# Patient Record
Sex: Male | Born: 1960 | Race: White | Hispanic: No | Marital: Single
Health system: Southern US, Community
[De-identification: ages and names within clinical notes are randomized; demographics above are authoritative.]

## PROBLEM LIST (undated history)

## (undated) DIAGNOSIS — Z89512 Acquired absence of left leg below knee: Principal | ICD-10-CM

## (undated) DIAGNOSIS — Z86711 Personal history of pulmonary embolism: Secondary | ICD-10-CM

## (undated) DIAGNOSIS — E785 Hyperlipidemia, unspecified: Secondary | ICD-10-CM

## (undated) DIAGNOSIS — F419 Anxiety disorder, unspecified: Secondary | ICD-10-CM

## (undated) DIAGNOSIS — F5101 Primary insomnia: Secondary | ICD-10-CM

## (undated) DIAGNOSIS — M21962 Unspecified acquired deformity of left lower leg: Secondary | ICD-10-CM

## (undated) DIAGNOSIS — D6851 Activated protein C resistance: Secondary | ICD-10-CM

## (undated) DIAGNOSIS — Z1211 Encounter for screening for malignant neoplasm of colon: Secondary | ICD-10-CM

## (undated) DIAGNOSIS — I10 Essential (primary) hypertension: Secondary | ICD-10-CM

## (undated) DIAGNOSIS — F32A Depression, unspecified: Secondary | ICD-10-CM

## (undated) DIAGNOSIS — G4730 Sleep apnea, unspecified: Secondary | ICD-10-CM

## (undated) DIAGNOSIS — Q431 Hirschsprung's disease: Secondary | ICD-10-CM

## (undated) DIAGNOSIS — I82409 Acute embolism and thrombosis of unspecified deep veins of unspecified lower extremity: Secondary | ICD-10-CM

## (undated) DIAGNOSIS — R41844 Frontal lobe and executive function deficit: Secondary | ICD-10-CM

## (undated) DIAGNOSIS — M1990 Unspecified osteoarthritis, unspecified site: Secondary | ICD-10-CM

## (undated) DIAGNOSIS — F329 Major depressive disorder, single episode, unspecified: Secondary | ICD-10-CM

## (undated) DIAGNOSIS — T07XXXA Unspecified multiple injuries, initial encounter: Secondary | ICD-10-CM

## (undated) DIAGNOSIS — A4902 Methicillin resistant Staphylococcus aureus infection, unspecified site: Secondary | ICD-10-CM

## (undated) DIAGNOSIS — M25872 Other specified joint disorders, left ankle and foot: Secondary | ICD-10-CM

## (undated) HISTORY — DX: Sleep apnea, unspecified: G47.30

## (undated) HISTORY — DX: Essential (primary) hypertension: I10

## (undated) HISTORY — DX: Hirschsprung's disease: Q43.1

## (undated) HISTORY — PX: KNEE ARTHROPLASTY: SHX992

## (undated) HISTORY — DX: Major depressive disorder, single episode, unspecified: F32.9

## (undated) HISTORY — PX: HIP SURGERY: SHX245

## (undated) HISTORY — DX: Depression, unspecified: F32.A

## (undated) HISTORY — PX: COLONOSCOPY: SHX174

## (undated) HISTORY — DX: Unspecified multiple injuries, initial encounter: T07.XXXA

## (undated) HISTORY — PX: KNEE ARTHROSCOPY: SUR90

## (undated) HISTORY — PX: COLON SURGERY: SHX602

---

## 2007-03-07 ENCOUNTER — Ambulatory Visit (HOSPITAL_BASED_OUTPATIENT_CLINIC_OR_DEPARTMENT_OTHER): Admission: RE | Admit: 2007-03-07 | Discharge: 2007-03-07 | Payer: Self-pay | Admitting: Orthopaedic Surgery

## 2007-04-18 ENCOUNTER — Ambulatory Visit (HOSPITAL_BASED_OUTPATIENT_CLINIC_OR_DEPARTMENT_OTHER): Admission: RE | Admit: 2007-04-18 | Discharge: 2007-04-18 | Payer: Self-pay | Admitting: Orthopaedic Surgery

## 2007-07-31 ENCOUNTER — Inpatient Hospital Stay (HOSPITAL_COMMUNITY): Admission: RE | Admit: 2007-07-31 | Discharge: 2007-08-03 | Payer: Self-pay | Admitting: Orthopedic Surgery

## 2010-06-09 NOTE — Discharge Summary (Signed)
NAMEIMRAN, NUON                ACCOUNT NO.:  [id number]   MEDICAL RECORD NO.:  [id number]          PATIENT TYPE:  INP   LOCATION:  1617                         FACILITY:  [facility]   PHYSICIAN:  Madlyn Frankel. Charlann Boxer, M.D.  DATE OF BIRTH:  09/10/1960   DATE OF ADMISSION:  07/31/2007  DATE OF DISCHARGE:                               DISCHARGE SUMMARY   ADMISSION DIAGNOSES:  1. Osteoarthritis.  2. Factor V deficiency three.  3. Hypertension.  4. Hypercholesteremia.   DISCHARGE DIAGNOSIS:  1. Osteoarthritis.  2. Factor V deficiency.  3. Hypertension.  4. Hypercholesteremia.   HISTORY OF PRESENT ILLNESS:  [name] is a 50 year old male with a  history of bilateral knee pain secondary to osteoarthritis.  It is  refractory to all conservative treatments including oral anti-  inflammatories, cortisone injection and knee arthroscopic surgery.  He  does have a significant history of factor V deficiency and he has been  on chronic Coumadin.   CONSULTATIONS:  None.   PROCEDURE:  Bilateral total knee arthroplasty by surgeon Dr. Durene Romans.  Assistant [employer] PA-C.   LABORATORY DATA:  CBC upon discharge:  His hemoglobin was 11.2,  hematocrit 33, platelets 186.  Chemistries:  Sodium 136, potassium 4.1,  creatinine 0.76, glucose 120.  INR 1.8.  All these lab results from the  8th for CBC and CMET and on the 9th for his INR.  Calcium was 8.5 prior  to discharge.   RADIOLOGY:  Chest two-view showed mild chronic bronchitic changes.  No  active chest disease.   CARDIOLOGY:  No EKG found on chart.   HOSPITAL COURSE:  The patient admitted to the hospital, underwent  bilateral total knee replacement, admitted to orthopedic floor.  He had  increasing quad pain on day 1.  We switched his pain meds over from a  PCA which he had been on the night before to OxyContin as well as  oxycodone and gave him some Valium to help with muscle spasm as well as  anxiety.  This seemed to help tremendously.   He remained afebrile  throughout his course of stay.  Hemodynamically he did have a drop in  his hemoglobin but was stable and did not have any hypotensive  complications during his physical therapy.  He made a little bit of  progress with physical therapy and is very motivated to do well.  Seen  on day 3, his dressing were changed.  He had some swelling appropriate  at this time but no sign of infection with increased quad pain.  Otherwise was doing well with his physical therapy, motivated to get  better, with no sign of infection, afebrile, hemodynamically stable and  neurovascularly intact of his bilateral lower extremities.   DISCHARGE DISPOSITION:  Discharged to skilled nursing facility rehab in  stable and improved condition.   Discharge physical therapy:  Goals of physical therapy are range of  motion 0-100 at 2 weeks and 0-120 at 6 weeks.  Want to minimize pain,  maximize strength, encourage independence in activities of daily living.  He is on the use  a rolling walker for 2-3 weeks until he is able to  utilize a cane for ambulation.   DISCHARGE DIET:  Regular as tolerated by the patient.   DISCHARGE WOUND CARE:  Keep wounds dry.  If he would like to shower,  wrap with plastic, tape edges.   DISCHARGE MEDICATIONS:  1. Coumadin, maintain INR between 2 to 3.  2. Robaxin 500 mg one p.o. q.6 h. muscle spasm pain p.r.n.  3. Valium 5 mg one p.o. q.8 h. p.r.n. muscle spasm pain in      substitution for Robaxin, can give at night to help with sleep.  4. Iron 325 mg one p.o. t.i.d. x2 weeks as tolerated by the patient.      He does have a history of resection, may not be able to tolerate.  5. OxyContin IR 10 mg one p.o. b.i.d.  6. Oxycodone 5 mg one to three p.o. q.3-4 h. p.r.n. pain.  7. Amlodipine 5 mg p.o. daily.  8. Abilify 5 mg p.o. daily.  9. Benicar 20 mg p.o. daily.  10.Seroquel 150 mg p.o. nightly.  11.Zocor 40 mg p.o. daily.  12.Effexor 75 mg p.o. daily.   DISCHARGE  FOLLOWUP:  Follow up with Dr. Charlann Boxer at phone number [phone] in  2 weeks for wound check.     ______________________________  Yetta Glassman. [address], [place]      Madlyn Frankel. Charlann Boxer, M.D.  Electronically Signed    BLM/MEDQ  D:  08/03/2007  T:  08/03/2007  Job:  [id number]

## 2010-06-09 NOTE — Op Note (Signed)
[name], [name]                ACCOUNT NO.:  [id number]   MEDICAL RECORD NO.:  [id number]          PATIENT TYPE:  AMB   LOCATION:  DSC                          FACILITY:  [facility]   PHYSICIAN:  Vanita Panda. Magnus Ivan, M.D.DATE OF BIRTH:  Jan 04, 1961   DATE OF PROCEDURE:  04/18/2007  DATE OF DISCHARGE:                               OPERATIVE REPORT   PREOPERATIVE DIAGNOSIS:  Internal derangement, left knee.   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   PROCEDURE:  Left knee arthroscopy with debridement and chondroplasty of  medial femoral condyle, trochlea and patella.   SURGEON:  Vanita Panda. Magnus Ivan, MD   ANESTHESIA:  General.   BLOOD LOSS:  Minimal.   COMPLICATIONS:  None.   INDICATIONS:  Briefly, Mr. Espin is a 50 year old gentleman who is  morbidly obese.  He has had previous arthroscopic surgery several years  ago on his left knee.  He continued to have pain in this knee with  documented severe arthritis of his right knee.  We are trying to  temporize him for the need for any kind of total knee surgery.  His knee  pain has been worse on the left side.  We have tried injections in this  to help things calm down, and he is at the point where he is requesting  of further surgery.  The risks and benefits were explained to him at  length and he was having mechanical symptoms, and it was felt that we  could proceed along with a knee arthroscopy.Marland Kitchen   PROCEDURE DESCRIPTION:  After informed consent was obtained, the  appropriate left leg was marked.  He was brought to the operating room,  placed supine on the operating table.  General anesthesia was then  obtained.  A nonsterile tourniquet was placed around his upper left  thigh but was never utilized during the case.  His left leg was prepped  and draped with DuraPrep and sterile drapes including a sterile  stockinette.  With the bed raised, a lateral leg post  was utilized and  the leg was flexed off the side of the table.  A time-out was called and  used to identify the correct patient and correct extremity.  I then made  anterior lateral portal just lateral to the patellar tendon at the level  of the inferior pole of the patella and the arthroscope was inserted.  A  large effusion was encountered.  I went directly to the medial side and  made an anterior medial arthroscopy portal.  Through this portal I was  able to probe the medial compartment and under direct visualization we  could see that he had previous surgery on this knee and there was a  large cartilage deficit on the medial femoral condyle.  There appeared  to be fibrinous tissue from previous surgery.  I was able to place an  arthroscopic shaver and debride this to free margins.  The meniscus was  probed itself and it did have its posterior horn attached and the  meniscus itself was intact, so I left  this alone.  The ACL was intact as  well as the lateral compartment of his knee.  When I did go to the  trochlear groove, there were free areas of cartilage that were friable  on the trochlear groove and patella, and I used an arthroscopic shaver  to debride these back to stable margins as well.  I then allowed fluid  to lavage the knee and I closed the portal sites with interrupted 4-0  nylon suture after removing the effusion from the knee.  I then inserted  a mixture of Marcaine and morphine in the knee, which he tolerated well.  Each portal site was closed again with a 4-0 nylon suture,  Xeroform followed by a well-padded sterile dressing and ice pack was  placed on the knee.  The patient was awakened, extubated, and taken to  the recovery room in stable condition.  All final counts were correct  and there were no complications noted.      Vanita Panda. Magnus Ivan, M.D.  Electronically Signed     CYB/MEDQ  D:  04/18/2007  T:  04/18/2007  Job:  [id number]

## 2010-06-09 NOTE — Op Note (Signed)
NAMEPERRI, LAMAGNA                ACCOUNT NO.:  [id number]   MEDICAL RECORD NO.:  [id number]          PATIENT TYPE:  AMB   LOCATION:  DSC                          FACILITY:  [facility]   PHYSICIAN:  Vanita Panda. Magnus Ivan, M.D.DATE OF BIRTH:  27-Sep-1960   DATE OF PROCEDURE:  03/07/2007  DATE OF DISCHARGE:                               OPERATIVE REPORT   PREOPERATIVE DIAGNOSIS:  Severe right knee pain and known  chondromalacia.   POSTOPERATIVE DIAGNOSIS:  Grade 4 chondromalacia of patella and grade 4  chondromalacia medial compartment, large effusion, right knee.   PROCEDURE:  Right knee arthroscopy with extensive debridement and  chondroplasty patella and medial femoral condyle.   SURGEON:  Vanita Panda. Magnus Ivan, M.D.   ANESTHESIA:  General.   ESTIMATED BLOOD LOSS:  Minimal.   COMPLICATIONS:  None.   INDICATIONS:  Briefly, Mr. Norman is a 50 year old obese gentleman who  has had previous knee arthroscopies involving his right knee.  It has  been over a year since his last arthroscopy.  I have tried steroid  injections in the knee as well as supplemental injections of high uronic  acid.  He has lost weight, as well, but he is still considerably obese.  He still has significant pain in his knee and developed an effusion over  the last several months. At age 50 and with his weight, I am still  concerned about total knee arthroplasty and we felt if there is a way we  could temporize this with arthroscopic surgery, we would try one  additional arthroscopy. The risks and benefits of this were explained to  him in length and he agreed to proceed with surgery.   DESCRIPTION OF PROCEDURE:  After informed consent was obtained, the  appropriate right leg was marked.  He was brought to the operating room  and placed supine on the operating table.  General anesthesia was then  obtained. Of note, prior to starting, he did ask for a steroid injection  in his left knee and I did get him to  sign an additional consent for a  steroid injection in his left knee.  Prior to prepping his right knee  out, I did clean the anterior lateral aspect of his left knee and  provide an injection 1 mL of Depo-Medrol and 4 mL of Sensorcaine and  placed a Band-Aid over this wound.  We then prepped and draped his right  operative leg with DuraPrep and sterile drapes including a sterile  stockinette and a lateral leg post. With the bed raised, his leg was  flexed off the side table.  A time-out was called and he was identified  as the correct patient and correct extremity.  I then made an  anterolateral portal just lateral to the patellar tendon at the level of  the inferior pole patella.  The arthroscopic cannula was inserted and a  large effusion was drained from the knee.  I then put the arthroscope  into the knee and was able to take a tour of the knee.  Right away with  the knee extended under the patella,  you could see there was exposed  cartilage.  There was inflamed synovial tissue all up in the superior  patellar space as well as on the medial aspect of his knee.  There was  grade 3 to grade 4 chondromalacia of the medial femoral condyle.  The  medial meniscus was actually intact with just some frayed edging.  The  lateral compartment was pristine and intact.  The ACL was intact, as  well.  I then made an anteromedial portal and an arthroscopic shaver was  inserted and a thorough debridement was carried out with the under  surface of the patella as well as the medial compartment and the medial  femoral condyle and the frayed edges of the medial meniscus.  These were  all debrided extensively. All instrumentation was removed and I allowed  fluid to lavage through the knee.  I then drained the knee of effusion  and inserted a mixture of Marcaine and morphine into the knee in the  portal sites.  Each portal site was closed with interrupted 4-0 nylon  sutures.  Xeroform followed by a well  padded sterile dressing was  applied.  The patient was awakened, extubated, and taken to the recovery  room in stable condition.      Vanita Panda. Magnus Ivan, M.D.  Electronically Signed     CYB/MEDQ  D:  03/07/2007  T:  03/09/2007  Job:  [id number]

## 2010-06-09 NOTE — H&P (Signed)
[name], [name]                ACCOUNT NO.:  [id number]   MEDICAL RECORD NO.:  [id number]          PATIENT TYPE:  INP   LOCATION:  NA                           FACILITY:  [facility]   PHYSICIAN:  [name], M.D.  DATE OF BIRTH:  10-Nov-1960   DATE OF ADMISSION:  07/31/2007  DATE OF DISCHARGE:                              HISTORY & PHYSICAL   PROCEDURES:  Bilateral total knee arthroplasty.   CHIEF COMPLAINT:  Bilateral knee pain.   HISTORY OF PRESENT ILLNESS:  A 50 year old male with a history of  bilateral knee pain secondary to osteoarthritis.  It has been refractory  to all conservative treatments, including oral anti-inflammatories,  cortisone injection and knee arthroscopic surgery.  He does have a  significant history of Factor V deficiency and he is on Coumadin.  He  will plan on stopping this 5 days prior to surgery.   PRIMARY CARE PHYSICIAN:  [name], M.D.   PAST MEDICAL HISTORY:  Significant for:  1. Osteoarthritis.  2. Factor V deficiency.  3. Hypertension.  4. Hypercholesteremia.   PAST SURGICAL HISTORY:  Includes 5 knee scopes, right ankle cyst removed  in 1998, Hirschsprung's colon surgery in 1963.   FAMILY MEDICAL HISTORY:  Coronary artery disease.   SOCIAL HISTORY:  He is single, disabled and is planning on going to  [facility] in [place].  See plan for contact  person and number.   DRUG ALLERGIES:  NO KNOWN DRUG ALLERGIES.   CURRENT MEDICATIONS:  Seroquel, Coumadin, Lipitor, Lovaza, Exforge, and  __________.  Please verify dosage and frequency with patient at time of  admission.   REVIEW OF SYSTEMS:  None other than HPI.   PHYSICAL EXAMINATION:  Pulse 72, respirations 18, blood pressure 110/74.  GENERAL:  Awake, alert and oriented, well-developed, well-nourished.  NECK:  Supple.  No carotid bruits.  CHEST:  Lungs are clear to auscultation bilaterally.  BREASTS:  Deferred.  HEART:  Regular rate and rhythm.  S1 and S2  distinct.  ABDOMEN:  Soft and nontender, nondistended.  Bowel sounds present.  GENITOURINARY:  Deferred.  EXTREMITIES:  Bilateral knee with anterior tenderness.  Ligaments  stable.  SKIN:  No cellulitis.  Dorsalis pedis pulse positive, bilateral lower  extremities.  NEUROLOGIC:  Intact distal sensibilities.   Labs, EKG, chest x-ray all pending presurgical testing.   IMPRESSION:  Bilateral knee osteoarthritis.   PLAN OF ACTION:  Bilateral total knee arthroplasty July 31, 2007 at  [facility] by surgeon, Dr. Durene Romans.  Risks and  complications were discussed.   The patient is planning to go to rehab at [facility] in  [place].  Contact person is [employer]  Telephone number is [phone],  FAX number [phone].     ______________________________  [name], [place]      [name], M.D.  Electronically Signed    BLM/MEDQ  D:  07/21/2007  T:  07/21/2007  Job:  [id number]   cc:   [name], M.D.  Fax: [phone]

## 2010-06-09 NOTE — Op Note (Signed)
[name], [name]                ACCOUNT NO.:  [id number]   MEDICAL RECORD NO.:  [id number]          PATIENT TYPE:  INP   LOCATION:  1617                         FACILITY:  [facility]   PHYSICIAN:  Madlyn Frankel. Charlann Boxer, M.D.  DATE OF BIRTH:  1960/10/04   DATE OF PROCEDURE:  07/31/2007  DATE OF DISCHARGE:                               OPERATIVE REPORT   PREOPERATIVE DIAGNOSIS:  Bilateral knee osteoarthritis.   POSTOPERATIVE DIAGNOSIS:  Bilateral knee osteoarthritis.   PROCEDURE:  Bilateral total knee replacement.   COMPONENTS USED:  DePuy rotating platform, posterior stabilized knee  system, both knees.  The right and the left knee were both a size 5  femur, 5 tibia, 10 mm posterior stabilized insert and a 41 patellar  button.   SURGEON:  Madlyn Frankel. Charlann Boxer, M.D.   ASSISTANT:  Yetta Glassman. Mann, PA.   ANESTHESIA:  Duramorph spinal.   DRAINS:  One drain per knee for a total of two drains.   TOURNIQUET TIME:  48 minutes at 300 mmHg for the left knee and 51  minutes at 300 mmHg for the right knee.   COMPLICATIONS:  None.   INDICATIONS FOR PROCEDURE:  [name] is a 50 year old gentleman who was  referred for evaluation of bilateral knee osteoarthritis, failing  conservative measures, including injections and surgical procedures in  the past.  Based on his current social situation and the appropriate  timing for surgery, it was chosen to be bilateral total knee  replacements.  We reviewed the risks and benefits, increased risks and  medical complications.  Given his age and relative health, he was a good  candidate.  We reviewed the risks and benefits of a total knee  replacement versus other options, including partial knee replacement.  He wished to proceed with a total knee replacement.  Consent was  obtained.   PROCEDURE IN DETAIL:  The patient was brought to the operative theater.  Once adequate anesthesia and preoperative antibiotics, which included 2  gm of Ancef were  administered.  The patient was positioned supine.  Both  legs were prepped and draped over nonsterile thigh tourniquet.   As predetermined, the left lower extremity was attended to first.  The  left lower extremity was exsanguinated, tourniquet elevated to 300 mmHg.  A midline incision was made followed by a median arthrotomy.  Following  initial debridement, attention was directed to the patella.  Precut  measurement was 26 mm.  I resected down to 14-15 mm and used a 41  patellar button.   We placed the button in the drilled peg hole placed to prevent damage to  the patella during preparation of the remaining cuts.  Following further  debridement and exposure, I opened up the femoral canal and irrigated  fat emboli.  Intramedullary rod was then passed at 5 degrees valgus.  I  resected 10 mm of bone off the distal femur.   At this point using the extramedullary device, I resected 10 mm of bone  off the proximal lateral tibia.  I then checked with my extension block  and was happy that  the knee came out to full extension and with the  ligaments appearing to be balanced.   At this point, we sized the femur to be a size 5.  I then used a  baseline rotation of the femoral cutting block based on the cut surface  of the proximal tibia with the intramedullary rod in place and this  anterior stylus to determine its anterior location to prevent notching.  I then placed the appropriate shim to set my rotation for the femoral  component.  Once it was set with immediate 90 degrees of flexion, I  pinned it in placed.  I then used the final headed pins and drilled  these into place.  Anterior and posterior chamfer cuts were all then  made without difficulty, no notching.  I went ahead and made the final  box cut off the lateral aspect of distal femur.  Attention was  redirected back to the tibia now with the tibia subluxated anteriorly.  I chose a size 5 tibial component which fit best.  I did  check the  alignment and was happy that the cut was perpendicular in both planes.  I drilled and keel punched the size 5 tibial tray.  Trial reduction was  now carried out with 5 femur, 5 tibia and 10 mm insert.  The patella  tracked well and reapplication of pressure, the ligaments appeared to be  balanced from extension to flexion.  At this point, all trial components  were removed.  Final debridement was carried out as necessary.  I  injected the synovial layer with 30 mL of is of Marcaine with  epinephrine.  I did perform a synovectomy on the anterior aspect of the  knee.  The knee was then irrigated with normal saline solution and pulse  lavage.  Final components were opened and cement mixed.  Components were  cemented into position.  The knee was brought out to extension with a 10  mm insert and patella clamp in place.  Extruded cement was removed.  Once the cement had cured, excess cement was debrided throughout the  knee.  Once I was satisfied I was unable to visualize any free cement,  the final 10 mm insert was placed.  The knee was reirrigated at this  point.  A medium Hemovac drain was placed in the suprapatellar pouch.  I  then reapproximated the extensor mechanism with the knee in flexion with  #1 Vicryl.  The remaining wound was closed with 2-0 Vicryl and running 4-  0 Monocryl.   This knee was then temporarily dressed with a couple dressing sponges  that were accounted for at the end of the case and an Ace wrap.  While  the wound was closed, I attempted the right knee.   The right total knee replacement was carried out in the same fashion.  The leg was exsanguinated, tourniquet elevated.  A midline incision was  made followed by a median arthrotomy.  Following initial debridement,  attention was directed to patella.  On this side, precut measure was 23  mm.  I resected down to 14-15 mm and again used a 41 patellar button.  Now, this got my height back to 26 mm.  I then  attempted the femur.  The  femoral canal was opened with a drill and irrigated to prevent fat  emboli.  Intramedullary rod was passed and at 5 degrees of valgus, I  resected 10 mm of bone.   The proximal tibia was  then cut next.  Again subluxating anteriorly, I  resected 10 mm of bone off the proximal lateral tibia.  I checked with  an extension block and was happy with the extension and the ligaments  were balanced.   At this point, the femoral rotation again was set based off the proximal  tibia.  Once I had placed the intramedullary rod and determined based on  the anterior stylus the position of the anterior and posterior  direction, then it was locked down and the rotation was set based off  the perpendicular to the proximal tibia.  I then used headed pins to get  this into its correct position.  The anterior and posterior chamfer cuts  were then made without difficulty or complication.   The final box cut was made based off the lateral aspect of the distal  femur.  Attention was now redirected to the tibia.  Again, the cut  surface was best fit with a size 5 tibial component.  It was pinned into  position and alignment was checked in positioned.  I then drilled and  keel punched the tibia.  At this point, trial reduction was carried out  with a 10 mm insert and the knee came out to full extension with stable  ligaments from extension to flexion, the patella tracking well without  application of thumb pressure.   All trial components were removed and we again did final debridements,  irrigated the knee with normal saline solution.  I injected the synovial  capsule junction with 30 mL of quarter-percent Marcaine with  epinephrine.  Synovectomy carried out in the anterior pouch.  The final  components were opened, cement mixed.  The final components were  cemented into position and the knee was balanced in extension with a 10  mm insert.  Once the cement cured, we checked the  remainder part of the  knee.  I was unable to find any loose fragments of cement.  Once this  was done, the final 10 mm insert was place.  The knee was irrigated  throughout the case and again at this point with pulse lavage.  A medium  Hemovac drain was placed.  We reapproximated the extensor mechanism with  #1 Vicryl, with the knee in flexion.  The remaining wound was closed  with 2-0 Vicryl and running 4-0 Monocryl.  At this point, both knees  were dressed sterilely with Steri-Strips and sterile bulky Jones  dressing.  He was brought to the recovery room in stable condition  having tolerated the procedure well without difficulty.      Madlyn Frankel Charlann Boxer, M.D.  Electronically Signed     MDO/MEDQ  D:  07/31/2007  T:  07/31/2007  Job:  [id number]

## 2010-10-16 LAB — BASIC METABOLIC PANEL
BUN: 12
CO2: 27
Glucose, Bld: 110 — ABNORMAL HIGH
Potassium: 4
Sodium: 140

## 2010-10-16 LAB — APTT: aPTT: 33

## 2010-10-16 LAB — POCT HEMOGLOBIN-HEMACUE: Hemoglobin: 15.2

## 2010-10-19 LAB — POCT I-STAT, CHEM 8
Chloride: 104
HCT: 45
Potassium: 4.2
Sodium: 141

## 2010-10-22 LAB — PROTIME-INR
INR: 0.9
INR: 1
INR: 1.8 — ABNORMAL HIGH
Prothrombin Time: 12.6
Prothrombin Time: 13.5
Prothrombin Time: 17 — ABNORMAL HIGH
Prothrombin Time: 21.7 — ABNORMAL HIGH

## 2010-10-22 LAB — BASIC METABOLIC PANEL
BUN: 19
BUN: 11
BUN: 18
CO2: 28
Calcium: 8.4
Creatinine, Ser: 0.65
Creatinine, Ser: 0.76
GFR calc Af Amer: >60
GFR calc non Af Amer: >60
GFR calc non Af Amer: >60
Glucose, Bld: 144 — ABNORMAL HIGH
Potassium: 4.5
Sodium: 140

## 2010-10-22 LAB — TYPE AND SCREEN
ABO/RH(D): A POS
Antibody Screen: NEGATIVE

## 2010-10-22 LAB — URINALYSIS, ROUTINE W REFLEX MICROSCOPIC
Ketones, ur: NEGATIVE
Nitrite: NEGATIVE
Specific Gravity, Urine: 1.014
pH: 5.5

## 2010-10-22 LAB — CBC
HCT: 46.1
MCHC: 33.6
MCV: 91.8
Platelets: 238
Platelets: 186
RDW: 13.4
RDW: 13.5
WBC: 8.7
WBC: 10.8 — ABNORMAL HIGH

## 2010-10-22 LAB — DIFFERENTIAL
Eosinophils Relative: 1
Lymphocytes Relative: 21
Lymphs Abs: 1.8

## 2013-11-02 ENCOUNTER — Other Ambulatory Visit: Payer: Self-pay | Admitting: Orthopedic Surgery

## 2013-11-02 DIAGNOSIS — M25852 Other specified joint disorders, left hip: Secondary | ICD-10-CM

## 2013-11-14 ENCOUNTER — Other Ambulatory Visit: Payer: Self-pay | Admitting: Orthopedic Surgery

## 2013-11-14 DIAGNOSIS — Z139 Encounter for screening, unspecified: Secondary | ICD-10-CM

## 2013-11-16 ENCOUNTER — Ambulatory Visit
Admission: RE | Admit: 2013-11-16 | Discharge: 2013-11-16 | Disposition: A | Discharge status: Home / Self Care | Payer: Self-pay | Source: Ambulatory Visit | Attending: Orthopedic Surgery | Admitting: Orthopedic Surgery

## 2013-11-16 DIAGNOSIS — M25852 Other specified joint disorders, left hip: Secondary | ICD-10-CM

## 2013-11-16 DIAGNOSIS — Z139 Encounter for screening, unspecified: Secondary | ICD-10-CM

## 2013-11-16 MED ORDER — IOHEXOL 180 MG/ML  SOLN
15.0000 mL | Freq: Once | INTRAMUSCULAR | Status: AC | PRN
  Administered 2013-11-16: 15 mL via INTRA_ARTICULAR

## 2016-01-13 ENCOUNTER — Ambulatory Visit (INDEPENDENT_AMBULATORY_CARE_PROVIDER_SITE_OTHER): Payer: Self-pay | Admitting: Orthopaedic Surgery

## 2016-02-10 ENCOUNTER — Ambulatory Visit (INDEPENDENT_AMBULATORY_CARE_PROVIDER_SITE_OTHER): Payer: Self-pay | Admitting: Orthopaedic Surgery

## 2016-02-26 DIAGNOSIS — D6851 Activated protein C resistance: Secondary | ICD-10-CM | POA: Diagnosis not present

## 2016-02-26 DIAGNOSIS — I10 Essential (primary) hypertension: Secondary | ICD-10-CM | POA: Diagnosis not present

## 2016-02-26 DIAGNOSIS — I2699 Other pulmonary embolism without acute cor pulmonale: Secondary | ICD-10-CM | POA: Diagnosis not present

## 2016-02-26 DIAGNOSIS — E7800 Pure hypercholesterolemia, unspecified: Secondary | ICD-10-CM | POA: Diagnosis not present

## 2016-02-26 DIAGNOSIS — I82409 Acute embolism and thrombosis of unspecified deep veins of unspecified lower extremity: Secondary | ICD-10-CM | POA: Diagnosis not present

## 2016-02-26 DIAGNOSIS — Z7901 Long term (current) use of anticoagulants: Secondary | ICD-10-CM | POA: Diagnosis not present

## 2016-02-26 DIAGNOSIS — K589 Irritable bowel syndrome without diarrhea: Secondary | ICD-10-CM | POA: Diagnosis not present

## 2016-03-09 ENCOUNTER — Ambulatory Visit (INDEPENDENT_AMBULATORY_CARE_PROVIDER_SITE_OTHER): Payer: PPO

## 2016-03-09 ENCOUNTER — Ambulatory Visit (INDEPENDENT_AMBULATORY_CARE_PROVIDER_SITE_OTHER): Payer: PPO | Admitting: Orthopaedic Surgery

## 2016-03-09 ENCOUNTER — Encounter (INDEPENDENT_AMBULATORY_CARE_PROVIDER_SITE_OTHER): Payer: Self-pay | Admitting: Orthopaedic Surgery

## 2016-03-09 VITALS — Ht 71.0 in | Wt 320.0 lb

## 2016-03-09 DIAGNOSIS — Z96651 Presence of right artificial knee joint: Secondary | ICD-10-CM | POA: Diagnosis not present

## 2016-03-09 DIAGNOSIS — M25561 Pain in right knee: Secondary | ICD-10-CM | POA: Diagnosis not present

## 2016-03-09 DIAGNOSIS — M25572 Pain in left ankle and joints of left foot: Secondary | ICD-10-CM

## 2016-03-09 DIAGNOSIS — G8929 Other chronic pain: Secondary | ICD-10-CM

## 2016-03-09 MED ORDER — LIDOCAINE HCL 1 % IJ SOLN
2.0000 mL | INTRAMUSCULAR | Status: AC | PRN
  Administered 2016-03-09: 2 mL

## 2016-03-09 MED ORDER — METHYLPREDNISOLONE ACETATE 40 MG/ML IJ SUSP
40.0000 mg | INTRAMUSCULAR | Status: AC | PRN
  Administered 2016-03-09: 40 mg via INTRA_ARTICULAR

## 2016-03-09 NOTE — Progress Notes (Signed)
Office Visit Note   Patient: [name]           Date of Birth: 03-Jul-1960           MRN: [id number] Visit Date: 03/09/2016              Requested by: Simone Curia, MD [address] [place], [place] [address] PCP: Simone Curia, MD   Assessment & Plan: Visit Diagnoses:  1. Pain in left ankle and joints of left foot   2. Chronic pain of right knee   3. History of total right knee replacement     Plan: He tolerated the steroid injection well on his left ankle. I will try an ASO from the ankles well. He still needs work on weight loss. We will have him try quad strengthening exercises on his own twice daily significant his quads stronger. I'll repeat of his evaluation in 4 weeks from now. I did show him a knee replacement model and his x-rays in detail. I think worst-case noted via polyliner exchange if he really needs it the knee does not feel unstable to me on exam.  Follow-Up Instructions: Return in about 4 weeks (around 04/06/2016).   Orders:  Orders Placed This Encounter  Procedures  . Medium Joint Injection/Arthrocentesis  . XR Knee 1-2 Views Right  . XR Ankle Complete Left   No orders of the defined types were placed in this encounter.     Procedures: Medium Joint Inj Date/Time: 03/09/2016 10:25 AM Performed by: Kathryne Hitch Authorized by: Kathryne Hitch   Location:  Ankle Approach:  Anterolateral Ultrasound Guided: No   Fluoroscopic Guidance: No   Medications:  2 mL lidocaine 1 %; 40 mg methylPREDNISolone acetate 40 MG/ML     Clinical Data: No additional findings.   Subjective: Chief Complaint  Patient presents with  . Right Knee - Pain    Chronic right knee pain. Hx LTHA at [facility] 2015. Patient had infection post op and ambulated with assistive device x 2 yrs. Knee giving way, hx LTKA 09' w/[facility].   . Left Ankle - Pain    Left ankle fx July 2017. Saw local orthopedic in [place]. Patient was placed in fx boot, he feels his ankle  has healed incorrectly. Pain with wtb, decreased ROM.    Both his knees replaced by Dr. Charlann Boxer in 2009. He said recently his right knee is giving way on him a couple times and will this checked out. He's also had previous ankle problems. 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  Objective: Vital Signs: Ht 5\' 11"  (1.803 m)   Wt (!) 320 lb (145.2 kg)   BMI 44.63 kg/m   Physical Exam He is alert and oriented 3 in no acute distress. He walks with assistive device. He is overweight. Ortho Exam Maurine Minister his right knee shows full range of motion actively and passively. This knee is not red nor is it warm. This is any effusion. His knee feels ligaments stable. Examination of his ankle shows anterolateral swelling but good range of motion overall. The ankle is ligamentously stable. He is neurovascularly intact. Specialty Comments:  No specialty comments available.  Imaging: Xr Ankle Complete Left  Result Date: 03/09/2016 Reviews his left ankle show well located ankle. There is a calcification  off the anterior lateral aspect of the ankle suggesting an old injury. There is otherwise an intact ankle mortise in no acute fracture or signs of injury  Xr Knee 1-2 Views Right  Result Date: 03/09/2016 An AP and lateral of the right knee shows a well-seated total knee replacement. There is no evidence of loosening or significant wear. The alignment is well maintained.    PMFS History: Patient Active Problem List   Diagnosis Date Noted  . History of total right knee replacement 03/09/2016  . Pain in left ankle and joints of left foot 03/09/2016   Past Medical History:  Diagnosis Date  . Depression   . Fractures   . Hirschsprung's disease   . Hypertension   . Sleep apnea     History reviewed. No  pertinent family history.  Past Surgical History:  Procedure Laterality Date  . HIP SURGERY Left    [facility]  . KNEE ARTHROSCOPY     Social History   Occupational History  . Not on file.   Social History Main Topics  . Smoking status: Current Every Day Smoker    Types: Cigarettes  . Smokeless tobacco: Never Used  . Alcohol use Not on file  . Drug use: Unknown  . Sexual activity: Not on file

## 2016-03-29 DIAGNOSIS — N509 Disorder of male genital organs, unspecified: Secondary | ICD-10-CM | POA: Diagnosis not present

## 2016-03-29 DIAGNOSIS — K589 Irritable bowel syndrome without diarrhea: Secondary | ICD-10-CM | POA: Diagnosis not present

## 2016-03-29 DIAGNOSIS — I10 Essential (primary) hypertension: Secondary | ICD-10-CM | POA: Diagnosis not present

## 2016-03-29 DIAGNOSIS — F419 Anxiety disorder, unspecified: Secondary | ICD-10-CM | POA: Diagnosis not present

## 2016-03-29 DIAGNOSIS — D6851 Activated protein C resistance: Secondary | ICD-10-CM | POA: Diagnosis not present

## 2016-03-29 DIAGNOSIS — F17201 Nicotine dependence, unspecified, in remission: Secondary | ICD-10-CM | POA: Diagnosis not present

## 2016-03-29 DIAGNOSIS — E291 Testicular hypofunction: Secondary | ICD-10-CM | POA: Diagnosis not present

## 2016-03-29 DIAGNOSIS — E7800 Pure hypercholesterolemia, unspecified: Secondary | ICD-10-CM | POA: Diagnosis not present

## 2016-03-29 DIAGNOSIS — I82409 Acute embolism and thrombosis of unspecified deep veins of unspecified lower extremity: Secondary | ICD-10-CM | POA: Diagnosis not present

## 2016-03-29 DIAGNOSIS — I2699 Other pulmonary embolism without acute cor pulmonale: Secondary | ICD-10-CM | POA: Diagnosis not present

## 2016-03-29 DIAGNOSIS — R609 Edema, unspecified: Secondary | ICD-10-CM | POA: Diagnosis not present

## 2016-03-29 DIAGNOSIS — Z7901 Long term (current) use of anticoagulants: Secondary | ICD-10-CM | POA: Diagnosis not present

## 2016-04-06 ENCOUNTER — Ambulatory Visit (INDEPENDENT_AMBULATORY_CARE_PROVIDER_SITE_OTHER): Payer: PPO | Admitting: Orthopaedic Surgery

## 2016-04-12 DIAGNOSIS — E291 Testicular hypofunction: Secondary | ICD-10-CM | POA: Diagnosis not present

## 2016-04-12 DIAGNOSIS — I2699 Other pulmonary embolism without acute cor pulmonale: Secondary | ICD-10-CM | POA: Diagnosis not present

## 2016-04-12 DIAGNOSIS — K589 Irritable bowel syndrome without diarrhea: Secondary | ICD-10-CM | POA: Diagnosis not present

## 2016-04-12 DIAGNOSIS — D6851 Activated protein C resistance: Secondary | ICD-10-CM | POA: Diagnosis not present

## 2016-04-12 DIAGNOSIS — E8881 Metabolic syndrome: Secondary | ICD-10-CM | POA: Diagnosis not present

## 2016-04-12 DIAGNOSIS — N509 Disorder of male genital organs, unspecified: Secondary | ICD-10-CM | POA: Diagnosis not present

## 2016-04-12 DIAGNOSIS — R609 Edema, unspecified: Secondary | ICD-10-CM | POA: Diagnosis not present

## 2016-04-12 DIAGNOSIS — M159 Polyosteoarthritis, unspecified: Secondary | ICD-10-CM | POA: Diagnosis not present

## 2016-04-12 DIAGNOSIS — Z7901 Long term (current) use of anticoagulants: Secondary | ICD-10-CM | POA: Diagnosis not present

## 2016-04-12 DIAGNOSIS — E7800 Pure hypercholesterolemia, unspecified: Secondary | ICD-10-CM | POA: Diagnosis not present

## 2016-04-12 DIAGNOSIS — I10 Essential (primary) hypertension: Secondary | ICD-10-CM | POA: Diagnosis not present

## 2016-04-12 DIAGNOSIS — I82409 Acute embolism and thrombosis of unspecified deep veins of unspecified lower extremity: Secondary | ICD-10-CM | POA: Diagnosis not present

## 2016-04-26 DIAGNOSIS — K589 Irritable bowel syndrome without diarrhea: Secondary | ICD-10-CM | POA: Diagnosis not present

## 2016-04-26 DIAGNOSIS — I2699 Other pulmonary embolism without acute cor pulmonale: Secondary | ICD-10-CM | POA: Diagnosis not present

## 2016-04-26 DIAGNOSIS — E291 Testicular hypofunction: Secondary | ICD-10-CM | POA: Diagnosis not present

## 2016-04-26 DIAGNOSIS — R609 Edema, unspecified: Secondary | ICD-10-CM | POA: Diagnosis not present

## 2016-04-26 DIAGNOSIS — N509 Disorder of male genital organs, unspecified: Secondary | ICD-10-CM | POA: Diagnosis not present

## 2016-04-26 DIAGNOSIS — E8881 Metabolic syndrome: Secondary | ICD-10-CM | POA: Diagnosis not present

## 2016-04-26 DIAGNOSIS — I82409 Acute embolism and thrombosis of unspecified deep veins of unspecified lower extremity: Secondary | ICD-10-CM | POA: Diagnosis not present

## 2016-04-26 DIAGNOSIS — E7800 Pure hypercholesterolemia, unspecified: Secondary | ICD-10-CM | POA: Diagnosis not present

## 2016-04-26 DIAGNOSIS — D6851 Activated protein C resistance: Secondary | ICD-10-CM | POA: Diagnosis not present

## 2016-04-26 DIAGNOSIS — Z7901 Long term (current) use of anticoagulants: Secondary | ICD-10-CM | POA: Diagnosis not present

## 2016-04-26 DIAGNOSIS — M159 Polyosteoarthritis, unspecified: Secondary | ICD-10-CM | POA: Diagnosis not present

## 2016-04-26 DIAGNOSIS — I10 Essential (primary) hypertension: Secondary | ICD-10-CM | POA: Diagnosis not present

## 2016-05-03 DIAGNOSIS — I82409 Acute embolism and thrombosis of unspecified deep veins of unspecified lower extremity: Secondary | ICD-10-CM | POA: Diagnosis not present

## 2016-05-03 DIAGNOSIS — Z1389 Encounter for screening for other disorder: Secondary | ICD-10-CM | POA: Diagnosis not present

## 2016-05-03 DIAGNOSIS — R609 Edema, unspecified: Secondary | ICD-10-CM | POA: Diagnosis not present

## 2016-05-03 DIAGNOSIS — I10 Essential (primary) hypertension: Secondary | ICD-10-CM | POA: Diagnosis not present

## 2016-05-03 DIAGNOSIS — N509 Disorder of male genital organs, unspecified: Secondary | ICD-10-CM | POA: Diagnosis not present

## 2016-05-03 DIAGNOSIS — I2699 Other pulmonary embolism without acute cor pulmonale: Secondary | ICD-10-CM | POA: Diagnosis not present

## 2016-05-03 DIAGNOSIS — D6851 Activated protein C resistance: Secondary | ICD-10-CM | POA: Diagnosis not present

## 2016-05-03 DIAGNOSIS — E291 Testicular hypofunction: Secondary | ICD-10-CM | POA: Diagnosis not present

## 2016-05-03 DIAGNOSIS — E7800 Pure hypercholesterolemia, unspecified: Secondary | ICD-10-CM | POA: Diagnosis not present

## 2016-05-03 DIAGNOSIS — Z7901 Long term (current) use of anticoagulants: Secondary | ICD-10-CM | POA: Diagnosis not present

## 2016-05-03 DIAGNOSIS — K589 Irritable bowel syndrome without diarrhea: Secondary | ICD-10-CM | POA: Diagnosis not present

## 2016-05-03 DIAGNOSIS — E8881 Metabolic syndrome: Secondary | ICD-10-CM | POA: Diagnosis not present

## 2016-05-04 ENCOUNTER — Ambulatory Visit (INDEPENDENT_AMBULATORY_CARE_PROVIDER_SITE_OTHER): Payer: PPO | Admitting: Orthopaedic Surgery

## 2016-05-04 ENCOUNTER — Other Ambulatory Visit (INDEPENDENT_AMBULATORY_CARE_PROVIDER_SITE_OTHER): Payer: Self-pay

## 2016-05-04 DIAGNOSIS — Z96651 Presence of right artificial knee joint: Secondary | ICD-10-CM | POA: Diagnosis not present

## 2016-05-04 DIAGNOSIS — M25572 Pain in left ankle and joints of left foot: Secondary | ICD-10-CM | POA: Diagnosis not present

## 2016-05-04 NOTE — Progress Notes (Signed)
The patient is continue to follow-up with a painful right total knee replacement was done by another surgeon in town several years ago. The knee feels like it wants to give out on him. We also are seeing him for his left ankle. I provided an intra-articular injection of the left ankle and said that didn't really help a lot. He has a history of remote fracture of ankle and he feels like the ligaments are unstable and it.  As for his left ankle goes he has full range motion ankle but is very painful all in the ankle joint itself. On the x-rays before there is cortical irregularity of the anterior tibia suggesting impingement at the tibiotalar joint. As far as his right knee goes there is no evidence infection and previous x-rays show well-seated implant. The knee feels slightly ligamentously loose.  At this point is far as his right knee goes a feel that he benefit from a polyliner exchange. However we would like to get an MRI of his left ankle first assist cartilage of his ankle and the ligaments given the locking catching of that ankle instability symptoms as well as the failure conservative treatment including rest, ice, heat, time, anti-inflammatories, stretching and strengthening exercises, ankle brace, and a steroid injection. We'll see him back after the MRI of his left ankle we can discuss surgery at that point.

## 2016-05-25 ENCOUNTER — Encounter (INDEPENDENT_AMBULATORY_CARE_PROVIDER_SITE_OTHER): Payer: Self-pay

## 2016-05-25 ENCOUNTER — Ambulatory Visit (INDEPENDENT_AMBULATORY_CARE_PROVIDER_SITE_OTHER): Payer: PPO | Admitting: Orthopaedic Surgery

## 2016-05-26 DIAGNOSIS — E291 Testicular hypofunction: Secondary | ICD-10-CM | POA: Diagnosis not present

## 2016-05-26 DIAGNOSIS — Z7901 Long term (current) use of anticoagulants: Secondary | ICD-10-CM | POA: Diagnosis not present

## 2016-05-26 DIAGNOSIS — I2699 Other pulmonary embolism without acute cor pulmonale: Secondary | ICD-10-CM | POA: Diagnosis not present

## 2016-05-26 DIAGNOSIS — N509 Disorder of male genital organs, unspecified: Secondary | ICD-10-CM | POA: Diagnosis not present

## 2016-05-26 DIAGNOSIS — M65872 Other synovitis and tenosynovitis, left ankle and foot: Secondary | ICD-10-CM | POA: Diagnosis not present

## 2016-05-26 DIAGNOSIS — D6851 Activated protein C resistance: Secondary | ICD-10-CM | POA: Diagnosis not present

## 2016-05-26 DIAGNOSIS — I10 Essential (primary) hypertension: Secondary | ICD-10-CM | POA: Diagnosis not present

## 2016-05-26 DIAGNOSIS — R609 Edema, unspecified: Secondary | ICD-10-CM | POA: Diagnosis not present

## 2016-05-26 DIAGNOSIS — K589 Irritable bowel syndrome without diarrhea: Secondary | ICD-10-CM | POA: Diagnosis not present

## 2016-05-26 DIAGNOSIS — M25572 Pain in left ankle and joints of left foot: Secondary | ICD-10-CM | POA: Diagnosis not present

## 2016-05-26 DIAGNOSIS — F419 Anxiety disorder, unspecified: Secondary | ICD-10-CM | POA: Diagnosis not present

## 2016-05-26 DIAGNOSIS — I82409 Acute embolism and thrombosis of unspecified deep veins of unspecified lower extremity: Secondary | ICD-10-CM | POA: Diagnosis not present

## 2016-05-26 DIAGNOSIS — E8881 Metabolic syndrome: Secondary | ICD-10-CM | POA: Diagnosis not present

## 2016-05-26 DIAGNOSIS — E7800 Pure hypercholesterolemia, unspecified: Secondary | ICD-10-CM | POA: Diagnosis not present

## 2016-05-28 ENCOUNTER — Telehealth (INDEPENDENT_AMBULATORY_CARE_PROVIDER_SITE_OTHER): Payer: Self-pay

## 2016-05-28 NOTE — Telephone Encounter (Signed)
Patient wanted to know if appointment was necessary for him to keep to go over MRI results.  Advised patient that he should keep the appointment to discuss MRI in full detail and to schedule surgery if needed.

## 2016-06-08 ENCOUNTER — Ambulatory Visit (INDEPENDENT_AMBULATORY_CARE_PROVIDER_SITE_OTHER): Payer: PPO | Admitting: Orthopaedic Surgery

## 2016-06-08 DIAGNOSIS — M25572 Pain in left ankle and joints of left foot: Secondary | ICD-10-CM | POA: Diagnosis not present

## 2016-06-08 NOTE — Progress Notes (Signed)
The patient is following up after an MRI of his left ankle due to pain and instability symptoms after a mechanical fall and fracture about a year ago. He was seen elsewhere. He then followed up with me. He's been complaining of ankle foot swelling and giving way. I sent him for an MRI due to cartilage irregularities around the ankle joint itself and do the symptoms he was having. He is someone who is morbidly obese. His ankle is then no.  On exam his ankle he hurts a little anterior lateral aspect of the ankle more so at the joint. There is pain along the peroneal tendons in the posterior tibial tendon but no deficiency of these.  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 There is likely insufficiency of the anterior talofibular ligament. There is tendinitis albeit mild of the peroneal tendons as well as the posterior tibial tendon it.  At this point I would like to send him to my partner Dr. Lajoyce Cornersuda to evaluate him to determine whether or not he needs just an ankle arthroscopy or when he benefit from any surgery to tighten the ligaments around his ankle as well. Certainly I'll leave that up to Dr. Lajoyce Cornersuda. All questions were encouraged and answered.

## 2016-07-01 DIAGNOSIS — D6851 Activated protein C resistance: Secondary | ICD-10-CM | POA: Diagnosis not present

## 2016-07-01 DIAGNOSIS — N509 Disorder of male genital organs, unspecified: Secondary | ICD-10-CM | POA: Diagnosis not present

## 2016-07-01 DIAGNOSIS — I10 Essential (primary) hypertension: Secondary | ICD-10-CM | POA: Diagnosis not present

## 2016-07-01 DIAGNOSIS — E8881 Metabolic syndrome: Secondary | ICD-10-CM | POA: Diagnosis not present

## 2016-07-01 DIAGNOSIS — I2699 Other pulmonary embolism without acute cor pulmonale: Secondary | ICD-10-CM | POA: Diagnosis not present

## 2016-07-01 DIAGNOSIS — F17201 Nicotine dependence, unspecified, in remission: Secondary | ICD-10-CM | POA: Diagnosis not present

## 2016-07-01 DIAGNOSIS — E7800 Pure hypercholesterolemia, unspecified: Secondary | ICD-10-CM | POA: Diagnosis not present

## 2016-07-01 DIAGNOSIS — K589 Irritable bowel syndrome without diarrhea: Secondary | ICD-10-CM | POA: Diagnosis not present

## 2016-07-01 DIAGNOSIS — I82409 Acute embolism and thrombosis of unspecified deep veins of unspecified lower extremity: Secondary | ICD-10-CM | POA: Diagnosis not present

## 2016-07-01 DIAGNOSIS — R609 Edema, unspecified: Secondary | ICD-10-CM | POA: Diagnosis not present

## 2016-07-01 DIAGNOSIS — F419 Anxiety disorder, unspecified: Secondary | ICD-10-CM | POA: Diagnosis not present

## 2016-07-01 DIAGNOSIS — E291 Testicular hypofunction: Secondary | ICD-10-CM | POA: Diagnosis not present

## 2016-07-06 ENCOUNTER — Encounter (INDEPENDENT_AMBULATORY_CARE_PROVIDER_SITE_OTHER): Payer: Self-pay | Admitting: Orthopedic Surgery

## 2016-07-06 ENCOUNTER — Ambulatory Visit (INDEPENDENT_AMBULATORY_CARE_PROVIDER_SITE_OTHER): Payer: PPO | Admitting: Orthopedic Surgery

## 2016-07-06 VITALS — Ht 71.0 in | Wt 320.0 lb

## 2016-07-06 DIAGNOSIS — M25872 Other specified joint disorders, left ankle and foot: Secondary | ICD-10-CM

## 2016-07-06 DIAGNOSIS — M25572 Pain in left ankle and joints of left foot: Secondary | ICD-10-CM | POA: Diagnosis not present

## 2016-07-06 NOTE — Progress Notes (Signed)
Office Visit Note   Patient: [name]           Date of Birth: 09/17/1960           MRN: [id number] Visit Date: 07/06/2016              Requested by: Simone Curia, MD [address] [place], [place] [address] PCP: Simone Curia, MD  Chief Complaint  Patient presents with  . Left Ankle - Pain      HPI: Patient is a 56 year old gentleman with multiple medical problems he denies diabetes he denies smoking he states he does have sleep apnea and does use a CPAP machine. Patient is status post 6 surgeries on his left hip for a MRSA infected total hip arthroplasty status post bilateral total knee arthroplasties. Patient is been having increasing pain in his left ankle. MRI scan was obtained and patient is seen in referral from Dr. Magnus Ivan. Patient complains of pain with weightbearing pain with activity stay living. He has tried an injection without relief.  Assessment & Plan: Visit Diagnoses:  1. Pain in left ankle and joints of left foot   2. Impingement syndrome of left ankle     Plan: Recommended proceeding with left ankle arthroscopy for the impingement syndrome. He does have a very small osteochondral defect of the lateral talar dome but has significant effusion of the ankle with what appears to be impingement. There is fluid around all the tendons of his ankle including the peroneal tendons and the posterior tibial tendon but there is no sign of any tendon disruption. Risks and benefits were discussed including potential for infection and persistent pain. Patient states he understands wishes to proceed at this time.  Follow-Up Instructions: Return in about 2 weeks (around 07/20/2016).   Ortho Exam  Patient is alert, oriented, no adenopathy, well-dressed, normal affect, normal respiratory effort. Examination patient does have an antalgic gait. He is a good dorsalis pedis and posterior tibial pulse the posterior tibial tendon and the peroneal tendons are nontender to  palpation. He is mostly tender to palpation anteriorly and posteriorly over the ankle joint. 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 His ankle is stable.  Imaging: No results found.  Labs: No results found for: HGBA1C, ESRSEDRATE, CRP, LABURIC, REPTSTATUS, GRAMSTAIN, CULT, LABORGA  Orders:  No orders of the defined types were placed in this encounter.  No orders of the defined types were placed in this encounter.    Procedures: No procedures performed  Clinical Data: No additional findings.  ROS:  All other systems negative, except as noted in the HPI. Review of Systems  Objective: Vital Signs: Ht 5\' 11"  (1.803 m)   Wt (!) 320 lb (145.2 kg)   BMI 44.63 kg/m   Specialty Comments:  No specialty comments available.  PMFS History: Patient Active Problem List   Diagnosis Date Noted  . History of total right knee replacement 03/09/2016  . Pain in left ankle and joints of left foot 03/09/2016   Past Medical History:  Diagnosis Date  . Depression   . Fractures   . Hirschsprung's disease   . Hypertension   . Sleep apnea     No family history on file.  Past Surgical History:  Procedure Laterality Date  . HIP SURGERY Left    At [facility]  . KNEE ARTHROSCOPY     Social History  Occupational History  . Not on file.   Social History Main Topics  . Smoking status: Current Every Day Smoker    Types: Cigarettes  . Smokeless tobacco: Never Used  . Alcohol use Not on file  . Drug use: Unknown  . Sexual activity: Not on file

## 2016-07-07 ENCOUNTER — Other Ambulatory Visit (INDEPENDENT_AMBULATORY_CARE_PROVIDER_SITE_OTHER): Payer: Self-pay | Admitting: Family

## 2016-07-12 NOTE — Progress Notes (Signed)
Pt. Called to say that he probably won't arrive dos until 0745- due to bus transportation & their schedule.

## 2016-07-13 ENCOUNTER — Encounter (HOSPITAL_COMMUNITY): Payer: Self-pay | Admitting: *Deleted

## 2016-07-13 MED ORDER — DEXTROSE 5 % IV SOLN
3.0000 g | INTRAVENOUS | Status: AC
  Administered 2016-07-14: 3 g via INTRAVENOUS
  Filled 2016-07-13: qty 3000

## 2016-07-13 NOTE — Progress Notes (Signed)
Pt denies SOB, chest pain, and being under the care of a cardiologist. Pt stated that a stress test was performed > 10 years ago but denies having an echo and cardiac cath. Pt denies having a chest x ray and EKG within the last year. Pt denies having recent labs. Pt stated that he was not instructed to stop taking Coumadin. Pt made aware to stop taking vitamins, fish oil and herbal medications. Do not take any NSAIDs ie: Ibuprofen, Advil, Naproxen, BC and Goody Powder. Spoke with Elnita Maxwellheryl, Surgical Coordinator, to make MD aware that pt does not have a ride home ( he plans to take public transportation despite being informed that it is against hospital policy). Elnita MaxwellCheryl stated that she will speak with MD and f/u with pt. Pt verbalized understanding of all pre-op instructions.

## 2016-07-14 ENCOUNTER — Ambulatory Visit (HOSPITAL_COMMUNITY): Payer: PPO | Admitting: Anesthesiology

## 2016-07-14 ENCOUNTER — Encounter (HOSPITAL_COMMUNITY)
Admission: RE | Disposition: A | Discharge status: Home / Self Care | Payer: Self-pay | Source: Ambulatory Visit | Attending: Orthopedic Surgery

## 2016-07-14 ENCOUNTER — Encounter (HOSPITAL_COMMUNITY): Payer: Self-pay | Admitting: Urology

## 2016-07-14 ENCOUNTER — Observation Stay (HOSPITAL_COMMUNITY)
Admission: RE | Admit: 2016-07-14 | Discharge: 2016-07-15 | Disposition: A | Discharge status: Home / Self Care | Payer: PPO | Source: Ambulatory Visit | Attending: Orthopedic Surgery | Admitting: Orthopedic Surgery

## 2016-07-14 DIAGNOSIS — G4730 Sleep apnea, unspecified: Secondary | ICD-10-CM | POA: Diagnosis not present

## 2016-07-14 DIAGNOSIS — Z79899 Other long term (current) drug therapy: Secondary | ICD-10-CM | POA: Insufficient documentation

## 2016-07-14 DIAGNOSIS — M25872 Other specified joint disorders, left ankle and foot: Secondary | ICD-10-CM | POA: Diagnosis not present

## 2016-07-14 DIAGNOSIS — Z7901 Long term (current) use of anticoagulants: Secondary | ICD-10-CM | POA: Diagnosis not present

## 2016-07-14 DIAGNOSIS — Z6841 Body Mass Index (BMI) 40.0 and over, adult: Secondary | ICD-10-CM | POA: Insufficient documentation

## 2016-07-14 DIAGNOSIS — D6851 Activated protein C resistance: Secondary | ICD-10-CM | POA: Insufficient documentation

## 2016-07-14 DIAGNOSIS — F17210 Nicotine dependence, cigarettes, uncomplicated: Secondary | ICD-10-CM | POA: Insufficient documentation

## 2016-07-14 DIAGNOSIS — I10 Essential (primary) hypertension: Secondary | ICD-10-CM | POA: Diagnosis not present

## 2016-07-14 DIAGNOSIS — F329 Major depressive disorder, single episode, unspecified: Secondary | ICD-10-CM | POA: Diagnosis not present

## 2016-07-14 DIAGNOSIS — G8918 Other acute postprocedural pain: Secondary | ICD-10-CM | POA: Diagnosis not present

## 2016-07-14 HISTORY — DX: Other specified joint disorders, left ankle and foot: M25.872

## 2016-07-14 HISTORY — DX: Methicillin resistant Staphylococcus aureus infection, unspecified site: A49.02

## 2016-07-14 HISTORY — DX: Frontal lobe and executive function deficit: R41.844

## 2016-07-14 HISTORY — DX: Activated protein C resistance: D68.51

## 2016-07-14 HISTORY — PX: OTHER SURGICAL HISTORY: SHX169

## 2016-07-14 HISTORY — DX: Acute embolism and thrombosis of unspecified deep veins of unspecified lower extremity: I82.409

## 2016-07-14 HISTORY — PX: ANKLE ARTHROSCOPY: SHX545

## 2016-07-14 LAB — BASIC METABOLIC PANEL
Anion gap: 8 (ref 5–15)
BUN: 17 mg/dL (ref 6–20)
CHLORIDE: 107 mmol/L (ref 101–111)
CO2: 23 mmol/L (ref 22–32)
CREATININE: 0.87 mg/dL (ref 0.61–1.24)
Calcium: 9.2 mg/dL (ref 8.9–10.3)
GFR calc Af Amer: >60 mL/min (ref >60)
GFR calc non Af Amer: >60 mL/min (ref >60)
Glucose, Bld: 91 mg/dL (ref 65–99)
Potassium: 4 mmol/L (ref 3.5–5.1)
Sodium: 138 mmol/L (ref 135–145)

## 2016-07-14 LAB — CBC
HCT: 46.2 % (ref 39.0–52.0)
Hemoglobin: 15.2 g/dL (ref 13.0–17.0)
MCH: 30.7 pg (ref 26.0–34.0)
MCHC: 32.9 g/dL (ref 30.0–36.0)
MCV: 93.3 fL (ref 78.0–100.0)
PLATELETS: 202 10*3/uL (ref 150–400)
RBC: 4.95 MIL/uL (ref 4.22–5.81)
RDW: 13.6 % (ref 11.5–15.5)
WBC: 7.2 10*3/uL (ref 4.0–10.5)

## 2016-07-14 LAB — PROTIME-INR
INR: 1.56
Prothrombin Time: 18.8 seconds — ABNORMAL HIGH (ref 11.4–15.2)

## 2016-07-14 LAB — SURGICAL PCR SCREEN
MRSA, PCR: NEGATIVE
Staphylococcus aureus: NEGATIVE

## 2016-07-14 SURGERY — ARTHROSCOPY, ANKLE
Anesthesia: General | Site: Ankle | Laterality: Left

## 2016-07-14 MED ORDER — FENTANYL CITRATE (PF) 250 MCG/5ML IJ SOLN
INTRAMUSCULAR | Status: AC
  Filled 2016-07-14: qty 5

## 2016-07-14 MED ORDER — GABAPENTIN 400 MG PO CAPS
1200.0000 mg | ORAL_CAPSULE | Freq: Every evening | ORAL | Status: DC
  Administered 2016-07-14: 1200 mg via ORAL
  Filled 2016-07-14: qty 3

## 2016-07-14 MED ORDER — OXYCODONE HCL 5 MG/5ML PO SOLN: 5.0000 mg | Freq: Once | ORAL | Status: DC | PRN

## 2016-07-14 MED ORDER — HYDROMORPHONE HCL 1 MG/ML IJ SOLN
1.0000 mg | INTRAMUSCULAR | Status: DC | PRN
  Administered 2016-07-14: 1 mg via INTRAVENOUS
  Filled 2016-07-14: qty 1

## 2016-07-14 MED ORDER — SODIUM CHLORIDE 0.9 % IR SOLN
Status: DC | PRN
  Administered 2016-07-14: 6000 mL

## 2016-07-14 MED ORDER — BACLOFEN 10 MG PO TABS: 10.0000 mg | ORAL_TABLET | Freq: Every day | ORAL | Status: DC | PRN

## 2016-07-14 MED ORDER — LIDOCAINE HCL (CARDIAC) 20 MG/ML IV SOLN
INTRAVENOUS | Status: DC | PRN
  Administered 2016-07-14: 100 mg via INTRAVENOUS

## 2016-07-14 MED ORDER — VENLAFAXINE HCL ER 150 MG PO CP24
150.0000 mg | ORAL_CAPSULE | Freq: Every day | ORAL | Status: DC
  Administered 2016-07-15: 150 mg via ORAL
  Filled 2016-07-14: qty 1

## 2016-07-14 MED ORDER — MIDAZOLAM HCL 2 MG/2ML IJ SOLN
INTRAMUSCULAR | Status: AC
  Filled 2016-07-14: qty 2

## 2016-07-14 MED ORDER — CHLORHEXIDINE GLUCONATE 4 % EX LIQD: 60.0000 mL | Freq: Once | CUTANEOUS | Status: DC

## 2016-07-14 MED ORDER — METOCLOPRAMIDE HCL 5 MG PO TABS: 5.0000 mg | ORAL_TABLET | Freq: Three times a day (TID) | ORAL | Status: DC | PRN

## 2016-07-14 MED ORDER — GABAPENTIN 400 MG PO CAPS
800.0000 mg | ORAL_CAPSULE | Freq: Every morning | ORAL | Status: DC
  Administered 2016-07-15: 800 mg via ORAL
  Filled 2016-07-14: qty 2

## 2016-07-14 MED ORDER — FENTANYL CITRATE (PF) 100 MCG/2ML IJ SOLN
50.0000 ug | Freq: Once | INTRAMUSCULAR | Status: AC
  Administered 2016-07-14: 50 ug via INTRAVENOUS

## 2016-07-14 MED ORDER — SODIUM CHLORIDE 0.9 % IV SOLN
INTRAVENOUS | Status: DC
  Administered 2016-07-14: 12:00:00 via INTRAVENOUS

## 2016-07-14 MED ORDER — PROPOFOL 10 MG/ML IV BOLUS
INTRAVENOUS | Status: AC
  Filled 2016-07-14: qty 20

## 2016-07-14 MED ORDER — ONDANSETRON HCL 4 MG/2ML IJ SOLN: 4.0000 mg | Freq: Four times a day (QID) | INTRAMUSCULAR | Status: DC | PRN

## 2016-07-14 MED ORDER — TRAZODONE HCL 100 MG PO TABS
200.0000 mg | ORAL_TABLET | Freq: Every day | ORAL | Status: DC
  Administered 2016-07-14: 200 mg via ORAL
  Filled 2016-07-14: qty 2

## 2016-07-14 MED ORDER — OXYCODONE HCL 5 MG PO TABS: 5.0000 mg | ORAL_TABLET | Freq: Once | ORAL | Status: DC | PRN

## 2016-07-14 MED ORDER — DOCUSATE SODIUM 100 MG PO CAPS
100.0000 mg | ORAL_CAPSULE | Freq: Two times a day (BID) | ORAL | Status: DC
  Administered 2016-07-14: 100 mg via ORAL
  Filled 2016-07-14 (×2): qty 1

## 2016-07-14 MED ORDER — METHOCARBAMOL 500 MG PO TABS
ORAL_TABLET | ORAL | Status: AC
  Administered 2016-07-14: 500 mg via ORAL
  Filled 2016-07-14: qty 1

## 2016-07-14 MED ORDER — ACETAMINOPHEN 650 MG RE SUPP: 650.0000 mg | Freq: Four times a day (QID) | RECTAL | Status: DC | PRN

## 2016-07-14 MED ORDER — METOCLOPRAMIDE HCL 5 MG/ML IJ SOLN: 5.0000 mg | Freq: Three times a day (TID) | INTRAMUSCULAR | Status: DC | PRN

## 2016-07-14 MED ORDER — ATORVASTATIN CALCIUM 20 MG PO TABS
20.0000 mg | ORAL_TABLET | Freq: Every day | ORAL | Status: DC
Start: 2016-07-14 — End: 2016-07-15
  Filled 2016-07-14: qty 1

## 2016-07-14 MED ORDER — ONDANSETRON HCL 4 MG PO TABS: 4.0000 mg | ORAL_TABLET | Freq: Four times a day (QID) | ORAL | Status: DC | PRN

## 2016-07-14 MED ORDER — POLYETHYLENE GLYCOL 3350 17 G PO PACK: 17.0000 g | PACK | Freq: Every day | ORAL | Status: DC | PRN

## 2016-07-14 MED ORDER — ONDANSETRON HCL 4 MG/2ML IJ SOLN
INTRAMUSCULAR | Status: DC | PRN
  Administered 2016-07-14: 4 mg via INTRAVENOUS

## 2016-07-14 MED ORDER — CEFAZOLIN SODIUM-DEXTROSE 2-4 GM/100ML-% IV SOLN
2.0000 g | Freq: Four times a day (QID) | INTRAVENOUS | Status: AC
Start: 2016-07-14 — End: 2016-07-15
  Administered 2016-07-14 – 2016-07-15 (×3): 2 g via INTRAVENOUS
  Filled 2016-07-14 (×3): qty 100

## 2016-07-14 MED ORDER — METHOCARBAMOL 500 MG PO TABS
500.0000 mg | ORAL_TABLET | Freq: Four times a day (QID) | ORAL | Status: DC | PRN
  Administered 2016-07-14: 500 mg via ORAL

## 2016-07-14 MED ORDER — FENTANYL CITRATE (PF) 100 MCG/2ML IJ SOLN
INTRAMUSCULAR | Status: AC
  Administered 2016-07-14: 50 ug via INTRAVENOUS
  Filled 2016-07-14: qty 2

## 2016-07-14 MED ORDER — OXYCODONE HCL 5 MG PO TABS
5.0000 mg | ORAL_TABLET | ORAL | Status: DC | PRN
  Administered 2016-07-14 – 2016-07-15 (×3): 10 mg via ORAL
  Filled 2016-07-14 (×2): qty 2

## 2016-07-14 MED ORDER — OXYCODONE HCL 5 MG PO TABS
ORAL_TABLET | ORAL | Status: AC
  Administered 2016-07-14: 10 mg via ORAL
  Filled 2016-07-14: qty 2

## 2016-07-14 MED ORDER — LACTATED RINGERS IV SOLN
INTRAVENOUS | Status: DC
  Administered 2016-07-14 (×2): via INTRAVENOUS

## 2016-07-14 MED ORDER — METHOCARBAMOL 1000 MG/10ML IJ SOLN: 500.0000 mg | Freq: Four times a day (QID) | INTRAMUSCULAR | Status: DC | PRN

## 2016-07-14 MED ORDER — FLUOXETINE HCL 20 MG PO CAPS
80.0000 mg | ORAL_CAPSULE | Freq: Every day | ORAL | Status: DC
  Administered 2016-07-15: 80 mg via ORAL
  Filled 2016-07-14: qty 4

## 2016-07-14 MED ORDER — DEXAMETHASONE SODIUM PHOSPHATE 4 MG/ML IJ SOLN: INTRAMUSCULAR | Status: DC | PRN

## 2016-07-14 MED ORDER — ACETAMINOPHEN 325 MG PO TABS
650.0000 mg | ORAL_TABLET | Freq: Four times a day (QID) | ORAL | Status: DC | PRN
  Administered 2016-07-14: 650 mg via ORAL

## 2016-07-14 MED ORDER — FENTANYL CITRATE (PF) 100 MCG/2ML IJ SOLN
25.0000 ug | INTRAMUSCULAR | Status: DC | PRN
  Administered 2016-07-14 (×2): 50 ug via INTRAVENOUS

## 2016-07-14 MED ORDER — BISACODYL 10 MG RE SUPP
10.0000 mg | Freq: Every day | RECTAL | Status: DC | PRN
Start: 2016-07-14 — End: 2016-07-15

## 2016-07-14 MED ORDER — PROPOFOL 10 MG/ML IV BOLUS
INTRAVENOUS | Status: DC | PRN
  Administered 2016-07-14: 200 mg via INTRAVENOUS

## 2016-07-14 MED ORDER — MIDAZOLAM HCL 2 MG/2ML IJ SOLN
2.0000 mg | Freq: Once | INTRAMUSCULAR | Status: AC
  Administered 2016-07-14: 2 mg via INTRAVENOUS

## 2016-07-14 MED ORDER — KETOROLAC TROMETHAMINE 15 MG/ML IJ SOLN: 15.0000 mg | Freq: Four times a day (QID) | INTRAMUSCULAR | Status: DC

## 2016-07-14 MED ORDER — ACETAMINOPHEN 325 MG PO TABS
ORAL_TABLET | ORAL | Status: AC
  Administered 2016-07-14: 650 mg via ORAL
  Filled 2016-07-14: qty 2

## 2016-07-14 MED ORDER — MAGNESIUM CITRATE PO SOLN: 1.0000 | Freq: Once | ORAL | Status: DC | PRN

## 2016-07-14 MED ORDER — WARFARIN SODIUM 5 MG PO TABS
10.0000 mg | ORAL_TABLET | Freq: Once | ORAL | Status: AC
  Administered 2016-07-14: 10 mg via ORAL
  Filled 2016-07-14: qty 2

## 2016-07-14 MED ORDER — WARFARIN - PHARMACIST DOSING INPATIENT: Freq: Every day | Status: DC

## 2016-07-14 MED ORDER — BUPIVACAINE-EPINEPHRINE (PF) 0.5% -1:200000 IJ SOLN
INTRAMUSCULAR | Status: DC | PRN
  Administered 2016-07-14: 30 mL via PERINEURAL

## 2016-07-14 SURGICAL SUPPLY — 47 items
BANDAGE ESMARK 6X9 LF (GAUZE/BANDAGES/DRESSINGS) ×1 IMPLANT
BLADE CUDA 5.5 (BLADE) ×3 IMPLANT
BLADE GREAT WHITE 4.2 (BLADE) IMPLANT
BLADE GREAT WHITE 4.2MM (BLADE)
BLADE INCISOR PLUS 5.5 (BLADE) IMPLANT
BNDG COHESIVE 6X5 TAN STRL LF (GAUZE/BANDAGES/DRESSINGS) ×3 IMPLANT
BNDG ESMARK 6X9 LF (GAUZE/BANDAGES/DRESSINGS) ×3
BNDG GAUZE ELAST 4 BULKY (GAUZE/BANDAGES/DRESSINGS) ×3 IMPLANT
BUR OVAL 4.0 (BURR) IMPLANT
COVER SURGICAL LIGHT HANDLE (MISCELLANEOUS) ×3 IMPLANT
CUFF TOURNIQUET SINGLE 34IN LL (TOURNIQUET CUFF) IMPLANT
CUFF TOURNIQUET SINGLE 44IN (TOURNIQUET CUFF) ×3 IMPLANT
DRAPE ARTHROSCOPY W/POUCH 114 (DRAPES) ×3 IMPLANT
DRAPE C-ARM MINI 42X72 WSTRAPS (DRAPES) IMPLANT
DRAPE U-SHAPE 47X51 STRL (DRAPES) ×3 IMPLANT
DRSG EMULSION OIL 3X3 NADH (GAUZE/BANDAGES/DRESSINGS) ×3 IMPLANT
DRSG PAD ABDOMINAL 8X10 ST (GAUZE/BANDAGES/DRESSINGS) IMPLANT
DURAPREP 26ML APPLICATOR (WOUND CARE) ×3 IMPLANT
GAUZE SPONGE 4X4 12PLY STRL (GAUZE/BANDAGES/DRESSINGS) ×3 IMPLANT
GLOVE BIOGEL PI IND STRL 9 (GLOVE) ×1 IMPLANT
GLOVE BIOGEL PI INDICATOR 9 (GLOVE) ×2
GLOVE SURG ORTHO 9.0 STRL STRW (GLOVE) ×3 IMPLANT
GOWN STRL REUS W/ TWL XL LVL3 (GOWN DISPOSABLE) ×3 IMPLANT
GOWN STRL REUS W/TWL XL LVL3 (GOWN DISPOSABLE) ×6
KIT BASIN OR (CUSTOM PROCEDURE TRAY) ×3 IMPLANT
KIT ROOM TURNOVER OR (KITS) ×3 IMPLANT
MANIFOLD NEPTUNE II (INSTRUMENTS) ×3 IMPLANT
NEEDLE 18GX1X1/2 (RX/OR ONLY) (NEEDLE) IMPLANT
PACK ARTHROSCOPY DSU (CUSTOM PROCEDURE TRAY) ×3 IMPLANT
PAD ARMBOARD 7.5X6 YLW CONV (MISCELLANEOUS) ×6 IMPLANT
PADDING CAST COTTON 6X4 STRL (CAST SUPPLIES) IMPLANT
PROBE BIPOLAR ATHRO 135MM 90D (MISCELLANEOUS) IMPLANT
SET ARTHROSCOPY TUBING (MISCELLANEOUS) ×2
SET ARTHROSCOPY TUBING LN (MISCELLANEOUS) ×1 IMPLANT
SPONGE LAP 4X18 X RAY DECT (DISPOSABLE) ×3 IMPLANT
SUT ETHILON 3 0 FSL (SUTURE) ×3 IMPLANT
SUT ETHILON 4 0 PS 2 18 (SUTURE) ×3 IMPLANT
SUT MNCRL AB 3-0 PS2 18 (SUTURE) IMPLANT
SUT VIC AB 2-0 CT1 27 (SUTURE)
SUT VIC AB 2-0 CT1 TAPERPNT 27 (SUTURE) IMPLANT
SYR 20CC LL (SYRINGE) ×3 IMPLANT
TAPE STRIPS DRAPE STRL (GAUZE/BANDAGES/DRESSINGS) IMPLANT
TOWEL OR 17X24 6PK STRL BLUE (TOWEL DISPOSABLE) ×3 IMPLANT
TOWEL OR 17X26 10 PK STRL BLUE (TOWEL DISPOSABLE) ×3 IMPLANT
WAND HAND CNTRL MULTIVAC 90 (MISCELLANEOUS) IMPLANT
WAND STAR VAC 90 (SURGICAL WAND) ×3 IMPLANT
WATER STERILE IRR 1000ML POUR (IV SOLUTION) ×3 IMPLANT

## 2016-07-14 NOTE — Anesthesia Procedure Notes (Signed)
Anesthesia Regional Block: Popliteal block   Pre-Anesthetic Checklist: ,, timeout performed, Correct Patient, Correct Site, Correct Laterality, Correct Procedure, Correct Position, site marked, Risks and benefits discussed,  Surgical consent,  Pre-op evaluation,  At surgeon's request and post-op pain management  Laterality: Left  Prep: chloraprep       Needles:  Injection technique: Single-shot  Needle Type: Echogenic Stimulator Needle          Additional Needles:   Procedures: ultrasound guided, nerve stimulator,,,,,,   Nerve Stimulator or Paresthesia:  Response: plantar flexion of foot, 0.45 mA,   Additional Responses:   Narrative:  Start time: 07/14/2016 9:20 AM End time: 07/14/2016 9:25 AM Injection made incrementally with aspirations every 5 mL.  Performed by: Personally  Anesthesiologist: Amilliana Hayworth  Additional Notes: Functioning IV was confirmed and monitors were applied.  A 90mm 21ga Arrow echogenic stimulator needle was used. Sterile prep and drape,hand hygiene and sterile gloves were used.  Negative aspiration and negative test dose prior to incremental administration of local anesthetic. The patient tolerated the procedure well.  Ultrasound guidance: relevent anatomy identified, needle position confirmed, local anesthetic spread visualized around nerve(s), vascular puncture avoided.  Image printed for medical record.

## 2016-07-14 NOTE — Anesthesia Procedure Notes (Signed)
Procedure Name: LMA Insertion Date/Time: 07/14/2016 9:45 AM Performed by: Izola PriceOCKFIELD JR, Lacrystal Barbe WALTON Pre-anesthesia Checklist: Patient identified, Emergency Drugs available, Suction available, Patient being monitored and Timeout performed Patient Re-evaluated:Patient Re-evaluated prior to inductionOxygen Delivery Method: Circle system utilized Preoxygenation: Pre-oxygenation with 100% oxygen Intubation Type: IV induction Ventilation: Mask ventilation without difficulty LMA: LMA inserted LMA Size: 5.0 Number of attempts: 1 Dental Injury: Teeth and Oropharynx as per pre-operative assessment

## 2016-07-14 NOTE — Anesthesia Postprocedure Evaluation (Signed)
Anesthesia Post Note  Patient: [name]  Procedure(s) Performed: Procedure(s) (LRB): LEFT ANKLE ARTHROSCOPY AND DEBRIDEMENT (Left)     Patient location during evaluation: PACU Anesthesia Type: General Level of consciousness: awake and alert and patient cooperative Pain management: pain level controlled Vital Signs Assessment: post-procedure vital signs reviewed and stable Respiratory status: spontaneous breathing and respiratory function stable Cardiovascular status: stable Anesthetic complications: no    Last Vitals:  Vitals:   07/14/16 1100 07/14/16 1115  BP: 93/60   Pulse: 65 63  Resp: 19 16  Temp:  36.7 C    Last Pain:  Vitals:   07/14/16 1013  TempSrc:   PainSc: 7                  Niema Carrara S

## 2016-07-14 NOTE — Progress Notes (Signed)
ANTICOAGULATION CONSULT NOTE - Initial Consult  Pharmacy Consult for Warfarin Indication: Hx. DVT/Factor V Leiden deficiency  Allergies  Allergen Reactions  . No Known Allergies    Patient Measurements: Weight: (!) 320 lb (145.2 kg)  Vital Signs: Temp: 98 F (36.7 C) (06/20 1115) Temp Source: Oral (06/20 0823) BP: 93/60 (06/20 1100) Pulse Rate: 63 (06/20 1115)  Labs:  Recent Labs  07/14/16 0813  HGB 15.2  HCT 46.2  PLT 202  LABPROT 18.8*  INR 1.56  CREATININE 0.87   Estimated Creatinine Clearance: 140.2 mL/min (by C-G formula based on SCr of 0.87 mg/dL).  Medical History: Past Medical History:  Diagnosis Date  . Depression   . DVT (deep venous thrombosis) (HCC)    right clavicle and LLE  . Factor V Leiden (HCC)   . Fractures   . Frontal lobe deficit   . Hirschsprung's disease   . Hypertension   . Impingement syndrome of left ankle   . MRSA infection    left hip   . Sleep apnea    wears CPAP   Medications:  Anticoagulation Prior to Admission  Medication Sig Dispense Refill Last Dose  . warfarin (COUMADIN) 5 MG tablet Take 5 mg by mouth every other day. Alternating with the 7.5 mg   07/12/2016  . warfarin (COUMADIN) 7.5 MG tablet Take 7.5 mg by mouth every other day. Alternating with the 5 mg   07/11/2016    Assessment: 56yo male admitted with impingement of left ankle for intervention.  He is on Warfarin therapy prior to admit for history of DVT and Factor V Leiden deficiency.  We have been asked to continue his Warfarin therapy. HOME Dose:  Warfarin 7.5mg  alternating with 5mg  with last dose on 6/18 and admitting INR of 1.8.   HEME:  H/H and platelets are stable and no noted bleeding.  Goal of Therapy:  INR 2-3 Monitor platelets by anticoagulation protocol: Yes   Plan:  Warfarin 10mg  PO x 1 tonight - will resume home regimen tomorrow. Daily PT/INR for now.  Nadara MustardNita Bralynn Velador, PharmD., MS Clinical Pharmacist Pager:  [phone] Thank you for allowing  pharmacy to be part of this patients care team. 07/14/2016,12:04 PM

## 2016-07-14 NOTE — Progress Notes (Signed)
Orthopedic Tech Progress Note Patient Details:  [name] 09/24/1960 [id number]  Ortho Devices Type of Ortho Device: Postop shoe/boot Ortho Device/Splint Location: lle Ortho Device/Splint Interventions: Application   Marius Betts 07/14/2016, 2:19 PM

## 2016-07-14 NOTE — Anesthesia Preprocedure Evaluation (Signed)
Anesthesia Evaluation  Patient identified by MRN, date of birth, ID band Patient awake    Reviewed: Allergy & Precautions, NPO status   Airway Mallampati: II   Neck ROM: full    Dental   Pulmonary sleep apnea , Current Smoker,    breath sounds clear to auscultation       Cardiovascular hypertension, + DVT   Rhythm:regular Rate:Normal     Neuro/Psych PSYCHIATRIC DISORDERS Depression    GI/Hepatic   Endo/Other  Morbid obesity  Renal/GU      Musculoskeletal   Abdominal   Peds  Hematology   Anesthesia Other Findings   Reproductive/Obstetrics                             Anesthesia Physical Anesthesia Plan  ASA: II  Anesthesia Plan: General   Post-op Pain Management:  Regional for Post-op pain   Induction: Intravenous  PONV Risk Score and Plan: 1 and Ondansetron, Dexamethasone, Treatment may vary due to age or medical condition and Midazolam  Airway Management Planned: LMA  Additional Equipment:   Intra-op Plan:   Post-operative Plan:   Informed Consent: I have reviewed the patients History and Physical, chart, labs and discussed the procedure including the risks, benefits and alternatives for the proposed anesthesia with the patient or authorized representative who has indicated his/her understanding and acceptance.     Plan Discussed with: CRNA, Anesthesiologist and Surgeon  Anesthesia Plan Comments:         Anesthesia Quick Evaluation

## 2016-07-14 NOTE — Evaluation (Signed)
Physical Therapy Evaluation Patient Details Name: [name] MRN: [id number] DOB: 02/18/1960 Today's Date: 07/14/2016   History of Present Illness  Pt is a 56 yo male with L ankle pain, s/p 07/14/16 ankle impingement surgery. PHH significant for ankle fx, HTN and L ankle impingement syndrome   Clinical Impression  Pt admitted with above diagnosis. Pt currently with functional limitations due to the deficits listed below (see PT Problem List). Pt is supervision with bed mobility, transfers and min guard for ambulation of 25 feet. Pt limited by  decreased sensation.  Pt will benefit from skilled PT to increase their independence and safety with mobility to allow discharge to the venue listed below.       Follow Up Recommendations Home health PT;Supervision - Intermittent    Equipment Recommendations  Rolling walker with 5" wheels    Recommendations for Other Services       Precautions / Restrictions Precautions Precautions: Fall Required Braces or Orthoses: Other Brace/Splint (Darco shoe) Restrictions Weight Bearing Restrictions: Yes LLE Weight Bearing: Weight bearing as tolerated      Mobility  Bed Mobility Overal bed mobility: Modified Independent             General bed mobility comments: uses bed rail to pull up  Transfers Overall transfer level: Needs assistance Equipment used: Rolling walker (2 wheeled) Transfers: Sit to/from Stand Sit to Stand: Supervision         General transfer comment: supervision for safety  Ambulation/Gait Ambulation/Gait assistance: Min guard Ambulation Distance (Feet): 25 Feet Assistive device: Rolling walker (2 wheeled) Gait Pattern/deviations: Step-through pattern;Trunk flexed;Decreased stance time - left;Decreased weight shift to left Gait velocity: slowed Gait velocity interpretation: Below normal speed for age/gender General Gait Details: min guard for safety difficulty keeping Darco boot on foot         Balance  Overall balance assessment: No apparent balance deficits (not formally assessed)                                           Pertinent Vitals/Pain Pain Assessment: 0-10 Pain Score: 0-No pain Pain Location: L ankle Pain Descriptors / Indicators: Numbness Pain Intervention(s): Monitored during session;Limited activity within patient's tolerance  VSS    Home Living Family/patient expects to be discharged to:: Private residence Living Arrangements: Alone   Type of Home: Apartment Home Access: Level entry     Home Layout: One level Home Equipment: Cane - single point      Prior Function Level of Independence: Independent         Comments: community ambulator     Higher education careers adviserHand Dominance        Extremity/Trunk Assessment   Upper Extremity Assessment Upper Extremity Assessment: Defer to OT evaluation    Lower Extremity Assessment Lower Extremity Assessment: LLE deficits/detail LLE Deficits / Details: L LE ankle ROM limited by pain and immobilization LLE: Unable to fully assess due to pain;Unable to fully assess due to immobilization LLE Sensation: decreased light touch (secondary to anesthetic)    Cervical / Trunk Assessment Cervical / Trunk Assessment: Normal  Communication   Communication: No difficulties  Cognition Arousal/Alertness: Awake/alert Behavior During Therapy: WFL for tasks assessed/performed Overall Cognitive Status: Within Functional Limits for tasks assessed  General Comments General comments (skin integrity, edema, etc.): at time of eval pt with post operative numbness in  L foot and ankle        Assessment/Plan    PT Assessment Patient needs continued PT services  PT Problem List Decreased activity tolerance;Impaired sensation;Pain       PT Treatment Interventions Gait training;DME instruction;Functional mobility training;Therapeutic activities;Therapeutic exercise;Balance  training;Patient/family education    PT Goals (Current goals can be found in the Care Plan section)  Acute Rehab PT Goals Patient Stated Goal: go home PT Goal Formulation: With patient Time For Goal Achievement: 07/21/16 Potential to Achieve Goals: Good    Frequency Min 3X/week   Barriers to discharge Decreased caregiver support      M-PAC PT "6 Clicks" Daily Activity  Outcome Measure Difficulty turning over in bed (including adjusting bedclothes, sheets and blankets)?: None Difficulty moving from lying on back to sitting on the side of the bed? : None Difficulty sitting down on and standing up from a chair with arms (e.g., wheelchair, bedside commode, etc,.)?: None Help needed moving to and from a bed to chair (including a wheelchair)?: None Help needed walking in hospital room?: None Help needed climbing 3-5 steps with a railing? : A Little 6 Click Score: 23    End of Session Equipment Utilized During Treatment: Gait belt Activity Tolerance: Patient tolerated treatment well Patient left: in chair;with call bell/phone within reach Nurse Communication: Mobility status PT Visit Diagnosis: Difficulty in walking, not elsewhere classified (R26.2);Other abnormalities of gait and mobility (R26.89);Other symptoms and signs involving the nervous system (R29.898);Pain Pain - Right/Left: Left Pain - part of body: Ankle and joints of foot    Time: 4540-9811 PT Time Calculation (min) (ACUTE ONLY): 32 min   Charges:   PT Evaluation $PT Eval Low Complexity: 1 Procedure PT Treatments $Gait Training: 8-22 mins   PT G Codes:        [name] B. Beverely Risen PT, DPT Acute Rehabilitation  [phone] Pager [phone]     [name] 07/14/2016, 5:28 PM

## 2016-07-14 NOTE — H&P (Signed)
[name] is an 56 y.o. male.   ChiefJohnette Woods Complaint: Impingement left ankle HPI: Patient is a 56 year old gentleman who is status post ankle fracture. Patient has developed traumatic arthritis with impingement which has had temporary relief with a steroid injection  Past Medical History:  Diagnosis Date  . Depression   . DVT (deep venous thrombosis) (HCC)    right clavicle and LLE  . Factor V Leiden (HCC)   . Fractures   . Frontal lobe deficit   . Hirschsprung's disease   . Hypertension   . Impingement syndrome of left ankle   . MRSA infection    left hip   . Sleep apnea    wears CPAP    Past Surgical History:  Procedure Laterality Date  . COLON SURGERY     Mega colon  . COLONOSCOPY    . [facility]  . KNEE ARTHROSCOPY      Family History  Problem Relation Age of Onset  . Heart attack Father    Social History:  reports that he has been smoking Cigarettes.  He has been smoking about 0.25 packs per day. He has never used smokeless tobacco. He reports that he uses drugs, including Marijuana. He reports that he does not drink alcohol.  Allergies:  Allergies  Allergen Reactions  . No Known Allergies     No prescriptions prior to admission.    No results found for this or any previous visit (from the past 48 hour(s)). No results found.  Review of Systems  All other systems reviewed and are negative.   There were no vitals taken for this visit. Physical Exam  Examination patient has a palpable dorsalis pedis pulse he is alert oriented no adenopathy well-dressed normal affect normal S1 effort he has no antalgic gait. He is tender to palpation anteriorly over the ankle joint line. There is no redness no cellulitis no signs of gout or infection Assessment/Plan Assessment: Traumatic arthritis left ankle with impingement.  Plan: We'll plan for left ankle arthroscopy with debridement. Risk and benefits were discussed including persistent pain. Patient  states he understands wishes to proceed at this time.  Nadara MustardMarcus V Rondal Vandevelde, MD 07/14/2016, 6:43 AM

## 2016-07-14 NOTE — Transfer of Care (Signed)
Immediate Anesthesia Transfer of Care Note  Patient: [name]  Procedure(s) Performed: Procedure(s): LEFT ANKLE ARTHROSCOPY AND DEBRIDEMENT (Left)  Patient Location: PACU  Anesthesia Type:GA combined with regional for post-op pain  Level of Consciousness: awake, alert  and oriented  Airway & Oxygen Therapy: Patient Spontanous Breathing and Patient connected to nasal cannula oxygen  Post-op Assessment: Report given to RN and Post -op Vital signs reviewed and stable  Post vital signs: Reviewed and stable  Last Vitals:  Vitals:   07/14/16 0925 07/14/16 0930  BP:  (!) 93/48  Pulse: 65 66  Resp: (!) 21 (!) 22  Temp:      Last Pain:  Vitals:   07/14/16 0930  TempSrc:   PainSc: 7       Patients Stated Pain Goal: 7 (07/14/16 0816)  Complications: No apparent anesthesia complications

## 2016-07-14 NOTE — Op Note (Signed)
07/14/2016  10:13 AM  PATIENT:  [name]    PRE-OPERATIVE DIAGNOSIS:  Impingement Left Ankle  POST-OPERATIVE DIAGNOSIS:  Same  PROCEDURE:  LEFT ANKLE ARTHROSCOPY AND DEBRIDEMENT  SURGEON:  Nadara MustardMarcus V Mariah Harn, MD  PHYSICIAN ASSISTANT:None ANESTHESIA:   General  PREOPERATIVE INDICATIONS:  Erik Abrahamhomas O Calleros is a  56 y.o. male with a diagnosis of Impingement Left Ankle who failed conservative measures and elected for surgical management.    The risks benefits and alternatives were discussed with the patient preoperatively including but not limited to the risks of infection, bleeding, nerve injury, cardiopulmonary complications, the need for revision surgery, among others, and the patient was willing to proceed.  OPERATIVE IMPLANTS: none  OPERATIVE FINDINGS: Extensive synovitis with partial-thickness delamination of the cartilage of the talar dome  OPERATIVE PROCEDURE: Patient was brought to the operating room and underwent a general anesthetic after a popliteal block. After adequate levels anesthesia obtained patient's left lower extremity was prepped using DuraPrep draped into a sterile field a timeout was called. The scope was inserted through the anterior medial portal and anterior lateral working portal was established. The skin was incised blunt dissection was carried down to the capsule and a blunt trocar was used to insert into the capsule. Visualization showed extensive synovitis of the medial and lateral gutters as well as anteriorly with partial-thickness tearing of the cartilage. With a electrical cautery and the shaver the synovectomy was performed debridement of the partial tearing of the articular cartilage was debrided. There is no osteochondral defects of the talar dome. Electrocautery was used for hemostasis. The instruments removed the portals were closed using 4-0 nylon. A sterile compressive dressing was applied patient was extubated taken the PACU in stable condition.

## 2016-07-15 ENCOUNTER — Encounter (HOSPITAL_COMMUNITY): Payer: Self-pay | Admitting: Orthopedic Surgery

## 2016-07-15 DIAGNOSIS — M25872 Other specified joint disorders, left ankle and foot: Secondary | ICD-10-CM | POA: Diagnosis not present

## 2016-07-15 DIAGNOSIS — R269 Unspecified abnormalities of gait and mobility: Secondary | ICD-10-CM | POA: Diagnosis not present

## 2016-07-15 DIAGNOSIS — M25879 Other specified joint disorders, unspecified ankle and foot: Secondary | ICD-10-CM | POA: Diagnosis not present

## 2016-07-15 LAB — PROTIME-INR
INR: 1.58
Prothrombin Time: 19 seconds — ABNORMAL HIGH (ref 11.4–15.2)

## 2016-07-15 MED ORDER — OXYCODONE-ACETAMINOPHEN 5-325 MG PO TABS: 1.0000 | ORAL_TABLET | ORAL | 0 refills | Status: DC | PRN

## 2016-07-15 MED ORDER — WARFARIN SODIUM 5 MG PO TABS: 10.0000 mg | ORAL_TABLET | Freq: Once | ORAL | Status: DC

## 2016-07-15 NOTE — Progress Notes (Signed)
ANTICOAGULATION CONSULT NOTE - Consult  Pharmacy Consult for Warfarin Indication: Hx. DVT/Factor V Leiden deficiency  Allergies  Allergen Reactions  . No Known Allergies    Patient Measurements: Weight: (!) 320 lb (145.2 kg)  Vital Signs: Temp: 98.4 F (36.9 C) (06/21 0500) Temp Source: Oral (06/21 0500) BP: 122/51 (06/21 0500) Pulse Rate: 65 (06/21 0500)  Labs:  Recent Labs  07/14/16 0813 07/15/16 0641  HGB 15.2  --   HCT 46.2  --   PLT 202  --   LABPROT 18.8* 19.0*  INR 1.56 1.58  CREATININE 0.87  --    Estimated Creatinine Clearance: 140.2 mL/min (by C-G formula based on SCr of 0.87 mg/dL).  Medical History: Past Medical History:  Diagnosis Date  . Depression   . DVT (deep venous thrombosis) (HCC)    right clavicle and LLE  . Factor V Leiden (HCC)   . Fractures   . Frontal lobe deficit   . Hirschsprung's disease   . Hypertension   . Impingement syndrome of left ankle   . MRSA infection    left hip   . Sleep apnea    wears CPAP   Medications:  Anticoagulation Prior to Admission  Medication Sig Dispense Refill Last Dose  . warfarin (COUMADIN) 5 MG tablet Take 5 mg by mouth every other day. Alternating with the 7.5 mg   07/12/2016  . warfarin (COUMADIN) 7.5 MG tablet Take 7.5 mg by mouth every other day. Alternating with the 5 mg   07/11/2016    Assessment: 56yo male admitted with impingement of left ankle for intervention.  He is on Warfarin therapy prior to admit for history of DVT and Factor V Leiden deficiency.  We have been asked to continue his Warfarin therapy. HOME Dose:  Warfarin 7.5mg  alternating with 5mg  with last dose on 6/18 and admitting INR of 1.8.   HEME:  H/H and platelets are stable and no noted bleeding.  Goal of Therapy:  INR 2-3 Monitor platelets by anticoagulation protocol: Yes   Plan:  Warfarin 10mg  PO x 1 tonight - will resume home regimen when appropriate Daily PT/INR for now.  Nadara MustardNita Oliviah Agostini, PharmD., MS Clinical  Pharmacist Pager:  [phone] Thank you for allowing pharmacy to be part of this patients care team. 07/15/2016,11:30 AM

## 2016-07-15 NOTE — Care Management Note (Signed)
Case Management Note  Patient Details  Name: Johnette Abrahamhomas O Golaszewski MRN: [id number] Date of Birth: 03/08/1960  Subjective/Objective:  From home alone, s/p left ankle arthroscopy and debridement, for dc today, will need HHPT and rolling walker, patient chose [facility] for HHPT, and [facility] for rolling walker.  Dan HumphreysWalker [facility] to patient's [facility] Referral made to Tiffany with [facility] for HHPT.  Soc will begin 24-48 hrs post discharge    PCP  Simone CuriaKeung Lee                  Action/Plan: NCM will follow for dc needs.   Expected Discharge Date:  07/15/16               Expected Discharge Plan:  Home w Home Health Services  In-House Referral:     Discharge planning Services  CM Consult  Post Acute Care Choice:  Durable Medical Equipment, Home Health Choice offered to:  Patient  DME Arranged:  Walker rolling DME Agency:  [facility]  HH [facility]  [facility]  Other - See comment  Status of Service:  Completed, signed off  If discussed at Long Length of Stay Meetings, dates discussed:    Additional Comments:  Leone Havenaylor, Hartlee Amedee Clinton, RN 07/15/2016, 10:21 AM

## 2016-07-15 NOTE — Progress Notes (Signed)
Addition of omitted G-codes    07/14/16 1605  PT G-Codes **NOT FOR INPATIENT CLASS**  Functional Assessment Tool Used AM-PAC 6 Clicks Basic Mobility  Functional Limitation Mobility: Walking and moving around  Mobility: Walking and Moving Around Current Status [phone](G8978) CI  Mobility: Walking and Moving Around Goal Status [phone](G8979) CH  Kenisha Lynds B. Beverely RisenVan Fleet PT, DPT Acute Rehabilitation  [phone] Pager [phone]

## 2016-07-15 NOTE — Care Management Obs Status (Signed)
MEDICARE OBSERVATION STATUS NOTIFICATION   Patient Details  Name: [name] MRN: [id number] Date of Birth: 10/06/1960   Medicare Observation Status Notification Given:  Yes    Leone Havenaylor, Wendolyn Raso Clinton, RN 07/15/2016, 10:19 AM

## 2016-07-15 NOTE — Discharge Summary (Signed)
Discharge Diagnoses:  Active Problems:   Ankle impingement syndrome, left   Impingement of left ankle joint   Surgeries: Procedure(s): LEFT ANKLE ARTHROSCOPY AND DEBRIDEMENT on 07/14/2016    Consultants:   Discharged Condition: Improved  Hospital Course: [name] is an 56 y.o. male who was admitted 07/14/2016 with a chief complaint of impingement pain left ankle, with a final diagnosis of Impingement Left Ankle.  Patient was brought to the operating room on 07/14/2016 and underwent Procedure(s): LEFT ANKLE ARTHROSCOPY AND DEBRIDEMENT.    Patient was given perioperative antibiotics: Anti-infectives    Start     Dose/Rate Route Frequency Ordered Stop   07/14/16 1600  ceFAZolin (ANCEF) IVPB 2g/100 mL premix     2 g 200 mL/hr over 30 Minutes Intravenous Every 6 hours 07/14/16 1019 07/15/16 0421   07/14/16 0930  ceFAZolin (ANCEF) 3 g in dextrose 5 % 50 mL IVPB     3 g 130 mL/hr over 30 Minutes Intravenous On call to O.R. 07/13/16 1055 07/14/16 0947    .  Patient was given sequential compression devices, early ambulation, and aspirin for DVT prophylaxis.  Recent vital signs: Patient Vitals for the past 24 hrs:  BP Temp Temp src Pulse Resp SpO2 Weight  07/15/16 0115 (!) 107/54 97.5 F (36.4 C) Oral 66 20 91 % -  07/14/16 2211 107/67 98.2 F (36.8 C) Oral 71 18 94 % -  07/14/16 1115 - 98 F (36.7 C) - 63 16 93 % -  07/14/16 1100 93/60 - - 65 19 93 % -  07/14/16 1045 102/62 - - (!) 51 11 93 % -  07/14/16 1030 (!) 96/59 - - 69 (!) 25 92 % -  07/14/16 1013 101/66 98 F (36.7 C) - 66 (!) 22 94 % -  07/14/16 0930 (!) 93/48 - - 66 (!) 22 94 % -  07/14/16 0925 - - - 65 (!) 21 96 % -  07/14/16 0920 122/76 - - 65 16 93 % -  07/14/16 0915 117/77 - - 66 14 95 % -  07/14/16 0910 (!) 104/51 - - 65 (!) 23 98 % -  07/14/16 0823 119/65 99 F (37.2 C) Oral 64 18 95 % (!) 320 lb (145.2 kg)  .  Recent laboratory studies: No results found.  Discharge Medications:   Allergies as of  07/15/2016      Reactions   No Known Allergies       Medication List    TAKE these medications   atorvastatin 20 MG tablet Commonly known as:  LIPITOR Take 20 mg by mouth daily.   baclofen 10 MG tablet Commonly known as:  LIORESAL Take 10 mg by mouth daily as needed for muscle spasms.   FLUoxetine 40 MG capsule Commonly known as:  PROZAC Take 80 mg by mouth daily.   gabapentin 400 MG capsule Commonly known as:  NEURONTIN Take 800-1,200 mg by mouth 2 (two) times daily. 800 mg in the am and 1200 mg In the evening   oxyCODONE-acetaminophen 5-325 MG tablet Commonly known as:  ROXICET Take 1 tablet by mouth every 4 (four) hours as needed for severe pain.   traZODone 100 MG tablet Commonly known as:  DESYREL Take 200 mg by mouth at bedtime.   venlafaxine XR 150 MG 24 hr capsule Commonly known as:  EFFEXOR-XR Take 150 mg by mouth daily.   warfarin 7.5 MG tablet Commonly known as:  COUMADIN Take 7.5 mg by mouth every other day. Alternating  with the 5 mg   warfarin 5 MG tablet Commonly known as:  COUMADIN Take 5 mg by mouth every other day. Alternating with the 7.5 mg            Durable Medical Equipment        Start     Ordered   07/15/16 0000  For home use only DME Crutches     07/15/16 0613      Diagnostic Studies: No results found.  Patient benefited maximally from their hospital stay and there were no complications.     Disposition:  Discharge Instructions    Call MD / Call 911    Complete by:  As directed    If you experience chest pain or shortness of breath, CALL 911 and be transported to the hospital emergency room.  If you develope a fever above 101 F, pus (white drainage) or increased drainage or redness at the wound, or calf pain, call your surgeon's office.   Constipation Prevention    Complete by:  As directed    Drink plenty of fluids.  Prune juice may be helpful.  You may use a stool softener, such as Colace (over the counter) 100 mg twice  a day.  Use MiraLax (over the counter) for constipation as needed.   Diet - low sodium heart healthy    Complete by:  As directed    For home use only DME Crutches    Complete by:  As directed    Increase activity slowly as tolerated    Complete by:  As directed    Post-op shoe    Complete by:  As directed    Weight bearing as tolerated    Complete by:  As directed    Laterality:  left   Extremity:  Lower     Follow-up Information    Nadara Mustard, MD In 1 week.   Specialty:  Orthopedic Surgery Contact information: [address] [place] [address] [phone]            Signed: Nadara Mustard 07/15/2016, 6:14 AM

## 2016-07-15 NOTE — Social Work (Signed)
CSW assisted with transportation home.  Patient did not have family to assist with transport nor could afford the cost.  CSW obtained approval from clinical supervisor to set up transport through [employer] to [place] for $54.00.  CSW signing off now as no other social work intervention needed.  Keene BreathPatricia Audry Pecina, LCSW Clinical Social Worker [phone]

## 2016-07-15 NOTE — Progress Notes (Signed)
Provided discharge instruction, all concerns and questions addressed, discharged to home.

## 2016-07-15 NOTE — Progress Notes (Signed)
Physical Therapy Treatment Patient Details Name: [name] MRN: [id number] DOB: [birth year] Today's Date: 07/15/2016    History of Present Illness Pt is a 56 yo male with L ankle pain, s/p 07/14/16 ankle impingement surgery. PHH significant for ankle fx, HTN and L ankle impingement syndrome     PT Comments    Pt performed increased gait and reviewed curb negotiation for safe entry into home.  Pt awaiting CAM walker for R ankle before d/c home.  Plan for no follow up remains appropriate.    Follow Up Recommendations  Home health PT;Supervision - Intermittent     Equipment Recommendations  Rolling walker with 5" wheels    Recommendations for Other Services       Precautions / Restrictions Precautions Precautions: Fall Restrictions Weight Bearing Restrictions: Yes LLE Weight Bearing: Weight bearing as tolerated    Mobility  Bed Mobility               General bed mobility comments: Pt in chair on arrival  Transfers Overall transfer level: Needs assistance Equipment used: Rolling walker (2 wheeled) Transfers: Sit to/from Stand Sit to Stand: Modified independent (Device/Increase time)         General transfer comment: Pt is impulsive, however no LOB noted.    Ambulation/Gait Ambulation/Gait assistance: Supervision   Assistive device: Hemi-walker Gait Pattern/deviations: Step-through pattern;Trunk flexed;Decreased stride length;Decreased dorsiflexion - left Gait velocity: slowed Gait velocity interpretation: Below normal speed for age/gender General Gait Details: Pt required cues for RW safety and L foot clearance.  Pt remains to present with difficulty keeping post op shoe in place.  RN informed MD and patient will d/c home with CAM walker.     Stairs Stairs: Yes   Stair Management: No rails;Forwards;Step to pattern;With walker Number of Stairs: 2 General stair comments: Curb training with cues for sequencing and RW placement.     Wheelchair Mobility    Modified Rankin (Stroke Patients Only)       Balance Overall balance assessment: No apparent balance deficits (not formally assessed)                                          Cognition Arousal/Alertness: Awake/alert Behavior During Therapy: WFL for tasks assessed/performed Overall Cognitive Status: Within Functional Limits for tasks assessed                                        Exercises      General Comments        Pertinent Vitals/Pain Pain Assessment: No/denies pain Pain Intervention(s): Monitored during session    Home Living                      Prior Function            PT Goals (current goals can now be found in the care plan section) Acute Rehab PT Goals Patient Stated Goal: go home Potential to Achieve Goals: Good Progress towards PT goals: Progressing toward goals    Frequency    Min 3X/week      PT Plan Current plan remains appropriate    Co-evaluation              AM-PAC PT "6 Clicks" Daily Activity  Outcome Measure  Difficulty turning over in  bed (including adjusting bedclothes, sheets and blankets)?: None Difficulty moving from lying on back to sitting on the side of the bed? : None Difficulty sitting down on and standing up from a chair with arms (e.g., wheelchair, bedside commode, etc,.)?: None Help needed moving to and from a bed to chair (including a wheelchair)?: None Help needed walking in hospital room?: A Little Help needed climbing 3-5 steps with a railing? : A Little 6 Click Score: 22    End of Session Equipment Utilized During Treatment: Gait belt Activity Tolerance: Patient tolerated treatment well Patient left: in chair;with call bell/phone within reach Nurse Communication: Mobility status PT Visit Diagnosis: Difficulty in walking, not elsewhere classified (R26.2);Other abnormalities of gait and mobility (R26.89);Other symptoms and signs involving the nervous system  (R29.898);Pain Pain - Right/Left: Left Pain - part of body: Ankle and joints of foot     Time: 1020-1035 PT Time Calculation (min) (ACUTE ONLY): 15 min  Charges:  $Gait Training: 8-22 mins                    G Codes:       Joycelyn RuaAimee Tanessa Tidd, PTA pager [phone]    Florestine Aversimee J Gwendoline Judy 07/15/2016, 1:50 PM

## 2016-07-15 NOTE — Progress Notes (Signed)
Orthopedic Tech Progress Note Patient Details:  [name] 11/27/1960 [id number]  Patient ID: [name], male   DOB: 02/13/1960, 56 y.o.   MRN: [id number]   Saul FordyceJennifer C Shahzain Kiester 07/15/2016, 11:56 AMPatient refused cam walker boot. Patient has two at home.

## 2016-07-16 ENCOUNTER — Telehealth (INDEPENDENT_AMBULATORY_CARE_PROVIDER_SITE_OTHER): Payer: Self-pay

## 2016-07-16 DIAGNOSIS — Z86718 Personal history of other venous thrombosis and embolism: Secondary | ICD-10-CM | POA: Diagnosis not present

## 2016-07-16 DIAGNOSIS — G4733 Obstructive sleep apnea (adult) (pediatric): Secondary | ICD-10-CM | POA: Diagnosis not present

## 2016-07-16 DIAGNOSIS — Z471 Aftercare following joint replacement surgery: Secondary | ICD-10-CM | POA: Diagnosis not present

## 2016-07-16 DIAGNOSIS — F329 Major depressive disorder, single episode, unspecified: Secondary | ICD-10-CM | POA: Diagnosis not present

## 2016-07-16 DIAGNOSIS — I10 Essential (primary) hypertension: Secondary | ICD-10-CM | POA: Diagnosis not present

## 2016-07-16 DIAGNOSIS — Z96662 Presence of left artificial ankle joint: Secondary | ICD-10-CM | POA: Diagnosis not present

## 2016-07-16 DIAGNOSIS — Z7901 Long term (current) use of anticoagulants: Secondary | ICD-10-CM | POA: Diagnosis not present

## 2016-07-16 DIAGNOSIS — F17210 Nicotine dependence, cigarettes, uncomplicated: Secondary | ICD-10-CM | POA: Diagnosis not present

## 2016-07-16 NOTE — Telephone Encounter (Signed)
Antonio with [facility] called stated that he went to see patient for initial eval today and he is doing well, walking well, transfers independently but patient is concerned about finances and the $10 copay he will have to pay for each HHPT session.  He asked if since patient was doing so well if he could just do an initial eval and follow up by phone to save patient money. In the event that patient needs him he is going to leave him listed as open. I advised Antonio ok to do this.

## 2016-07-27 ENCOUNTER — Ambulatory Visit (INDEPENDENT_AMBULATORY_CARE_PROVIDER_SITE_OTHER): Payer: PPO | Admitting: Orthopedic Surgery

## 2016-07-27 ENCOUNTER — Encounter (INDEPENDENT_AMBULATORY_CARE_PROVIDER_SITE_OTHER): Payer: Self-pay | Admitting: Orthopedic Surgery

## 2016-07-27 DIAGNOSIS — M25872 Other specified joint disorders, left ankle and foot: Secondary | ICD-10-CM

## 2016-07-27 NOTE — Progress Notes (Signed)
Office Visit Note   Patient: [name]           Date of Birth: 02-21-1960           MRN: [id number] Visit Date: 07/27/2016              Requested by: Simone Curia, MD [address] [place], [place] [address] PCP: Simone Curia, MD  No chief complaint on file.     HPI: Patient is status post left ankle arthroscopy with debridement. He is 2 weeks out. Patient states he does have swelling at the end of the day. He denies any calf pain. Patient states he is ambulating quite well.  Assessment & Plan: Visit Diagnoses:  1. Impingement syndrome of left ankle     Plan: Recommended elevation and ice to help decrease the swelling discussed that he could also use some compression stockings. Follow up if he develops any symptoms such as redness cellulitis drainage or calf tenderness.  Follow-Up Instructions: Return if symptoms worsen or fail to improve.   Ortho Exam  Patient is alert, oriented, no adenopathy, well-dressed, normal affect, normal respiratory effort. Examination patient does have some increased swelling on the left ankle there is no cellulitis no drainage no signs of infection. The sutures are harvested. The calf is soft nontender there is no pain with range of motion of the ankle.  Imaging: No results found.  Labs: No results found for: HGBA1C, ESRSEDRATE, CRP, LABURIC, REPTSTATUS, GRAMSTAIN, CULT, LABORGA  Orders:  No orders of the defined types were placed in this encounter.  No orders of the defined types were placed in this encounter.    Procedures: No procedures performed  Clinical Data: No additional findings.  ROS:  All other systems negative, except as noted in the HPI. Review of Systems  Objective: Vital Signs: There were no vitals taken for this visit.  Specialty Comments:  No specialty comments available.  PMFS History: Patient Active Problem List   Diagnosis Date Noted  . Impingement of left ankle joint 07/14/2016  . Ankle  impingement syndrome, left   . History of total right knee replacement 03/09/2016  . Pain in left ankle and joints of left foot 03/09/2016   Past Medical History:  Diagnosis Date  . Depression   . DVT (deep venous thrombosis) (HCC)    right clavicle and LLE  . Factor V Leiden (HCC)   . Fractures   . Frontal lobe deficit   . Hirschsprung's disease   . Hypertension   . Impingement syndrome of left ankle   . MRSA infection    left hip   . Sleep apnea    wears CPAP    Family History  Problem Relation Age of Onset  . Heart attack Father     Past Surgical History:  Procedure Laterality Date  . ANKLE ARTHROSCOPY Left 07/14/2016   Procedure: LEFT ANKLE ARTHROSCOPY AND DEBRIDEMENT;  Surgeon: Nadara Mustard, MD;  Location: [facility] OR;  Service: Orthopedics;  Laterality: Left;  . COLON SURGERY     Mega colon  . COLONOSCOPY    . HIP SURGERY Left    At [facility]  . KNEE ARTHROSCOPY    . LEFT ANKLE SCOPE WITH DEBRIDMENT  Left 07/14/2016   Social History   Occupational History  . Not on file.   Social History Main Topics  . Smoking status: Current Every Day Smoker    Packs/day: 0.25    Years: 20.00    Types:  Cigarettes  . Smokeless tobacco: Never Used  . Alcohol use No     Comment: social  . Drug use: Yes    Types: Marijuana     Comment: rare  . Sexual activity: Not on file

## 2016-07-30 DIAGNOSIS — I2699 Other pulmonary embolism without acute cor pulmonale: Secondary | ICD-10-CM | POA: Diagnosis not present

## 2016-07-30 DIAGNOSIS — Z7901 Long term (current) use of anticoagulants: Secondary | ICD-10-CM | POA: Diagnosis not present

## 2016-07-30 DIAGNOSIS — E7800 Pure hypercholesterolemia, unspecified: Secondary | ICD-10-CM | POA: Diagnosis not present

## 2016-07-30 DIAGNOSIS — N509 Disorder of male genital organs, unspecified: Secondary | ICD-10-CM | POA: Diagnosis not present

## 2016-07-30 DIAGNOSIS — D6851 Activated protein C resistance: Secondary | ICD-10-CM | POA: Diagnosis not present

## 2016-07-30 DIAGNOSIS — K589 Irritable bowel syndrome without diarrhea: Secondary | ICD-10-CM | POA: Diagnosis not present

## 2016-07-30 DIAGNOSIS — Z6841 Body Mass Index (BMI) 40.0 and over, adult: Secondary | ICD-10-CM | POA: Diagnosis not present

## 2016-07-30 DIAGNOSIS — I10 Essential (primary) hypertension: Secondary | ICD-10-CM | POA: Diagnosis not present

## 2016-07-30 DIAGNOSIS — E291 Testicular hypofunction: Secondary | ICD-10-CM | POA: Diagnosis not present

## 2016-07-30 DIAGNOSIS — I82409 Acute embolism and thrombosis of unspecified deep veins of unspecified lower extremity: Secondary | ICD-10-CM | POA: Diagnosis not present

## 2016-07-30 DIAGNOSIS — R609 Edema, unspecified: Secondary | ICD-10-CM | POA: Diagnosis not present

## 2016-08-31 DIAGNOSIS — D6851 Activated protein C resistance: Secondary | ICD-10-CM | POA: Diagnosis not present

## 2016-08-31 DIAGNOSIS — E291 Testicular hypofunction: Secondary | ICD-10-CM | POA: Diagnosis not present

## 2016-08-31 DIAGNOSIS — R609 Edema, unspecified: Secondary | ICD-10-CM | POA: Diagnosis not present

## 2016-08-31 DIAGNOSIS — N509 Disorder of male genital organs, unspecified: Secondary | ICD-10-CM | POA: Diagnosis not present

## 2016-08-31 DIAGNOSIS — I2699 Other pulmonary embolism without acute cor pulmonale: Secondary | ICD-10-CM | POA: Diagnosis not present

## 2016-08-31 DIAGNOSIS — E8881 Metabolic syndrome: Secondary | ICD-10-CM | POA: Diagnosis not present

## 2016-08-31 DIAGNOSIS — K219 Gastro-esophageal reflux disease without esophagitis: Secondary | ICD-10-CM | POA: Diagnosis not present

## 2016-08-31 DIAGNOSIS — Z7901 Long term (current) use of anticoagulants: Secondary | ICD-10-CM | POA: Diagnosis not present

## 2016-08-31 DIAGNOSIS — E7800 Pure hypercholesterolemia, unspecified: Secondary | ICD-10-CM | POA: Diagnosis not present

## 2016-08-31 DIAGNOSIS — I82409 Acute embolism and thrombosis of unspecified deep veins of unspecified lower extremity: Secondary | ICD-10-CM | POA: Diagnosis not present

## 2016-08-31 DIAGNOSIS — I10 Essential (primary) hypertension: Secondary | ICD-10-CM | POA: Diagnosis not present

## 2016-08-31 DIAGNOSIS — K589 Irritable bowel syndrome without diarrhea: Secondary | ICD-10-CM | POA: Diagnosis not present

## 2016-08-31 DIAGNOSIS — F419 Anxiety disorder, unspecified: Secondary | ICD-10-CM | POA: Diagnosis not present

## 2016-09-08 ENCOUNTER — Telehealth (INDEPENDENT_AMBULATORY_CARE_PROVIDER_SITE_OTHER): Payer: Self-pay | Admitting: Orthopedic Surgery

## 2016-09-08 NOTE — Telephone Encounter (Signed)
Can you call him and make him an appt for recheck pls? thanks

## 2016-09-08 NOTE — Telephone Encounter (Signed)
Patient scheduled 09/28/16 at 1:30pm with Dr Lajoyce Cornersuda for recheck

## 2016-09-08 NOTE — Telephone Encounter (Signed)
Patient called advised he is still experiencing a great deal of pain in his left ankle. Patient  Erik SpanielSaid he has been in pain since the surgery. The number to contact patient is [phone]

## 2016-09-28 ENCOUNTER — Ambulatory Visit (INDEPENDENT_AMBULATORY_CARE_PROVIDER_SITE_OTHER): Payer: PPO | Admitting: Orthopedic Surgery

## 2016-10-06 DIAGNOSIS — I82409 Acute embolism and thrombosis of unspecified deep veins of unspecified lower extremity: Secondary | ICD-10-CM | POA: Diagnosis not present

## 2016-10-06 DIAGNOSIS — G4733 Obstructive sleep apnea (adult) (pediatric): Secondary | ICD-10-CM | POA: Diagnosis not present

## 2016-10-06 DIAGNOSIS — R609 Edema, unspecified: Secondary | ICD-10-CM | POA: Diagnosis not present

## 2016-10-06 DIAGNOSIS — D6851 Activated protein C resistance: Secondary | ICD-10-CM | POA: Diagnosis not present

## 2016-10-06 DIAGNOSIS — E7800 Pure hypercholesterolemia, unspecified: Secondary | ICD-10-CM | POA: Diagnosis not present

## 2016-10-06 DIAGNOSIS — F419 Anxiety disorder, unspecified: Secondary | ICD-10-CM | POA: Diagnosis not present

## 2016-10-06 DIAGNOSIS — N509 Disorder of male genital organs, unspecified: Secondary | ICD-10-CM | POA: Diagnosis not present

## 2016-10-06 DIAGNOSIS — K219 Gastro-esophageal reflux disease without esophagitis: Secondary | ICD-10-CM | POA: Diagnosis not present

## 2016-10-06 DIAGNOSIS — E291 Testicular hypofunction: Secondary | ICD-10-CM | POA: Diagnosis not present

## 2016-10-06 DIAGNOSIS — Z7901 Long term (current) use of anticoagulants: Secondary | ICD-10-CM | POA: Diagnosis not present

## 2016-10-06 DIAGNOSIS — K589 Irritable bowel syndrome without diarrhea: Secondary | ICD-10-CM | POA: Diagnosis not present

## 2016-10-06 DIAGNOSIS — I10 Essential (primary) hypertension: Secondary | ICD-10-CM | POA: Diagnosis not present

## 2016-10-26 ENCOUNTER — Ambulatory Visit (INDEPENDENT_AMBULATORY_CARE_PROVIDER_SITE_OTHER): Payer: PPO | Admitting: Orthopedic Surgery

## 2016-10-26 ENCOUNTER — Encounter (INDEPENDENT_AMBULATORY_CARE_PROVIDER_SITE_OTHER): Payer: Self-pay | Admitting: Orthopedic Surgery

## 2016-10-26 VITALS — Ht 71.0 in | Wt 320.0 lb

## 2016-10-26 DIAGNOSIS — M19072 Primary osteoarthritis, left ankle and foot: Secondary | ICD-10-CM | POA: Diagnosis not present

## 2016-10-26 MED ORDER — METHYLPREDNISOLONE ACETATE 40 MG/ML IJ SUSP
40.0000 mg | INTRAMUSCULAR | Status: AC | PRN
  Administered 2016-10-26: 40 mg via INTRA_ARTICULAR

## 2016-10-26 MED ORDER — LIDOCAINE HCL 1 % IJ SOLN
1.0000 mL | INTRAMUSCULAR | Status: AC | PRN
  Administered 2016-10-26: 1 mL

## 2016-10-26 NOTE — Progress Notes (Signed)
Office Visit Note   Patient: [name]           Date of Birth: April 22, 1960           MRN: [id number] Visit Date: 10/26/2016              Requested by: Simone Curia, MD [address] [place], [place] [address] PCP: Simone Curia, MD  Chief Complaint  Patient presents with  . Left Ankle - Follow-up    Left Ankle Scope 07/14/16      HPI: Patient is a 56 year old gentleman who is status post left ankle arthroscopy and debridement approximately 4 months ago. Patient has been having a new pain over the sinus Tarsi region he states he has pain on uneven terrain pain walking sideways on a hill. He feels like his ankle is unstable. He feels a sharp pinching pain which radiates proximally. Pain only with weightbearing. He states he's been the pain management for back injections and has not had relief with this treatment.  Assessment & Plan: Visit Diagnoses:  1. Arthritis of left subtalar joint     Plan: subtalar joint was injected without complications patient will increase his activities as tolerated.  Follow-Up Instructions: Return if symptoms worsen or fail to improve.   Ortho Exam  Patient is alert, oriented, no adenopathy, well-dressed, normal affect, patient has shortness of breath while seated with forced expiratory breathing respiratory effort. Patient has an antalgic gait he has good pulses he has venous stasis insufficiency with brawny skin color changes but no open ulcers. Patient's ankle is nontender to palpation the posterior tibial tendon and peroneal tendons and Achilles tendon are nontender to palpation. Patient is point tender to palpation of the sinus Tarsi and with subtalar motion.  Imaging: No results found. No images are attached to the encounter.  Labs: No results found for: HGBA1C, ESRSEDRATE, CRP, LABURIC, REPTSTATUS, GRAMSTAIN, CULT, LABORGA  Orders:  Orders Placed This Encounter  Procedures  . Small Joint Injection/Arthrocentesis   No orders of  the defined types were placed in this encounter.    Procedures: Small Joint Inj Date/Time: 10/26/2016 11:43 AM Performed by: DUDA, MARCUS V Authorized by: Nadara Mustard   Consent Given by:  Patient Site marked: the procedure site was marked   Timeout: prior to procedure the correct patient, procedure, and site was verified   Indications:  Pain and diagnostic evaluation Location:  Foot Site:  L subtalar Prep: patient was prepped and draped in usual sterile fashion   Needle Size:  22 G Spinal Needle: No   Approach:  Dorsal Ultrasound Guided: No   Fluoroscopic Guidance: No   Medications:  1 mL lidocaine 1 %; 40 mg methylPREDNISolone acetate 40 MG/ML Aspiration Attempted: No   Patient tolerance:  Patient tolerated the procedure well with no immediate complications    Clinical Data: No additional findings.  ROS:  All other systems negative, except as noted in the HPI. Review of Systems  Objective: Vital Signs: Ht  (1.803 m)   Wt (!) 320 lb (145.2 kg)   BMI 44.63 kg/m   Specialty Comments:  No specialty comments available.  PMFS History: Patient Active Problem List   Diagnosis Date Noted  . 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 Impingement of left ankle joint 07/14/2016  . Ankle impingement syndrome, left   . History of total right knee replacement 03/09/2016  . Pain in left ankle and joints of left foot 03/09/2016  Past Medical History:  Diagnosis Date  . Depression   . DVT (deep venous thrombosis) (HCC)    right clavicle and LLE  . Factor V Leiden (HCC)   . Fractures   . Frontal lobe deficit   . Hirschsprung's disease   . Hypertension   . Impingement syndrome of left ankle   . MRSA infection    left hip   . Sleep apnea    wears CPAP    Family History  Problem Relation Age of Onset  . Heart attack Father     Past Surgical History:  Procedure Laterality Date  . ANKLE ARTHROSCOPY Left 07/14/2016   Procedure: LEFT ANKLE ARTHROSCOPY  AND DEBRIDEMENT;  Surgeon: Nadara Mustard, MD;  Location: [facility] OR;  Service: Orthopedics;  Laterality: Left;  . COLON SURGERY     Mega colon  . COLONOSCOPY    . HIP SURGERY Left    At [facility]  . KNEE ARTHROSCOPY    . LEFT ANKLE SCOPE WITH DEBRIDMENT  Left 07/14/2016   Social History   Occupational History  . Not on file.   Social History Main Topics  . Smoking status: Current Every Day Smoker    Packs/day: 0.25    Years: 20.00    Types: Cigarettes  . Smokeless tobacco: Never Used  . Alcohol use No     Comment: social  . Drug use: Yes    Types: Marijuana     Comment: rare  . Sexual activity: Not on file

## 2016-11-09 DIAGNOSIS — K589 Irritable bowel syndrome without diarrhea: Secondary | ICD-10-CM | POA: Diagnosis not present

## 2016-11-09 DIAGNOSIS — I82409 Acute embolism and thrombosis of unspecified deep veins of unspecified lower extremity: Secondary | ICD-10-CM | POA: Diagnosis not present

## 2016-11-09 DIAGNOSIS — E7800 Pure hypercholesterolemia, unspecified: Secondary | ICD-10-CM | POA: Diagnosis not present

## 2016-11-09 DIAGNOSIS — R609 Edema, unspecified: Secondary | ICD-10-CM | POA: Diagnosis not present

## 2016-11-09 DIAGNOSIS — Z7901 Long term (current) use of anticoagulants: Secondary | ICD-10-CM | POA: Diagnosis not present

## 2016-11-09 DIAGNOSIS — Z791 Long term (current) use of non-steroidal anti-inflammatories (NSAID): Secondary | ICD-10-CM | POA: Diagnosis not present

## 2016-11-09 DIAGNOSIS — J209 Acute bronchitis, unspecified: Secondary | ICD-10-CM | POA: Diagnosis not present

## 2016-11-09 DIAGNOSIS — K219 Gastro-esophageal reflux disease without esophagitis: Secondary | ICD-10-CM | POA: Diagnosis not present

## 2016-11-09 DIAGNOSIS — I10 Essential (primary) hypertension: Secondary | ICD-10-CM | POA: Diagnosis not present

## 2016-11-09 DIAGNOSIS — D6851 Activated protein C resistance: Secondary | ICD-10-CM | POA: Diagnosis not present

## 2016-12-09 DIAGNOSIS — K219 Gastro-esophageal reflux disease without esophagitis: Secondary | ICD-10-CM | POA: Diagnosis not present

## 2016-12-09 DIAGNOSIS — R609 Edema, unspecified: Secondary | ICD-10-CM | POA: Diagnosis not present

## 2016-12-09 DIAGNOSIS — R791 Abnormal coagulation profile: Secondary | ICD-10-CM | POA: Diagnosis not present

## 2016-12-09 DIAGNOSIS — I82409 Acute embolism and thrombosis of unspecified deep veins of unspecified lower extremity: Secondary | ICD-10-CM | POA: Diagnosis not present

## 2016-12-09 DIAGNOSIS — M159 Polyosteoarthritis, unspecified: Secondary | ICD-10-CM | POA: Diagnosis not present

## 2016-12-09 DIAGNOSIS — Z23 Encounter for immunization: Secondary | ICD-10-CM | POA: Diagnosis not present

## 2016-12-09 DIAGNOSIS — D6851 Activated protein C resistance: Secondary | ICD-10-CM | POA: Diagnosis not present

## 2016-12-09 DIAGNOSIS — I10 Essential (primary) hypertension: Secondary | ICD-10-CM | POA: Diagnosis not present

## 2016-12-09 DIAGNOSIS — E7800 Pure hypercholesterolemia, unspecified: Secondary | ICD-10-CM | POA: Diagnosis not present

## 2016-12-09 DIAGNOSIS — E291 Testicular hypofunction: Secondary | ICD-10-CM | POA: Diagnosis not present

## 2016-12-09 DIAGNOSIS — F17201 Nicotine dependence, unspecified, in remission: Secondary | ICD-10-CM | POA: Diagnosis not present

## 2016-12-09 DIAGNOSIS — K589 Irritable bowel syndrome without diarrhea: Secondary | ICD-10-CM | POA: Diagnosis not present

## 2017-01-31 DIAGNOSIS — E7800 Pure hypercholesterolemia, unspecified: Secondary | ICD-10-CM | POA: Diagnosis not present

## 2017-01-31 DIAGNOSIS — E291 Testicular hypofunction: Secondary | ICD-10-CM | POA: Diagnosis not present

## 2017-01-31 DIAGNOSIS — F17201 Nicotine dependence, unspecified, in remission: Secondary | ICD-10-CM | POA: Diagnosis not present

## 2017-01-31 DIAGNOSIS — M159 Polyosteoarthritis, unspecified: Secondary | ICD-10-CM | POA: Diagnosis not present

## 2017-01-31 DIAGNOSIS — I10 Essential (primary) hypertension: Secondary | ICD-10-CM | POA: Diagnosis not present

## 2017-01-31 DIAGNOSIS — K589 Irritable bowel syndrome without diarrhea: Secondary | ICD-10-CM | POA: Diagnosis not present

## 2017-01-31 DIAGNOSIS — R609 Edema, unspecified: Secondary | ICD-10-CM | POA: Diagnosis not present

## 2017-01-31 DIAGNOSIS — D6851 Activated protein C resistance: Secondary | ICD-10-CM | POA: Diagnosis not present

## 2017-01-31 DIAGNOSIS — I82409 Acute embolism and thrombosis of unspecified deep veins of unspecified lower extremity: Secondary | ICD-10-CM | POA: Diagnosis not present

## 2017-01-31 DIAGNOSIS — I2699 Other pulmonary embolism without acute cor pulmonale: Secondary | ICD-10-CM | POA: Diagnosis not present

## 2017-01-31 DIAGNOSIS — K219 Gastro-esophageal reflux disease without esophagitis: Secondary | ICD-10-CM | POA: Diagnosis not present

## 2017-01-31 DIAGNOSIS — R791 Abnormal coagulation profile: Secondary | ICD-10-CM | POA: Diagnosis not present

## 2017-02-10 ENCOUNTER — Telehealth (INDEPENDENT_AMBULATORY_CARE_PROVIDER_SITE_OTHER): Payer: Self-pay | Admitting: Orthopedic Surgery

## 2017-02-10 NOTE — Telephone Encounter (Signed)
Patient would like a call back - he is still having issues with his ankle. He has seen Dr. Magnus IvanBlackman and Dr. Lajoyce Cornersuda and had surgery last year with Dr. Lajoyce Cornersuda. He doesn't know if something was missed on the MRI or if it is a nerve issue and needing a test done. He is disabled and doesn't have much money so he just wants to know what you advise him to do. It is hard for him to walk and hard for him to get to his appointments. He would like a call back concerning the problems he is having. CB [phone]

## 2017-02-11 NOTE — Telephone Encounter (Signed)
I called patient back today. He had ankle arthroscopy, still having the same issues. In the heel, when trying to stretch toes, experience sharp pain up the leg. Most of the time when walking, zinging feeling radiating up leg. He feels like he is hobbling around. Climbing any kind of incline, he has to turn foot out to the side. He is overcompensating with right ankle. He is only getting 3-4 days of relief with injection. He states the injections make his ankle hurt worse. He questions if pain is from nerve damage. He questions if the MRI ordered of ankle, something was missed by radiologist. Advised him to come in for repeat evaluation. He is in certainly financial hardship and has difficulty with transportation. Advised that I understand, that is certainly difficult but we need to see him at least one more time to try to help him. We cannot do anything over the phone and he knows this. Appt made for 2/12 at 145pm advised we would see him sooner, his transportation normally drops him off at least 2-4 hours early. Drive [place] and normally has to take a bus home.

## 2017-02-11 NOTE — Telephone Encounter (Signed)
I called patient back today. He had ankle arthroscopy, still having the same issues. In the heel, when trying to stretch toes, experience sharp pain up the leg. Most of the time when walking, zinging feeling radiating up leg. He feels like he is hobbling around. Climbing any kind of incline, he has to turn foot out to the side. He is overcompensating with right ankle. He is only getting 3-4 days of relief with injection. He states the injections make his ankle hurt worse. He questions if pain is from nerve damage. He questions if the MRI ordered of ankle, something was missed by radiologist.

## 2017-03-03 DIAGNOSIS — K589 Irritable bowel syndrome without diarrhea: Secondary | ICD-10-CM | POA: Diagnosis not present

## 2017-03-03 DIAGNOSIS — K219 Gastro-esophageal reflux disease without esophagitis: Secondary | ICD-10-CM | POA: Diagnosis not present

## 2017-03-03 DIAGNOSIS — R609 Edema, unspecified: Secondary | ICD-10-CM | POA: Diagnosis not present

## 2017-03-03 DIAGNOSIS — D6851 Activated protein C resistance: Secondary | ICD-10-CM | POA: Diagnosis not present

## 2017-03-03 DIAGNOSIS — I82409 Acute embolism and thrombosis of unspecified deep veins of unspecified lower extremity: Secondary | ICD-10-CM | POA: Diagnosis not present

## 2017-03-03 DIAGNOSIS — M159 Polyosteoarthritis, unspecified: Secondary | ICD-10-CM | POA: Diagnosis not present

## 2017-03-03 DIAGNOSIS — Z7901 Long term (current) use of anticoagulants: Secondary | ICD-10-CM | POA: Diagnosis not present

## 2017-03-03 DIAGNOSIS — E7800 Pure hypercholesterolemia, unspecified: Secondary | ICD-10-CM | POA: Diagnosis not present

## 2017-03-03 DIAGNOSIS — I10 Essential (primary) hypertension: Secondary | ICD-10-CM | POA: Diagnosis not present

## 2017-03-03 DIAGNOSIS — I2699 Other pulmonary embolism without acute cor pulmonale: Secondary | ICD-10-CM | POA: Diagnosis not present

## 2017-03-03 DIAGNOSIS — F17201 Nicotine dependence, unspecified, in remission: Secondary | ICD-10-CM | POA: Diagnosis not present

## 2017-03-03 DIAGNOSIS — E291 Testicular hypofunction: Secondary | ICD-10-CM | POA: Diagnosis not present

## 2017-03-08 ENCOUNTER — Encounter (INDEPENDENT_AMBULATORY_CARE_PROVIDER_SITE_OTHER): Payer: Self-pay | Admitting: Orthopedic Surgery

## 2017-03-08 ENCOUNTER — Ambulatory Visit (INDEPENDENT_AMBULATORY_CARE_PROVIDER_SITE_OTHER): Payer: PPO | Admitting: Orthopedic Surgery

## 2017-03-08 VITALS — Ht 71.0 in | Wt 320.0 lb

## 2017-03-08 DIAGNOSIS — M19072 Primary osteoarthritis, left ankle and foot: Secondary | ICD-10-CM | POA: Diagnosis not present

## 2017-03-08 DIAGNOSIS — M25872 Other specified joint disorders, left ankle and foot: Secondary | ICD-10-CM

## 2017-03-08 MED ORDER — METHYLPREDNISOLONE ACETATE 80 MG/ML IJ SUSP
80.0000 mg | INTRAMUSCULAR | Status: AC | PRN
  Administered 2017-03-08: 80 mg via INTRA_ARTICULAR

## 2017-03-08 MED ORDER — LIDOCAINE HCL 1 % IJ SOLN
1.0000 mL | INTRAMUSCULAR | Status: AC | PRN
  Administered 2017-03-08: 1 mL

## 2017-03-08 NOTE — Progress Notes (Signed)
Office Visit Note   Patient: [name]           Date of Birth: 01/23/1961           MRN: [id number] Visit Date: 03/08/2017              Requested by: Erik CuriaLee, Keung, MD [address] [name], [place] [id number] PCP: Erik CuriaLee, Keung, MD  Chief Complaint  Patient presents with  . Left Ankle - Pain    07/14/16 scope and debridement      HPI: Patient is a 57 year old gentleman who is status post left ankle arthroscopy in June 2018.  Patient states he has to walk with his foot externally rotated he has pain with weightbearing in the ankle joint.  He describes as global ankle pain he states that all oral medications have not helped he states his pain is averaging 7/10 day.  Assessment & Plan: Visit Diagnoses:  1. Arthritis of left subtalar joint   2. Impingement syndrome of left ankle     Plan: Subtalar joint was injected follow-up in 3 weeks.  Discussed that if he gets some relief with his pain with a subtalar injection that he would need a tibial calcaneal fusion.  If the subtalar injection did not change his ankle symptoms then we should be able to provide relief with an ankle fusion.  Follow-Up Instructions: Return in about 3 weeks (around 03/29/2017).   Ortho Exam  Patient is alert, oriented, no adenopathy, well-dressed, normal affect, normal respiratory effort. On examination patient has venous stasis changes with brawny skin color changes and pitting edema left leg there are no venous ulcers.  Patient is globally tender to palpation anteriorly over the ankle joint as well as over the sinus Tarsi inversion and eversion is minimally tender however maximum plantarflexion dorsiflexion reproduces his symptoms.  Review of the MRI scan shows effusion within the ankle and subtalar joint.  The ankle joint shows subcondylar cystic changes.  Imaging: No results found. No images are attached to the encounter.  Labs: No results found for: HGBA1C, ESRSEDRATE, CRP, LABURIC, REPTSTATUS,  GRAMSTAIN, CULT, LABORGA  @LABSALLVALUES (HGBA1)@  Body mass index is 44.63 kg/m.  Orders:  No orders of the defined types were placed in this encounter.  No orders of the defined types were placed in this encounter.    Procedures: Small Joint Inj: L subtalar on 03/08/2017 11:19 AM Indications: pain and diagnostic evaluation Details: 22 G needle, dorsal approach  Spinal Needle: No  Medications: 1 mL lidocaine 1 %; 80 mg methylPREDNISolone acetate 80 MG/ML Outcome: tolerated well, no immediate complications Procedure, treatment alternatives, risks and benefits explained, specific risks discussed. Consent was given by the patient. Immediately prior to procedure a time out was called to verify the correct patient, procedure, equipment, support staff and site/side marked as required. Patient was prepped and draped in the usual sterile fashion.      Clinical Data: No additional findings.  ROS:  All other systems negative, except as noted in the HPI. Review of Systems  Objective: Vital Signs: Ht 5\' 11"  (1.803 m)   Wt (!) 320 lb (145.2 kg)   BMI 44.63 kg/m   Specialty Comments:  No specialty comments available.  PMFS History: Patient Active Problem List   Diagnosis Date Noted  . 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 Impingement of left ankle joint 07/14/2016  . Ankle impingement syndrome, left   . History of total right knee replacement 03/09/2016  .  Pain in left ankle and joints of left foot 03/09/2016   Past Medical History:  Diagnosis Date  . Depression   . DVT (deep venous thrombosis) (HCC)    right clavicle and LLE  . Factor V Leiden (HCC)   . Fractures   . Frontal lobe deficit   . Hirschsprung's disease   . Hypertension   . Impingement syndrome of left ankle   . MRSA infection    left hip   . Sleep apnea    wears CPAP    Family History  Problem Relation Age of Onset  . Heart attack Father     Past Surgical History:  Procedure Laterality  Date  . ANKLE ARTHROSCOPY Left 07/14/2016   Procedure: LEFT ANKLE ARTHROSCOPY AND DEBRIDEMENT;  Surgeon: Nadara Mustard, MD;  Location: [facility] OR;  Service: Orthopedics;  Laterality: Left;  . COLON SURGERY     Mega colon  . COLONOSCOPY    . HIP SURGERY Left    At [facility]  . KNEE ARTHROSCOPY    . LEFT ANKLE SCOPE WITH DEBRIDMENT  Left 07/14/2016   Social History   Occupational History  . Not on file  Tobacco Use  . Smoking status: Current Every Day Smoker    Packs/day: 0.25    Years: 20.00    Pack years: 5.00    Types: Cigarettes  . Smokeless tobacco: Never Used  Substance and Sexual Activity  . Alcohol use: No    Comment: social  . Drug use: Yes    Types: Marijuana    Comment: rare  . Sexual activity: Not on file

## 2017-03-29 ENCOUNTER — Ambulatory Visit (INDEPENDENT_AMBULATORY_CARE_PROVIDER_SITE_OTHER): Payer: PPO | Admitting: Orthopedic Surgery

## 2017-03-29 ENCOUNTER — Encounter (INDEPENDENT_AMBULATORY_CARE_PROVIDER_SITE_OTHER): Payer: Self-pay | Admitting: Orthopedic Surgery

## 2017-03-29 VITALS — Ht 71.0 in | Wt 320.0 lb

## 2017-03-29 DIAGNOSIS — M19072 Primary osteoarthritis, left ankle and foot: Secondary | ICD-10-CM | POA: Diagnosis not present

## 2017-03-29 DIAGNOSIS — M25572 Pain in left ankle and joints of left foot: Secondary | ICD-10-CM

## 2017-03-29 NOTE — Progress Notes (Addendum)
Office Visit Note   Patient: [name]           Date of Birth: 11/10/1960           MRN: [id number] Visit Date: 03/29/2017              Requested by: Simone CuriaLee, Keung, MD [address] [place], [place] [id number] PCP: Simone CuriaLee, Keung, MD  Chief Complaint  Patient presents with  . Left Ankle - Pain, Follow-up    S/p cortisone injection subtalar joint 03/08/17      HPI: Patient is a 57 year old gentleman who presents in follow-up for left ankle arthritis and subtalar arthritis.  Patient had temporary relief with left ankle arthroscopy but still has intra-articular pain he also had temporary relief with a subtalar injection which provided about 1 weeks of relief.  Of note patient states that he did underwent a osteochondral grafting for the talus of the right ankle at [facility] years ago.  Assessment & Plan: Visit Diagnoses:  1. Arthritis of left subtalar joint   2. Pain in left ankle and joints of left foot     Plan: Discussed with patient with his cystic degenerative changes of the tibiotalar joint and subtalar arthritis that his best option would be to pursue a tibial calcaneal fusion.  Discussed that he would be strict nonweightbearing for 2 months and will advance weightbearing as tolerated after that time.  Patient does live alone and would require discharge to skilled nursing to provide for care before he was safe to go home.  Risks and benefits of surgery were discussed patient states he understands discussed that he would not have ankle range of motion or side to side foot motion on uneven terrain after surgery.  Follow-Up Instructions: Return in about 2 weeks (around 04/12/2017).   Ortho Exam  Patient is alert, oriented, no adenopathy, well-dressed, normal affect, normal respiratory effort. Examination patient has a palpable dorsalis pedis pulse.  He has pain with attempted range of motion of the ankle he is tender to palpation anteriorly.  The sinus Tarsi is tender to  palpation in the is pain with attempted subtalar motion.  Patient walks with his left lower extremity externally rotated and basically ambulates with a stiff gait without flexion of the ankle.  There is no redness no cellulitis no signs of infection.  Imaging: No results found. No images are attached to the encounter.  Labs: No results found for: HGBA1C, ESRSEDRATE, CRP, LABURIC, REPTSTATUS, GRAMSTAIN, CULT, LABORGA  @LABSALLVALUES (HGBA1)@  Body mass index is 44.63 kg/m.  Orders:  No orders of the defined types were placed in this encounter.  No orders of the defined types were placed in this encounter.    Procedures: No procedures performed  Clinical Data: No additional findings.  ROS:  All other systems negative, except as noted in the HPI. Review of Systems  Objective: Vital Signs: Ht 5\' 11"  (1.803 m)   Wt (!) 320 lb (145.2 kg)   BMI 44.63 kg/m   Specialty Comments:  No specialty comments available.  PMFS History: Patient Active Problem List   Diagnosis Date Noted  . 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 Impingement of left ankle joint 07/14/2016  . Ankle impingement syndrome, left   . History of total right knee replacement 03/09/2016  . Pain in left ankle and joints of left foot 03/09/2016   Past Medical History:  Diagnosis Date  . Depression   . DVT (deep venous thrombosis) (HCC)  right clavicle and LLE  . Factor V Leiden (HCC)   . Fractures   . Frontal lobe deficit   . Hirschsprung's disease   . Hypertension   . Impingement syndrome of left ankle   . MRSA infection    left hip   . Sleep apnea    wears CPAP    Family History  Problem Relation Age of Onset  . Heart attack Father     Past Surgical History:  Procedure Laterality Date  . ANKLE ARTHROSCOPY Left 07/14/2016   Procedure: LEFT ANKLE ARTHROSCOPY AND DEBRIDEMENT;  Surgeon: Nadara Mustard, MD;  Location: [facility] OR;  Service: Orthopedics;  Laterality: Left;  . COLON SURGERY      Mega colon  . COLONOSCOPY    . HIP SURGERY Left    At [facility]  . KNEE ARTHROSCOPY    . LEFT ANKLE SCOPE WITH DEBRIDMENT  Left 07/14/2016   Social History   Occupational History  . Not on file  Tobacco Use  . Smoking status: Current Every Day Smoker    Packs/day: 0.25    Years: 20.00    Pack years: 5.00    Types: Cigarettes  . Smokeless tobacco: Never Used  Substance and Sexual Activity  . Alcohol use: No    Comment: social  . Drug use: Yes    Types: Marijuana    Comment: rare  . Sexual activity: Not on file

## 2017-03-30 ENCOUNTER — Encounter (INDEPENDENT_AMBULATORY_CARE_PROVIDER_SITE_OTHER): Payer: Self-pay | Admitting: Orthopedic Surgery

## 2017-03-31 DIAGNOSIS — M159 Polyosteoarthritis, unspecified: Secondary | ICD-10-CM | POA: Diagnosis not present

## 2017-03-31 DIAGNOSIS — I82409 Acute embolism and thrombosis of unspecified deep veins of unspecified lower extremity: Secondary | ICD-10-CM | POA: Diagnosis not present

## 2017-03-31 DIAGNOSIS — E7800 Pure hypercholesterolemia, unspecified: Secondary | ICD-10-CM | POA: Diagnosis not present

## 2017-03-31 DIAGNOSIS — K219 Gastro-esophageal reflux disease without esophagitis: Secondary | ICD-10-CM | POA: Diagnosis not present

## 2017-03-31 DIAGNOSIS — Z7901 Long term (current) use of anticoagulants: Secondary | ICD-10-CM | POA: Diagnosis not present

## 2017-03-31 DIAGNOSIS — K589 Irritable bowel syndrome without diarrhea: Secondary | ICD-10-CM | POA: Diagnosis not present

## 2017-03-31 DIAGNOSIS — Z6841 Body Mass Index (BMI) 40.0 and over, adult: Secondary | ICD-10-CM | POA: Diagnosis not present

## 2017-03-31 DIAGNOSIS — D6851 Activated protein C resistance: Secondary | ICD-10-CM | POA: Diagnosis not present

## 2017-03-31 DIAGNOSIS — R609 Edema, unspecified: Secondary | ICD-10-CM | POA: Diagnosis not present

## 2017-03-31 DIAGNOSIS — E291 Testicular hypofunction: Secondary | ICD-10-CM | POA: Diagnosis not present

## 2017-03-31 DIAGNOSIS — I2699 Other pulmonary embolism without acute cor pulmonale: Secondary | ICD-10-CM | POA: Diagnosis not present

## 2017-03-31 DIAGNOSIS — I10 Essential (primary) hypertension: Secondary | ICD-10-CM | POA: Diagnosis not present

## 2017-04-06 ENCOUNTER — Encounter (INDEPENDENT_AMBULATORY_CARE_PROVIDER_SITE_OTHER): Payer: Self-pay | Admitting: Orthopedic Surgery

## 2017-04-12 ENCOUNTER — Encounter: Payer: Self-pay | Admitting: Gastroenterology

## 2017-04-19 ENCOUNTER — Other Ambulatory Visit (INDEPENDENT_AMBULATORY_CARE_PROVIDER_SITE_OTHER): Payer: Self-pay | Admitting: Orthopedic Surgery

## 2017-04-19 ENCOUNTER — Other Ambulatory Visit: Payer: Self-pay

## 2017-04-19 ENCOUNTER — Encounter (HOSPITAL_COMMUNITY): Payer: Self-pay | Admitting: *Deleted

## 2017-04-19 DIAGNOSIS — M19172 Post-traumatic osteoarthritis, left ankle and foot: Secondary | ICD-10-CM

## 2017-04-19 MED ORDER — DEXTROSE 5 % IV SOLN
3.0000 g | INTRAVENOUS | Status: AC
  Administered 2017-04-20: 3 g via INTRAVENOUS
  Filled 2017-04-19 (×2): qty 3000

## 2017-04-19 NOTE — Progress Notes (Signed)
Pt denies SOB, chest pain, and being under the care of a cardiologist. Pt stated that a stress test was performed > 10 years ago but denies having an echo and cardiac cath. Pt denies having a chest x ray and EKG within the last year. Pt denies having recent labs. Pt stated that he was instructed to continue taking Coumadin. Pt made aware to stop taking vitamins, fish oil and herbal medications. Do not take any NSAIDs ie: Ibuprofen, Advil, Naproxen, BC and Goody Powder. Pt verbalized understanding of all pre-op instructions.

## 2017-04-20 ENCOUNTER — Other Ambulatory Visit: Payer: Self-pay

## 2017-04-20 ENCOUNTER — Inpatient Hospital Stay (HOSPITAL_COMMUNITY): Payer: PPO | Admitting: Certified Registered"

## 2017-04-20 ENCOUNTER — Encounter (HOSPITAL_COMMUNITY)
Admission: RE | Disposition: A | Discharge status: Skilled Nursing Facility | Payer: Self-pay | Source: Ambulatory Visit | Attending: Orthopedic Surgery

## 2017-04-20 ENCOUNTER — Inpatient Hospital Stay (HOSPITAL_COMMUNITY)
Admission: RE | Admit: 2017-04-20 | Discharge: 2017-04-22 | DRG: 493 | Disposition: A | Discharge status: Skilled Nursing Facility | Payer: PPO | Source: Ambulatory Visit | Attending: Orthopedic Surgery | Admitting: Orthopedic Surgery

## 2017-04-20 ENCOUNTER — Encounter (HOSPITAL_COMMUNITY): Payer: Self-pay | Admitting: *Deleted

## 2017-04-20 DIAGNOSIS — F17210 Nicotine dependence, cigarettes, uncomplicated: Secondary | ICD-10-CM | POA: Diagnosis present

## 2017-04-20 DIAGNOSIS — F329 Major depressive disorder, single episode, unspecified: Secondary | ICD-10-CM | POA: Diagnosis present

## 2017-04-20 DIAGNOSIS — M19072 Primary osteoarthritis, left ankle and foot: Secondary | ICD-10-CM | POA: Diagnosis not present

## 2017-04-20 DIAGNOSIS — D6851 Activated protein C resistance: Secondary | ICD-10-CM | POA: Diagnosis not present

## 2017-04-20 DIAGNOSIS — G8911 Acute pain due to trauma: Secondary | ICD-10-CM | POA: Diagnosis not present

## 2017-04-20 DIAGNOSIS — F419 Anxiety disorder, unspecified: Secondary | ICD-10-CM | POA: Diagnosis present

## 2017-04-20 DIAGNOSIS — I10 Essential (primary) hypertension: Secondary | ICD-10-CM | POA: Diagnosis not present

## 2017-04-20 DIAGNOSIS — Z79899 Other long term (current) drug therapy: Secondary | ICD-10-CM

## 2017-04-20 DIAGNOSIS — G4730 Sleep apnea, unspecified: Secondary | ICD-10-CM | POA: Diagnosis not present

## 2017-04-20 DIAGNOSIS — M19172 Post-traumatic osteoarthritis, left ankle and foot: Secondary | ICD-10-CM | POA: Diagnosis not present

## 2017-04-20 DIAGNOSIS — M25572 Pain in left ankle and joints of left foot: Secondary | ICD-10-CM | POA: Diagnosis present

## 2017-04-20 DIAGNOSIS — Z7901 Long term (current) use of anticoagulants: Secondary | ICD-10-CM

## 2017-04-20 DIAGNOSIS — Z981 Arthrodesis status: Secondary | ICD-10-CM

## 2017-04-20 DIAGNOSIS — Z8614 Personal history of Methicillin resistant Staphylococcus aureus infection: Secondary | ICD-10-CM | POA: Diagnosis not present

## 2017-04-20 DIAGNOSIS — Z6841 Body Mass Index (BMI) 40.0 and over, adult: Secondary | ICD-10-CM

## 2017-04-20 DIAGNOSIS — S81839A Puncture wound without foreign body, unspecified lower leg, initial encounter: Secondary | ICD-10-CM | POA: Diagnosis not present

## 2017-04-20 HISTORY — DX: Anxiety disorder, unspecified: F41.9

## 2017-04-20 HISTORY — DX: Unspecified osteoarthritis, unspecified site: M19.90

## 2017-04-20 HISTORY — PX: ANKLE FUSION: SHX5718

## 2017-04-20 LAB — PROTIME-INR
INR: 2.48
INR: 2.74
Prothrombin Time: 26.7 seconds — ABNORMAL HIGH (ref 11.4–15.2)
Prothrombin Time: 28.8 seconds — ABNORMAL HIGH (ref 11.4–15.2)

## 2017-04-20 LAB — CBC
HCT: 49.4 % (ref 39.0–52.0)
Hemoglobin: 16.5 g/dL (ref 13.0–17.0)
MCH: 31 pg (ref 26.0–34.0)
MCHC: 33.4 g/dL (ref 30.0–36.0)
MCV: 92.7 fL (ref 78.0–100.0)
Platelets: 223 10*3/uL (ref 150–400)
RBC: 5.33 MIL/uL (ref 4.22–5.81)
RDW: 14 % (ref 11.5–15.5)
WBC: 10.9 10*3/uL — AB (ref 4.0–10.5)

## 2017-04-20 LAB — COMPREHENSIVE METABOLIC PANEL
ALBUMIN: 3.9 g/dL (ref 3.5–5.0)
ALT: 28 U/L (ref 17–63)
ANION GAP: 12 (ref 5–15)
AST: 28 U/L (ref 15–41)
Alkaline Phosphatase: 73 U/L (ref 38–126)
BUN: 16 mg/dL (ref 6–20)
CO2: 24 mmol/L (ref 22–32)
Calcium: 9.3 mg/dL (ref 8.9–10.3)
Chloride: 102 mmol/L (ref 101–111)
Creatinine, Ser: 0.81 mg/dL (ref 0.61–1.24)
GFR calc Af Amer: >60 mL/min (ref >60)
GFR calc non Af Amer: >60 mL/min (ref >60)
GLUCOSE: 111 mg/dL — AB (ref 65–99)
POTASSIUM: 3.7 mmol/L (ref 3.5–5.1)
SODIUM: 138 mmol/L (ref 135–145)
Total Bilirubin: 0.5 mg/dL (ref 0.3–1.2)
Total Protein: 7.1 g/dL (ref 6.5–8.1)

## 2017-04-20 LAB — APTT: APTT: 63 s — AB (ref 24–36)

## 2017-04-20 SURGERY — ANKLE FUSION
Anesthesia: General | Laterality: Left

## 2017-04-20 MED ORDER — 0.9 % SODIUM CHLORIDE (POUR BTL) OPTIME
TOPICAL | Status: DC | PRN
  Administered 2017-04-20: 1000 mL

## 2017-04-20 MED ORDER — HYDROMORPHONE HCL 1 MG/ML IJ SOLN
INTRAMUSCULAR | Status: AC
  Filled 2017-04-20: qty 1

## 2017-04-20 MED ORDER — OXYCODONE HCL 5 MG PO TABS: 5.0000 mg | ORAL_TABLET | ORAL | Status: DC | PRN

## 2017-04-20 MED ORDER — ONDANSETRON HCL 4 MG/2ML IJ SOLN: 4.0000 mg | Freq: Four times a day (QID) | INTRAMUSCULAR | Status: DC | PRN

## 2017-04-20 MED ORDER — LACTATED RINGERS IV SOLN
INTRAVENOUS | Status: DC | PRN
  Administered 2017-04-20 (×2): via INTRAVENOUS

## 2017-04-20 MED ORDER — PHENYLEPHRINE 40 MCG/ML (10ML) SYRINGE FOR IV PUSH (FOR BLOOD PRESSURE SUPPORT)
PREFILLED_SYRINGE | INTRAVENOUS | Status: AC
  Filled 2017-04-20: qty 10

## 2017-04-20 MED ORDER — ONDANSETRON HCL 4 MG/2ML IJ SOLN
INTRAMUSCULAR | Status: DC | PRN
  Administered 2017-04-20: 4 mg via INTRAVENOUS

## 2017-04-20 MED ORDER — WARFARIN - PHYSICIAN DOSING INPATIENT: Freq: Every day | Status: DC

## 2017-04-20 MED ORDER — WARFARIN SODIUM 5 MG PO TABS: 5.0000 mg | ORAL_TABLET | ORAL | Status: DC

## 2017-04-20 MED ORDER — LIDOCAINE HCL (CARDIAC) 20 MG/ML IV SOLN
INTRAVENOUS | Status: AC
  Filled 2017-04-20: qty 5

## 2017-04-20 MED ORDER — ROCURONIUM BROMIDE 10 MG/ML (PF) SYRINGE
PREFILLED_SYRINGE | INTRAVENOUS | Status: AC
  Filled 2017-04-20: qty 5

## 2017-04-20 MED ORDER — MIDAZOLAM HCL 2 MG/2ML IJ SOLN
INTRAMUSCULAR | Status: AC
  Filled 2017-04-20: qty 2

## 2017-04-20 MED ORDER — ATORVASTATIN CALCIUM 20 MG PO TABS
20.0000 mg | ORAL_TABLET | Freq: Every day | ORAL | Status: DC
  Administered 2017-04-21 – 2017-04-22 (×2): 20 mg via ORAL
  Filled 2017-04-20 (×2): qty 1

## 2017-04-20 MED ORDER — MAGNESIUM CITRATE PO SOLN: 1.0000 | Freq: Once | ORAL | Status: DC | PRN

## 2017-04-20 MED ORDER — DEXAMETHASONE SODIUM PHOSPHATE 4 MG/ML IJ SOLN
INTRAMUSCULAR | Status: DC | PRN
  Administered 2017-04-20: 8 mg via INTRAVENOUS

## 2017-04-20 MED ORDER — HYDROMORPHONE HCL 1 MG/ML IJ SOLN
0.2500 mg | INTRAMUSCULAR | Status: DC | PRN
  Administered 2017-04-20 (×4): 0.5 mg via INTRAVENOUS

## 2017-04-20 MED ORDER — OXYCODONE HCL 5 MG PO TABS
10.0000 mg | ORAL_TABLET | ORAL | Status: DC | PRN
  Administered 2017-04-20 – 2017-04-22 (×10): 15 mg via ORAL
  Filled 2017-04-20 (×11): qty 3

## 2017-04-20 MED ORDER — ACETAMINOPHEN 10 MG/ML IV SOLN: 1000.0000 mg | Freq: Once | INTRAVENOUS | Status: DC | PRN

## 2017-04-20 MED ORDER — GABAPENTIN 400 MG PO CAPS
800.0000 mg | ORAL_CAPSULE | Freq: Every day | ORAL | Status: DC
  Administered 2017-04-21 – 2017-04-22 (×2): 800 mg via ORAL
  Filled 2017-04-20 (×2): qty 2

## 2017-04-20 MED ORDER — LACTATED RINGERS IV SOLN
INTRAVENOUS | Status: DC
  Administered 2017-04-20: 08:00:00 via INTRAVENOUS

## 2017-04-20 MED ORDER — ARTIFICIAL TEARS OPHTHALMIC OINT
TOPICAL_OINTMENT | OPHTHALMIC | Status: DC | PRN
  Administered 2017-04-20: 1 via OPHTHALMIC

## 2017-04-20 MED ORDER — HYDROMORPHONE HCL 1 MG/ML IJ SOLN
0.5000 mg | INTRAMUSCULAR | Status: DC | PRN
  Administered 2017-04-20 – 2017-04-21 (×2): 1 mg via INTRAVENOUS
  Filled 2017-04-20 (×2): qty 1

## 2017-04-20 MED ORDER — PROPOFOL 10 MG/ML IV BOLUS
INTRAVENOUS | Status: DC | PRN
  Administered 2017-04-20: 300 mg via INTRAVENOUS

## 2017-04-20 MED ORDER — MIDAZOLAM HCL 5 MG/5ML IJ SOLN
INTRAMUSCULAR | Status: DC | PRN
  Administered 2017-04-20: 2 mg via INTRAVENOUS

## 2017-04-20 MED ORDER — DEXAMETHASONE SODIUM PHOSPHATE 10 MG/ML IJ SOLN
INTRAMUSCULAR | Status: AC
  Filled 2017-04-20: qty 1

## 2017-04-20 MED ORDER — PROPOFOL 10 MG/ML IV BOLUS
INTRAVENOUS | Status: AC
  Filled 2017-04-20: qty 20

## 2017-04-20 MED ORDER — SUCCINYLCHOLINE CHLORIDE 200 MG/10ML IV SOSY
PREFILLED_SYRINGE | INTRAVENOUS | Status: AC
  Filled 2017-04-20: qty 10

## 2017-04-20 MED ORDER — METHOCARBAMOL 500 MG PO TABS
500.0000 mg | ORAL_TABLET | Freq: Four times a day (QID) | ORAL | Status: DC | PRN
  Administered 2017-04-20 – 2017-04-22 (×8): 500 mg via ORAL
  Filled 2017-04-20 (×7): qty 1

## 2017-04-20 MED ORDER — WARFARIN - PHARMACIST DOSING INPATIENT
Freq: Every day | Status: DC
  Administered 2017-04-20: 18:00:00

## 2017-04-20 MED ORDER — EPHEDRINE 5 MG/ML INJ
INTRAVENOUS | Status: AC
  Filled 2017-04-20: qty 10

## 2017-04-20 MED ORDER — SUGAMMADEX SODIUM 500 MG/5ML IV SOLN
INTRAVENOUS | Status: AC
  Filled 2017-04-20: qty 5

## 2017-04-20 MED ORDER — METHOCARBAMOL 1000 MG/10ML IJ SOLN: 500.0000 mg | Freq: Four times a day (QID) | INTRAVENOUS | Status: DC | PRN

## 2017-04-20 MED ORDER — FENTANYL CITRATE (PF) 250 MCG/5ML IJ SOLN
INTRAMUSCULAR | Status: AC
  Filled 2017-04-20: qty 5

## 2017-04-20 MED ORDER — PROPOFOL 1000 MG/100ML IV EMUL
INTRAVENOUS | Status: AC
  Filled 2017-04-20: qty 100

## 2017-04-20 MED ORDER — METOCLOPRAMIDE HCL 5 MG/ML IJ SOLN: 5.0000 mg | Freq: Three times a day (TID) | INTRAMUSCULAR | Status: DC | PRN

## 2017-04-20 MED ORDER — GABAPENTIN 400 MG PO CAPS
1200.0000 mg | ORAL_CAPSULE | Freq: Every day | ORAL | Status: DC
  Administered 2017-04-20 – 2017-04-21 (×2): 1200 mg via ORAL
  Filled 2017-04-20: qty 3
  Filled 2017-04-20: qty 12

## 2017-04-20 MED ORDER — CHLORHEXIDINE GLUCONATE 4 % EX LIQD: 60.0000 mL | Freq: Once | CUTANEOUS | Status: DC

## 2017-04-20 MED ORDER — SUCCINYLCHOLINE CHLORIDE 200 MG/10ML IV SOSY
PREFILLED_SYRINGE | INTRAVENOUS | Status: DC | PRN
  Administered 2017-04-20: 200 mg via INTRAVENOUS

## 2017-04-20 MED ORDER — FLUOXETINE HCL 20 MG PO CAPS
80.0000 mg | ORAL_CAPSULE | Freq: Every day | ORAL | Status: DC
  Administered 2017-04-21 – 2017-04-22 (×2): 80 mg via ORAL
  Filled 2017-04-20 (×2): qty 4

## 2017-04-20 MED ORDER — LIDOCAINE 2% (20 MG/ML) 5 ML SYRINGE
INTRAMUSCULAR | Status: DC | PRN
  Administered 2017-04-20: 100 mg via INTRAVENOUS

## 2017-04-20 MED ORDER — POLYETHYLENE GLYCOL 3350 17 G PO PACK: 17.0000 g | PACK | Freq: Every day | ORAL | Status: DC | PRN

## 2017-04-20 MED ORDER — SUGAMMADEX SODIUM 200 MG/2ML IV SOLN
INTRAVENOUS | Status: DC | PRN
  Administered 2017-04-20: 500 mg via INTRAVENOUS

## 2017-04-20 MED ORDER — DOCUSATE SODIUM 100 MG PO CAPS
100.0000 mg | ORAL_CAPSULE | Freq: Two times a day (BID) | ORAL | Status: DC
  Administered 2017-04-20 – 2017-04-21 (×3): 100 mg via ORAL
  Filled 2017-04-20 (×4): qty 1

## 2017-04-20 MED ORDER — WARFARIN SODIUM 7.5 MG PO TABS
7.5000 mg | ORAL_TABLET | ORAL | Status: DC
Start: 2017-04-20 — End: 2017-04-21
  Administered 2017-04-20: 7.5 mg via ORAL
  Filled 2017-04-20: qty 1

## 2017-04-20 MED ORDER — SODIUM CHLORIDE 0.9 % IV SOLN
INTRAVENOUS | Status: DC
  Administered 2017-04-20: 16:00:00 via INTRAVENOUS

## 2017-04-20 MED ORDER — TRAZODONE HCL 100 MG PO TABS
200.0000 mg | ORAL_TABLET | Freq: Every day | ORAL | Status: DC
  Administered 2017-04-20 – 2017-04-21 (×2): 200 mg via ORAL
  Filled 2017-04-20 (×2): qty 2

## 2017-04-20 MED ORDER — FENTANYL CITRATE (PF) 100 MCG/2ML IJ SOLN
INTRAMUSCULAR | Status: DC | PRN
  Administered 2017-04-20 (×2): 100 ug via INTRAVENOUS
  Administered 2017-04-20: 50 ug via INTRAVENOUS
  Administered 2017-04-20: 100 ug via INTRAVENOUS

## 2017-04-20 MED ORDER — GABAPENTIN 400 MG PO CAPS: 800.0000 mg | ORAL_CAPSULE | ORAL | Status: DC

## 2017-04-20 MED ORDER — ONDANSETRON HCL 4 MG PO TABS: 4.0000 mg | ORAL_TABLET | Freq: Four times a day (QID) | ORAL | Status: DC | PRN

## 2017-04-20 MED ORDER — ARTIFICIAL TEARS OPHTHALMIC OINT
TOPICAL_OINTMENT | OPHTHALMIC | Status: AC
  Filled 2017-04-20: qty 3.5

## 2017-04-20 MED ORDER — ACETAMINOPHEN 325 MG PO TABS
325.0000 mg | ORAL_TABLET | Freq: Four times a day (QID) | ORAL | Status: DC | PRN
  Administered 2017-04-22 (×2): 650 mg via ORAL
  Filled 2017-04-20 (×2): qty 2

## 2017-04-20 MED ORDER — PHENYLEPHRINE 40 MCG/ML (10ML) SYRINGE FOR IV PUSH (FOR BLOOD PRESSURE SUPPORT)
PREFILLED_SYRINGE | INTRAVENOUS | Status: DC | PRN
  Administered 2017-04-20 (×2): 80 ug via INTRAVENOUS
  Administered 2017-04-20: 40 ug via INTRAVENOUS
  Administered 2017-04-20 (×2): 80 ug via INTRAVENOUS
  Administered 2017-04-20: 120 ug via INTRAVENOUS

## 2017-04-20 MED ORDER — PROMETHAZINE HCL 25 MG/ML IJ SOLN: 6.2500 mg | INTRAMUSCULAR | Status: DC | PRN

## 2017-04-20 MED ORDER — DEXTROSE 5 % IV SOLN
INTRAVENOUS | Status: DC | PRN
  Administered 2017-04-20: 40 ug/min via INTRAVENOUS

## 2017-04-20 MED ORDER — GLYCOPYRROLATE 0.2 MG/ML IV SOSY
PREFILLED_SYRINGE | INTRAVENOUS | Status: DC | PRN
  Administered 2017-04-20: .2 mg via INTRAVENOUS

## 2017-04-20 MED ORDER — METOCLOPRAMIDE HCL 5 MG PO TABS: 5.0000 mg | ORAL_TABLET | Freq: Three times a day (TID) | ORAL | Status: DC | PRN

## 2017-04-20 MED ORDER — VENLAFAXINE HCL ER 150 MG PO CP24
150.0000 mg | ORAL_CAPSULE | Freq: Every day | ORAL | Status: DC
  Administered 2017-04-21 – 2017-04-22 (×2): 150 mg via ORAL
  Filled 2017-04-20 (×2): qty 1

## 2017-04-20 MED ORDER — METHOCARBAMOL 500 MG PO TABS
ORAL_TABLET | ORAL | Status: AC
  Filled 2017-04-20: qty 1

## 2017-04-20 MED ORDER — BISACODYL 10 MG RE SUPP: 10.0000 mg | Freq: Every day | RECTAL | Status: DC | PRN

## 2017-04-20 MED ORDER — ONDANSETRON HCL 4 MG/2ML IJ SOLN
INTRAMUSCULAR | Status: AC
  Filled 2017-04-20: qty 2

## 2017-04-20 MED ORDER — WARFARIN SODIUM 7.5 MG PO TABS: 7.5000 mg | ORAL_TABLET | ORAL | Status: DC

## 2017-04-20 MED ORDER — ROCURONIUM BROMIDE 100 MG/10ML IV SOLN
INTRAVENOUS | Status: DC | PRN
  Administered 2017-04-20: 10 mg via INTRAVENOUS
  Administered 2017-04-20: 30 mg via INTRAVENOUS

## 2017-04-20 MED ORDER — MEPERIDINE HCL 50 MG/ML IJ SOLN: 6.2500 mg | INTRAMUSCULAR | Status: DC | PRN

## 2017-04-20 SURGICAL SUPPLY — 55 items
BANDAGE ESMARK 6X9 LF (GAUZE/BANDAGES/DRESSINGS) ×1 IMPLANT
BENZOIN TINCTURE PRP APPL 2/3 (GAUZE/BANDAGES/DRESSINGS) ×12 IMPLANT
BIT DRILL CALIBRATED 4.2 (BIT) ×1 IMPLANT
BIT DRILL CALIBRATED 5.0 MM (BIT) ×1 IMPLANT
BLADE SAW SGTL HD 18.5X60.5X1. (BLADE) ×3 IMPLANT
BLADE SURG 10 STRL SS (BLADE) IMPLANT
BNDG COHESIVE 4X5 TAN STRL (GAUZE/BANDAGES/DRESSINGS) ×3 IMPLANT
BNDG ESMARK 6X9 LF (GAUZE/BANDAGES/DRESSINGS) ×3
BNDG GAUZE ELAST 4 BULKY (GAUZE/BANDAGES/DRESSINGS) ×6 IMPLANT
CANISTER SUCTION 1500CC (MISCELLANEOUS) ×3 IMPLANT
CAP END T25 HF ARTHRO NAIL EX (Cap) ×1 IMPLANT
COVER MAYO STAND STRL (DRAPES) IMPLANT
COVER SURGICAL LIGHT HANDLE (MISCELLANEOUS) ×6 IMPLANT
DRAPE OEC MINIVIEW 54X84 (DRAPES) IMPLANT
DRAPE U-SHAPE 47X51 STRL (DRAPES) ×3 IMPLANT
DRESSING PREVENA PLUS CUSTOM (GAUZE/BANDAGES/DRESSINGS) ×1 IMPLANT
DRILL BIT CALIBRATED 4.2 (BIT) ×3
DRILL BIT CALIBRATED 5.0 MM (BIT) ×3
DRSG ADAPTIC 3X8 NADH LF (GAUZE/BANDAGES/DRESSINGS) ×3 IMPLANT
DRSG PREVENA PLUS CUSTOM (GAUZE/BANDAGES/DRESSINGS) ×3
DRSG VAC ATS MED SENSATRAC (GAUZE/BANDAGES/DRESSINGS) ×3 IMPLANT
DURAPREP 26ML APPLICATOR (WOUND CARE) ×3 IMPLANT
ELECT REM PT RETURN 9FT ADLT (ELECTROSURGICAL) ×3
ELECTRODE REM PT RTRN 9FT ADLT (ELECTROSURGICAL) ×1 IMPLANT
ENDCAP T25 HF ARTHRO NAIL EX (Cap) ×2 IMPLANT
GAUZE SPONGE 4X4 12PLY STRL (GAUZE/BANDAGES/DRESSINGS) ×3 IMPLANT
GLOVE BIOGEL PI IND STRL 9 (GLOVE) ×1 IMPLANT
GLOVE BIOGEL PI INDICATOR 9 (GLOVE) ×2
GLOVE SURG ORTHO 9.0 STRL STRW (GLOVE) ×3 IMPLANT
GOWN STRL REUS W/ TWL LRG LVL3 (GOWN DISPOSABLE) ×1 IMPLANT
GOWN STRL REUS W/ TWL XL LVL3 (GOWN DISPOSABLE) ×1 IMPLANT
GOWN STRL REUS W/TWL LRG LVL3 (GOWN DISPOSABLE) ×2
GOWN STRL REUS W/TWL XL LVL3 (GOWN DISPOSABLE) ×2
GUIDEWIRE 3.2X400 (WIRE) ×3 IMPLANT
KIT BASIN OR (CUSTOM PROCEDURE TRAY) ×3 IMPLANT
KIT TURNOVER KIT B (KITS) ×3 IMPLANT
NAIL HINDFOOT TI 10X150MM (Nail) ×3 IMPLANT
NS IRRIG 1000ML POUR BTL (IV SOLUTION) ×3 IMPLANT
PACK ORTHO EXTREMITY (CUSTOM PROCEDURE TRAY) ×3 IMPLANT
PAD ARMBOARD 7.5X6 YLW CONV (MISCELLANEOUS) ×6 IMPLANT
REAMER ROD DEEP FLUTE 2.5X950 (INSTRUMENTS) ×3 IMPLANT
SCREW CANN LOCK TI FT 5X30 (Screw) ×3 IMPLANT
SCREW LOCK STAR 6X76 (Screw) ×3 IMPLANT
SCREW LOCK STAR 6X85 (Screw) ×3 IMPLANT
SCREW LOCK T25 FT 36X5X4.3X (Screw) ×1 IMPLANT
SCREW LOCKING 5.0X36MM (Screw) ×2 IMPLANT
SPONGE LAP 18X18 X RAY DECT (DISPOSABLE) ×3 IMPLANT
SUCTION FRAZIER HANDLE 10FR (MISCELLANEOUS) ×2
SUCTION TUBE FRAZIER 10FR DISP (MISCELLANEOUS) ×1 IMPLANT
SUT ETHILON 2 0 PSLX (SUTURE) ×9 IMPLANT
TOWEL OR 17X24 6PK STRL BLUE (TOWEL DISPOSABLE) ×3 IMPLANT
TOWEL OR 17X26 10 PK STRL BLUE (TOWEL DISPOSABLE) ×3 IMPLANT
TUBE CONNECTING 12'X1/4 (SUCTIONS) ×1
TUBE CONNECTING 12X1/4 (SUCTIONS) ×2 IMPLANT
WATER STERILE IRR 1000ML POUR (IV SOLUTION) ×3 IMPLANT

## 2017-04-20 NOTE — Progress Notes (Signed)
ANTICOAGULATION CONSULT NOTE - Initial Consult  Pharmacy Consult for Coumadin Indication: Hx DVT, Factor V Leiden  No Known Allergies  Patient Measurements: Height: 5\' 11"  (180.3 cm) Weight: (!) 337 lb (152.9 kg) IBW/kg (Calculated) : 75.3   Vital Signs: Temp: 98.2 F (36.8 C) (03/27 1556) Temp Source: Oral (03/27 1556) BP: 120/70 (03/27 1556) Pulse Rate: 81 (03/27 1556)  Labs: Recent Labs    04/20/17 0704 04/20/17 1620  HGB 16.5  --   HCT 49.4  --   PLT 223  --   APTT 63*  --   LABPROT 26.7* 28.8*  INR 2.48 2.74  CREATININE 0.81  --     Estimated Creatinine Clearance: 154.9 mL/min (by C-G formula based on SCr of 0.81 mg/dL).   Medical History: Past Medical History:  Diagnosis Date  . Anxiety   . Depression   . DVT (deep venous thrombosis) (HCC)    right clavicle and LLE  . Factor V Leiden (HCC)   . Fractures   . Frontal lobe deficit   . Hirschsprung's disease   . Hypertension   . Impingement syndrome of left ankle   . MRSA infection    left hip   . OA (osteoarthritis)    left ankle  . Sleep apnea    wears CPAP    Medications:  PTA Coumadin dose 7.5 mg, 7.5 mg, 5 mg - then repeat  Assessment: 57 yo male on chronic Coumadin for hx DVT, Factor V Leiden admitted for tibial calcaneal fusion.  Pharmacy asked to continue Coumadin while inpatient.  Today's INR 2.74.  Last PTA Coumadin dose taken last night (7.5 mg).  Goal of Therapy:  INR 2-3 Monitor platelets by anticoagulation protocol: Yes   Plan:  1. Continue Coumadin on home schedule. 2. Daily PT/INR.  Tad MooreJessica Tywan Siever, Pharm D, BCPS  Clinical Pharmacist Pager [phone]  04/20/2017 5:26 PM

## 2017-04-20 NOTE — Anesthesia Preprocedure Evaluation (Addendum)
Anesthesia Evaluation  Patient identified by MRN, date of birth, ID band Patient awake    Reviewed: Allergy & Precautions, NPO status , Patient's Chart, lab work & pertinent test results  Airway Mallampati: II       Dental no notable dental hx. (+) Teeth Intact   Pulmonary Current Smoker,    Pulmonary exam normal breath sounds clear to auscultation       Cardiovascular hypertension, Normal cardiovascular exam Rhythm:Regular Rate:Normal     Neuro/Psych negative neurological ROS     GI/Hepatic negative GI ROS, Neg liver ROS,   Endo/Other  Morbid obesity  Renal/GU negative Renal ROS     Musculoskeletal   Abdominal (+) + obese,   Peds  Hematology Factor V Leiden   Anesthesia Other Findings   Reproductive/Obstetrics                            Anesthesia Physical Anesthesia Plan  ASA: III  Anesthesia Plan: General   Post-op Pain Management:    Induction: Intravenous  PONV Risk Score and Plan: 2 and Ondansetron, Dexamethasone and Midazolam  Airway Management Planned: Oral ETT  Additional Equipment:   Intra-op Plan:   Post-operative Plan: Extubation in OR  Informed Consent: I have reviewed the patients History and Physical, chart, labs and discussed the procedure including the risks, benefits and alternatives for the proposed anesthesia with the patient or authorized representative who has indicated his/her understanding and acceptance.   Dental advisory given  Plan Discussed with: CRNA and Surgeon  Anesthesia Plan Comments:        Anesthesia Quick Evaluation

## 2017-04-20 NOTE — H&P (Signed)
[name] is an 57 y.o. male.   Chief Complaint: Left ankle pain. HPI: Patient is a 57 year old gentleman who presents in follow-up for left ankle arthritis and subtalar arthritis.  Patient had temporary relief with left ankle arthroscopy but still has intra-articular pain he also had temporary relief with a subtalar injection which provided about 1 weeks of relief.  Of note patient states that he did underwent a osteochondral grafting for the talus of the right ankle at [facility] years ago.    Past Medical History:  Diagnosis Date  . Anxiety   . Depression   . DVT (deep venous thrombosis) (HCC)    right clavicle and LLE  . Factor V Leiden (HCC)   . Fractures   . Frontal lobe deficit   . Hirschsprung's disease   . Hypertension   . Impingement syndrome of left ankle   . MRSA infection    left hip   . OA (osteoarthritis)    left ankle  . Sleep apnea    wears CPAP    Past Surgical History:  Procedure Laterality Date  . ANKLE ARTHROSCOPY Left 07/14/2016   Procedure: LEFT ANKLE ARTHROSCOPY AND DEBRIDEMENT;  Surgeon: Nadara Mustarduda, Marcus V, MD;  [facility] OR;  Service: Orthopedics;  Laterality: Left;  . COLON SURGERY     Mega colon  . COLONOSCOPY    . [facility]IP SURGERY Left    At [facility]  . KNEE ARTHROSCOPY    . LEFT ANKLE SCOPE WITH DEBRIDMENT  Left 07/14/2016    Family History  Problem Relation Age of Onset  . Heart attack Father    Social History:  reports that he has been smoking cigarettes.  He has a 5.00 pack-year smoking history. He has never used smokeless tobacco. He reports that he drank alcohol. He reports that he has current or past drug history. Drug: Marijuana.  Allergies: No Known Allergies  Medications Prior to Admission  Medication Sig Dispense Refill  . atorvastatin (LIPITOR) 20 MG tablet Take 20 mg by mouth daily.     Marland Kitchen. FLUoxetine (PROZAC) 40 MG capsule Take 80 mg by mouth daily.     Marland Kitchen. gabapentin (NEURONTIN) 400 MG capsule Take 800-1,200 mg by mouth See admin  instructions. Take 800 mg by mouth in the morning and take 1200 mg by mouth at bedtime    . traZODone (DESYREL) 100 MG tablet Take 200 mg by mouth at bedtime.     Marland Kitchen. venlafaxine XR (EFFEXOR-XR) 150 MG 24 hr capsule Take 150 mg by mouth daily.  5  . warfarin (COUMADIN) 5 MG tablet Take 5-7.5 mg by mouth See admin instructions. Take 7.5 mg by mouth daily for 2 days, then take 5 mg by mouth daily for 1 day, then repeat    . oxyCODONE-acetaminophen (ROXICET) 5-325 MG tablet Take 1 tablet by mouth every 4 (four) hours as needed for severe pain. (Patient not taking: Reported on 04/12/2017) 60 tablet 0    No results found for this or any previous visit (from the past 48 hour(s)). No results found.  Review of Systems  All other systems reviewed and are negative.   There were no vitals taken for this visit. Physical Exam  Patient is alert, oriented, no adenopathy, well-dressed, normal affect, normal respiratory effort. Examination patient has a palpable dorsalis pedis pulse.  He has pain with attempted range of motion of the ankle he is tender to palpation anteriorly.  The sinus Tarsi is tender to palpation in the is pain with  attempted subtalar motion.  Patient walks with his left lower extremity externally rotated and basically ambulates with a stiff gait without flexion of the ankle.  There is no redness no cellulitis no signs of infection.   Assessment/Plan 1. Arthritis of left subtalar joint   2. Pain in left ankle and joints of left foot     Plan: Discussed with patient with his cystic degenerative changes of the tibiotalar joint and subtalar arthritis that his best option would be to pursue a tibial calcaneal fusion.  Discussed that he would be strict nonweightbearing for 2 months and will advance weightbearing as tolerated after that time.  Patient does live alone and would require discharge to skilled nursing to provide for care before he was safe to go home.  Risks and benefits of surgery  were discussed patient states he understands discussed that he would not have ankle range of motion or side to side foot motion on uneven terrain after surgery.     Nadara Mustard, MD 04/20/2017, 7:02 AM

## 2017-04-20 NOTE — Progress Notes (Signed)
Orthopedic Tech Progress Note Patient Details:  [name] 12/20/1960 [id number]  Ortho Devices Type of Ortho Device: CAM walker Ortho Device/Splint Location: LLE Ortho Device/Splint Interventions: Casandra DoffingOrdered       Esmirna Ravan Craig 04/20/2017, 6:22 PM

## 2017-04-20 NOTE — Anesthesia Postprocedure Evaluation (Signed)
Anesthesia Post Note  Patient: [name]  Procedure(s) Performed: LEFT TIBIOCALCANEAL FUSION (Left )     Patient location during evaluation: PACU Anesthesia Type: General Level of consciousness: awake Pain management: pain level controlled Vital Signs Assessment: post-procedure vital signs reviewed and stable Respiratory status: spontaneous breathing Cardiovascular status: stable Postop Assessment: no apparent nausea or vomiting Anesthetic complications: no    Last Vitals:  Vitals:   04/20/17 1102 04/20/17 1129  BP: (!) 161/133 (!) 115/92  Pulse: 87 94  Resp: 17 16  Temp:    SpO2: 100% 96%    Last Pain:  Vitals:   04/20/17 1102  TempSrc:   PainSc: 6    Pain Goal: Patients Stated Pain Goal: 3 (04/20/17 1102)               Zetha Kuhar JR,JOHN Susann GivensFRANKLIN

## 2017-04-20 NOTE — Transfer of Care (Signed)
Immediate Anesthesia Transfer of Care Note  Patient: [name]  Procedure(s) Performed: LEFT TIBIOCALCANEAL FUSION (Left )  Patient Location: PACU  Anesthesia Type:General  Level of Consciousness: awake, oriented and patient cooperative  Airway & Oxygen Therapy: Patient Spontanous Breathing and Patient connected to face mask oxygen  Post-op Assessment: Report given to RN and Post -op Vital signs reviewed and stable  Post vital signs: Reviewed and stable  Last Vitals:  Vitals Value Taken Time  BP 161/133 04/20/2017 11:02 AM  Temp    Pulse 86 04/20/2017 11:02 AM  Resp 21 04/20/2017 11:02 AM  SpO2 97 % 04/20/2017 11:02 AM  Vitals shown include unvalidated device data.  Last Pain:  Vitals:   04/20/17 0706  TempSrc: Oral      Patients Stated Pain Goal: 5 (04/20/17 0734)  Complications: No apparent anesthesia complications

## 2017-04-20 NOTE — Anesthesia Procedure Notes (Addendum)
Procedure Name: Intubation Date/Time: 04/20/2017 9:50 AM Performed by: Orlie Dakin, CRNA Pre-anesthesia Checklist: Patient identified, Emergency Drugs available, Suction available, Patient being monitored and Timeout performed Patient Re-evaluated:Patient Re-evaluated prior to induction Oxygen Delivery Method: Circle system utilized Preoxygenation: Pre-oxygenation with 100% oxygen Induction Type: IV induction Ventilation: Oral airway inserted - appropriate to patient size and Mask ventilation without difficulty Laryngoscope Size: Mac and 4 Grade View: Grade II Tube type: Oral Tube size: 7.5 mm Number of attempts: 1 Airway Equipment and Method: Stylet Placement Confirmation: ETT inserted through vocal cords under direct vision,  positive ETCO2 and breath sounds checked- equal and bilateral Secured at: 24 cm Tube secured with: Tape Dental Injury: Teeth and Oropharynx as per pre-operative assessment  Comments: Pre-op noted upper front 2 teeth chipped medially.  4x4s bite block used.

## 2017-04-20 NOTE — Op Note (Addendum)
04/20/2017  10:52 AM  PATIENT:  [name]    PRE-OPERATIVE DIAGNOSIS:  OSTEOARTHRITIS LEFT ANKLE AND SUBTALAR JOINT  POST-OPERATIVE DIAGNOSIS:  Same  PROCEDURE:  LEFT TIBIOCALCANEAL FUSION C-arm fluoroscopy was utilized to verify the position and alignment. Praveena wound VAC customizable.  SURGEON:  Nadara MustardMarcus V Duda, MD  PHYSICIAN ASSISTANT:None ANESTHESIA:   General  PREOPERATIVE INDICATIONS:  [name] is a  57 y.o. male with a diagnosis of OSTEOARTHRITIS LEFT ANKLE AND SUBTALAR JOINT who failed conservative measures and elected for surgical management.    The risks benefits and alternatives were discussed with the patient preoperatively including but not limited to the risks of infection, bleeding, nerve injury, cardiopulmonary complications, the need for revision surgery, among others, and the patient was willing to proceed.  OPERATIVE IMPLANTS: Synthes nail 10 x 150 mm locked proximally and distally  @ENCIMAGES @  OPERATIVE FINDINGS: Bone graft added from the fibula.  OPERATIVE PROCEDURE: Patient was brought the operating room and underwent a general anesthetic.  After adequate levels of anesthesia were obtained patient was placed in the right lateral decubitus position with the left side up the left lower extremity was prepped using DuraPrep draped in the sterile field a timeout was called.  A lateral incision was made over the fibula the distal fibula was resected.  Retractors were placed to protect the vascular bundles and a oscillating saw was used to resect the distal tibia and talar dome perpendicular to the long axis of the tibia.  Bone was removed.  The ankle was then placed at 90 degrees incision was made plantarly and a guidewire was inserted from the calcaneus into the tibia C-arm fluoroscopy verified alignment.  This was overreamed distally a guidewire was placed and this was sequentially reamed to 11 mm for the 10 mm nail.  The nail was locked distally x2  proximally x1.  C-arm fluoroscopy verified alignment.  Wounds were irrigated with normal saline bone graft was placed posteriorly for the fusion site harvested from the fibula.  Incisions were closed using 2-0 nylon.  A Praveena wound VAC was applied this had a good suction fit.  This was then overwrapped with a Covan wrap due to his venous insufficiency.  Patient was extubated taken the PACU in stable condition.   DISCHARGE PLANNING:  Antibiotic duration: 24 hours postoperatively  Weightbearing: Nonweightbearing on the left  Pain medication: High dose ordered  Dressing care/ Wound VAC: Wound VAC for 1 week  Ambulatory devices: Walker or kneeling scooter  Discharge to: Skilled nursing facility.  [facility].  Follow-up: In the office 1 week post operative.

## 2017-04-21 ENCOUNTER — Encounter (HOSPITAL_COMMUNITY): Payer: Self-pay | Admitting: Orthopedic Surgery

## 2017-04-21 LAB — PROTIME-INR
INR: 3.28
Prothrombin Time: 33.1 seconds — ABNORMAL HIGH (ref 11.4–15.2)

## 2017-04-21 NOTE — NC FL2 (Signed)
[place] MEDICAID FL2 LEVEL OF CARE SCREENING TOOL     IDENTIFICATION  Patient Name: [name] O Kugel Birthdate: 12/02/1960 Sex: male Admission Date (Current Location): 04/20/2017  [facility] [place]   Facility and Address:  [facility], [address], [place], [place] [address]      Provider Number: [id number]  Attending Physician Name and Address:  Nadara Mustarduda, Marcus V, MD  Relative Name and Phone Number:  Harlin HeysVictoria Massoud, sister, [phone]    Current Level of Care: Hospital Recommended Level of Care: Skilled Nursing Facility Prior Approval Number:    Date Approved/Denied:   PASRR Number: [id number] [id number]  Discharge Plan: SNF    Current Diagnoses: Patient Active Problem List   Diagnosis Date Noted  . S/P ankle fusion 04/20/2017  . Post-traumatic osteoarthritis, left ankle and foot   . 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 Impingement of left ankle joint 07/14/2016  . Ankle impingement syndrome, left   . History of total right knee replacement 03/09/2016  . Pain in left ankle and joints of left foot 03/09/2016    Orientation RESPIRATION BLADDER Height & Weight     Self, Time, Situation, Place  Normal Continent Weight: (!) 337 lb (152.9 kg) Height:  5\' 11"  (180.3 cm)  BEHAVIORAL SYMPTOMS/MOOD NEUROLOGICAL BOWEL NUTRITION STATUS      Continent Diet(See DC Summary)  AMBULATORY STATUS COMMUNICATION OF NEEDS Skin   Limited Assist Verbally Surgical wounds, Wound Vac                       Personal Care Assistance Level of Assistance  Dressing, Feeding, Bathing Bathing Assistance: Limited assistance Feeding assistance: Limited assistance Dressing Assistance: Limited assistance     Functional Limitations Info  Sight, Hearing, Speech Sight Info: Adequate Hearing Info: Adequate Speech Info: Adequate    SPECIAL CARE FACTORS FREQUENCY  PT (By licensed PT), OT (By licensed OT)     PT Frequency: 5x week OT  Frequency: 5x week            Contractures      Additional Factors Info  Code Status, Allergies Code Status Info: Full Allergies Info: No Known Allergies           Current Medications (04/21/2017):  This is the current hospital active medication list Current Facility-Administered Medications  Medication Dose Route Frequency Provider Last Rate Last Dose  . 0.9 %  sodium chloride infusion   Intravenous Continuous Nadara Mustarduda, Marcus V, MD 10 mL/hr at 04/20/17 1623    . acetaminophen (TYLENOL) tablet 325-650 mg  325-650 mg Oral Q6H PRN Nadara Mustarduda, Marcus V, MD      . atorvastatin (LIPITOR) tablet 20 mg  20 mg Oral Daily Nadara Mustarduda, Marcus V, MD   20 mg at 04/21/17 1155  . bisacodyl (DULCOLAX) suppository 10 mg  10 mg Rectal Daily PRN Nadara Mustarduda, Marcus V, MD      . docusate sodium (COLACE) capsule 100 mg  100 mg Oral BID Nadara Mustarduda, Marcus V, MD   100 mg at 04/21/17 1154  . FLUoxetine (PROZAC) capsule 80 mg  80 mg Oral Daily Nadara Mustarduda, Marcus V, MD   80 mg at 04/21/17 1154  . gabapentin (NEURONTIN) capsule 1,200 mg  1,200 mg Oral QHS Nadara Mustarduda, Marcus V, MD   1,200 mg at 04/20/17 2129  . gabapentin (NEURONTIN) capsule 800 mg  800 mg Oral Daily Nadara Mustarduda, Marcus V, MD   800 mg at 04/21/17 1155  . HYDROmorphone (DILAUDID)  injection 0.5-1 mg  0.5-1 mg Intravenous Q4H PRN Nadara Mustard, MD   1 mg at 04/21/17 0847  . magnesium citrate solution 1 Bottle  1 Bottle Oral Once PRN Nadara Mustard, MD      . methocarbamol (ROBAXIN) tablet 500 mg  500 mg Oral Q6H PRN Nadara Mustard, MD   500 mg at 04/21/17 1153   Or  . methocarbamol (ROBAXIN) 500 mg in dextrose 5 % 50 mL IVPB  500 mg Intravenous Q6H PRN Nadara Mustard, MD      . metoCLOPramide (REGLAN) tablet 5-10 mg  5-10 mg Oral Q8H PRN Nadara Mustard, MD       Or  . metoCLOPramide (REGLAN) injection 5-10 mg  5-10 mg Intravenous Q8H PRN Nadara Mustard, MD      . ondansetron [facility]) tablet 4 mg  4 mg Oral Q6H PRN Nadara Mustard, MD       Or  . ondansetron [facility]) injection 4 mg  4 mg  Intravenous Q6H PRN Nadara Mustard, MD      . oxyCODONE (Oxy IR/ROXICODONE) immediate release tablet 10-15 mg  10-15 mg Oral Q4H PRN Nadara Mustard, MD   15 mg at 04/21/17 1153  . oxyCODONE (Oxy IR/ROXICODONE) immediate release tablet 5-10 mg  5-10 mg Oral Q4H PRN Nadara Mustard, MD      . polyethylene glycol (MIRALAX / GLYCOLAX) packet 17 g  17 g Oral Daily PRN Nadara Mustard, MD      . traZODone (DESYREL) tablet 200 mg  200 mg Oral QHS Nadara Mustard, MD   200 mg at 04/20/17 2130  . venlafaxine XR (EFFEXOR-XR) 24 hr capsule 150 mg  150 mg Oral Daily Nadara Mustard, MD   150 mg at 04/21/17 1154  . Warfarin - Pharmacist Dosing Inpatient   Does not apply q1800 Gardner Candle, [facility]   Stopped at 04/21/17 1800     Discharge Medications: Please see discharge summary for a list of discharge medications.  Relevant Imaging Results:  Relevant Lab Results:   Additional Information SS#: [id number]  Tresa Moore, LCSW

## 2017-04-21 NOTE — Social Work (Addendum)
CSW [facility].  CSW advised patient of same.  CSW contacted [employer] to initiate Insurance Authorization and left a message on the nurse's line.  CSW will continue to follow up.  Keene BreathPatricia Briseida Gittings, LCSW Clinical Social Worker [phone]

## 2017-04-21 NOTE — Clinical Social Work Note (Signed)
Clinical Social Work Assessment  Patient Details  Name: [name] MRN: [id number] Date of Birth: 07-Nov-1960  Date of referral:  04/21/17               Reason for consult:  Facility Placement                Permission sought to share information with:  Facility Art therapist granted to share information::  Yes, Verbal Permission Granted  Name::        Agency::  SNF  Relationship::     Contact Information:     Housing/Transportation Living arrangements for the past 2 months:  Apartment Source of Information:  Patient Patient Interpreter Needed:  None Criminal Activity/Legal Involvement Pertinent to Current Situation/Hospitalization:  No - Comment as needed Significant Relationships:  Other Family Members Lives with:  Roommate Do you feel safe going back to the place where you live?  No Need for family participation in patient care:  No (Coment)  Care giving concerns:  Pt with new impairment and clinical team recommending SNF at discharge.  Social Worker assessment / plan:  CSW met with patient at bedside to discuss SNF recommendation. Pt indicated that he has roommate, but the roommate cannot assist given new impairment. Pt has been to SNF in the past and desires to go to [place]. CSW explained the SNF process and placement. CSW discussed insurance process. Pt voiced understanding. CSW will send referral to SNF's in [place]. CSW will f/u for disposition.  Employment status:  Unemployed Forensic scientist:  Other (Comment Required)([employer]) PT Recommendations:  [place] / Referral to community resources:  [place]  Patient/Family's Response to care:  Pt agreeable to SNF and thanked CSW for meeting to discuss discharge plan.  Patient/Family's Understanding of and Emotional Response to Diagnosis, Current Treatment, and Prognosis:  Pt acknowledges that he will needs NF at  discharge and prefers to return [facility] for [facility]. Pt will then return home to independence once he is stable. CSW will f/u for SNF placement and insurance auth. No issues or concerns identified. CSW will continue to follow.  Emotional Assessment Appearance:  Appears stated age Attitude/Demeanor/Rapport:  (Cooperative) Affect (typically observed):  Accepting, Appropriate Orientation:  Oriented to Situation, Oriented to  Time, Oriented to Place, Oriented to Self Alcohol / Substance use:  Not Applicable Psych involvement (Current and /or in the community):  No (Comment)  Discharge Needs  Concerns to be addressed:  Discharge Planning Concerns Readmission within the last 30 days:  No Current discharge risk:  Dependent with Mobility, Physical Impairment Barriers to Discharge:  No Barriers Identified   Normajean Baxter, LCSW 04/21/2017, 3:32 PM

## 2017-04-21 NOTE — Progress Notes (Signed)
ANTICOAGULATION CONSULT NOTE - Initial Consult  Pharmacy Consult for Coumadin Indication: Hx DVT, Factor V Leiden  No Known Allergies  Patient Measurements: Height: 5\' 11"  (180.3 cm) Weight: (!) 337 lb (152.9 kg) IBW/kg (Calculated) : 75.3   Vital Signs: Temp: 98.7 F (37.1 C) (03/28 0422) Temp Source: Oral (03/28 0422) BP: 103/52 (03/28 0422) Pulse Rate: 80 (03/28 0422)  Labs: Recent Labs    04/20/17 0704 04/20/17 1620 04/21/17 0510  HGB 16.5  --   --   HCT 49.4  --   --   PLT 223  --   --   APTT 63*  --   --   LABPROT 26.7* 28.8* 33.1*  INR 2.48 2.74 3.28  CREATININE 0.81  --   --     Estimated Creatinine Clearance: 154.9 mL/min (by C-G formula based on SCr of 0.81 mg/dL).   Medical History: Past Medical History:  Diagnosis Date  . Anxiety   . Depression   . DVT (deep venous thrombosis) (HCC)    right clavicle and LLE  . Factor V Leiden (HCC)   . Fractures   . Frontal lobe deficit   . Hirschsprung's disease   . Hypertension   . Impingement syndrome of left ankle   . MRSA infection    left hip   . OA (osteoarthritis)    left ankle  . Sleep apnea    wears CPAP    Medications:  PTA Coumadin dose 7.5 mg, 7.5 mg, 5 mg - then repeat  Assessment: 57 yo male on chronic Coumadin 7.5mg  x 2 days, then 5mg  x 1 day, then repeat for Factor V Leiden and hx of DVT. INR therapeutic at 2.48 on admit. INR today is elevated at 3.28. CBC stable  Goal of Therapy:  INR 2-3 Monitor platelets by anticoagulation protocol: Yes   Plan:  Hold Coumadin tonight Monitor daily INR, CBC, s/s of bleed  Enzo BiNathan Mana Haberl, PharmD, [facility] Clinical Pharmacist Pager [phone] 04/21/2017 9:48 AM

## 2017-04-21 NOTE — Progress Notes (Signed)
Patient ID: [name], male   DOB: 01/21/1961, 57 y.o.   MRN: [id number] Postoperative day 1 tibial calcaneal fusion on the left.  Patient has 400 cc of drainage in the wound VAC canister he is on Coumadin.  Patient will need discharge to skilled nursing.  Discussed the importance of patient not getting up without his fracture boot on.  Discussed that patient is at risk of hardware failure if he puts weight on his foot prior to healing.

## 2017-04-21 NOTE — Evaluation (Signed)
Physical Therapy Evaluation Patient Details Name: [name] MRN: [id number] DOB: 01-18-1961 Today's Date: 04/21/2017   History of Present Illness  57 y.o. male s/p L tibial calcaneal fusion 3/27. PMH includes: front lobe deficit, factor V Leiden, VT, Hirschsprung's disease, HTN,      Clinical Impression  Patient is s/p above surgery resulting in functional limitations due to the deficits listed below (see PT Problem List). PTA, pt independent with mobility living at home on disability. Upon eval pt presents with post op pain and weakness and mobility limited by NWB status. Able to ambulate into hallway with RW and min guard with good adherence to weight bearing status, fatiguing after short distances. Patient will benefit from skilled PT to increase their independence and safety with mobility to allow discharge to the venue listed below.       Follow Up Recommendations Follow surgeon's recommendation for DC plan and follow-up therapies;SNF;Supervision/Assistance - 24 hour    Equipment Recommendations  Rolling walker with 5" wheels([place])    Recommendations for Other Services       Precautions / Restrictions Precautions Precautions: Fall Required Braces or Orthoses: (CAM boot ) Restrictions Weight Bearing Restrictions: Yes LLE Weight Bearing: Non weight bearing      Mobility  Bed Mobility Overal bed mobility: Needs Assistance Bed Mobility: Supine to Sit     Supine to sit: Supervision        Transfers Overall transfer level: Needs assistance Equipment used: Rolling walker (2 wheeled) Transfers: Sit to/from Stand Sit to Stand: Min guard            Ambulation/Gait Ambulation/Gait assistance: Min guard;Supervision Ambulation Distance (Feet): 50 Feet Assistive device: Rolling walker (2 wheeled) Gait Pattern/deviations: Step-to pattern Gait velocity: decreased   General Gait Details: hop to pattern with good adherence of NWB status in RW.   Stairs             Wheelchair Mobility    Modified Rankin (Stroke Patients Only)       Balance Overall balance assessment: Needs assistance   Sitting balance-Leahy Scale: Good       Standing balance-Leahy Scale: Poor Standing balance comment: Reliant on RW for standing balance due to NWB status                             Pertinent Vitals/Pain Pain Assessment: Faces Faces Pain Scale: Hurts even more Pain Location: L ankle Pain Descriptors / Indicators: Discomfort;Grimacing;Operative site guarding Pain Intervention(s): Limited activity within patient's tolerance;Monitored during session;Premedicated before session;Repositioned    Home Living Family/patient expects to be discharged to:: Skilled nursing facility Living Arrangements: Non-relatives/Friends                    Prior Function Level of Independence: Independent         Comments: community ambulator     Higher education careers adviser        Extremity/Trunk Assessment   Upper Extremity Assessment Upper Extremity Assessment: Overall WFL for tasks assessed    Lower Extremity Assessment Lower Extremity Assessment: Overall WFL for tasks assessed(ankle NT)    Cervical / Trunk Assessment Cervical / Trunk Assessment: Normal  Communication   Communication: No difficulties  Cognition Arousal/Alertness: Awake/alert Behavior During Therapy: WFL for tasks assessed/performed;Impulsive Overall Cognitive Status: Within Functional Limits for tasks assessed  General Comments      Exercises     Assessment/Plan    PT Assessment Patient needs continued PT services  PT Problem List Decreased strength;Decreased range of motion;Obesity;Pain;Decreased activity tolerance;Decreased balance;Decreased mobility       PT Treatment Interventions DME instruction;Stair training;Gait training;Functional mobility training;Therapeutic activities;Therapeutic exercise    PT  Goals (Current goals can be found in the Care Plan section)  Acute Rehab PT Goals Patient Stated Goal: go to rehab PT Goal Formulation: With patient Time For Goal Achievement: 04/28/17 Potential to Achieve Goals: Good    Frequency Min 3X/week   Barriers to discharge Decreased caregiver support      Co-evaluation               AM-PAC PT "6 Clicks" Daily Activity  Outcome Measure Difficulty turning over in bed (including adjusting bedclothes, sheets and blankets)?: A Little Difficulty moving from lying on back to sitting on the side of the bed? : A Little Difficulty sitting down on and standing up from a chair with arms (e.g., wheelchair, bedside commode, etc,.)?: A Little Help needed moving to and from a bed to chair (including a wheelchair)?: A Little Help needed walking in hospital room?: A Little Help needed climbing 3-5 steps with a railing? : A Lot 6 Click Score: 17    End of Session Equipment Utilized During Treatment: (CAM boot) Activity Tolerance: Patient tolerated treatment well Patient left: in bed;with call bell/phone within reach Nurse Communication: Mobility status PT Visit Diagnosis: Unsteadiness on feet (R26.81);Pain;Difficulty in walking, not elsewhere classified (R26.2) Pain - Right/Left: Left Pain - part of body: Ankle and joints of foot    Time: 1040-1105 PT Time Calculation (min) (ACUTE ONLY): 25 min   Charges:   PT Evaluation $PT Eval Low Complexity: 1 Low PT Treatments $Gait Training: 8-22 mins   PT G Codes:        Etta GrandchildSean Makennah Omura, PT, DPT Acute Rehab Services Pager: [phone]    Etta GrandchildSean  Rhian Asebedo 04/21/2017, 11:25 AM

## 2017-04-21 NOTE — Discharge Instructions (Signed)

## 2017-04-22 DIAGNOSIS — S81839A Puncture wound without foreign body, unspecified lower leg, initial encounter: Secondary | ICD-10-CM | POA: Diagnosis not present

## 2017-04-22 DIAGNOSIS — G4700 Insomnia, unspecified: Secondary | ICD-10-CM | POA: Diagnosis not present

## 2017-04-22 DIAGNOSIS — M25572 Pain in left ankle and joints of left foot: Secondary | ICD-10-CM | POA: Diagnosis not present

## 2017-04-22 DIAGNOSIS — G8911 Acute pain due to trauma: Secondary | ICD-10-CM | POA: Diagnosis not present

## 2017-04-22 DIAGNOSIS — G629 Polyneuropathy, unspecified: Secondary | ICD-10-CM | POA: Diagnosis not present

## 2017-04-22 DIAGNOSIS — E559 Vitamin D deficiency, unspecified: Secondary | ICD-10-CM | POA: Diagnosis not present

## 2017-04-22 DIAGNOSIS — D6851 Activated protein C resistance: Secondary | ICD-10-CM | POA: Diagnosis not present

## 2017-04-22 DIAGNOSIS — M19072 Primary osteoarthritis, left ankle and foot: Secondary | ICD-10-CM | POA: Diagnosis not present

## 2017-04-22 LAB — PROTIME-INR
INR: 2.74
Prothrombin Time: 28.8 seconds — ABNORMAL HIGH (ref 11.4–15.2)

## 2017-04-22 MED ORDER — OXYCODONE-ACETAMINOPHEN 10-325 MG PO TABS: 1.0000 | ORAL_TABLET | ORAL | 0 refills | Status: DC | PRN

## 2017-04-22 MED ORDER — WARFARIN SODIUM 7.5 MG PO TABS: 7.5000 mg | ORAL_TABLET | Freq: Once | ORAL | Status: DC

## 2017-04-22 NOTE — Progress Notes (Signed)
ANTICOAGULATION CONSULT NOTE - Initial Consult  Pharmacy Consult for Coumadin Indication: Hx DVT, Factor V Leiden  No Known Allergies  Patient Measurements: Height: 5\' 11"  (180.3 cm) Weight: (!) 337 lb (152.9 kg) IBW/kg (Calculated) : 75.3   Vital Signs: Temp: 98 F (36.7 C) (03/29 0422) Temp Source: Oral (03/29 0422) BP: 122/78 (03/29 0422) Pulse Rate: 82 (03/29 0422)  Labs: Recent Labs    04/20/17 0704 04/20/17 1620 04/21/17 0510 04/22/17 0543  HGB 16.5  --   --   --   HCT 49.4  --   --   --   PLT 223  --   --   --   APTT 63*  --   --   --   LABPROT 26.7* 28.8* 33.1* 28.8*  INR 2.48 2.74 3.28 2.74  CREATININE 0.81  --   --   --     Estimated Creatinine Clearance: 154.9 mL/min (by C-G formula based on SCr of 0.81 mg/dL).   Medical History: Past Medical History:  Diagnosis Date  . Anxiety   . Depression   . DVT (deep venous thrombosis) (HCC)    right clavicle and LLE  . Factor V Leiden (HCC)   . Fractures   . Frontal lobe deficit   . Hirschsprung's disease   . Hypertension   . Impingement syndrome of left ankle   . MRSA infection    left hip   . OA (osteoarthritis)    left ankle  . Sleep apnea    wears CPAP    Medications:  PTA Coumadin dose 7.5 mg, 7.5 mg, 5 mg - then repeat  Assessment: 57 yo male on Coumadin 7.5mg  x 2 days, then 5mg  x 1 day, then repeat. INR therapeutic at 2.48 on admit. INR today is back down to 2.74. CBC stable  Goal of Therapy:  INR 2-3 Monitor platelets by anticoagulation protocol: Yes   Plan:  Plan for discharge today  If still here tonight, give Coumadin 7.5mg  PO x 1 Monitor daily INR, CBC, s/s of bleed   Enzo BiNathan Ziaire Bieser, PharmD, [facility] Clinical Pharmacist Pager [phone] 04/22/2017 9:19 AM

## 2017-04-22 NOTE — Discharge Summary (Signed)
Discharge Diagnoses:  Active Problems:   Post-traumatic osteoarthritis, left ankle and foot   S/P ankle fusion   Surgeries: Procedure(s): LEFT TIBIOCALCANEAL FUSION on 04/20/2017    Consultants:   Discharged Condition: Improved  Hospital Course: [name] is an 57 y.o. male who was admitted 04/20/2017 with a chief complaint of traumatic arthritis left ankle and subtalar joint, with a final diagnosis of OSTEOARTHRITIS LEFT ANKLE AND SUBTALAR JOINT.  Patient was brought to the operating room on 04/20/2017 and underwent Procedure(s): LEFT TIBIOCALCANEAL FUSION.    Patient was given perioperative antibiotics:  Anti-infectives (From admission, onward)   Start     Dose/Rate Route Frequency Ordered Stop   04/20/17 0730  ceFAZolin (ANCEF) 3 g in dextrose 5 % 50 mL IVPB     3 g 130 mL/hr over 30 Minutes [facility]ous [facility] 04/19/17 2206 04/20/17 1006    .  Patient was given sequential compression devices, early ambulation, and aspirin for DVT prophylaxis.  Recent vital signs:  Patient Vitals for the past 24 hrs:  BP Temp Temp src Pulse Resp SpO2  04/22/17 0422 122/78 98 F (36.7 C) Oral 82 18 98 %  04/21/17 2100 111/75 98.7 F (37.1 C) Oral 91 18 99 %  04/21/17 1502 133/71 99.5 F (37.5 C) Oral 85 18 96 %  .  Recent laboratory studies: No results found.  Discharge Medications:   Allergies as of 04/22/2017   No Known Allergies     Medication List    STOP taking these medications   oxyCODONE-acetaminophen 5-325 MG tablet Commonly known as:  ROXICET Replaced by:  oxyCODONE-acetaminophen 10-325 MG tablet     TAKE these medications   atorvastatin 20 MG tablet Commonly known as:  LIPITOR Take 20 mg by mouth daily.   FLUoxetine 40 MG capsule Commonly known as:  PROZAC Take 80 mg by mouth daily.   gabapentin 400 MG capsule Commonly known as:  NEURONTIN Take 800-1,200 mg by mouth See admin instructions. Take 800 mg by mouth in the morning and take 1200  mg by mouth at bedtime   oxyCODONE-acetaminophen 10-325 MG tablet Commonly known as:  PERCOCET Take 1 tablet by mouth every 4 (four) hours as needed for pain. Replaces:  oxyCODONE-acetaminophen 5-325 MG tablet   traZODone 100 MG tablet Commonly known as:  DESYREL Take 200 mg by mouth at bedtime.   venlafaxine XR 150 MG 24 hr capsule Commonly known as:  EFFEXOR-XR Take 150 mg by mouth daily.   warfarin 5 MG tablet Commonly known as:  COUMADIN Take 5-7.5 mg by mouth See admin instructions. Take 7.5 mg by mouth daily for 2 days, then take 5 mg by mouth daily for 1 day, then repeat            Discharge Care Instructions  (From admission, onward)        Start     Ordered   04/22/17 0000  Non weight bearing    Question Answer Comment  Laterality left   Extremity Lower      04/22/17 0722      Diagnostic Studies: No results found.  Patient benefited maximally from their hospital stay and there were no complications.     Disposition: Discharge disposition: 03-Skilled Nursing Facility      Discharge Instructions    Apply cam walker   Complete by:  As directed    Laterality:  Left   Call MD / Call 911   Complete by:  As directed  If you experience chest pain or shortness of breath, CALL 911 and be transported to the hospital emergency room.  If you develope a fever above 101 F, pus (white drainage) or increased drainage or redness at the wound, or calf pain, call your surgeon's office.   Constipation Prevention   Complete by:  As directed    Drink plenty of fluids.  Prune juice may be helpful.  You may use a stool softener, such as Colace (over the counter) 100 mg twice a day.  Use MiraLax (over the counter) for constipation as needed.   Diet - low sodium heart healthy   Complete by:  As directed    Increase activity slowly as tolerated   Complete by:  As directed    Negative Pressure Wound Therapy - Incisional   Complete by:  As directed    Apply Prevena vac  from OR, wear for 1 week, plug in to ensure vac charged   Non weight bearing   Complete by:  As directed    Laterality:  left   Extremity:  Lower     Follow-up Information    Nadara Mustard, MD In 1 week.   Specialty:  Orthopedic Surgery Contact information: [address] [place] [address] [phone]            Signed: Nadara Mustard 04/22/2017, 7:23 AM

## 2017-04-22 NOTE — Social Work (Signed)
CSW called [facility] on Insurance Auth for SNF placement. They are reviewing clinicals and will f/u with CSW once reviewed.  CSW will continue to follow up.  Analeya Luallen PenKeene Breathcil, LCSW Clinical Social Worker [phone]

## 2017-04-22 NOTE — Progress Notes (Signed)
Called and reported to Vernona RiegerLaura, Charity fundraiserN at [facility].  Switched patient over to prevena wound vac, fully charged.

## 2017-04-22 NOTE — Progress Notes (Signed)
[name] to be D/C'd Skilled nursing facility per MD order.  Discussed prescriptions and follow up appointments with the patient. Prescriptions given to patient, medication list explained in detail. Pt verbalized understanding.  Allergies as of 04/22/2017   No Known Allergies     Medication List    STOP taking these medications   oxyCODONE-acetaminophen 5-325 MG tablet Commonly known as:  ROXICET Replaced by:  oxyCODONE-acetaminophen 10-325 MG tablet     TAKE these medications   atorvastatin 20 MG tablet Commonly known as:  LIPITOR Take 20 mg by mouth daily.   FLUoxetine 40 MG capsule Commonly known as:  PROZAC Take 80 mg by mouth daily.   gabapentin 400 MG capsule Commonly known as:  NEURONTIN Take 800-1,200 mg by mouth See admin instructions. Take 800 mg by mouth in the morning and take 1200 mg by mouth at bedtime   oxyCODONE-acetaminophen 10-325 MG tablet Commonly known as:  PERCOCET Take 1 tablet by mouth every 4 (four) hours as needed for pain. Replaces:  oxyCODONE-acetaminophen 5-325 MG tablet   traZODone 100 MG tablet Commonly known as:  DESYREL Take 200 mg by mouth at bedtime.   venlafaxine XR 150 MG 24 hr capsule Commonly known as:  EFFEXOR-XR Take 150 mg by mouth daily.   warfarin 5 MG tablet Commonly known as:  COUMADIN Take 5-7.5 mg by mouth See admin instructions. Take 7.5 mg by mouth daily for 2 days, then take 5 mg by mouth daily for 1 day, then repeat            Discharge Care Instructions  (From admission, onward)        Start     Ordered   04/22/17 0000  Non weight bearing    Question Answer Comment  Laterality left   Extremity Lower      04/22/17 0722      Vitals:   04/21/17 2100 04/22/17 0422  BP: 111/75 122/78  Pulse: 91 82  Resp: 18 18  Temp: 98.7 F (37.1 C) 98 F (36.7 C)  SpO2: 99% 98%    Skin clean, dry and intact without evidence of skin break down, no evidence of skin tears noted.  Left lower leg wrapped in  ACE wrap, with prevena wound vac attached and fully charged. Report called to Vern[facility]gerLaura, [facility]. IV catheter discontinued intact. Site without signs and symptoms of complications. Dressing and pressure applied. Pt denies pain at this time. No complaints noted.  An After Visit Summary and prescriptions were printed and given to the PTAR. Patient escorted via stretcher, and D/C to SNF via [facility].  GrenadaBrittany Mechele Kittleson RN

## 2017-04-22 NOTE — Social Work (Signed)
[facility] provided Auth#[id number] for SNF placement at [facility].  CSW will f/u for disposition.  Keene BreathPatricia Andras Grunewald, LCSW Clinical Social Worker [phone]

## 2017-04-22 NOTE — Clinical Social Work Placement (Signed)
   CLINICAL SOCIAL WORK PLACEMENT  NOTE  Date:  04/22/2017  Patient Details  Name: [name] MRN: [id number] Date of Birth: 07/11/1960  Clinical Social Work is seeking post-discharge placement for this patient at the Skilled  Nursing Facility level of care (*CSW will initial, date and re-position this form in  chart as items are completed):  Yes   Patient/family provided with [facility] Clinical Social Work Department's list of facilities offering this level of care within the geographic area requested by the patient (or if unable, by the patient's family).  Yes   Patient/family informed of their freedom to choose among providers that offer the needed level of care, that participate in Medicare, Medicaid or managed care program needed by the patient, have an available bed and are willing to accept the patient.  Yes   Patient/family informed of [facility]'s ownership interest in [facility] and [facility], as well as of the fact that they are under no obligation to receive care at these facilities.  PASRR submitted to EDS on       PASRR number received on       Existing PASRR number confirmed on 04/21/17     FL2 transmitted to all facilities in geographic area requested by pt/family on       FL2 transmitted to all facilities within larger geographic area on 04/21/17     Patient informed that his/her managed care company has contracts with or will negotiate with certain facilities, including the following:        Yes   Patient/family informed of bed offers received.  [facility]     Physician recommends and patient chooses bed at      Patient to be [facility] on 04/22/[facility].  Patient to be transferred to facility by [facility]     Patient family notified on 04/22/17 of transfer.  Name of family member notified:  pt responsible for self     PHYSICIAN       Additional Comment:     _______________________________________________ Tresa MoorePatricia V Kristian Mogg, LCSW 04/22/2017, 1:26 PM

## 2017-04-22 NOTE — Social Work (Signed)
Clinical Social Worker facilitated patient discharge including contacting patient family and facility to confirm patient discharge plans.  Clinical information faxed to facility and family agreeable with plan.    CSW arranged ambulance transport via [facility] to [facility].    RN to call [phone] to give report prior to discharge.  Clinical Social Worker will sign off for now as social work intervention is no longer needed. Please consult us again if new need arises.  Keene BreathPatricia Griselle Rufer, LCSW Clinical Social Worker [phone]

## 2017-04-23 DIAGNOSIS — M19072 Primary osteoarthritis, left ankle and foot: Secondary | ICD-10-CM | POA: Diagnosis not present

## 2017-04-23 DIAGNOSIS — G629 Polyneuropathy, unspecified: Secondary | ICD-10-CM | POA: Diagnosis not present

## 2017-04-23 DIAGNOSIS — D6851 Activated protein C resistance: Secondary | ICD-10-CM | POA: Diagnosis not present

## 2017-04-23 DIAGNOSIS — G4700 Insomnia, unspecified: Secondary | ICD-10-CM | POA: Diagnosis not present

## 2017-04-25 ENCOUNTER — Telehealth (INDEPENDENT_AMBULATORY_CARE_PROVIDER_SITE_OTHER): Payer: Self-pay

## 2017-04-25 DIAGNOSIS — D6851 Activated protein C resistance: Secondary | ICD-10-CM | POA: Diagnosis not present

## 2017-04-25 DIAGNOSIS — G4700 Insomnia, unspecified: Secondary | ICD-10-CM | POA: Diagnosis not present

## 2017-04-25 DIAGNOSIS — G629 Polyneuropathy, unspecified: Secondary | ICD-10-CM | POA: Diagnosis not present

## 2017-04-25 DIAGNOSIS — M19072 Primary osteoarthritis, left ankle and foot: Secondary | ICD-10-CM | POA: Diagnosis not present

## 2017-04-25 NOTE — Telephone Encounter (Signed)
Nurse is needing wound vac instructions for this patient. Can they take off? Is he to where until rov on 04/27/17? The one he is wearing keeps beeping and want to know if they can apply one of theirs?

## 2017-04-25 NOTE — Telephone Encounter (Signed)
I called and sw nurse Tamedra and she advised that the vac did beep a few times but it is not now. She states that the canister is only half way full and they will make sure that the pt has it charged. He has an appt to come in on Wednesday for nurse only visit and that instructions for daily wound care will be given at that time. She state she will call with any questions or concerns otherwise will see pt on Wednesday.

## 2017-04-27 ENCOUNTER — Ambulatory Visit (INDEPENDENT_AMBULATORY_CARE_PROVIDER_SITE_OTHER): Payer: PPO

## 2017-04-27 ENCOUNTER — Encounter (INDEPENDENT_AMBULATORY_CARE_PROVIDER_SITE_OTHER): Payer: Self-pay

## 2017-04-27 VITALS — Ht 71.0 in | Wt 337.0 lb

## 2017-04-27 DIAGNOSIS — M19172 Post-traumatic osteoarthritis, left ankle and foot: Secondary | ICD-10-CM

## 2017-04-27 NOTE — Progress Notes (Signed)
Patient is here today 1 week s/p a left tibial calcaneal fusion with prevena vac placement 04/20/17. He is non weight bearing in a wheelchair. The vac is removed and there is a strong odor due to the small amount of bloody drainage that is trapped under the vac. The incision is well approximated and stitches are intact. The pt states that he was given a fracture boot when he was in the hospital and he has not been able to wear this due to the swelling in his ankle.  ABD and ace bandage was applied wrapping from the toes to below the pt's knee.  We did try to apply the fracture boot and the pt states that he is not able to tolerate this because it is pushing too hard on his incision. I advised the pt to continue with elevation and non weight bearing. [facility]. He states that he has " passed all the test with flying colors" and thinks that he be discharged this week. Orders written for the pt to have the area washed with dial soap and water daily and dry dressing application daily. Continue with elevation of his foot higher than his heart. Advised to call with any changes otherwise pt will follow up with Dr. Lajoyce Cornersuda in the office next week.    Suann LarryAutumn Shaya Reddick, RMA, [employer]WA

## 2017-04-29 DIAGNOSIS — E559 Vitamin D deficiency, unspecified: Secondary | ICD-10-CM | POA: Diagnosis not present

## 2017-05-02 DIAGNOSIS — D6851 Activated protein C resistance: Secondary | ICD-10-CM | POA: Diagnosis not present

## 2017-05-03 ENCOUNTER — Ambulatory Visit (INDEPENDENT_AMBULATORY_CARE_PROVIDER_SITE_OTHER): Payer: PPO | Admitting: Orthopedic Surgery

## 2017-05-04 DIAGNOSIS — G629 Polyneuropathy, unspecified: Secondary | ICD-10-CM | POA: Diagnosis not present

## 2017-05-04 DIAGNOSIS — D6851 Activated protein C resistance: Secondary | ICD-10-CM | POA: Diagnosis not present

## 2017-05-04 DIAGNOSIS — M19072 Primary osteoarthritis, left ankle and foot: Secondary | ICD-10-CM | POA: Diagnosis not present

## 2017-05-05 ENCOUNTER — Ambulatory Visit (INDEPENDENT_AMBULATORY_CARE_PROVIDER_SITE_OTHER): Payer: PPO

## 2017-05-05 ENCOUNTER — Ambulatory Visit (INDEPENDENT_AMBULATORY_CARE_PROVIDER_SITE_OTHER): Payer: PPO | Admitting: Orthopedic Surgery

## 2017-05-05 ENCOUNTER — Encounter (INDEPENDENT_AMBULATORY_CARE_PROVIDER_SITE_OTHER): Payer: Self-pay | Admitting: Orthopedic Surgery

## 2017-05-05 VITALS — Ht 71.0 in | Wt 337.0 lb

## 2017-05-05 DIAGNOSIS — M19172 Post-traumatic osteoarthritis, left ankle and foot: Secondary | ICD-10-CM

## 2017-05-05 DIAGNOSIS — M25572 Pain in left ankle and joints of left foot: Secondary | ICD-10-CM

## 2017-05-05 MED ORDER — OXYCODONE-ACETAMINOPHEN 10-325 MG PO TABS: 1.0000 | ORAL_TABLET | ORAL | 0 refills | Status: DC | PRN

## 2017-05-05 NOTE — Progress Notes (Signed)
Office Visit Note   Patient: [name]           Date of Birth: 08/07/1960           MRN: [id number] Visit Date: 05/05/2017              Requested by: Simone CuriaLee, Keung, MD [address] [place], [place] [id number] PCP: Simone CuriaLee, Keung, MD  Chief Complaint  Patient presents with  . Left Ankle - Routine Post Op    04/20/17 left tib-cal fusion       HPI: Patient is a 57 year old gentleman status post tibial calcaneal fusion he [facility].  Patient states that he will be discharged soon.  Assessment & Plan: Visit Diagnoses:  1. Pain in left ankle and joints of left foot   2. Post-traumatic osteoarthritis, left ankle and foot     Plan: The sutures are harvested patient is given a new fracture boot he is not using 1 of the when he has is too small.  He may be touchdown weightbearing on the left.  Prescription provided for Percocet for his discharge from skilled nursing.  Follow-Up Instructions: Return in about 2 weeks (around 05/19/2017).   Ortho Exam  Patient is alert, oriented, no adenopathy, well-dressed, normal affect, normal respiratory effort. Examination patient ankle is at 90 degrees the incisions are healing nicely there is no cellulitis no odor no drainage no signs of infection.  Imaging: Xr Ankle Complete Left  Result Date: 05/05/2017 3 view radiographs of the left ankle shows valgus alignment to the hindfoot the hardware is stable without complicating features.  No images are attached to the encounter.  Labs: No results found for: HGBA1C, ESRSEDRATE, CRP, LABURIC, REPTSTATUS, GRAMSTAIN, CULT, LABORGA  @LABSALLVALUES (HGBA1)@  Body mass index is 47 kg/m.  Orders:  Orders Placed This Encounter  Procedures  . XR Ankle Complete Left   Meds ordered this encounter  Medications  . oxyCODONE-acetaminophen (PERCOCET) 10-325 MG tablet    Sig: Take 1 tablet by mouth every 4 (four) hours as needed for pain.    Dispense:  42 tablet    Refill:  0     Procedures: No procedures performed  Clinical Data: No additional findings.  ROS:  All other systems negative, except as noted in the HPI. Review of Systems  Objective: Vital Signs: Ht 5\' 11"  (1.803 m)   Wt (!) 337 lb (152.9 kg)   BMI 47.00 kg/m   Specialty Comments:  No specialty comments available.  PMFS History: Patient Active Problem List   Diagnosis Date Noted  . S/P ankle fusion 04/20/2017  . Post-traumatic osteoarthritis, left ankle and foot   . 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 Impingement of left ankle joint 07/14/2016  . Ankle impingement syndrome, left   . History of total right knee replacement 03/09/2016  . Pain in left ankle and joints of left foot 03/09/2016   Past Medical History:  Diagnosis Date  . Anxiety   . Depression   . DVT (deep venous thrombosis) (HCC)    right clavicle and LLE  . Factor V Leiden (HCC)   . Fractures   . Frontal lobe deficit   . Hirschsprung's disease   . Hypertension   . Impingement syndrome of left ankle   . MRSA infection    left hip   . OA (osteoarthritis)    left ankle  . Sleep apnea    wears CPAP    Family  History  Problem Relation Age of Onset  . Heart attack Father     Past Surgical History:  Procedure Laterality Date  . ANKLE ARTHROSCOPY Left 07/14/2016   Procedure: LEFT ANKLE ARTHROSCOPY AND DEBRIDEMENT;  Surgeon: Nadara Mustard, MD;  Location: [facility] OR;  Service: Orthopedics;  Laterality: Left;  . ANKLE FUSION Left 04/20/2017   Procedure: LEFT TIBIOCALCANEAL FUSION;  Surgeon: Nadara Mustard, MD;  Location[facility];  Service: Orthopedics;  Laterality: Left;  . COLON SURGERY     Mega colon  . COLONOSCOPY    . HIP SURGERY Left    At [facility]  . KNEE ARTHROSCOPY    . LEFT ANKLE SCOPE WITH DEBRIDMENT  Left 07/14/2016   Social History   Occupational History  . Not on file  Tobacco Use  . Smoking status: Current Every Day Smoker    Packs/day: 0.25    Years: 20.00    Pack years:  5.00    Types: Cigarettes  . Smokeless tobacco: Never Used  Substance and Sexual Activity  . Alcohol use: Not Currently  . Drug use: Yes    Types: Marijuana    Comment: hasn't used any in 1 mth  . Sexual activity: Not on file

## 2017-05-06 ENCOUNTER — Other Ambulatory Visit: Payer: Self-pay

## 2017-05-06 NOTE — Patient Outreach (Signed)
[facility] Care Management  05/06/2017  [name] 02/26/1960 [id number]  Subjective: "I am very independent. I think I will be ok. If I need help I will call".  RNCM [facility]: member discharged from rehabilitation facility 05/06/17; lives alone and refused Home health care.   Assessment: 57 year old post ankle fusion. history of post-traumatic osteoarthritis, left ankle, right total knee replacement, Left ankle impingement syndrome.  RNCM called regarding transition of care. Client reports no issues at this time. He states he has all his medications except pain medication and will get his neighbor to pick the medication up from his pharmacy. He states follow up appointment with his primary care is scheduled for next week April 18 th and follow up with orthopedic physician is 05/24/17. Last visit to orthopedic surgeon was on yesterday.   Discussed home health. Client states, "I don't need any of that". And reports he has the equipment he needs.  RNCM discussed [facility] care [facility] program. Client declines community care management; client declines telephonic case management stating, "If I need help I will call".   RNCM provided contact name and number. RNCM provided 24 hour nurse advice line.  Plan: RNCM will not open case. RNCM will [facility].  Kathyrn SheriffJuana Arleatha Philipps, RN, MSN, [facility],CCM [facility] Community Care Coordinator Cell: [phone]

## 2017-05-12 DIAGNOSIS — E7800 Pure hypercholesterolemia, unspecified: Secondary | ICD-10-CM | POA: Diagnosis not present

## 2017-05-12 DIAGNOSIS — E291 Testicular hypofunction: Secondary | ICD-10-CM | POA: Diagnosis not present

## 2017-05-12 DIAGNOSIS — F419 Anxiety disorder, unspecified: Secondary | ICD-10-CM | POA: Diagnosis not present

## 2017-05-12 DIAGNOSIS — R791 Abnormal coagulation profile: Secondary | ICD-10-CM | POA: Diagnosis not present

## 2017-05-12 DIAGNOSIS — K219 Gastro-esophageal reflux disease without esophagitis: Secondary | ICD-10-CM | POA: Diagnosis not present

## 2017-05-12 DIAGNOSIS — M159 Polyosteoarthritis, unspecified: Secondary | ICD-10-CM | POA: Diagnosis not present

## 2017-05-12 DIAGNOSIS — D6851 Activated protein C resistance: Secondary | ICD-10-CM | POA: Diagnosis not present

## 2017-05-12 DIAGNOSIS — Z1331 Encounter for screening for depression: Secondary | ICD-10-CM | POA: Diagnosis not present

## 2017-05-12 DIAGNOSIS — I2699 Other pulmonary embolism without acute cor pulmonale: Secondary | ICD-10-CM | POA: Diagnosis not present

## 2017-05-12 DIAGNOSIS — K589 Irritable bowel syndrome without diarrhea: Secondary | ICD-10-CM | POA: Diagnosis not present

## 2017-05-12 DIAGNOSIS — I10 Essential (primary) hypertension: Secondary | ICD-10-CM | POA: Diagnosis not present

## 2017-05-12 DIAGNOSIS — R609 Edema, unspecified: Secondary | ICD-10-CM | POA: Diagnosis not present

## 2017-05-18 ENCOUNTER — Encounter (INDEPENDENT_AMBULATORY_CARE_PROVIDER_SITE_OTHER): Payer: Self-pay | Admitting: Orthopedic Surgery

## 2017-05-19 DIAGNOSIS — Z7901 Long term (current) use of anticoagulants: Secondary | ICD-10-CM | POA: Diagnosis not present

## 2017-05-24 ENCOUNTER — Ambulatory Visit (INDEPENDENT_AMBULATORY_CARE_PROVIDER_SITE_OTHER): Payer: PPO | Admitting: Orthopedic Surgery

## 2017-05-31 ENCOUNTER — Ambulatory Visit (INDEPENDENT_AMBULATORY_CARE_PROVIDER_SITE_OTHER): Payer: PPO

## 2017-05-31 ENCOUNTER — Encounter (INDEPENDENT_AMBULATORY_CARE_PROVIDER_SITE_OTHER): Payer: Self-pay | Admitting: Orthopedic Surgery

## 2017-05-31 ENCOUNTER — Ambulatory Visit (INDEPENDENT_AMBULATORY_CARE_PROVIDER_SITE_OTHER): Payer: PPO | Admitting: Orthopedic Surgery

## 2017-05-31 DIAGNOSIS — M25572 Pain in left ankle and joints of left foot: Secondary | ICD-10-CM

## 2017-05-31 DIAGNOSIS — M545 Low back pain, unspecified: Secondary | ICD-10-CM
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
## 2017-06-01 ENCOUNTER — Encounter (INDEPENDENT_AMBULATORY_CARE_PROVIDER_SITE_OTHER): Payer: Self-pay | Admitting: Orthopedic Surgery

## 2017-06-17 DIAGNOSIS — Z7901 Long term (current) use of anticoagulants: Secondary | ICD-10-CM | POA: Diagnosis not present

## 2017-06-28 ENCOUNTER — Ambulatory Visit (INDEPENDENT_AMBULATORY_CARE_PROVIDER_SITE_OTHER): Payer: PPO | Admitting: Orthopedic Surgery

## 2017-06-28 ENCOUNTER — Ambulatory Visit (INDEPENDENT_AMBULATORY_CARE_PROVIDER_SITE_OTHER): Payer: PPO

## 2017-06-28 ENCOUNTER — Encounter (INDEPENDENT_AMBULATORY_CARE_PROVIDER_SITE_OTHER): Payer: Self-pay | Admitting: Orthopedic Surgery

## 2017-06-28 VITALS — Ht 71.0 in | Wt 337.0 lb

## 2017-06-28 DIAGNOSIS — M25572 Pain in left ankle and joints of left foot: Secondary | ICD-10-CM

## 2017-06-28 DIAGNOSIS — M19172 Post-traumatic osteoarthritis, left ankle and foot: Secondary | ICD-10-CM

## 2017-06-28 NOTE — Progress Notes (Signed)
Office Visit Note   Patient: [name]           Date of Birth: 03/27/1960           MRN: [id number] Visit Date: 06/28/2017              Requested by: Simone CuriaLee, Keung, MD [address] [place] [id number] PCP: Simone CuriaLee, Keung, MD  Chief Complaint  Patient presents with  . Left Ankle - Routine Post Op    04/20/17 left tibiocalcaneal fusion       HPI: Patient is a 57 year old gentleman who is 2 months status post tibial calcaneal fusion on the left.  He is currently ambulating with a walker and a fracture boot.  Patient complains of pain with weightbearing.  He uses ice for the swelling he states he has swelling despite elevation.  Assessment & Plan: Visit Diagnoses:  1. Pain in left ankle and joints of left foot   2. Post-traumatic osteoarthritis, left ankle and foot     Plan: Recommended that he back off on his weightbearing and use his kneeling scooter.  Recommended using his knee-high compression stockings around-the-clock to help decrease the swelling.  2 view radiographs of the left ankle at follow-up.  Follow-Up Instructions: Return in about 1 month (around 07/26/2017).   Ortho Exam  Patient is alert, oriented, no adenopathy, well-dressed, normal affect, normal respiratory effort. Examination patient has shortness of breath with ambulation and while at rest this is his baseline.  There is no redness no cellulitis around the ankle but patient does have venous stasis swelling.  His foot is plantigrade there are no ulcers no signs of infection.  Imaging: Xr Ankle Complete Left  Result Date: 06/28/2017 3 view radiographs of the left ankle shows stable tibial calcaneal fusion with interval healing no hardware failure.  No images are attached to the encounter.  Labs: No results found for: HGBA1C, ESRSEDRATE, CRP, LABURIC, REPTSTATUS, GRAMSTAIN, CULT, LABORGA   Lab Results  Component Value Date   ALBUMIN 3.9 04/20/2017    Body mass index is 47  kg/m.  Orders:  Orders Placed This Encounter  Procedures  . XR Ankle Complete Left   No orders of the defined types were placed in this encounter.    Procedures: No procedures performed  Clinical Data: No additional findings.  ROS:  All other systems negative, except as noted in the HPI. Review of Systems  Objective: Vital Signs: Ht 5\' 11"  (1.803 m)   Wt (!) 337 lb (152.9 kg)   BMI 47.00 kg/m   Specialty Comments:  No specialty comments available.  PMFS History: Patient Active Problem List   Diagnosis Date Noted  . S/P ankle fusion 04/20/2017  . Post-traumatic osteoarthritis, left ankle and foot   . 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 Impingement of left ankle joint 07/14/2016  . Ankle impingement syndrome, left   . History of total right knee replacement 03/09/2016  . Pain in left ankle and joints of left foot 03/09/2016   Past Medical History:  Diagnosis Date  . Anxiety   . Depression   . DVT (deep venous thrombosis) (HCC)    right clavicle and LLE  . Factor V Leiden (HCC)   . Fractures   . Frontal lobe deficit   . Hirschsprung's disease   . Hypertension   . Impingement syndrome of left ankle   . MRSA infection    left hip   . OA (osteoarthritis)  left ankle  . Sleep apnea    wears CPAP    Family History  Problem Relation Age of Onset  . Heart attack Father     Past Surgical History:  Procedure Laterality Date  . ANKLE ARTHROSCOPY Left 07/14/2016   Procedure: LEFT ANKLE ARTHROSCOPY AND DEBRIDEMENT;  Surgeon: Nadara Mustard, MD;  Location: [facility] OR;  Service: Orthopedics;  Laterality: Left;  . ANKLE FUSION Left 04/20/2017   Procedure: LEFT TIBIOCALCANEAL FUSION;  Surgeon: Nadara Mustard, MD;  Location: [facility] OR;  Service: Orthopedics;  Laterality: Left;  . COLON SURGERY     Mega colon  . COLONOSCOPY    . HIP SURGERY Left    At [facility]  . KNEE ARTHROSCOPY    . LEFT ANKLE SCOPE WITH DEBRIDMENT  Left 07/14/2016   Social History    Occupational History  . Not on file  Tobacco Use  . Smoking status: Current Every Day Smoker    Packs/day: 0.25    Years: 20.00    Pack years: 5.00    Types: Cigarettes  . Smokeless tobacco: Never Used  Substance and Sexual Activity  . Alcohol use: Not Currently  . Drug use: Yes    Types: Marijuana    Comment: hasn't used any in 1 mth  . Sexual activity: Not on file

## 2017-06-29 ENCOUNTER — Ambulatory Visit (INDEPENDENT_AMBULATORY_CARE_PROVIDER_SITE_OTHER): Payer: PPO | Admitting: Orthopedic Surgery

## 2017-07-25 ENCOUNTER — Ambulatory Visit (INDEPENDENT_AMBULATORY_CARE_PROVIDER_SITE_OTHER): Payer: Self-pay

## 2017-07-25 ENCOUNTER — Ambulatory Visit (INDEPENDENT_AMBULATORY_CARE_PROVIDER_SITE_OTHER): Payer: PPO | Admitting: Orthopedic Surgery

## 2017-07-25 ENCOUNTER — Encounter (INDEPENDENT_AMBULATORY_CARE_PROVIDER_SITE_OTHER): Payer: Self-pay | Admitting: Orthopedic Surgery

## 2017-07-25 VITALS — Ht 71.0 in | Wt 337.0 lb

## 2017-07-25 DIAGNOSIS — M25572 Pain in left ankle and joints of left foot: Secondary | ICD-10-CM | POA: Diagnosis not present

## 2017-07-25 NOTE — Progress Notes (Signed)
Office Visit Note   Patient: [name]           Date of Birth: Nov 27, 1960           MRN: [id number] Visit Date: 07/25/2017              Requested by: Simone Curia, MD [address] [place], [place] [address] PCP: Simone Curia, MD  Chief Complaint  Patient presents with  . Left Ankle - Routine Post Op    04/20/17 left tibcal fusion       HPI: Patient is a 57 year old gentleman who states that he was on his kneeling scooter and he made a turn in his home he ran over a dog biscuit fell directly on his left foot and ankle had had acute onset of pain in the left foot.  Assessment & Plan: Visit Diagnoses:  1. Pain in left ankle and joints of left foot     Plan: Continue with the fracture boot recommended he try using his custom orthotics to provide better support through the midfoot region.  At follow-up for repeat 2 view radiographs of the left ankle.  Follow-Up Instructions: Return in about 1 month (around 08/22/2017).   Ortho Exam  Patient is alert, oriented, no adenopathy, well-dressed, normal affect, normal respiratory effort. Examination patient has venous insufficiency in both lower extremities with brawny skin color changes but no open ulcers there is minimal swelling his foot is plantigrade he is tender to palpation across the midfoot region.  He has no pain with weightbearing over the heel.  Imaging: Xr Ankle 2 Views Left  Result Date: 07/25/2017 3 view radiographs of the left foot shows no evidence of fracture.  Patient does have degenerative changes across the Lisfranc complex.  There is calcification of the arteries into the foot.  Xr Foot 2 Views Left  Result Date: 07/25/2017 2 view radiographs of the left ankle shows stable tibial calcaneal fusion with no complicating features there is some mild lucency around the hardware.  No images are attached to the encounter.  Labs: No results found for: HGBA1C, ESRSEDRATE, CRP, LABURIC, REPTSTATUS, GRAMSTAIN,  CULT, LABORGA   Lab Results  Component Value Date   ALBUMIN 3.9 04/20/2017    Body mass index is 47 kg/m.  Orders:  Orders Placed This Encounter  Procedures  . XR Ankle 2 Views Left  . XR Foot 2 Views Left   No orders of the defined types were placed in this encounter.    Procedures: No procedures performed  Clinical Data: No additional findings.  ROS:  All other systems negative, except as noted in the HPI. Review of Systems  Objective: Vital Signs: Ht 5\' 11"  (1.803 m)   Wt (!) 337 lb (152.9 kg)   BMI 47.00 kg/m   Specialty Comments:  No specialty comments available.  PMFS History: Patient Active Problem List   Diagnosis Date Noted  . S/P ankle fusion 04/20/2017  . Post-traumatic osteoarthritis, left ankle and foot   . 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 Impingement of left ankle joint 07/14/2016  . Ankle impingement syndrome, left   . History of total right knee replacement 03/09/2016  . Pain in left ankle and joints of left foot 03/09/2016   Past Medical History:  Diagnosis Date  . Anxiety   . Depression   . DVT (deep venous thrombosis) (HCC)    right clavicle and LLE  . Factor V Leiden (HCC)   . Fractures   .  Frontal lobe deficit   . Hirschsprung's disease   . Hypertension   . Impingement syndrome of left ankle   . MRSA infection    left hip   . OA (osteoarthritis)    left ankle  . Sleep apnea    wears CPAP    Family History  Problem Relation Age of Onset  . Heart attack Father     Past Surgical History:  Procedure Laterality Date  . ANKLE ARTHROSCOPY Left 07/14/2016   Procedure: LEFT ANKLE ARTHROSCOPY AND DEBRIDEMENT;  Surgeon: Nadara Mustarduda, Lodema Parma V, MD;  [facility];  Service: Orthopedics;  Laterality: Left;  . ANKLE FUSION Left 04/20/2017   Procedure: LEFT TIBIOCALCANEAL FUSION;  Surgeon: Nadara Mustarduda, Kina Shiffman V, MD;  [facility];  Service: Orthopedics;  Laterality: Left;  . COLON SURGERY     Mega colon  . COLONOSCOPY    . H[facility]GERY Left    [facility]  . KNEE ARTHROSCOPY    . LEFT ANKLE SCOPE WITH DEBRIDMENT  Left 07/14/2016   Social History   Occupational History  . Not on file  Tobacco Use  . Smoking status: Current Every Day Smoker    Packs/day: 0.25    Years: 20.00    Pack years: 5.00    Types: Cigarettes  . Smokeless tobacco: Never Used  Substance and Sexual Activity  . Alcohol use: Not Currently  . Drug use: Yes    Types: Marijuana    Comment: hasn't used any in 1 mth  . Sexual activity: Not on file

## 2017-07-26 DIAGNOSIS — E291 Testicular hypofunction: Secondary | ICD-10-CM | POA: Diagnosis not present

## 2017-07-26 DIAGNOSIS — K219 Gastro-esophageal reflux disease without esophagitis: Secondary | ICD-10-CM | POA: Diagnosis not present

## 2017-07-26 DIAGNOSIS — R609 Edema, unspecified: Secondary | ICD-10-CM | POA: Diagnosis not present

## 2017-07-26 DIAGNOSIS — D6851 Activated protein C resistance: Secondary | ICD-10-CM | POA: Diagnosis not present

## 2017-07-26 DIAGNOSIS — R791 Abnormal coagulation profile: Secondary | ICD-10-CM | POA: Diagnosis not present

## 2017-07-26 DIAGNOSIS — I2699 Other pulmonary embolism without acute cor pulmonale: Secondary | ICD-10-CM | POA: Diagnosis not present

## 2017-07-26 DIAGNOSIS — N509 Disorder of male genital organs, unspecified: Secondary | ICD-10-CM | POA: Diagnosis not present

## 2017-07-26 DIAGNOSIS — M159 Polyosteoarthritis, unspecified: Secondary | ICD-10-CM | POA: Diagnosis not present

## 2017-07-26 DIAGNOSIS — E8881 Metabolic syndrome: Secondary | ICD-10-CM | POA: Diagnosis not present

## 2017-07-26 DIAGNOSIS — F419 Anxiety disorder, unspecified: Secondary | ICD-10-CM | POA: Diagnosis not present

## 2017-07-26 DIAGNOSIS — K589 Irritable bowel syndrome without diarrhea: Secondary | ICD-10-CM | POA: Diagnosis not present

## 2017-07-26 DIAGNOSIS — F17201 Nicotine dependence, unspecified, in remission: Secondary | ICD-10-CM | POA: Diagnosis not present

## 2017-08-23 ENCOUNTER — Ambulatory Visit (INDEPENDENT_AMBULATORY_CARE_PROVIDER_SITE_OTHER): Payer: PPO | Admitting: Orthopedic Surgery

## 2017-08-25 DIAGNOSIS — F17201 Nicotine dependence, unspecified, in remission: Secondary | ICD-10-CM | POA: Diagnosis not present

## 2017-08-25 DIAGNOSIS — K219 Gastro-esophageal reflux disease without esophagitis: Secondary | ICD-10-CM | POA: Diagnosis not present

## 2017-08-25 DIAGNOSIS — K589 Irritable bowel syndrome without diarrhea: Secondary | ICD-10-CM | POA: Diagnosis not present

## 2017-08-25 DIAGNOSIS — I2699 Other pulmonary embolism without acute cor pulmonale: Secondary | ICD-10-CM | POA: Diagnosis not present

## 2017-08-25 DIAGNOSIS — R609 Edema, unspecified: Secondary | ICD-10-CM | POA: Diagnosis not present

## 2017-08-25 DIAGNOSIS — E7800 Pure hypercholesterolemia, unspecified: Secondary | ICD-10-CM | POA: Diagnosis not present

## 2017-08-25 DIAGNOSIS — E8881 Metabolic syndrome: Secondary | ICD-10-CM | POA: Diagnosis not present

## 2017-08-25 DIAGNOSIS — E291 Testicular hypofunction: Secondary | ICD-10-CM | POA: Diagnosis not present

## 2017-08-25 DIAGNOSIS — Z7901 Long term (current) use of anticoagulants: Secondary | ICD-10-CM | POA: Diagnosis not present

## 2017-08-25 DIAGNOSIS — I10 Essential (primary) hypertension: Secondary | ICD-10-CM | POA: Diagnosis not present

## 2017-08-25 DIAGNOSIS — N509 Disorder of male genital organs, unspecified: Secondary | ICD-10-CM | POA: Diagnosis not present

## 2017-08-25 DIAGNOSIS — F419 Anxiety disorder, unspecified: Secondary | ICD-10-CM | POA: Diagnosis not present

## 2017-08-25 DIAGNOSIS — D6851 Activated protein C resistance: Secondary | ICD-10-CM | POA: Diagnosis not present

## 2017-08-25 DIAGNOSIS — M159 Polyosteoarthritis, unspecified: Secondary | ICD-10-CM | POA: Diagnosis not present

## 2017-08-30 ENCOUNTER — Ambulatory Visit (INDEPENDENT_AMBULATORY_CARE_PROVIDER_SITE_OTHER): Payer: PPO | Admitting: Orthopedic Surgery

## 2017-08-30 ENCOUNTER — Ambulatory Visit (INDEPENDENT_AMBULATORY_CARE_PROVIDER_SITE_OTHER): Payer: PPO

## 2017-08-30 ENCOUNTER — Encounter (INDEPENDENT_AMBULATORY_CARE_PROVIDER_SITE_OTHER): Payer: Self-pay | Admitting: Orthopedic Surgery

## 2017-08-30 VITALS — Ht 71.0 in | Wt 337.0 lb

## 2017-08-30 DIAGNOSIS — M25572 Pain in left ankle and joints of left foot: Secondary | ICD-10-CM

## 2017-09-13 ENCOUNTER — Encounter (INDEPENDENT_AMBULATORY_CARE_PROVIDER_SITE_OTHER): Payer: Self-pay | Admitting: Orthopedic Surgery

## 2017-09-13 NOTE — Progress Notes (Signed)
Office Visit Note   Patient: [name]           Date of Birth: 10/19/1960           MRN: [id number] Visit Date: 08/30/2017              Requested by: Simone CuriaLee, Keung, MD [address] [place], [place] [id number] PCP: Simone CuriaLee, Keung, MD  Chief Complaint  Patient presents with  . Left Ankle - Follow-up    04/20/17 left tibial calcaneal fusion       HPI: Patient is a 57 year old gentleman who is 4 months status post left tibial calcaneal fusion.  Patient is currently nonweightbearing with a kneeling scooter currently in a fracture boot patient states he is taken 5000 mg of Tylenol a day.  Assessment & Plan: Visit Diagnoses:  1. Pain in left ankle and joints of left foot     Plan: Recommended decreasing his dose of Tylenol.  We will place a 9/16 inch heel lift to try to unload the hindfoot to see if this will relieve his symptoms.  Follow-up in 4 weeks with repeat radiographs.  Follow-Up Instructions: Return in about 4 weeks (around 09/27/2017).   Ortho Exam  Patient is alert, oriented, no adenopathy, well-dressed, normal affect, normal respiratory effort. Examination patient's foot is plantigrade there is no redness no cellulitis no signs of infection.  No skin color or temperature changes.  Imaging: No results found. No images are attached to the encounter.  Labs: No results found for: HGBA1C, ESRSEDRATE, CRP, LABURIC, REPTSTATUS, GRAMSTAIN, CULT, LABORGA   Lab Results  Component Value Date   ALBUMIN 3.9 04/20/2017    Body mass index is 47 kg/m.  Orders:  Orders Placed This Encounter  Procedures  . XR Ankle 2 Views Left   No orders of the defined types were placed in this encounter.    Procedures: No procedures performed  Clinical Data: No additional findings.  ROS:  All other systems negative, except as noted in the HPI. Review of Systems  Objective: Vital Signs: Ht 5\' 11"  (1.803 m)   Wt (!) 337 lb (152.9 kg)   BMI 47.00 kg/m   Specialty  Comments:  No specialty comments available.  PMFS History: Patient Active Problem List   Diagnosis Date Noted  . S/P ankle fusion 04/20/2017  . Post-traumatic osteoarthritis, left ankle and foot   . 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 Impingement of left ankle joint 07/14/2016  . Ankle impingement syndrome, left   . History of total right knee replacement 03/09/2016  . Pain in left ankle and joints of left foot 03/09/2016   Past Medical History:  Diagnosis Date  . Anxiety   . Depression   . DVT (deep venous thrombosis) (HCC)    right clavicle and LLE  . Factor V Leiden (HCC)   . Fractures   . Frontal lobe deficit   . Hirschsprung's disease   . Hypertension   . Impingement syndrome of left ankle   . MRSA infection    left hip   . OA (osteoarthritis)    left ankle  . Sleep apnea    wears CPAP    Family History  Problem Relation Age of Onset  . Heart attack Father     Past Surgical History:  Procedure Laterality Date  . ANKLE ARTHROSCOPY Left 07/14/2016   Procedure: LEFT ANKLE ARTHROSCOPY AND DEBRIDEMENT;  Surgeon: Nadara Mustarduda, Karne Ozga V, MD;  [facility];  Service:  Orthopedics;  Laterality: Left;  . ANKLE FUSION Left 04/20/2017   Procedure: LEFT TIBIOCALCANEAL FUSION;  Surgeon: Nadara Mustarduda, Kenichi Cassada V, MD;  [facility];  Service: Orthopedics;  Laterality: Left;  . COLON SURGERY     Mega colon  . COLONOSCOPY    . [facility]    [facility]  . KNEE ARTHROSCOPY    . LEFT ANKLE SCOPE WITH DEBRIDMENT  Left 07/14/2016   Social History   Occupational History  . Not on file  Tobacco Use  . Smoking status: Current Every Day Smoker    Packs/day: 0.25    Years: 20.00    Pack years: 5.00    Types: Cigarettes  . Smokeless tobacco: Never Used  Substance and Sexual Activity  . Alcohol use: Not Currently  . Drug use: Yes    Types: Marijuana    Comment: hasn't used any in 1 mth  . Sexual activity: Not on file

## 2017-09-27 DIAGNOSIS — Z1339 Encounter for screening examination for other mental health and behavioral disorders: Secondary | ICD-10-CM | POA: Diagnosis not present

## 2017-09-27 DIAGNOSIS — E785 Hyperlipidemia, unspecified: Secondary | ICD-10-CM | POA: Diagnosis not present

## 2017-09-27 DIAGNOSIS — Z0000 Encounter for general adult medical examination without abnormal findings: Secondary | ICD-10-CM | POA: Diagnosis not present

## 2017-09-27 DIAGNOSIS — E669 Obesity, unspecified: Secondary | ICD-10-CM | POA: Diagnosis not present

## 2017-09-27 DIAGNOSIS — Z125 Encounter for screening for malignant neoplasm of prostate: Secondary | ICD-10-CM | POA: Diagnosis not present

## 2017-09-27 DIAGNOSIS — Z7901 Long term (current) use of anticoagulants: Secondary | ICD-10-CM | POA: Diagnosis not present

## 2017-09-27 DIAGNOSIS — D6851 Activated protein C resistance: Secondary | ICD-10-CM | POA: Diagnosis not present

## 2017-09-27 DIAGNOSIS — K589 Irritable bowel syndrome without diarrhea: Secondary | ICD-10-CM | POA: Diagnosis not present

## 2017-09-27 DIAGNOSIS — Z1331 Encounter for screening for depression: Secondary | ICD-10-CM | POA: Diagnosis not present

## 2017-09-27 DIAGNOSIS — Z6841 Body Mass Index (BMI) 40.0 and over, adult: Secondary | ICD-10-CM | POA: Diagnosis not present

## 2017-09-27 DIAGNOSIS — I2699 Other pulmonary embolism without acute cor pulmonale: Secondary | ICD-10-CM | POA: Diagnosis not present

## 2017-09-27 DIAGNOSIS — Z1211 Encounter for screening for malignant neoplasm of colon: Secondary | ICD-10-CM | POA: Diagnosis not present

## 2017-10-01 ENCOUNTER — Encounter (INDEPENDENT_AMBULATORY_CARE_PROVIDER_SITE_OTHER): Payer: Self-pay | Admitting: Orthopedic Surgery

## 2017-10-04 ENCOUNTER — Encounter (INDEPENDENT_AMBULATORY_CARE_PROVIDER_SITE_OTHER): Payer: Self-pay | Admitting: Orthopedic Surgery

## 2017-10-04 ENCOUNTER — Ambulatory Visit (INDEPENDENT_AMBULATORY_CARE_PROVIDER_SITE_OTHER): Payer: PPO | Admitting: Orthopedic Surgery

## 2017-10-06 DIAGNOSIS — I2699 Other pulmonary embolism without acute cor pulmonale: Secondary | ICD-10-CM | POA: Diagnosis not present

## 2017-10-06 DIAGNOSIS — F419 Anxiety disorder, unspecified: Secondary | ICD-10-CM | POA: Diagnosis not present

## 2017-10-06 DIAGNOSIS — K219 Gastro-esophageal reflux disease without esophagitis: Secondary | ICD-10-CM | POA: Diagnosis not present

## 2017-10-06 DIAGNOSIS — Z23 Encounter for immunization: Secondary | ICD-10-CM | POA: Diagnosis not present

## 2017-10-06 DIAGNOSIS — K589 Irritable bowel syndrome without diarrhea: Secondary | ICD-10-CM | POA: Diagnosis not present

## 2017-10-06 DIAGNOSIS — F17201 Nicotine dependence, unspecified, in remission: Secondary | ICD-10-CM | POA: Diagnosis not present

## 2017-10-06 DIAGNOSIS — E291 Testicular hypofunction: Secondary | ICD-10-CM | POA: Diagnosis not present

## 2017-10-06 DIAGNOSIS — R609 Edema, unspecified: Secondary | ICD-10-CM | POA: Diagnosis not present

## 2017-10-06 DIAGNOSIS — Z7901 Long term (current) use of anticoagulants: Secondary | ICD-10-CM | POA: Diagnosis not present

## 2017-10-06 DIAGNOSIS — N5089 Other specified disorders of the male genital organs: Secondary | ICD-10-CM | POA: Diagnosis not present

## 2017-10-06 DIAGNOSIS — D6851 Activated protein C resistance: Secondary | ICD-10-CM | POA: Diagnosis not present

## 2017-10-06 DIAGNOSIS — M159 Polyosteoarthritis, unspecified: Secondary | ICD-10-CM | POA: Diagnosis not present

## 2017-11-01 ENCOUNTER — Ambulatory Visit (INDEPENDENT_AMBULATORY_CARE_PROVIDER_SITE_OTHER): Payer: PPO

## 2017-11-01 ENCOUNTER — Encounter (INDEPENDENT_AMBULATORY_CARE_PROVIDER_SITE_OTHER): Payer: Self-pay | Admitting: Orthopedic Surgery

## 2017-11-01 ENCOUNTER — Ambulatory Visit (INDEPENDENT_AMBULATORY_CARE_PROVIDER_SITE_OTHER): Payer: PPO | Admitting: Orthopedic Surgery

## 2017-11-01 VITALS — Ht 71.0 in | Wt 337.0 lb

## 2017-11-01 DIAGNOSIS — Z981 Arthrodesis status: Secondary | ICD-10-CM

## 2017-11-01 NOTE — Progress Notes (Signed)
Office Visit Note   Patient: [name]           Date of Birth: [birth year]           MRN: [id number] Visit Date: 11/01/2017              Requested by: Simone Curia, MD [address] [place], [place] [address] PCP: Simone Curia, MD  Chief Complaint  Patient presents with  . Left Ankle - Follow-up      HPI: Patient is a 57 year old gentleman status post tibial calcaneal fusion approximately 6 months ago.  Patient states he still has pain with weightbearing.  He is in a fracture boot.  Pain primarily around the ankle.  Assessment & Plan: Visit Diagnoses:  1. S/P ankle fusion     Plan: Discussed that he may have a fibrous union we will place him in a short leg cast follow-up in 3 weeks to replace the cast and then repeat radiographs in 6 weeks.  Follow-Up Instructions: Return in about 3 weeks (around 11/22/2017).   Ortho Exam  Patient is alert, oriented, no adenopathy, well-dressed, normal affect, normal respiratory effort. Examination patient's foot is plantigrade there is no swelling no cellulitis he does have some venous stasis brawny skin color changes but there is no swelling no ulceration his foot is plantigrade there is no redness no cellulitis no signs of infection.  Imaging: Xr Ankle 2 Views Left  Result Date: 11/01/2017 3 view radiographs of the left ankle shows stable fusion there is some lucency around the hardware but no hardware failure no evidence of a nonunion radiographically  No images are attached to the encounter.  Labs: No results found for: HGBA1C, ESRSEDRATE, CRP, LABURIC, REPTSTATUS, GRAMSTAIN, CULT, LABORGA   Lab Results  Component Value Date   ALBUMIN 3.9 04/20/2017    Body mass index is 47 kg/m.  Orders:  Orders Placed This Encounter  Procedures  . XR Ankle 2 Views Left   No orders of the defined types were placed in this encounter.    Procedures: No procedures performed  Clinical Data: No additional  findings.  ROS:  All other systems negative, except as noted in the HPI. Review of Systems  Objective: Vital Signs: Ht 5\' 11"  (1.803 m)   Wt (!) 337 lb (152.9 kg)   BMI 47.00 kg/m   Specialty Comments:  No specialty comments available.  PMFS History: Patient Active Problem List   Diagnosis Date Noted  . S/P ankle fusion 04/20/2017  . Post-traumatic osteoarthritis, left ankle and foot   . 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 Impingement of left ankle joint 07/14/2016  . Ankle impingement syndrome, left   . History of total right knee replacement 03/09/2016  . Pain in left ankle and joints of left foot 03/09/2016   Past Medical History:  Diagnosis Date  . Anxiety   . Depression   . DVT (deep venous thrombosis) (HCC)    right clavicle and LLE  . Factor V Leiden (HCC)   . Fractures   . Frontal lobe deficit   . Hirschsprung's disease   . Hypertension   . Impingement syndrome of left ankle   . MRSA infection    left hip   . OA (osteoarthritis)    left ankle  . Sleep apnea    wears CPAP    Family History  Problem Relation Age of Onset  . Heart attack Father     Past Surgical History:  Procedure Laterality Date  . ANKLE ARTHROSCOPY Left 07/14/2016   Procedure: LEFT ANKLE ARTHROSCOPY AND DEBRIDEMENT;  Surgeon: Nadara Mustard, MD;  Location: [facility] OR;  Service: Orthopedics;  Laterality: Left;  . ANKLE FUSION Left 04/20/2017   Procedure: LEFT TIBIOCALCANEAL FUSION;  Surgeon: Nadara Mustard, MD;  Location: [facility] OR;  Service: Orthopedics;  Laterality: Left;  . COLON SURGERY     Mega colon  . COLONOSCOPY    . HIP SURGERY Left    [facility]  . KNEE ARTHROSCOPY    . LEFT ANKLE SCOPE WITH DEBRIDMENT  Left 07/14/2016   Social History   Occupational History  . Not on file  Tobacco Use  . Smoking status: Current Every Day Smoker    Packs/day: 0.25    Years: 20.00    Pack years: 5.00    Types: Cigarettes  . Smokeless tobacco: Never Used  Substance and Sexual  Activity  . Alcohol use: Not Currently  . Drug use: Yes    Types: Marijuana    Comment: hasn't used any in 1 mth  . Sexual activity: Not on file

## 2017-11-08 DIAGNOSIS — M159 Polyosteoarthritis, unspecified: Secondary | ICD-10-CM | POA: Diagnosis not present

## 2017-11-08 DIAGNOSIS — D6851 Activated protein C resistance: Secondary | ICD-10-CM | POA: Diagnosis not present

## 2017-11-08 DIAGNOSIS — F17201 Nicotine dependence, unspecified, in remission: Secondary | ICD-10-CM | POA: Diagnosis not present

## 2017-11-08 DIAGNOSIS — Z7901 Long term (current) use of anticoagulants: Secondary | ICD-10-CM | POA: Diagnosis not present

## 2017-11-08 DIAGNOSIS — K589 Irritable bowel syndrome without diarrhea: Secondary | ICD-10-CM | POA: Diagnosis not present

## 2017-11-08 DIAGNOSIS — K219 Gastro-esophageal reflux disease without esophagitis: Secondary | ICD-10-CM | POA: Diagnosis not present

## 2017-11-08 DIAGNOSIS — F419 Anxiety disorder, unspecified: Secondary | ICD-10-CM | POA: Diagnosis not present

## 2017-11-08 DIAGNOSIS — I2699 Other pulmonary embolism without acute cor pulmonale: Secondary | ICD-10-CM | POA: Diagnosis not present

## 2017-11-08 DIAGNOSIS — N5089 Other specified disorders of the male genital organs: Secondary | ICD-10-CM | POA: Diagnosis not present

## 2017-11-08 DIAGNOSIS — E291 Testicular hypofunction: Secondary | ICD-10-CM | POA: Diagnosis not present

## 2017-11-08 DIAGNOSIS — E8881 Metabolic syndrome: Secondary | ICD-10-CM | POA: Diagnosis not present

## 2017-11-22 ENCOUNTER — Ambulatory Visit (INDEPENDENT_AMBULATORY_CARE_PROVIDER_SITE_OTHER): Payer: PPO | Admitting: Physician Assistant

## 2017-11-22 ENCOUNTER — Encounter (INDEPENDENT_AMBULATORY_CARE_PROVIDER_SITE_OTHER): Payer: Self-pay | Admitting: Orthopedic Surgery

## 2017-11-22 VITALS — Ht 71.0 in | Wt 337.0 lb

## 2017-11-22 DIAGNOSIS — M25572 Pain in left ankle and joints of left foot: Secondary | ICD-10-CM

## 2017-11-22 DIAGNOSIS — Z981 Arthrodesis status: Secondary | ICD-10-CM | POA: Diagnosis not present

## 2017-11-22 DIAGNOSIS — M19172 Post-traumatic osteoarthritis, left ankle and foot: Secondary | ICD-10-CM

## 2017-11-22 NOTE — Progress Notes (Signed)
Office Visit Note   Patient: [name]           Date of Birth: 11-Oct-1960           MRN: [id number] Visit Date: 11/22/2017              Requested by: Simone Curia, MD [address] [place], [place] [address] PCP: Simone Curia, MD  Chief Complaint  Patient presents with  . Left Ankle - Follow-up      HPI: The patient is a 57 yo male who is seen for post operative follow up following a left ankle fusion. He has been in a short leg cast and using his knee scooter, non weight bearing for 3 weeks. He does not feel his pain was much different over the past weeks. He reports pain over the distal calf and mid foot. He reports the pain medication does not help much.   Assessment & Plan: Visit Diagnoses:  1. S/P ankle fusion   2. Pain in left ankle and joints of left foot   3. Post-traumatic osteoarthritis, left ankle and foot     Plan: Short leg cast was reapplied and he will continue to use his knee scooter and remain non weight bearing as much as possible over the next weeks. follow up in 3 weeks and will need follow up x-rays of the left ankle at this time.   Follow-Up Instructions: Return in about 3 weeks (around 12/13/2017).   Ortho Exam  Patient is alert, oriented, no adenopathy, well-dressed, normal affect, normal respiratory effort. Left ankle/foot without breakdown. Foot is plantigrade. No cellulitis.   Imaging: No results found. No images are attached to the encounter.  Labs: No results found for: HGBA1C, ESRSEDRATE, CRP, LABURIC, REPTSTATUS, GRAMSTAIN, CULT, LABORGA   Lab Results  Component Value Date   ALBUMIN 3.9 04/20/2017    Body mass index is 47 kg/m.  Orders:  No orders of the defined types were placed in this encounter.  No orders of the defined types were placed in this encounter.    Procedures: No procedures performed  Clinical Data: No additional findings.  ROS:  All other systems negative, except as noted in the HPI. Review  of Systems  Objective: Vital Signs: Ht 5\' 11"  (1.803 m)   Wt (!) 337 lb (152.9 kg)   BMI 47.00 kg/m   Specialty Comments:  No specialty comments available.  PMFS History: Patient Active Problem List   Diagnosis Date Noted  . S/P ankle fusion 04/20/2017  . Post-traumatic osteoarthritis, left ankle and foot   . 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 Impingement of left ankle joint 07/14/2016  . Ankle impingement syndrome, left   . History of total right knee replacement 03/09/2016  . Pain in left ankle and joints of left foot 03/09/2016   Past Medical History:  Diagnosis Date  . Anxiety   . Depression   . DVT (deep venous thrombosis) (HCC)    right clavicle and LLE  . Factor V Leiden (HCC)   . Fractures   . Frontal lobe deficit   . Hirschsprung's disease   . Hypertension   . Impingement syndrome of left ankle   . MRSA infection    left hip   . OA (osteoarthritis)    left ankle  . Sleep apnea    wears CPAP    Family History  Problem Relation Age of Onset  . Heart attack Father     Past  Surgical History:  Procedure Laterality Date  . ANKLE ARTHROSCOPY Left 07/14/2016   Procedure: LEFT ANKLE ARTHROSCOPY AND DEBRIDEMENT;  Surgeon: Nadara Mustard, MD;  Location[facility] OR;  Service: Orthopedics;  Laterality: Left;  . ANKLE FUSION Left 04/20/2017   Procedure: LEFT TIBIOCALCANEAL FUSION;  Surgeon: Nadara Mustard, MD;  Location: [facility] OR;  Service: Orthopedics;  Laterality: Left;  . COLON SURGERY     Mega colon  . COLONOSCOPY    . HIP SURGERY Left    At [facility]  . KNEE ARTHROSCOPY    . LEFT ANKLE SCOPE WITH DEBRIDMENT  Left 07/14/2016   Social History   Occupational History  . Not on file  Tobacco Use  . Smoking status: Current Every Day Smoker    Packs/day: 0.25    Years: 20.00    Pack years: 5.00    Types: Cigarettes  . Smokeless tobacco: Never Used  Substance and Sexual Activity  . Alcohol use: Not Currently  . Drug use: Yes    Types: Marijuana     Comment: hasn't used any in 1 mth  . Sexual activity: Not on file

## 2017-12-02 ENCOUNTER — Other Ambulatory Visit: Payer: Self-pay | Admitting: Pharmacist

## 2017-12-02 NOTE — Patient Outreach (Signed)
[facility]) Care Management  12/02/2017  THORNE WIRZ [birth year] [id number]   Incoming call from [name] in response to the [facility] Medication Adherence Campaign. Speak with patient. HIPAA identifiers verified and verbal consent received.  Medication Adherence  [name] reports that he takes his atorvastatin 20 mg daily as directed. Denies any missed doses or barriers to adherence. Counsel on the importance of medication adherence. Patient verbalizes understanding.   Coordination of Care  Patient reports frustration with his ability to access a psychiatrist in the area. Reports that he has ongoing depression, currently managed by Dr. Nedra Hai (PCP), whom he has an appointment with next week. States that he is doing Okay right now, but is concerned about establishing care in case he needs more support in the future. Reports that he previously saw a provider at [facility], but that he had a bad experience at this practice. Review in-network provider list on the [employer] website with patient and note that the next closest Psychiatrists listed are in [place] [facility] and [place]. Patient reports difficulty with obtaining transportation to either town. Reports that he regularly uses the [employer] [place] but that he would not be able to use this transit to get him to [place]  [facility] or [employer] at reasonable hours for an appointment. Reports that he is not currently able to drive his car and does not have anyone who could drive him. Patient interested in speaking with one of our Social Workers to determine if there is any further transportation/resources available to him prior to discussing getting a new psychiatrist referral from his PCP.  Patient denies any further medication questions/concerns at this time. Confirm that patient has my phone number.  PLAN   1) Will send a referral to [facility] Social work.  2) Will  close pharmacy episode.   Duanne Moron, PharmD, [facility] Clinical Pharmacist [facility] Care Management [phone]

## 2017-12-05 ENCOUNTER — Telehealth (INDEPENDENT_AMBULATORY_CARE_PROVIDER_SITE_OTHER): Payer: Self-pay | Admitting: Orthopedic Surgery

## 2017-12-05 NOTE — Telephone Encounter (Signed)
Returned call to patient left message to call back. Patient want to reschedule his appointment for Dec. 3rd  [phone]

## 2017-12-06 ENCOUNTER — Other Ambulatory Visit: Payer: Self-pay | Admitting: *Deleted

## 2017-12-06 NOTE — Patient Outreach (Signed)
[facility]N) Care Management  12/06/2017  [name] 08/27/1960 [id number]   CSW made initial contact with pt by phone today. CSW introduced self and confirmed pt's identity. CSW advised pt of reason for call; referral for depression, Psychiatry need, transportation/resources, etc. . Pt admits to having long history of depression including a suicide attempt in 2012. "[facility]". He denies any other SI/HI and is currently being treated for depression by his PCP. He feels like his current RX regimen may not be working well and plans to discuss at PCP viist.  Pt uses [facility] to get to some appointments and hopes to eventually be able to drive again. "I want to get a car and drive off and a [employer] type".  He currently has a foot cast and indicates he is limited in his ability to get to groceries, appointments, etc.   Pt with a lot of energy and interrupts often which made this telephonic screening challenging.  CSW plans to request pt receive by mail info on food pantry/support options, mental health care, and community based services.   CSW offered to call pt back in 1-2 weeks to assess further for interest and needs.   Reece LevyJanet Dela Sweeny, MSW, LCSW Clinical Social Worker  [facility] [phone]          Reece LevyJanet Jacklin Zwick, MSW, LCSW Clinical Social Worker  [facility] [phone]

## 2017-12-07 NOTE — Patient Outreach (Signed)
[facility]) Care Management  12/07/2017  Johnette Abrahamhomas O Alaniz 03/20/1960 [id number]  BSW mailed resources as requested by CSW Reece LevyJanet Caldwell.  Bevelyn NgoKendra Dahiana Kulak, BSW, CDP [facility] Care Management Social Worker [phone]

## 2017-12-09 ENCOUNTER — Encounter: Payer: Self-pay | Admitting: Internal Medicine

## 2017-12-09 DIAGNOSIS — E8881 Metabolic syndrome: Secondary | ICD-10-CM | POA: Diagnosis not present

## 2017-12-09 DIAGNOSIS — Z7901 Long term (current) use of anticoagulants: Secondary | ICD-10-CM | POA: Diagnosis not present

## 2017-12-09 DIAGNOSIS — K589 Irritable bowel syndrome without diarrhea: Secondary | ICD-10-CM | POA: Diagnosis not present

## 2017-12-09 DIAGNOSIS — I10 Essential (primary) hypertension: Secondary | ICD-10-CM | POA: Diagnosis not present

## 2017-12-09 DIAGNOSIS — I2699 Other pulmonary embolism without acute cor pulmonale: Secondary | ICD-10-CM | POA: Diagnosis not present

## 2017-12-09 DIAGNOSIS — F3341 Major depressive disorder, recurrent, in partial remission: Secondary | ICD-10-CM | POA: Diagnosis not present

## 2017-12-09 DIAGNOSIS — F419 Anxiety disorder, unspecified: Secondary | ICD-10-CM | POA: Diagnosis not present

## 2017-12-09 DIAGNOSIS — E7800 Pure hypercholesterolemia, unspecified: Secondary | ICD-10-CM | POA: Diagnosis not present

## 2017-12-09 DIAGNOSIS — M159 Polyosteoarthritis, unspecified: Secondary | ICD-10-CM | POA: Diagnosis not present

## 2017-12-09 DIAGNOSIS — N5089 Other specified disorders of the male genital organs: Secondary | ICD-10-CM | POA: Diagnosis not present

## 2017-12-09 DIAGNOSIS — D6851 Activated protein C resistance: Secondary | ICD-10-CM | POA: Diagnosis not present

## 2017-12-09 DIAGNOSIS — R609 Edema, unspecified: Secondary | ICD-10-CM | POA: Diagnosis not present

## 2017-12-09 DIAGNOSIS — E291 Testicular hypofunction: Secondary | ICD-10-CM | POA: Diagnosis not present

## 2017-12-09 DIAGNOSIS — K219 Gastro-esophageal reflux disease without esophagitis: Secondary | ICD-10-CM | POA: Diagnosis not present

## 2017-12-13 ENCOUNTER — Ambulatory Visit (INDEPENDENT_AMBULATORY_CARE_PROVIDER_SITE_OTHER): Payer: PPO | Admitting: Orthopedic Surgery

## 2017-12-13 ENCOUNTER — Other Ambulatory Visit: Payer: Self-pay | Admitting: *Deleted

## 2017-12-13 NOTE — Patient Outreach (Signed)
[facility](THN) Care Management  12/13/2017  [name] 09/14/1960 [id number]  CSW spoke to pt by phone today. Pt reports his foot is "not doing well". CSW inquired about his depression to which he states; "It's situational" and related to his foot, pain and overall health decline. CSW discussed the resources that may be of assistance to him and will have these mailed to him and plan a follow up call thereafter to discuss and assist further. Pt declines the need for an assessment or treatment plan for his depression/mental health needs and states, "I will get back with you".  He does agree to CSW contacting him again in 1-2 weeks for follow up.      Erik LevyJanet Emit Woods, MSW, LCSW Clinical Social Worker  [facility] [phone]

## 2017-12-15 NOTE — Patient Outreach (Signed)
[facility]) Care Management  12/15/2017  [name] 08/25/1960 [id number]  BSW mailed resources to the patient as requested by CSW Reece LevyJanet Caldwell.  Bevelyn NgoKendra Cevin Rubinstein, BSW, CDP [facility] Care [facility] Social Worker [phone]

## 2017-12-16 ENCOUNTER — Other Ambulatory Visit: Payer: Self-pay | Admitting: Pharmacist

## 2017-12-16 DIAGNOSIS — Z7901 Long term (current) use of anticoagulants: Secondary | ICD-10-CM | POA: Diagnosis not present

## 2017-12-16 NOTE — Patient Outreach (Signed)
[facility]) Care Management  12/16/2017  [name] 11-Apr-1960 [id number]   Call patient's PCP office to request patient's latest eGFR result and request that provider consider changing patient's venlafaxine prescription from venlafaxine ER 225 mg - 1 tablet once daily to venlafaxine ER 75 mg - 3 capsules once daily, for cost savings.  Speak with Joellen Jersey in Dr. Marguerita Beards office who reports that patient's most recent eGFR, on 12/09/17, was 117 mL/min. States that she will let Dr. Truman Hayward know that patient's venlafaxine ER 225 mg - 1 tablet once daily can be changed to venlafaxine ER 75 mg - 3 capsules once daily, for cost savings.    Harlow Asa, PharmD, [place] Management [phone]

## 2017-12-16 NOTE — Patient Outreach (Signed)
[facility]) Care Management  12/16/2017  [name] 06/12/1960 [id number]   Call back to Mr. Conteh to let him know that I called his PCP's office regarding the change to patient's venlafaxine prescription for cost savings. Patient verbalizes understanding.  Denies any further medication questions/concerns at this time. Confirm that patient has my phone number.  Will close pharmacy episode at this time.  Duanne MoronElisabeth Gordan Grell, PharmD, [facility] Clinical Pharmacist [facility] Care Management [phone]

## 2017-12-16 NOTE — Patient Outreach (Signed)
[facility]) Care Management  12/16/2017  JAXYN ROUT Nov 25, 1960 [id number]   Receive a voicemail from Mr. Weyrauch requesting a call back. Call and speak with patient. HIPAA identifiers verified.  Mr. Dobrowski reports that he called my number by mistake yesterday. However, reports that his PCP recently increased his dose of Effexor XR from 150 mg daily to 225 mg daily. Reports that he picked up the new prescription last week, but was told at the pharmacy that it would only be covered for this refill.   Review patient's Medicare Part D formulary from the [employer] website and note that the venlafaxine ER 225 mg tablet is a tier 4 option through the patient's plan. However, note that venlafaxine ER 150 and ER 75 mg capsules are both tier 1 options through his plan.  PLAN  Will call patient's PCP office to request patient's latest eGFR result and request that provider consider changing patient's venlafaxine prescription from venlafaxine ER 225 mg - 1 tablet once daily to venlafaxine ER 75 mg - 3 capsules once daily, for cost savings.  Harlow Asa, PharmD, [place] Management [phone]

## 2017-12-20 ENCOUNTER — Ambulatory Visit (INDEPENDENT_AMBULATORY_CARE_PROVIDER_SITE_OTHER): Payer: PPO | Admitting: Orthopedic Surgery

## 2017-12-23 ENCOUNTER — Other Ambulatory Visit: Payer: Self-pay | Admitting: *Deleted

## 2017-12-23 NOTE — Patient Outreach (Signed)
[facility]N) Care Management  12/23/2017  [name] 06/07/1960 [id number]   CSW spoke with pt by phone today.  Pt reports "I still have it" when asked how his foot was doing. Pt reports he will see his MD on Tuesday of next week. He is still uncertain if he wants to be linked with any mental health support/services. He also reports he has not received the mailed info.  CSW will plan a f/u call next week after his MD appointment for updates and to see if he has received the info mailed.   Reece LevyJanet Kelin Borum, MSW, LCSW Clinical Social Worker  [facility] [phone]

## 2017-12-26 ENCOUNTER — Telehealth (INDEPENDENT_AMBULATORY_CARE_PROVIDER_SITE_OTHER): Payer: Self-pay | Admitting: Orthopedic Surgery

## 2017-12-26 NOTE — Telephone Encounter (Signed)
I called and sw pt and he advised that he is not able to come in for his appt and has had his cast on since 11/23/17 and that he was supposed to come in and have it changed and xrays and he is just not financially able to come in and has unreliable transportation. I advised the pt that we are here Monday through Friday 8-5 and any day at any time that he is ale to come in to call ahead and let us know  And we can work him in to be seen. He needs to have the cast removed and xrays to eval healing. Pt states that he will work on it  this week.

## 2017-12-26 NOTE — Telephone Encounter (Signed)
Patient called expecting call nack

## 2017-12-27 ENCOUNTER — Ambulatory Visit (INDEPENDENT_AMBULATORY_CARE_PROVIDER_SITE_OTHER): Payer: PPO | Admitting: Orthopedic Surgery

## 2017-12-29 ENCOUNTER — Ambulatory Visit: Payer: Self-pay | Admitting: *Deleted

## 2018-01-03 ENCOUNTER — Other Ambulatory Visit: Payer: Self-pay | Admitting: *Deleted

## 2018-01-03 NOTE — Patient Outreach (Signed)
[facility]) Care Management  01/03/2018  [name] O Callan 05/20/1960 [id number]   CSW made contact with pt who reports he had to cancel a few appointments because he did not have money for a ride. "[facility] charges me $10".  Pt reminded to outreach [facility] in the future if there is any barrier including transportation that we can assist with .  Pt has rescheduled his PCP visit for next week and CSW will ask Bevelyn NgoKendra Humble, BSW, [facility], to contact pt to assess and assist.   Pt otherwise states no concerns or needs- he dismisses any depression and wants to "get back with ya" if needs arise.   CSW will plan a f/u call in 7-10 days for follow up.    Reece LevyJanet Derek Huneycutt, MSW, LCSW Clinical Social Worker  [facility] [phone]

## 2018-01-06 DIAGNOSIS — N5089 Other specified disorders of the male genital organs: Secondary | ICD-10-CM | POA: Diagnosis not present

## 2018-01-06 DIAGNOSIS — E291 Testicular hypofunction: Secondary | ICD-10-CM | POA: Diagnosis not present

## 2018-01-06 DIAGNOSIS — F17201 Nicotine dependence, unspecified, in remission: Secondary | ICD-10-CM | POA: Diagnosis not present

## 2018-01-06 DIAGNOSIS — F419 Anxiety disorder, unspecified: Secondary | ICD-10-CM | POA: Diagnosis not present

## 2018-01-06 DIAGNOSIS — E8881 Metabolic syndrome: Secondary | ICD-10-CM | POA: Diagnosis not present

## 2018-01-06 DIAGNOSIS — R609 Edema, unspecified: Secondary | ICD-10-CM | POA: Diagnosis not present

## 2018-01-06 DIAGNOSIS — K219 Gastro-esophageal reflux disease without esophagitis: Secondary | ICD-10-CM | POA: Diagnosis not present

## 2018-01-06 DIAGNOSIS — M159 Polyosteoarthritis, unspecified: Secondary | ICD-10-CM | POA: Diagnosis not present

## 2018-01-06 DIAGNOSIS — I2699 Other pulmonary embolism without acute cor pulmonale: Secondary | ICD-10-CM | POA: Diagnosis not present

## 2018-01-06 DIAGNOSIS — K589 Irritable bowel syndrome without diarrhea: Secondary | ICD-10-CM | POA: Diagnosis not present

## 2018-01-06 DIAGNOSIS — D6851 Activated protein C resistance: Secondary | ICD-10-CM | POA: Diagnosis not present

## 2018-01-06 DIAGNOSIS — Z7901 Long term (current) use of anticoagulants: Secondary | ICD-10-CM | POA: Diagnosis not present

## 2018-01-09 ENCOUNTER — Ambulatory Visit (INDEPENDENT_AMBULATORY_CARE_PROVIDER_SITE_OTHER): Payer: PPO | Admitting: Orthopedic Surgery

## 2018-01-09 ENCOUNTER — Ambulatory Visit (INDEPENDENT_AMBULATORY_CARE_PROVIDER_SITE_OTHER): Payer: PPO

## 2018-01-09 ENCOUNTER — Encounter (INDEPENDENT_AMBULATORY_CARE_PROVIDER_SITE_OTHER): Payer: Self-pay | Admitting: Orthopedic Surgery

## 2018-01-09 VITALS — Ht 71.0 in | Wt 337.0 lb

## 2018-01-09 DIAGNOSIS — Z981 Arthrodesis status: Secondary | ICD-10-CM

## 2018-01-09 NOTE — Progress Notes (Signed)
Office Visit Note   Patient: [name]           Date of Birth: 1960-02-26           MRN: [id number] Visit Date: 01/09/2018              Requested by: Simone Curia, MD [address] [place], [place] [address] PCP: Simone Curia, MD  Chief Complaint  Patient presents with  . Left Ankle - Follow-up      HPI: Patient presents follow-up status post tibial calcaneal fusion.  Patient has complaints of pain with ambulation with the fracture boot.  Assessment & Plan: Visit Diagnoses:  1. S/P ankle fusion     Plan: We will place him in a short leg cast to see if this will help decrease the micromotion at the fusion site who will use the fracture boot on top of the cast.  Repeat three-view radiographs of the left ankle at follow-up.  Follow-Up Instructions: Return in about 4 weeks (around 02/06/2018).   Ortho Exam  Patient is alert, oriented, no adenopathy, well-dressed, normal affect, normal respiratory effort. Examination his foot is plantigrade there is no redness no cellulitis no drainage no signs of infection.  There is no swelling.  Imaging: Xr Ankle Complete Left  Result Date: 01/09/2018 3 view radiographs of the left ankle shows stable healing of the ankle fusion.  The screw tracks from the retained hardware have filled in.  There is no periosteal reaction no signs of osteomyelitis.  No images are attached to the encounter.  Labs: No results found for: HGBA1C, ESRSEDRATE, CRP, LABURIC, REPTSTATUS, GRAMSTAIN, CULT, LABORGA   Lab Results  Component Value Date   ALBUMIN 3.9 04/20/2017    Body mass index is 47 kg/m.  Orders:  Orders Placed This Encounter  Procedures  . XR Ankle Complete Left   No orders of the defined types were placed in this encounter.    Procedures: No procedures performed  Clinical Data: No additional findings.  ROS:  All other systems negative, except as noted in the HPI. Review of Systems  Objective: Vital Signs: Ht  5\' 11"  (1.803 m)   Wt (!) 337 lb (152.9 kg)   BMI 47.00 kg/m   Specialty Comments:  No specialty comments available.  PMFS History: Patient Active Problem List   Diagnosis Date Noted  . S/P ankle fusion 04/20/2017  . Post-traumatic osteoarthritis, left ankle and foot   . 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 Impingement of left ankle joint 07/14/2016  . Ankle impingement syndrome, left   . History of total right knee replacement 03/09/2016  . Pain in left ankle and joints of left foot 03/09/2016   Past Medical History:  Diagnosis Date  . Anxiety   . Depression   . DVT (deep venous thrombosis) (HCC)    right clavicle and LLE  . Factor V Leiden (HCC)   . Fractures   . Frontal lobe deficit   . Hirschsprung's disease   . Hypertension   . Impingement syndrome of left ankle   . MRSA infection    left hip   . OA (osteoarthritis)    left ankle  . Sleep apnea    wears CPAP    Family History  Problem Relation Age of Onset  . Heart attack Father     Past Surgical History:  Procedure Laterality Date  . ANKLE ARTHROSCOPY Left 07/14/2016   Procedure: LEFT ANKLE ARTHROSCOPY AND DEBRIDEMENT;  Surgeon: Nadara Mustarduda, Hatsuko Bizzarro V, MD;  [facility];  Service: Orthopedics;  Laterality: Left;  . ANKLE FUSION Left 04/20/2017   Procedure: LEFT TIBIOCALCANEAL FUSION;  Surgeon: Nadara Mustarduda, Shalynn Jorstad V, MD;  [facility];  Service: Orthopedics;  Laterality: Left;  . COLON SURGERY     Mega colon  . COLONOSCOPY    . [facility]    [facility]  . KNEE ARTHROSCOPY    . LEFT ANKLE SCOPE WITH DEBRIDMENT  Left 07/14/2016   Social History   Occupational History  . Not on file  Tobacco Use  . Smoking status: Current Every Day Smoker    Packs/day: 0.25    Years: 20.00    Pack years: 5.00    Types: Cigarettes  . Smokeless tobacco: Never Used  Substance and Sexual Activity  . Alcohol use: Not Currently  . Drug use: Yes    Types: Marijuana    Comment: hasn't used any in 1 mth  . Sexual  activity: Not on file

## 2018-01-13 DIAGNOSIS — Z7901 Long term (current) use of anticoagulants: Secondary | ICD-10-CM | POA: Diagnosis not present

## 2018-01-13 DIAGNOSIS — E8881 Metabolic syndrome: Secondary | ICD-10-CM | POA: Diagnosis not present

## 2018-01-13 DIAGNOSIS — E291 Testicular hypofunction: Secondary | ICD-10-CM | POA: Diagnosis not present

## 2018-01-13 DIAGNOSIS — N5089 Other specified disorders of the male genital organs: Secondary | ICD-10-CM | POA: Diagnosis not present

## 2018-01-13 DIAGNOSIS — I2699 Other pulmonary embolism without acute cor pulmonale: Secondary | ICD-10-CM | POA: Diagnosis not present

## 2018-01-13 DIAGNOSIS — F17201 Nicotine dependence, unspecified, in remission: Secondary | ICD-10-CM | POA: Diagnosis not present

## 2018-01-13 DIAGNOSIS — M159 Polyosteoarthritis, unspecified: Secondary | ICD-10-CM | POA: Diagnosis not present

## 2018-01-13 DIAGNOSIS — D6851 Activated protein C resistance: Secondary | ICD-10-CM | POA: Diagnosis not present

## 2018-01-13 DIAGNOSIS — R609 Edema, unspecified: Secondary | ICD-10-CM | POA: Diagnosis not present

## 2018-01-13 DIAGNOSIS — K219 Gastro-esophageal reflux disease without esophagitis: Secondary | ICD-10-CM | POA: Diagnosis not present

## 2018-01-13 DIAGNOSIS — F419 Anxiety disorder, unspecified: Secondary | ICD-10-CM | POA: Diagnosis not present

## 2018-01-13 DIAGNOSIS — K589 Irritable bowel syndrome without diarrhea: Secondary | ICD-10-CM | POA: Diagnosis not present

## 2018-02-01 ENCOUNTER — Encounter: Payer: Self-pay | Admitting: *Deleted

## 2018-02-01 ENCOUNTER — Other Ambulatory Visit: Payer: Self-pay | Admitting: *Deleted
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
## 2018-02-09 ENCOUNTER — Ambulatory Visit (INDEPENDENT_AMBULATORY_CARE_PROVIDER_SITE_OTHER): Payer: PPO

## 2018-02-09 ENCOUNTER — Encounter (INDEPENDENT_AMBULATORY_CARE_PROVIDER_SITE_OTHER): Payer: Self-pay | Admitting: Orthopedic Surgery

## 2018-02-09 ENCOUNTER — Ambulatory Visit (INDEPENDENT_AMBULATORY_CARE_PROVIDER_SITE_OTHER): Payer: PPO | Admitting: Orthopedic Surgery

## 2018-02-09 VITALS — Ht 71.0 in | Wt 337.0 lb

## 2018-02-09 DIAGNOSIS — Z981 Arthrodesis status: Secondary | ICD-10-CM

## 2018-02-10 ENCOUNTER — Encounter (INDEPENDENT_AMBULATORY_CARE_PROVIDER_SITE_OTHER): Payer: Self-pay | Admitting: Orthopedic Surgery

## 2018-02-10 NOTE — Progress Notes (Signed)
Office Visit Note   Patient: [name]           Date of Birth: 10/10/1960           MRN: [id number] Visit Date: 02/09/2018              Requested by: Simone CuriaLee, Keung, MD [address] [place], [place] [id number] PCP: Simone CuriaLee, Keung, MD  Chief Complaint  Patient presents with  . Left Ankle - Follow-up    S/p ankle fusion       HPI: Patient is status post tibial calcaneal fusion he has been in a cast he states he is asymptomatic with weightbearing.  Assessment & Plan: Visit Diagnoses:  1. S/P ankle fusion     Plan: Patient will advance to his fracture boot and then to regular shoewear as he feels comfortable.  Repeat radiographs at follow-up.  Follow-Up Instructions: Return in about 4 weeks (around 03/09/2018).   Ortho Exam  Patient is alert, oriented, no adenopathy, well-dressed, normal affect, normal respiratory effort. Examination patient's foot is plantigrade there is no redness no cellulitis no swelling there is good wrinkling of the skin no signs of infection.  Recommend patient use a 916 inch heel lift when he advances to his fracture boot and shoe.  Imaging: No results found. No images are attached to the encounter.  Labs: No results found for: HGBA1C, ESRSEDRATE, CRP, LABURIC, REPTSTATUS, GRAMSTAIN, CULT, LABORGA   Lab Results  Component Value Date   ALBUMIN 3.9 04/20/2017    Body mass index is 47 kg/m.  Orders:  Orders Placed This Encounter  Procedures  . XR Ankle Complete Left   No orders of the defined types were placed in this encounter.    Procedures: No procedures performed  Clinical Data: No additional findings.  ROS:  All other systems negative, except as noted in the HPI. Review of Systems  Objective: Vital Signs: Ht 5\' 11"  (1.803 m)   Wt (!) 337 lb (152.9 kg)   BMI 47.00 kg/m   Specialty Comments:  No specialty comments available.  PMFS History: Patient Active Problem List   Diagnosis Date Noted  . S/P ankle  fusion 04/20/2017  . Post-traumatic osteoarthritis, left ankle and foot   . 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 Impingement of left ankle joint 07/14/2016  . Ankle impingement syndrome, left   . History of total right knee replacement 03/09/2016  . Pain in left ankle and joints of left foot 03/09/2016   Past Medical History:  Diagnosis Date  . Anxiety   . Depression   . DVT (deep venous thrombosis) (HCC)    right clavicle and LLE  . Factor V Leiden (HCC)   . Fractures   . Frontal lobe deficit   . Hirschsprung's disease   . Hypertension   . Impingement syndrome of left ankle   . MRSA infection    left hip   . OA (osteoarthritis)    left ankle  . Sleep apnea    wears CPAP    Family History  Problem Relation Age of Onset  . Heart attack Father     Past Surgical History:  Procedure Laterality Date  . ANKLE ARTHROSCOPY Left 07/14/2016   Procedure: LEFT ANKLE ARTHROSCOPY AND DEBRIDEMENT;  Surgeon: Nadara Mustarduda, Naithan Delage V, MD;  [facility];  Service: Orthopedics;  Laterality: Left;  . ANKLE FUSION Left 04/20/2017   Procedure: LEFT TIBIOCALCANEAL FUSION;  Surgeon: Nadara Mustarduda, Shan Padgett V, MD;  [facility];  Service: Orthopedics;  Laterality: Left;  . COLON SURGERY     Mega colon  . COLONOSCOPY    . HIP SURGERY Left    At [facility]  . KNEE ARTHROSCOPY    . LEFT ANKLE SCOPE WITH DEBRIDMENT  Left 07/14/2016   Social History   Occupational History  . Not on file  Tobacco Use  . Smoking status: Current Every Day Smoker    Packs/day: 0.25    Years: 20.00    Pack years: 5.00    Types: Cigarettes  . Smokeless tobacco: Never Used  Substance and Sexual Activity  . Alcohol use: Not Currently  . Drug use: Yes    Types: Marijuana    Comment: hasn't used any in 1 mth  . Sexual activity: Not on file

## 2018-02-27 DIAGNOSIS — I10 Essential (primary) hypertension: Secondary | ICD-10-CM | POA: Diagnosis not present

## 2018-02-27 DIAGNOSIS — H409 Unspecified glaucoma: Secondary | ICD-10-CM | POA: Diagnosis not present

## 2018-02-27 DIAGNOSIS — H548 Legal blindness, as defined in USA: Secondary | ICD-10-CM | POA: Diagnosis not present

## 2018-02-27 DIAGNOSIS — K6389 Other specified diseases of intestine: Secondary | ICD-10-CM | POA: Diagnosis not present

## 2018-03-07 ENCOUNTER — Telehealth (INDEPENDENT_AMBULATORY_CARE_PROVIDER_SITE_OTHER): Payer: Self-pay | Admitting: Orthopedic Surgery

## 2018-03-07 NOTE — Telephone Encounter (Signed)
Patient called he is still experiencing swelling and a lot of pain. He is currently unable to make it to his appointment due to a possibility of   being evicted. Patient phone is currently on at the moment he is not able  to make outgoing calls. Please call patient to advise

## 2018-03-08 DIAGNOSIS — Z86711 Personal history of pulmonary embolism: Secondary | ICD-10-CM | POA: Diagnosis not present

## 2018-03-08 DIAGNOSIS — K219 Gastro-esophageal reflux disease without esophagitis: Secondary | ICD-10-CM | POA: Diagnosis not present

## 2018-03-08 DIAGNOSIS — F17210 Nicotine dependence, cigarettes, uncomplicated: Secondary | ICD-10-CM | POA: Diagnosis not present

## 2018-03-08 DIAGNOSIS — Z716 Tobacco abuse counseling: Secondary | ICD-10-CM | POA: Diagnosis not present

## 2018-03-08 DIAGNOSIS — R791 Abnormal coagulation profile: Secondary | ICD-10-CM | POA: Diagnosis not present

## 2018-03-08 DIAGNOSIS — Z720 Tobacco use: Secondary | ICD-10-CM | POA: Diagnosis not present

## 2018-03-08 DIAGNOSIS — W19XXXA Unspecified fall, initial encounter: Secondary | ICD-10-CM | POA: Diagnosis not present

## 2018-03-08 DIAGNOSIS — R9431 Abnormal electrocardiogram [ECG] [EKG]: Secondary | ICD-10-CM | POA: Diagnosis not present

## 2018-03-08 DIAGNOSIS — A419 Sepsis, unspecified organism: Secondary | ICD-10-CM | POA: Diagnosis not present

## 2018-03-08 DIAGNOSIS — R58 Hemorrhage, not elsewhere classified: Secondary | ICD-10-CM | POA: Diagnosis not present

## 2018-03-08 DIAGNOSIS — Z9989 Dependence on other enabling machines and devices: Secondary | ICD-10-CM | POA: Diagnosis not present

## 2018-03-08 DIAGNOSIS — M25461 Effusion, right knee: Secondary | ICD-10-CM | POA: Diagnosis not present

## 2018-03-08 DIAGNOSIS — S90415A Abrasion, left lesser toe(s), initial encounter: Secondary | ICD-10-CM | POA: Diagnosis not present

## 2018-03-08 DIAGNOSIS — L03115 Cellulitis of right lower limb: Secondary | ICD-10-CM | POA: Diagnosis not present

## 2018-03-08 DIAGNOSIS — G4733 Obstructive sleep apnea (adult) (pediatric): Secondary | ICD-10-CM | POA: Diagnosis not present

## 2018-03-08 DIAGNOSIS — D72829 Elevated white blood cell count, unspecified: Secondary | ICD-10-CM | POA: Diagnosis not present

## 2018-03-08 DIAGNOSIS — S81012A Laceration without foreign body, left knee, initial encounter: Secondary | ICD-10-CM | POA: Diagnosis not present

## 2018-03-08 DIAGNOSIS — M1990 Unspecified osteoarthritis, unspecified site: Secondary | ICD-10-CM | POA: Diagnosis not present

## 2018-03-08 DIAGNOSIS — D6851 Activated protein C resistance: Secondary | ICD-10-CM | POA: Diagnosis not present

## 2018-03-08 DIAGNOSIS — Z7901 Long term (current) use of anticoagulants: Secondary | ICD-10-CM | POA: Diagnosis not present

## 2018-03-08 DIAGNOSIS — I160 Hypertensive urgency: Secondary | ICD-10-CM | POA: Diagnosis not present

## 2018-03-08 DIAGNOSIS — E785 Hyperlipidemia, unspecified: Secondary | ICD-10-CM | POA: Diagnosis not present

## 2018-03-08 DIAGNOSIS — I10 Essential (primary) hypertension: Secondary | ICD-10-CM | POA: Diagnosis not present

## 2018-03-08 DIAGNOSIS — R739 Hyperglycemia, unspecified: Secondary | ICD-10-CM | POA: Diagnosis not present

## 2018-03-08 NOTE — Telephone Encounter (Signed)
I called pt and offered an appt for him to come in today and he declined. He declined appts this week as he said that he is dealing with personal issues and is not able to come in. Appt was made for 03/14/2018. Pt is s/p an ankle fusion and advised that he should remain non wtb and elevate higher than his heart. Trying all conservative measures until he is able to come in for his appt.

## 2018-03-09 ENCOUNTER — Ambulatory Visit (INDEPENDENT_AMBULATORY_CARE_PROVIDER_SITE_OTHER): Payer: PPO | Admitting: Orthopedic Surgery

## 2018-03-09 DIAGNOSIS — R9431 Abnormal electrocardiogram [ECG] [EKG]: Secondary | ICD-10-CM | POA: Diagnosis not present

## 2018-03-10 ENCOUNTER — Other Ambulatory Visit: Payer: Self-pay

## 2018-03-10 NOTE — Patient Outreach (Addendum)
[facility]) Care Management  03/10/2018  [name] October 26, 1960 [id number]  Transition of care Referral date: 03/10/18 Referral source: discharge from [facility] 03/09/18 Insurance: health team advantage  Telephone call to patient regarding transition of care referral. HIPAA verified with patient. Explained reason for call.  Patient states he has a follow up appointment scheduled with his orthopedic doctor  on 03/14/18.  Patient states he does not have transportation to his appointment.  Patient states, " I don't have any money." Patient states he has [facility] but he has to give a 3 day notice for transport. He states his appointment is on Tuesday 2/ 18/20.   Patient states he was in the hospital due to an infection is his right leg. Patient states he fell prior to being admitted to the hospital. He reports he has fallen approximately 2 times within the past 1 1/2 week with a total of 3 falls within the past 3 months. Patient states he uses a knee scooter, walker and/ or cane for ambulation. Patient states he sometimes just hobbles around.  Patient states he has difficulty with walking.  Patient reports having had a left ankle fusion in the past.   Patient denies being discharge with home health. Patient states he does not feel he got the best care during this hospitalization. Patient voiced his frustration regarding his care.  RNCM discussed and offered transition of care follow up with RN community case manager and social worker follow up for transportation assistance. Patient verbally agreed   ASSESSMENT: Patient would benefit from community case management follow up for transition of care and home safety evaluation due to falls.   PLAN: RNCm will refer patient to community case manager and social worker  George Ina RN,BSN,CCM [facility] Telephonic  [phone]

## 2018-03-13 ENCOUNTER — Other Ambulatory Visit: Payer: Self-pay | Admitting: *Deleted

## 2018-03-13 NOTE — Patient Outreach (Signed)
[facility]) Care Management  03/13/2018  [name] 03-19-1960 [id number]   CSW received an urgent consult to assist pt with post hospital transportation needs. CSW spoke with pt by phone and confirmed his identity. Pt remembers CSW from previous assistance provided. Pt reports he has been able to get his local friend to transport him tomorrow to the Ortho MD appointment. Pt denies any other needs that CSW can assist with at this time. Pt is linked with [facility] for transportation services as well.  CSW will close referral and advise PCP and [facility] team.  CSW encouraged pt to reach out if further needs arise.   [name], MSW, LCSW Clinical Social Worker  [facility] [phone]

## 2018-03-14 ENCOUNTER — Emergency Department (HOSPITAL_COMMUNITY): Payer: PPO

## 2018-03-14 ENCOUNTER — Encounter (HOSPITAL_COMMUNITY): Payer: Self-pay | Admitting: Emergency Medicine

## 2018-03-14 ENCOUNTER — Inpatient Hospital Stay (HOSPITAL_COMMUNITY)
Admission: EM | Admit: 2018-03-14 | Discharge: 2018-03-21 | DRG: 580 | Disposition: A | Discharge status: Home Health | Payer: PPO | Attending: Internal Medicine | Admitting: Internal Medicine

## 2018-03-14 ENCOUNTER — Ambulatory Visit (INDEPENDENT_AMBULATORY_CARE_PROVIDER_SITE_OTHER): Payer: PPO

## 2018-03-14 ENCOUNTER — Ambulatory Visit (INDEPENDENT_AMBULATORY_CARE_PROVIDER_SITE_OTHER): Payer: PPO | Admitting: Orthopedic Surgery

## 2018-03-14 ENCOUNTER — Encounter (INDEPENDENT_AMBULATORY_CARE_PROVIDER_SITE_OTHER): Payer: Self-pay | Admitting: Orthopedic Surgery

## 2018-03-14 VITALS — Ht 71.0 in | Wt 337.0 lb

## 2018-03-14 DIAGNOSIS — Z79899 Other long term (current) drug therapy: Secondary | ICD-10-CM

## 2018-03-14 DIAGNOSIS — L02415 Cutaneous abscess of right lower limb: Secondary | ICD-10-CM | POA: Diagnosis not present

## 2018-03-14 DIAGNOSIS — S81011A Laceration without foreign body, right knee, initial encounter: Secondary | ICD-10-CM | POA: Diagnosis present

## 2018-03-14 DIAGNOSIS — F329 Major depressive disorder, single episode, unspecified: Secondary | ICD-10-CM | POA: Diagnosis not present

## 2018-03-14 DIAGNOSIS — D6851 Activated protein C resistance: Secondary | ICD-10-CM | POA: Diagnosis present

## 2018-03-14 DIAGNOSIS — L0390 Cellulitis, unspecified: Secondary | ICD-10-CM | POA: Diagnosis present

## 2018-03-14 DIAGNOSIS — S81001S Unspecified open wound, right knee, sequela: Secondary | ICD-10-CM | POA: Diagnosis not present

## 2018-03-14 DIAGNOSIS — Q431 Hirschsprung's disease: Secondary | ICD-10-CM

## 2018-03-14 DIAGNOSIS — F17210 Nicotine dependence, cigarettes, uncomplicated: Secondary | ICD-10-CM | POA: Diagnosis not present

## 2018-03-14 DIAGNOSIS — S91001S Unspecified open wound, right ankle, sequela: Secondary | ICD-10-CM | POA: Diagnosis not present

## 2018-03-14 DIAGNOSIS — Z981 Arthrodesis status: Secondary | ICD-10-CM

## 2018-03-14 DIAGNOSIS — Z8614 Personal history of Methicillin resistant Staphylococcus aureus infection: Secondary | ICD-10-CM | POA: Diagnosis not present

## 2018-03-14 DIAGNOSIS — L03115 Cellulitis of right lower limb: Secondary | ICD-10-CM | POA: Diagnosis not present

## 2018-03-14 DIAGNOSIS — Z86718 Personal history of other venous thrombosis and embolism: Secondary | ICD-10-CM

## 2018-03-14 DIAGNOSIS — Z9119 Patient's noncompliance with other medical treatment and regimen: Secondary | ICD-10-CM

## 2018-03-14 DIAGNOSIS — M19072 Primary osteoarthritis, left ankle and foot: Secondary | ICD-10-CM | POA: Diagnosis present

## 2018-03-14 DIAGNOSIS — W1789XA Other fall from one level to another, initial encounter: Secondary | ICD-10-CM | POA: Diagnosis present

## 2018-03-14 DIAGNOSIS — L089 Local infection of the skin and subcutaneous tissue, unspecified: Secondary | ICD-10-CM | POA: Diagnosis not present

## 2018-03-14 DIAGNOSIS — Z7901 Long term (current) use of anticoagulants: Secondary | ICD-10-CM

## 2018-03-14 DIAGNOSIS — I10 Essential (primary) hypertension: Secondary | ICD-10-CM | POA: Diagnosis present

## 2018-03-14 DIAGNOSIS — Z6841 Body Mass Index (BMI) 40.0 and over, adult: Secondary | ICD-10-CM

## 2018-03-14 DIAGNOSIS — S81801S Unspecified open wound, right lower leg, sequela: Secondary | ICD-10-CM

## 2018-03-14 DIAGNOSIS — M25461 Effusion, right knee: Secondary | ICD-10-CM | POA: Diagnosis not present

## 2018-03-14 DIAGNOSIS — S80211A Abrasion, right knee, initial encounter: Secondary | ICD-10-CM | POA: Diagnosis not present

## 2018-03-14 DIAGNOSIS — G4733 Obstructive sleep apnea (adult) (pediatric): Secondary | ICD-10-CM | POA: Diagnosis not present

## 2018-03-14 DIAGNOSIS — Z86711 Personal history of pulmonary embolism: Secondary | ICD-10-CM | POA: Diagnosis not present

## 2018-03-14 DIAGNOSIS — T148XXA Other injury of unspecified body region, initial encounter: Secondary | ICD-10-CM | POA: Diagnosis not present

## 2018-03-14 DIAGNOSIS — F419 Anxiety disorder, unspecified: Secondary | ICD-10-CM | POA: Diagnosis present

## 2018-03-14 DIAGNOSIS — Z8249 Family history of ischemic heart disease and other diseases of the circulatory system: Secondary | ICD-10-CM | POA: Diagnosis not present

## 2018-03-14 DIAGNOSIS — Z96651 Presence of right artificial knee joint: Secondary | ICD-10-CM | POA: Diagnosis not present

## 2018-03-14 DIAGNOSIS — S86801S Unspecified injury of other muscle(s) and tendon(s) at lower leg level, right leg, sequela: Secondary | ICD-10-CM

## 2018-03-14 DIAGNOSIS — L0889 Other specified local infections of the skin and subcutaneous tissue: Secondary | ICD-10-CM | POA: Diagnosis not present

## 2018-03-14 DIAGNOSIS — M79672 Pain in left foot: Secondary | ICD-10-CM

## 2018-03-14 DIAGNOSIS — Z79891 Long term (current) use of opiate analgesic: Secondary | ICD-10-CM

## 2018-03-14 LAB — COMPREHENSIVE METABOLIC PANEL
ALBUMIN: 3 g/dL — AB (ref 3.5–5.0)
ALT: 55 U/L — ABNORMAL HIGH (ref 0–44)
ANION GAP: 9 (ref 5–15)
AST: 84 U/L — ABNORMAL HIGH (ref 15–41)
Alkaline Phosphatase: 62 U/L (ref 38–126)
BILIRUBIN TOTAL: 0.6 mg/dL (ref 0.3–1.2)
BUN: <5 mg/dL — ABNORMAL LOW (ref 6–20)
CO2: 24 mmol/L (ref 22–32)
Calcium: 9.3 mg/dL (ref 8.9–10.3)
Chloride: 104 mmol/L (ref 98–111)
Creatinine, Ser: 0.81 mg/dL (ref 0.61–1.24)
GFR calc Af Amer: >60 mL/min (ref >60)
GFR calc non Af Amer: >60 mL/min (ref >60)
GLUCOSE: 88 mg/dL (ref 70–99)
POTASSIUM: 4 mmol/L (ref 3.5–5.1)
SODIUM: 137 mmol/L (ref 135–145)
TOTAL PROTEIN: 7.1 g/dL (ref 6.5–8.1)

## 2018-03-14 LAB — CBC WITH DIFFERENTIAL/PLATELET
ABS IMMATURE GRANULOCYTES: 0.15 10*3/uL — AB (ref 0.00–0.07)
Basophils Absolute: 0 10*3/uL (ref 0.0–0.1)
Basophils Relative: 0 %
Eosinophils Absolute: 0.2 10*3/uL (ref 0.0–0.5)
Eosinophils Relative: 2 %
HCT: 42.2 % (ref 39.0–52.0)
Hemoglobin: 13.1 g/dL (ref 13.0–17.0)
IMMATURE GRANULOCYTES: 2 %
Lymphocytes Relative: 23 %
Lymphs Abs: 2.3 10*3/uL (ref 0.7–4.0)
MCH: 29.4 pg (ref 26.0–34.0)
MCHC: 31 g/dL (ref 30.0–36.0)
MCV: 94.8 fL (ref 80.0–100.0)
MONOS PCT: 8 %
Monocytes Absolute: 0.8 10*3/uL (ref 0.1–1.0)
NEUTROS ABS: 6.4 10*3/uL (ref 1.7–7.7)
NEUTROS PCT: 65 %
PLATELETS: 331 10*3/uL (ref 150–400)
RBC: 4.45 MIL/uL (ref 4.22–5.81)
RDW: 13.7 % (ref 11.5–15.5)
WBC: 9.8 10*3/uL (ref 4.0–10.5)
nRBC: 0.2 % (ref 0.0–0.2)

## 2018-03-14 LAB — LACTIC ACID, PLASMA
LACTIC ACID, VENOUS: 1 mmol/L (ref 0.5–1.9)
LACTIC ACID, VENOUS: 1.2 mmol/L (ref 0.5–1.9)

## 2018-03-14 LAB — PROTIME-INR
INR: 2.82
PROTHROMBIN TIME: 29.3 s — AB (ref 11.4–15.2)

## 2018-03-14 MED ORDER — CEFTRIAXONE SODIUM 2 G IJ SOLR
2.0000 g | INTRAMUSCULAR | Status: DC
Start: 2018-03-14 — End: 2018-03-19
  Administered 2018-03-14 – 2018-03-18 (×5): 2 g via INTRAVENOUS
  Filled 2018-03-14 (×5): qty 20

## 2018-03-14 MED ORDER — VANCOMYCIN HCL 10 G IV SOLR
1500.0000 mg | Freq: Two times a day (BID) | INTRAVENOUS | Status: DC
  Administered 2018-03-14 – 2018-03-21 (×14): 1500 mg via INTRAVENOUS
  Filled 2018-03-14 (×16): qty 1500

## 2018-03-14 MED ORDER — SODIUM CHLORIDE 0.9% FLUSH
3.0000 mL | Freq: Once | INTRAVENOUS | Status: AC
  Administered 2018-03-14: 3 mL via INTRAVENOUS

## 2018-03-14 MED ORDER — VANCOMYCIN HCL IN DEXTROSE 1-5 GM/200ML-% IV SOLN: 1000.0000 mg | Freq: Once | INTRAVENOUS | Status: DC

## 2018-03-14 NOTE — ED Triage Notes (Addendum)
Pt was recently d/c'd from [place] [facility] after a fall, here today for right knee infection. Right knee is dark red and hot.

## 2018-03-14 NOTE — ED Notes (Signed)
1st attempt to call report to 5n, per secretary staffing issues at 112 and will have to call back once pt reassigned to correct RN/room.

## 2018-03-14 NOTE — ED Provider Notes (Signed)
[facility] EMERGENCY DEPARTMENT Provider Note   CSN: [id number] Arrival date & time: 03/14/18  1338    History   Chief Complaint Chief Complaint  Patient presents with  . Wound Infection  . Leg Pain    HPI [name] is a 58 y.o. male.     Patient s/p fall 1 week ago onto right knee - states slipped off his knee scooter (s/p left ankle/foot fusion surgery). Went to [facility] then with contusion/abrasion to right knee. Was placed on po abx which he took, last dose was earlier today. In past few days notes progressively worsening redness and swelling about right knee and lower leg, with purulent drainage from wound. Symptoms moderate, persistent, slowly worsening. Denies fever or chills. Indicates tetanus up to date.   The history is provided by the patient.  Leg Pain  Associated symptoms: no back pain, no fever and no neck pain     Past Medical History:  Diagnosis Date  . Anxiety   . Depression   . DVT (deep venous thrombosis) (HCC)    right clavicle and LLE  . Factor V Leiden (HCC)   . Fractures   . Frontal lobe deficit   . Hirschsprung's disease   . Hypertension   . Impingement syndrome of left ankle   . MRSA infection    left hip   . OA (osteoarthritis)    left ankle  . Sleep apnea    wears CPAP    Patient Active Problem List   Diagnosis Date Noted  . S/P ankle fusion 04/20/2017  . Post-traumatic osteoarthritis, left ankle and foot   . 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 Impingement of left ankle joint 07/14/2016  . Ankle impingement syndrome, left   . History of total right knee replacement 03/09/2016  . Pain in left ankle and joints of left foot 03/09/2016    Past Surgical History:  Procedure Laterality Date  . ANKLE ARTHROSCOPY Left 07/14/2016   Procedure: LEFT ANKLE ARTHROSCOPY AND DEBRIDEMENT;  Surgeon: Nadara Mustard, MD;  Location: [facility] OR;  Service: Orthopedics;  Laterality: Left;  . ANKLE FUSION Left  04/20/2017   Procedure: LEFT TIBIOCALCANEAL FUSION;  Surgeon: Nadara Mustard, MD;  Location: [facility] OR;  Service: Orthopedics;  Laterality: Left;  . COLON SURGERY     Mega colon  . COLONOSCOPY    . HIP SURGERY Left    At [facility]  . KNEE ARTHROSCOPY    . LEFT ANKLE SCOPE WITH DEBRIDMENT  Left 07/14/2016        Home Medications    Prior to Admission medications   Medication Sig Start Date End Date Taking? Authorizing Provider  atorvastatin (LIPITOR) 20 MG tablet Take 20 mg by mouth daily.  02/17/16   [provider]  FLUoxetine (PROZAC) 40 MG capsule Take 80 mg by mouth daily.  02/16/16   [provider]  gabapentin (NEURONTIN) 400 MG capsule Take 800-1,200 mg by mouth See admin instructions. Take 800 mg by mouth in the morning and take 1200 mg by mouth at bedtime 02/17/16   [provider]  oxyCODONE-acetaminophen (PERCOCET) 10-325 MG tablet Take 1 tablet by mouth every 4 (four) hours as needed for pain. 04/22/17   Nadara Mustard, MD  oxyCODONE-acetaminophen (PERCOCET) 10-325 MG tablet Take 1 tablet by mouth every 4 (four) hours as needed for pain. 05/05/17   Nadara Mustard, MD  traZODone (DESYREL) 100 MG tablet Take 200 mg by mouth at  bedtime.  03/01/16   [provider]  Venlafaxine HCl 225 MG TB24 Take 1 tablet by mouth daily. 12/09/17   [provider]  warfarin (COUMADIN) 5 MG tablet Take 5-7.5 mg by mouth See admin instructions. Take 7.5 mg by mouth daily for 2 days, then take 5 mg by mouth daily for 1 day, then repeat 02/16/16   [provider]    Family History Family History  Problem Relation Age of Onset  . Heart attack Father     Social History Social History   Tobacco Use  . Smoking status: Current Every Day Smoker    Packs/day: 0.50    Years: 20.00    Pack years: 10.00    Types: Cigarettes  . Smokeless tobacco: Never Used  Substance Use Topics  . Alcohol use: Not Currently  . Drug use: Yes    Types: Marijuana     Comment: hasn't used any in 1 mth     Allergies   Patient has no known allergies.   Review of Systems Review of Systems  Constitutional: Negative for fever.  HENT: Negative for sore throat.   Eyes: Negative for redness.  Respiratory: Negative for shortness of breath.   Cardiovascular: Negative for chest pain.  Gastrointestinal: Negative for abdominal pain and vomiting.  Genitourinary: Negative for flank pain.  Musculoskeletal: Negative for back pain and neck pain.  Skin: Positive for wound.  Neurological: Negative for numbness and headaches.  Hematological: Does not bruise/bleed easily.  Psychiatric/Behavioral: Negative for confusion.     Physical Exam Updated Vital Signs BP 100/70 (BP Location: Left Arm)   Pulse 90   Temp 98.8 F (37.1 C) (Oral)   Resp 16   SpO2 94%   Physical Exam Vitals signs and nursing note reviewed.  Constitutional:      Appearance: Normal appearance. He is well-developed.  HENT:     Head: Atraumatic.     Nose: Nose normal.     Mouth/Throat:     Mouth: Mucous membranes are moist.     Pharynx: Oropharynx is clear.  Eyes:     General: No scleral icterus.    Conjunctiva/sclera: Conjunctivae normal.     Pupils: Pupils are equal, round, and reactive to light.  Neck:     Musculoskeletal: Normal range of motion and neck supple. No neck rigidity.     Trachea: No tracheal deviation.  Cardiovascular:     Rate and Rhythm: Normal rate and regular rhythm.     Pulses: Normal pulses.     Heart sounds: Normal heart sounds. No murmur. No friction rub. No gallop.   Pulmonary:     Effort: Pulmonary effort is normal. No accessory muscle usage or respiratory distress.     Breath sounds: Normal breath sounds.  Abdominal:     General: Bowel sounds are normal. There is no distension.     Palpations: Abdomen is soft.     Tenderness: There is no abdominal tenderness. There is no guarding.  Genitourinary:    Comments: No cva tenderness. Musculoskeletal:          General: No swelling.     Comments: Right knee with large abrasion anteriorly with purulent drainage. Erythema extending from abraded area, with moderate LLE swelling. Distal pulses palp. Remote right TKA scar. With slow/passive rom right knee no severe knee pain.   Skin:    General: Skin is warm and dry.     Findings: No rash.  Neurological:     Mental Status: He  is alert.     Comments: Alert, speech clear. Motor/sens grossly intact bil.   Psychiatric:        Mood and Affect: Mood normal.      ED Treatments / Results  Labs (all labs ordered are listed, but only abnormal results are displayed) Results for orders placed or performed during the hospital encounter of 03/14/18  Lactic acid, plasma  Result Value Ref Range   Lactic Acid, Venous 1.0 0.5 - 1.9 mmol/L  Lactic acid, plasma  Result Value Ref Range   Lactic Acid, Venous 1.2 0.5 - 1.9 mmol/L  Comprehensive metabolic panel  Result Value Ref Range   Sodium 137 135 - 145 mmol/L   Potassium 4.0 3.5 - 5.1 mmol/L   Chloride 104 98 - 111 mmol/L   CO2 24 22 - 32 mmol/L   Glucose, Bld 88 70 - 99 mg/dL   BUN <5 (L) 6 - 20 mg/dL   Creatinine, Ser 2.54 0.61 - 1.24 mg/dL   Calcium 9.3 8.9 - 27.0 mg/dL   Total Protein 7.1 6.5 - 8.1 g/dL   Albumin 3.0 (L) 3.5 - 5.0 g/dL   AST 84 (H) 15 - 41 U/L   ALT 55 (H) 0 - 44 U/L   Alkaline Phosphatase 62 38 - 126 U/L   Total Bilirubin 0.6 0.3 - 1.2 mg/dL   GFR calc non Af Amer >60 >60 mL/min   GFR calc Af Amer >60 >60 mL/min   Anion gap 9 5 - 15  CBC with Differential  Result Value Ref Range   WBC 9.8 4.0 - 10.5 K/uL   RBC 4.45 4.22 - 5.81 MIL/uL   Hemoglobin 13.1 13.0 - 17.0 g/dL   HCT 62.3 76.2 - 83.1 %   MCV 94.8 80.0 - 100.0 fL   MCH 29.4 26.0 - 34.0 pg   MCHC 31.0 30.0 - 36.0 g/dL   RDW 51.7 61.6 - 07.3 %   Platelets 331 150 - 400 K/uL   nRBC 0.2 0.0 - 0.2 %   Neutrophils Relative % 65 %   Neutro Abs 6.4 1.7 - 7.7 K/uL   Lymphocytes Relative 23 %   Lymphs Abs 2.3 0.7 -  4.0 K/uL   Monocytes Relative 8 %   Monocytes Absolute 0.8 0.1 - 1.0 K/uL   Eosinophils Relative 2 %   Eosinophils Absolute 0.2 0.0 - 0.5 K/uL   Basophils Relative 0 %   Basophils Absolute 0.0 0.0 - 0.1 K/uL   Immature Granulocytes 2 %   Abs Immature Granulocytes 0.15 (H) 0.00 - 0.07 K/uL  Protime-INR  Result Value Ref Range   Prothrombin Time 29.3 (H) 11.4 - 15.2 seconds   INR 2.82     EKG None  Radiology Dg Knee Complete 4 Views Right  Result Date: 03/14/2018 CLINICAL DATA:  Pain after fall. Abrasion. EXAM: RIGHT KNEE - COMPLETE 4+ VIEW COMPARISON:  None. FINDINGS: Soft tissue laceration associated with soft tissue swelling is identified anterior to the expected course of the patellar tendon. Total knee arthroplasty with intact hardware. No loosening or periprosthetic fracture. No bone destruction or joint effusion. IMPRESSION: Soft tissue laceration anterior to the expected course of the patellar tendon. Intact total knee arthroplasty. No acute osseous abnormality. Electronically Signed   By: Tollie Eth M.D.   On: 03/14/2018 21:43    Procedures Procedures (including critical care time)  Medications Ordered in ED Medications  sodium chloride flush (NS) 0.9 % injection 3 mL (has no administration in time range)  vancomycin (VANCOCIN) IVPB 1000 mg/200 mL premix (has no administration in time range)  cefTRIAXone (ROCEPHIN) 2 g in sodium chloride 0.9 % 100 mL IVPB (has no administration in time range)     Initial Impression / Assessment and Plan / ED Course  I have reviewed the triage vital signs and the nursing notes.  Pertinent labs & imaging results that were available during my care of the patient were reviewed by me and considered in my medical decision making (see chart for details).  Labs and cultures sent.   Iv ns.  Xrays.   Labs reviewed - chem normal.  Given worsening of cellulitis despite po abx as outpt, will admit and tx with iv abx.   Zosyn and vanc iv.     Medical service consulted for admission.  Discussed pt with Dr Toniann FailKakrakandy - will admit.   Given lower leg sts/cellulitis, and degree swelling, may benefit from vascular u/s in AM to r/o dvt. There is no crepitus to area. Compartment of leg are soft, not tense. Distal pulses are palp. Pain is controlled. No numbness.     Final Clinical Impressions(s) / ED Diagnoses   Final diagnoses:  None    ED Discharge Orders    None       Cathren LaineSteinl, Damone Fancher, MD 03/14/18 2224

## 2018-03-14 NOTE — Progress Notes (Signed)
Pharmacy Antibiotic Note  [name] is a 58 y.o. male admitted on 03/14/2018 with cellulitis.  Pharmacy has been consulted for vancomycin dosing.  Ceftriaxone per MD.  AF, WBC wnl, VSS.  SCr 0.81  Plan: Vancomycin 1500mg  IV every 12 hours Monitor renal function, Cx and clinical progression to narrow Vancomycin level at steady state    Temp (24hrs), Avg:98.8 F (37.1 C), Min:98.8 F (37.1 C), Max:98.8 F (37.1 C)  Recent Labs  Lab 03/14/18 1357 03/14/18 1358  WBC  --  9.8  CREATININE  --  0.81  LATICACIDVEN 1.0  --     Estimated Creatinine Clearance: 153.1 mL/min (by C-G formula based on SCr of 0.81 mg/dL).    No Known Allergies  Antimicrobials this admission: Vanc 2/18>> Ceftriaxone 2/18>>  Dose adjustments this admission: n/a  Microbiology results: 2/18 BCx: sent  Daylene Posey, PharmD Clinical Pharmacist Please check AMION for all [facility] Pharmacy numbers 03/14/2018 8:40 PM

## 2018-03-15 ENCOUNTER — Encounter (HOSPITAL_COMMUNITY): Payer: Self-pay | Admitting: Internal Medicine

## 2018-03-15 ENCOUNTER — Inpatient Hospital Stay (HOSPITAL_COMMUNITY): Payer: PPO

## 2018-03-15 ENCOUNTER — Ambulatory Visit: Payer: Self-pay

## 2018-03-15 DIAGNOSIS — L03115 Cellulitis of right lower limb: Secondary | ICD-10-CM

## 2018-03-15 DIAGNOSIS — Z86711 Personal history of pulmonary embolism: Secondary | ICD-10-CM | POA: Diagnosis present

## 2018-03-15 DIAGNOSIS — T148XXA Other injury of unspecified body region, initial encounter: Secondary | ICD-10-CM

## 2018-03-15 DIAGNOSIS — L089 Local infection of the skin and subcutaneous tissue, unspecified: Secondary | ICD-10-CM | POA: Insufficient documentation

## 2018-03-15 LAB — CBC
HCT: 39.6 % (ref 39.0–52.0)
Hemoglobin: 12.8 g/dL — ABNORMAL LOW (ref 13.0–17.0)
MCH: 30 pg (ref 26.0–34.0)
MCHC: 32.3 g/dL (ref 30.0–36.0)
MCV: 92.7 fL (ref 80.0–100.0)
PLATELETS: 313 10*3/uL (ref 150–400)
RBC: 4.27 MIL/uL (ref 4.22–5.81)
RDW: 13.8 % (ref 11.5–15.5)
WBC: 8.2 10*3/uL (ref 4.0–10.5)
nRBC: 0.2 % (ref 0.0–0.2)

## 2018-03-15 LAB — BASIC METABOLIC PANEL
Anion gap: 9 (ref 5–15)
BUN: 6 mg/dL (ref 6–20)
CO2: 27 mmol/L (ref 22–32)
Calcium: 8.9 mg/dL (ref 8.9–10.3)
Chloride: 103 mmol/L (ref 98–111)
Creatinine, Ser: 0.88 mg/dL (ref 0.61–1.24)
GFR calc Af Amer: >60 mL/min (ref >60)
GFR calc non Af Amer: >60 mL/min (ref >60)
Glucose, Bld: 94 mg/dL (ref 70–99)
POTASSIUM: 4.1 mmol/L (ref 3.5–5.1)
Sodium: 139 mmol/L (ref 135–145)

## 2018-03-15 LAB — HIV ANTIBODY (ROUTINE TESTING W REFLEX): HIV Screen 4th Generation wRfx: NONREACTIVE

## 2018-03-15 LAB — PROTIME-INR
INR: 2.89
Prothrombin Time: 29.9 seconds — ABNORMAL HIGH (ref 11.4–15.2)

## 2018-03-15 MED ORDER — ATORVASTATIN CALCIUM 10 MG PO TABS
20.0000 mg | ORAL_TABLET | Freq: Every day | ORAL | Status: DC
  Administered 2018-03-15 – 2018-03-20 (×6): 20 mg via ORAL
  Filled 2018-03-15 (×6): qty 2

## 2018-03-15 MED ORDER — ACETAMINOPHEN 325 MG PO TABS: 650.0000 mg | ORAL_TABLET | Freq: Four times a day (QID) | ORAL | Status: DC | PRN

## 2018-03-15 MED ORDER — GABAPENTIN 600 MG PO TABS
1200.0000 mg | ORAL_TABLET | Freq: Every day | ORAL | Status: DC
  Administered 2018-03-15 – 2018-03-20 (×6): 1200 mg via ORAL
  Filled 2018-03-15 (×6): qty 2

## 2018-03-15 MED ORDER — ONDANSETRON HCL 4 MG/2ML IJ SOLN: 4.0000 mg | Freq: Four times a day (QID) | INTRAMUSCULAR | Status: DC | PRN

## 2018-03-15 MED ORDER — VENLAFAXINE HCL ER 225 MG PO TB24: 225.0000 mg | ORAL_TABLET | Freq: Every day | ORAL | Status: DC

## 2018-03-15 MED ORDER — GABAPENTIN 400 MG PO CAPS: 800.0000 mg | ORAL_CAPSULE | ORAL | Status: DC

## 2018-03-15 MED ORDER — ACETAMINOPHEN 650 MG RE SUPP: 650.0000 mg | Freq: Four times a day (QID) | RECTAL | Status: DC | PRN

## 2018-03-15 MED ORDER — IOHEXOL 300 MG/ML  SOLN
100.0000 mL | Freq: Once | INTRAMUSCULAR | Status: AC | PRN
  Administered 2018-03-15: 100 mL via INTRAVENOUS

## 2018-03-15 MED ORDER — ONDANSETRON HCL 4 MG PO TABS: 4.0000 mg | ORAL_TABLET | Freq: Four times a day (QID) | ORAL | Status: DC | PRN

## 2018-03-15 MED ORDER — POLYETHYLENE GLYCOL 3350 17 G PO PACK: 17.0000 g | PACK | Freq: Every day | ORAL | Status: DC | PRN

## 2018-03-15 MED ORDER — GABAPENTIN 400 MG PO CAPS
800.0000 mg | ORAL_CAPSULE | Freq: Every day | ORAL | Status: DC
  Administered 2018-03-15 – 2018-03-21 (×7): 800 mg via ORAL
  Filled 2018-03-15 (×7): qty 2

## 2018-03-15 MED ORDER — HYDROCODONE-ACETAMINOPHEN 5-325 MG PO TABS
1.0000 | ORAL_TABLET | ORAL | Status: DC | PRN
  Administered 2018-03-15 – 2018-03-18 (×8): 1 via ORAL
  Filled 2018-03-15 (×8): qty 1

## 2018-03-15 MED ORDER — TRAZODONE HCL 100 MG PO TABS
200.0000 mg | ORAL_TABLET | Freq: Every day | ORAL | Status: DC
  Administered 2018-03-15 – 2018-03-20 (×6): 200 mg via ORAL
  Filled 2018-03-15 (×7): qty 2

## 2018-03-15 MED ORDER — FLUOXETINE HCL 20 MG PO CAPS
80.0000 mg | ORAL_CAPSULE | Freq: Every day | ORAL | Status: DC
  Administered 2018-03-15 – 2018-03-21 (×7): 80 mg via ORAL
  Filled 2018-03-15 (×7): qty 4

## 2018-03-15 MED ORDER — VENLAFAXINE HCL ER 75 MG PO CP24
225.0000 mg | ORAL_CAPSULE | Freq: Every day | ORAL | Status: DC
  Administered 2018-03-15 – 2018-03-21 (×7): 225 mg via ORAL
  Filled 2018-03-15 (×7): qty 1

## 2018-03-15 NOTE — Consult Note (Signed)
WOC consult requested for right knee.  Reviewed photos and progress notes in the EMR.  Pt has exposed tendons to a full thickness wound. H and P indicates "Dr. Lajoyce Corners patient's orthopedic surgeon to give further recommendations.".  Unable to view the CT from [place]. Without this information, it's difficult to gauge the involvement of knee hardware in this wound.  Recommend CT or MRI follow-up. Orders for moist gauze dressing provided until further input is available from the ortho team.  Please re-consult if further assistance is needed.  Laray Anger RN [address] MSN, RN, CWOCN, [place], CNS [phone]

## 2018-03-15 NOTE — Plan of Care (Signed)
  Problem: Safety: Goal: Ability to remain free from injury will improve Outcome: Progressing   

## 2018-03-15 NOTE — H&P (Signed)
History and Physical    [name]:[id number] DOB: 30-Nov-1960 DOA: 03/14/2018  PCP: Simone Curia, MD  Patient coming from: Home.  Chief Complaint: Nonhealing wound of the right knee.  HPI: [name] is a 58 y.o. male with history of factor V Leyden deficiency with history of DVT and pulmonary embolism on Coumadin, sleep apnea, depression had a fall last week while using his knee scooter.  Patient got hurt on his knee in the process.  Had gone to [facility] and as per the patient had CAT scans done and per patient was not showing anything acute.  Since then patient has been seen increasing discharge from the wound on the right knee and had followed up with his orthopedic surgeon Dr. Lajoyce Corners yesterday.  Orthopedic surgeon referred patient to the ER for admission.  ED Course: In the ER x-rays reveal laceration of soft tissue anterior to the patellar tendon of the right knee.  No involvement of the right knee.  Given the discharge and ongoing nonhealing wound patient was started on antibiotics and admitted for further management.  Review of Systems: As per HPI, rest all negative.   Past Medical History:  Diagnosis Date  . Anxiety   . Depression   . DVT (deep venous thrombosis) (HCC)    right clavicle and LLE  . Factor V Leiden (HCC)   . Fractures   . Frontal lobe deficit   . Hirschsprung's disease   . Hypertension   . Impingement syndrome of left ankle   . MRSA infection    left hip   . OA (osteoarthritis)    left ankle  . Sleep apnea    wears CPAP    Past Surgical History:  Procedure Laterality Date  . ANKLE ARTHROSCOPY Left 07/14/2016   Procedure: LEFT ANKLE ARTHROSCOPY AND DEBRIDEMENT;  Surgeon: Nadara Mustard, MD;  Location: [facility] OR;  Service: Orthopedics;  Laterality: Left;  . ANKLE FUSION Left 04/20/2017   Procedure: LEFT TIBIOCALCANEAL FUSION;  Surgeon: Nadara Mustard, MD;  Location: [facility] OR;  Service: Orthopedics;  Laterality: Left;  . COLON SURGERY     Mega  colon  . COLONOSCOPY    . HIP SURGERY Left    At [facility]  . KNEE ARTHROSCOPY    . LEFT ANKLE SCOPE WITH DEBRIDMENT  Left 07/14/2016     reports that he has been smoking cigarettes. He has a 10.00 pack-year smoking history. He has never used smokeless tobacco. He reports previous alcohol use. He reports current drug use. Drug: Marijuana.  No Known Allergies  Family History  Problem Relation Age of Onset  . Heart attack Father     Prior to Admission medications   Medication Sig Start Date End Date Taking? Authorizing Provider  atorvastatin (LIPITOR) 20 MG tablet Take 20 mg by mouth daily.  02/17/16  Yes [provider]  clindamycin (CLEOCIN) 300 MG capsule Take 300 mg by mouth 3 (three) times daily. 03/09/18  Yes [provider]  FLUoxetine (PROZAC) 40 MG capsule Take 80 mg by mouth daily.  02/16/16  Yes [provider]  gabapentin (NEURONTIN) 400 MG capsule Take 800-1,200 mg by mouth See admin instructions. Take 800 mg by mouth in the morning and take 1200 mg by mouth at bedtime 02/17/16  Yes [provider]  traZODone (DESYREL) 100 MG tablet Take 200 mg by mouth at bedtime.  03/01/16  Yes [provider]  Venlafaxine HCl 225 MG TB24 Take 225 mg by mouth  daily.  12/09/17  Yes [provider]  warfarin (COUMADIN) 5 MG tablet Take 5-7.5 mg by mouth See admin instructions. Alternate taking 1 tablet for 1 day then 1 and 1/2 tablets the next day 02/16/16  Yes [provider]  oxyCODONE-acetaminophen (PERCOCET) 10-325 MG tablet Take 1 tablet by mouth every 4 (four) hours as needed for pain. Patient not taking: Reported on 03/14/2018 04/22/17   Nadara Mustarduda, Marcus V, MD  oxyCODONE-acetaminophen (PERCOCET) 10-325 MG tablet Take 1 tablet by mouth every 4 (four) hours as needed for pain. Patient not taking: Reported on 03/14/2018 05/05/17   Nadara Mustarduda, Marcus V, MD    Physical Exam: Vitals:   03/14/18 2245 03/14/18 2347 03/15/18 0329 03/15/18 0333  BP:  133/77 117/74 (!) 91/48 109/64  Pulse:  79 73 73  Resp:  18 18   Temp:  98.2 F (36.8 C) 98.4 F (36.9 C)   TempSrc:  Oral Oral   SpO2:  96% 95%       Constitutional: Moderately built and nourished. Vitals:   03/14/18 2245 03/14/18 2347 03/15/18 0329 03/15/18 0333  BP: 133/77 117/74 (!) 91/48 109/64  Pulse:  79 73 73  Resp:  18 18   Temp:  98.2 F (36.8 C) 98.4 F (36.9 C)   TempSrc:  Oral Oral   SpO2:  96% 95%    Eyes: Anicteric no pallor. ENMT: No discharge from the ears eyes nose and mouth. Neck: No mass felt.  No neck rigidity. Respiratory: No rhonchi or crepitations. Cardiovascular: S1-S2 heard. Abdomen: Soft nontender bowel sounds present. Musculoskeletal: Right knee has been having dressing done. Skin: Right knee dressing done. Neurologic: Alert awake oriented to time place and person.  Moves all extremities. Psychiatric: Appears normal per normal affect.   Labs on Admission: I have personally reviewed following labs and imaging studies  CBC: Recent Labs  Lab 03/14/18 1358  WBC 9.8  NEUTROABS 6.4  HGB 13.1  HCT 42.2  MCV 94.8  PLT 331   Basic Metabolic Panel: Recent Labs  Lab 03/14/18 1358  NA 137  K 4.0  CL 104  CO2 24  GLUCOSE 88  BUN <5*  CREATININE 0.81  CALCIUM 9.3   GFR: Estimated Creatinine Clearance: 153.1 mL/min (by C-G formula based on SCr of 0.81 mg/dL). Liver Function Tests: Recent Labs  Lab 03/14/18 1358  AST 84*  ALT 55*  ALKPHOS 62  BILITOT 0.6  PROT 7.1  ALBUMIN 3.0*   No results for input(s): LIPASE, AMYLASE in the last 168 hours. No results for input(s): AMMONIA in the last 168 hours. Coagulation Profile: Recent Labs  Lab 03/14/18 2100  INR 2.82   Cardiac Enzymes: No results for input(s): CKTOTAL, CKMB, CKMBINDEX, TROPONINI in the last 168 hours. BNP (last 3 results) No results for input(s): PROBNP in the last 8760 hours. HbA1C: No results for input(s): HGBA1C in the last 72 hours. CBG: No results for  input(s): GLUCAP in the last 168 hours. Lipid Profile: No results for input(s): CHOL, HDL, LDLCALC, TRIG, CHOLHDL, LDLDIRECT in the last 72 hours. Thyroid Function Tests: No results for input(s): TSH, T4TOTAL, FREET4, T3FREE, THYROIDAB in the last 72 hours. Anemia Panel: No results for input(s): VITAMINB12, FOLATE, FERRITIN, TIBC, IRON, RETICCTPCT in the last 72 hours. Urine analysis:    Component Value Date/Time   COLORURINE YELLOW 07/25/2007 0940   APPEARANCEUR CLEAR 07/25/2007 0940   LABSPEC 1.014 07/25/2007 0940   PHURINE 5.5 07/25/2007 0940   GLUCOSEU NEGATIVE 07/25/2007 0940   HGBUR  NEGATIVE 07/25/2007 0940   BILIRUBINUR NEGATIVE 07/25/2007 0940   KETONESUR NEGATIVE 07/25/2007 0940   PROTEINUR NEGATIVE 07/25/2007 0940   UROBILINOGEN 0.2 07/25/2007 0940   NITRITE NEGATIVE 07/25/2007 0940   LEUKOCYTESUR  07/25/2007 0940    NEGATIVE MICROSCOPIC NOT DONE ON URINES WITH NEGATIVE PROTEIN, BLOOD, LEUKOCYTES, NITRITE, OR GLUCOSE <1000 mg/dL.   Sepsis Labs: @LABRCNTIP (procalcitonin:4,lacticidven:4) )No results found for this or any previous visit (from the past 240 hour(s)).   Radiological Exams on Admission: Dg Knee Complete 4 Views Right  Result Date: 03/14/2018 CLINICAL DATA:  Pain after fall. Abrasion. EXAM: RIGHT KNEE - COMPLETE 4+ VIEW COMPARISON:  None. FINDINGS: Soft tissue laceration associated with soft tissue swelling is identified anterior to the expected course of the patellar tendon. Total knee arthroplasty with intact hardware. No loosening or periprosthetic fracture. No bone destruction or joint effusion. IMPRESSION: Soft tissue laceration anterior to the expected course of the patellar tendon. Intact total knee arthroplasty. No acute osseous abnormality. Electronically Signed   By: Tollie Eth M.D.   On: 03/14/2018 21:43    Assessment/Plan Principal Problem:   Cellulitis of right anterior lower leg Active Problems:   Cellulitis   History of pulmonary embolus  (PE)    1. Cellulitis of the right knee with nonhealing wound for which patient has been started on empiric antibiotics.  Wound team consult has been requested.  Dr. Lajoyce Corners patient's orthopedic surgeon to give further recommendations. 2. History of PE with factor V Leyden deficiency.  In anticipation of possible procedure will keep patient on heparin and hold Coumadin if INR is less than 2. 3. History of depression on Effexor and Prozac. 4. History of sleep apnea has not been using his CPAP for last 2 years.   DVT prophylaxis: Heparin. Code Status: Full code. Family Communication: Discussed with patient. Disposition Plan: Home. Consults called: Wound team. Admission status: Inpatient.   Eduard Clos MD [facility] Pager [phone].  If 7PM-7AM, please contact night-coverage [email or web address] Password [facility]  03/15/2018, 3:44 AM

## 2018-03-15 NOTE — Progress Notes (Signed)
ANTICOAGULATION CONSULT NOTE - Initial Consult  Pharmacy Consult for heparin when INR <2.0 Indication: DVT  No Known Allergies  Patient Measurements:   Heparin Dosing Weight: 111.7 kg  Vital Signs: Temp: 98.4 F (36.9 C) (02/19 0329) Temp Source: Oral (02/19 0329) BP: 109/64 (02/19 0333) Pulse Rate: 73 (02/19 0333)  Labs: Recent Labs    03/14/18 1358 03/14/18 2100  HGB 13.1  --   HCT 42.2  --   PLT 331  --   LABPROT  --  29.3*  INR  --  2.82  CREATININE 0.81  --     Estimated Creatinine Clearance: 153.1 mL/min (by C-G formula based on SCr of 0.81 mg/dL).   Medical History: Past Medical History:  Diagnosis Date  . Anxiety   . Depression   . DVT (deep venous thrombosis) (HCC)    right clavicle and LLE  . Factor V Leiden (HCC)   . Fractures   . Frontal lobe deficit   . Hirschsprung's disease   . Hypertension   . Impingement syndrome of left ankle   . MRSA infection    left hip   . OA (osteoarthritis)    left ankle  . Sleep apnea    wears CPAP    Medications:  Medications Prior to Admission  Medication Sig Dispense Refill Last Dose  . atorvastatin (LIPITOR) 20 MG tablet Take 20 mg by mouth daily.    03/13/2018 at Unknown time  . clindamycin (CLEOCIN) 300 MG capsule Take 300 mg by mouth 3 (three) times daily.   03/14/2018 at Unknown time  . FLUoxetine (PROZAC) 40 MG capsule Take 80 mg by mouth daily.    03/14/2018 at Unknown time  . gabapentin (NEURONTIN) 400 MG capsule Take 800-1,200 mg by mouth See admin instructions. Take 800 mg by mouth in the morning and take 1200 mg by mouth at bedtime   03/14/2018 at Unknown time  . traZODone (DESYREL) 100 MG tablet Take 200 mg by mouth at bedtime.    03/13/2018 at Unknown time  . Venlafaxine HCl 225 MG TB24 Take 225 mg by mouth daily.   12 03/14/2018 at Unknown time  . warfarin (COUMADIN) 5 MG tablet Take 5-7.5 mg by mouth See admin instructions. Alternate taking 1 tablet for 1 day then 1 and 1/2 tablets the next day    03/13/2018 at 2300  . oxyCODONE-acetaminophen (PERCOCET) 10-325 MG tablet Take 1 tablet by mouth every 4 (four) hours as needed for pain. (Patient not taking: Reported on 03/14/2018) 40 tablet 0 Completed Course at Unknown time  . oxyCODONE-acetaminophen (PERCOCET) 10-325 MG tablet Take 1 tablet by mouth every 4 (four) hours as needed for pain. (Patient not taking: Reported on 03/14/2018) 42 tablet 0 Completed Course at Unknown time    Assessment: 58 yo man admitted with wound infection to start heparin when INR <2.0 for VTE treatment Goal of Therapy:  Heparin level 0.3-0.7 units/ml Monitor platelets by anticoagulation protocol: Yes   Plan:  Check INR daily Start heparin when INR <2.0  [name] 03/15/2018,3:50 AM

## 2018-03-15 NOTE — Plan of Care (Signed)

## 2018-03-15 NOTE — Progress Notes (Signed)
PROGRESS NOTE    [name]  IPP:[id number] DOB: [birth year] DOA: 03/14/2018 PCP: Simone Curia, MD  Brief Narrative: This is a 58 year old male with history of factor V Leiden mutation, history of DVT/PE on chronic Coumadin, obesity, sleep apnea, depression, history ofLeft ankle fusion in 03/2017, still not able to ambulate and barefoot on his left foot, fell and injured both knees a week ago, subsequently developed an open wound over his right knee, with surrounding redness and drainage went to [facility] emergency room and was admitted overnight subsequently discharged on 2/13 on oral antibiotics he reportedly had a CT scan done there. -Over the past week patient has noted increased redness swelling and discharge from his wound overlying his right knee went to see Dr. Lajoyce Corners yesterday who sent him to the emergency room for admission  Assessment & Plan:   Nonhealing wound overlying right knee with surrounding cellulitis -Will check CT of his right knee without contrast to further evaluate this -Continue IV vancomycin and ceftriaxone -Hold Coumadin in case operative management is needed -We will discuss with Dr.Duda to pending CT scan findings  History of PE/DVT/factor V Leiden mutation -Hold Coumadin, will start heparin when INR is subtherapeutic as a bridge in case operative management needed for above  History of depression -Continue Prozac and Effexor  History of left ankle fusion -Still with pain and inability to bear weight -Followed by Dr. Lajoyce Corners  Sleep apnea -CPAP nightly  Morbid obesity -BMI of 47 -Needs diet/lifestyle modification  DVT prophylaxis: INR therapeutic Code Status: Full code Family Communication: No family at bedside Disposition Plan: Home pending clinical improvement  Consultants:   None, need input from orthopedics pending CT scan   Procedures:   Antimicrobials:    Subjective: -No changes, continues to have pain and swelling overlying his right  ankle Objective: Vitals:   03/14/18 2245 03/14/18 2347 03/15/18 0329 03/15/18 0333  BP: 133/77 117/74 (!) 91/48 109/64  Pulse:  79 73 73  Resp:  18 18   Temp:  98.2 F (36.8 C) 98.4 F (36.9 C)   TempSrc:  Oral Oral   SpO2:  96% 95%     Intake/Output Summary (Last 24 hours) at 03/15/2018 1123 Last data filed at 03/14/2018 2203 Gross per 24 hour  Intake 100 ml  Output -  Net 100 ml   There were no vitals filed for this visit.  Examination:  General exam: Obese chronically ill-appearing male, sitting up in bed, no distress Respiratory system: Decreased breath sounds at both bases, rest clear Cardiovascular system: S1 & S2 heard, RRR. No JVD, murmurs, rubs, gallops Gastrointestinal system: Abdomen is nondistended, soft and nontender.Normal bowel sounds heard. Central nervous system: Alert and oriented. No focal neurological deficits. Extremities: Left ankle, status post fusion with limited range of motion and pain/tenderness overlying plantar aspect, right knee with open wound, some purulent discharge and significant redness warmth surrounding his wound Skin: No rashes, lesions or ulcers Psychiatry: Judgement and insight appear normal. Mood & affect appropriate.     Data Reviewed:   CBC: Recent Labs  Lab 03/14/18 1358 03/15/18 0624  WBC 9.8 8.2  NEUTROABS 6.4  --   HGB 13.1 12.8*  HCT 42.2 39.6  MCV 94.8 92.7  PLT 331 313   Basic Metabolic Panel: Recent Labs  Lab 03/14/18 1358 03/15/18 0624  NA 137 139  K 4.0 4.1  CL 104 103  CO2 24 27  GLUCOSE 88 94  BUN <5* 6  CREATININE 0.81 0.88  CALCIUM  9.3 8.9   GFR: Estimated Creatinine Clearance: 140.9 mL/min (by C-G formula based on SCr of 0.88 mg/dL). Liver Function Tests: Recent Labs  Lab 03/14/18 1358  AST 84*  ALT 55*  ALKPHOS 62  BILITOT 0.6  PROT 7.1  ALBUMIN 3.0*   No results for input(s): LIPASE, AMYLASE in the last 168 hours. No results for input(s): AMMONIA in the last 168 hours. Coagulation  Profile: Recent Labs  Lab 03/14/18 2100 03/15/18 0624  INR 2.82 2.89   Cardiac Enzymes: No results for input(s): CKTOTAL, CKMB, CKMBINDEX, TROPONINI in the last 168 hours. BNP (last 3 results) No results for input(s): PROBNP in the last 8760 hours. HbA1C: No results for input(s): HGBA1C in the last 72 hours. CBG: No results for input(s): GLUCAP in the last 168 hours. Lipid Profile: No results for input(s): CHOL, HDL, LDLCALC, TRIG, CHOLHDL, LDLDIRECT in the last 72 hours. Thyroid Function Tests: No results for input(s): TSH, T4TOTAL, FREET4, T3FREE, THYROIDAB in the last 72 hours. Anemia Panel: No results for input(s): VITAMINB12, FOLATE, FERRITIN, TIBC, IRON, RETICCTPCT in the last 72 hours. Urine analysis:    Component Value Date/Time   COLORURINE YELLOW 07/25/2007 0940   APPEARANCEUR CLEAR 07/25/2007 0940   LABSPEC 1.014 07/25/2007 0940   PHURINE 5.5 07/25/2007 0940   GLUCOSEU NEGATIVE 07/25/2007 0940   HGBUR NEGATIVE 07/25/2007 0940   BILIRUBINUR NEGATIVE 07/25/2007 0940   KETONESUR NEGATIVE 07/25/2007 0940   PROTEINUR NEGATIVE 07/25/2007 0940   UROBILINOGEN 0.2 07/25/2007 0940   NITRITE NEGATIVE 07/25/2007 0940   LEUKOCYTESUR  07/25/2007 0940    NEGATIVE MICROSCOPIC NOT DONE ON URINES WITH NEGATIVE PROTEIN, BLOOD, LEUKOCYTES, NITRITE, OR GLUCOSE <1000 mg/dL.   Sepsis Labs: @LABRCNTIP (procalcitonin:4,lacticidven:4)  )No results found for this or any previous visit (from the past 240 hour(s)).       Radiology Studies: Dg Knee Complete 4 Views Right  Result Date: 03/14/2018 CLINICAL DATA:  Pain after fall. Abrasion. EXAM: RIGHT KNEE - COMPLETE 4+ VIEW COMPARISON:  None. FINDINGS: Soft tissue laceration associated with soft tissue swelling is identified anterior to the expected course of the patellar tendon. Total knee arthroplasty with intact hardware. No loosening or periprosthetic fracture. No bone destruction or joint effusion. IMPRESSION: Soft tissue laceration  anterior to the expected course of the patellar tendon. Intact total knee arthroplasty. No acute osseous abnormality. Electronically Signed   By: Tollie Eth M.D.   On: 03/14/2018 21:43        Scheduled Meds: . atorvastatin  20 mg Oral q1800  . FLUoxetine  80 mg Oral Daily  . gabapentin  800 mg Oral Daily  . gabapentin  1,200 mg Oral QHS  . traZODone  200 mg Oral QHS  . venlafaxine XR  225 mg Oral Q breakfast   Continuous Infusions: . cefTRIAXone (ROCEPHIN)  IV Stopped (03/14/18 2203)  . vancomycin Stopped (03/15/18 0021)     LOS: 1 day    Time spent:    Zannie Cove, MD [facility]   03/15/2018, 11:23 AM

## 2018-03-16 ENCOUNTER — Other Ambulatory Visit: Payer: Self-pay

## 2018-03-16 DIAGNOSIS — S80211A Abrasion, right knee, initial encounter: Secondary | ICD-10-CM

## 2018-03-16 DIAGNOSIS — L03115 Cellulitis of right lower limb: Principal | ICD-10-CM

## 2018-03-16 LAB — BASIC METABOLIC PANEL
Anion gap: 11 (ref 5–15)
BUN: 5 mg/dL — ABNORMAL LOW (ref 6–20)
CO2: 22 mmol/L (ref 22–32)
Calcium: 8.7 mg/dL — ABNORMAL LOW (ref 8.9–10.3)
Chloride: 108 mmol/L (ref 98–111)
Creatinine, Ser: 0.8 mg/dL (ref 0.61–1.24)
GFR calc Af Amer: >60 mL/min (ref >60)
Glucose, Bld: 115 mg/dL — ABNORMAL HIGH (ref 70–99)
POTASSIUM: 4.6 mmol/L (ref 3.5–5.1)
Sodium: 141 mmol/L (ref 135–145)

## 2018-03-16 LAB — CBC
HCT: 39.3 % (ref 39.0–52.0)
Hemoglobin: 12.6 g/dL — ABNORMAL LOW (ref 13.0–17.0)
MCH: 30.1 pg (ref 26.0–34.0)
MCHC: 32.1 g/dL (ref 30.0–36.0)
MCV: 94 fL (ref 80.0–100.0)
Platelets: 310 10*3/uL (ref 150–400)
RBC: 4.18 MIL/uL — AB (ref 4.22–5.81)
RDW: 13.8 % (ref 11.5–15.5)
WBC: 7.4 10*3/uL (ref 4.0–10.5)
nRBC: 0 % (ref 0.0–0.2)

## 2018-03-16 LAB — PROTIME-INR
INR: 1.89
Prothrombin Time: 21.5 seconds — ABNORMAL HIGH (ref 11.4–15.2)

## 2018-03-16 LAB — SURGICAL PCR SCREEN
MRSA, PCR: NEGATIVE
Staphylococcus aureus: NEGATIVE

## 2018-03-16 MED ORDER — CHLORHEXIDINE GLUCONATE 4 % EX LIQD
60.0000 mL | Freq: Once | CUTANEOUS | Status: DC
  Filled 2018-03-16: qty 15

## 2018-03-16 MED ORDER — WARFARIN - PHARMACIST DOSING INPATIENT
Freq: Every day | Status: DC
  Administered 2018-03-18: 18:00:00

## 2018-03-16 MED ORDER — WARFARIN SODIUM 7.5 MG PO TABS
7.5000 mg | ORAL_TABLET | Freq: Once | ORAL | Status: AC
  Administered 2018-03-16: 7.5 mg via ORAL
  Filled 2018-03-16: qty 1

## 2018-03-16 MED ORDER — HEPARIN (PORCINE) 25000 UT/250ML-% IV SOLN
1500.0000 [IU]/h | INTRAVENOUS | Status: DC
  Filled 2018-03-16: qty 250

## 2018-03-16 MED ORDER — POVIDONE-IODINE 10 % EX SWAB: 2.0000 "application " | Freq: Once | CUTANEOUS | Status: DC

## 2018-03-16 NOTE — Progress Notes (Signed)
PT Cancellation Note  Patient Details Name: [name] MRN: [id number] DOB: 10-31-1960   Cancelled Treatment:    Reason Eval/Treat Not Completed: Medical issues which prohibited therapy upon further chart review, patient with ongoing blood clotting disorder/DVT, per pharmacy note on Coumadin but INR is not therapeutic at 1.89 (goal of 2-3). Plan to hold PT until patient is medically appropriate.    Nedra Hai PT, DPT, CBIS  Supplemental Physical Therapist [facility]    Pager [phone] Acute Rehab Office [phone]

## 2018-03-16 NOTE — Progress Notes (Signed)
ANTICOAGULATION CONSULT NOTE  Pharmacy Consult:  Coumadin Indication:  History of VTE + Factor V Leiden  No Known Allergies  Patient Measurements: Height = 71 inches Weight = 152.9 kg  Vital Signs: Temp: 97.6 F (36.4 C) (02/20 0315) Temp Source: Oral (02/20 0315) BP: 93/61 (02/20 0315) Pulse Rate: 66 (02/20 0315)  Labs: Recent Labs    03/14/18 1358 03/14/18 2100 03/15/18 0624 03/16/18 0508  HGB 13.1  --  12.8* 12.6*  HCT 42.2  --  39.6 39.3  PLT 331  --  313 310  LABPROT  --  29.3* 29.9* 21.5*  INR  --  2.Erik 2.89 1.89  CREATININE 0.81  --  0.88 0.80    Estimated Creatinine Clearance: 155 mL/min (by C-G formula based on SCr of 0.8 mg/dL).   Assessment: [name] with history of VTE and Factor V Leiden presented with nonhealing wound of the right knee.  Coumadin transitioned to IV heparin for possible surgery; however, no surgery needed and Coumadin to resume today 03/16/18.  INR decreased to sub-therapeutic level of 1.89.  CT showed traumatic hematoma, so will be cautious with Coumadin dosing.   Home Coumadin dose:  7.5mg  alternating with 5mg  daily.  Goal of Therapy:  INR 2-3 Monitor platelets by anticoagulation protocol: Yes   Plan:  Coumadin 7.5mg  PO today Daily PT / INR Monitor for hematoma resolution/expansion May need bridging if INR trends down further  Jud Fanguy D. Laney Potash, PharmD, BCPS, BCCCP 03/16/2018, 11:23 AM

## 2018-03-16 NOTE — Consult Note (Signed)
  ORTHOPAEDIC CONSULTATION  REQUESTING PHYSICIAN: Joseph, [name], MD  Chief Complaint: Patient is a 58-year-old gentleman who presents with a traumatic wound to the right knee with a right total knee arthroplasty.  Patient states he fell off his kneeling scooter landed on a metal bar on the scooter that gouged a hole in his knee.  HPI: [name] is a 58 y.o. male who presents with cellulitis and ulceration lateral aspect right knee.  Patient has been on IV antibiotics and despite antibiotics patient still has a necrotic wound bed and cellulitis.  Past Medical History:  Diagnosis Date  . Anxiety   . Depression   . DVT (deep venous thrombosis) (HCC)    right clavicle and LLE  . Factor V Leiden (HCC)   . Fractures   . Frontal lobe deficit   . Hirschsprung's disease   . Hypertension   . Impingement syndrome of left ankle   . MRSA infection    left hip   . OA (osteoarthritis)    left ankle  . Sleep apnea    wears CPAP   Past Surgical History:  Procedure Laterality Date  . ANKLE ARTHROSCOPY Left 07/14/2016   Procedure: LEFT ANKLE ARTHROSCOPY AND DEBRIDEMENT;  Surgeon: Rayna Brenner V, MD;  Location: [facility] OR;  Service: Orthopedics;  Laterality: Left;  . ANKLE FUSION Left 04/20/2017   Procedure: LEFT TIBIOCALCANEAL FUSION;  Surgeon: Yeraldy Spike V, MD;  Location: [facility] OR;  Service: Orthopedics;  Laterality: Left;  . COLON SURGERY     Mega colon  . COLONOSCOPY    . HIP SURGERY Left    At [facility]  . KNEE ARTHROSCOPY    . LEFT ANKLE SCOPE WITH DEBRIDMENT  Left 07/14/2016   Social History   Socioeconomic History  . Marital status: Single    Spouse name: Not on file  . Number of children: Not on file  . Years of education: Not on file  . Highest education level: Not on file  Occupational History  . Not on file  Social Needs  . Financial resource strain: Not on file  . Food insecurity:    Worry: Not on file    Inability: Not on file  . Transportation needs:    Medical: Not  on file    Non-medical: Not on file  Tobacco Use  . Smoking status: Current Every Day Smoker    Packs/day: 0.50    Years: 20.00    Pack years: 10.00    Types: Cigarettes  . Smokeless tobacco: Never Used  Substance and Sexual Activity  . Alcohol use: Not Currently  . Drug use: Yes    Types: Marijuana    Comment: hasn't used any in 1 mth  . Sexual activity: Not on file  Lifestyle  . Physical activity:    Days per week: Not on file    Minutes per session: Not on file  . Stress: Not on file  Relationships  . Social connections:    Talks on phone: Not on file    Gets together: Not on file    Attends religious service: Not on file    Active member of club or organization: Not on file    Attends meetings of clubs or organizations: Not on file    Relationship status: Not on file  Other Topics Concern  . Not on file  Social History Narrative  . Not on file   Family History  Problem Relation Age of Onset  . Heart attack Father    -   negative except otherwise stated in the family history section No Known Allergies Prior to Admission medications   Medication Sig Start Date End Date Taking? Authorizing Provider  atorvastatin (LIPITOR) 20 MG tablet Take 20 mg by mouth daily.  02/17/16  Yes [provider]  clindamycin (CLEOCIN) 300 MG capsule Take 300 mg by mouth 3 (three) times daily. 03/09/18  Yes [provider]  FLUoxetine (PROZAC) 40 MG capsule Take 80 mg by mouth daily.  02/16/16  Yes [provider]  gabapentin (NEURONTIN) 400 MG capsule Take 800-1,200 mg by mouth See admin instructions. Take 800 mg by mouth in the morning and take 1200 mg by mouth at bedtime 02/17/16  Yes [provider]  traZODone (DESYREL) 100 MG tablet Take 200 mg by mouth at bedtime.  03/01/16  Yes [provider]  Venlafaxine HCl 225 MG TB24 Take 225 mg by mouth daily.  12/09/17  Yes [provider]  warfarin (COUMADIN) 5 MG tablet Take 5-7.5 mg by mouth See  admin instructions. Alternate taking 1 tablet for 1 day then 1 and 1/2 tablets the next day 02/16/16  Yes [provider]  oxyCODONE-acetaminophen (PERCOCET) 10-325 MG tablet Take 1 tablet by mouth every 4 (four) hours as needed for pain. Patient not taking: Reported on 03/14/2018 04/22/17   Nadara Mustard, MD  oxyCODONE-acetaminophen (PERCOCET) 10-325 MG tablet Take 1 tablet by mouth every 4 (four) hours as needed for pain. Patient not taking: Reported on 03/14/2018 05/05/17   Nadara Mustard, MD   Ct Knee Right W Wo Contrast  Result Date: 03/15/2018 CLINICAL DATA:  Knee trauma with erythema and swelling. EXAM: CT OF THE RIGHT KNEE WITHOUT AND WITH CONTRAST TECHNIQUE: Multidetector CT imaging was performed following the standard protocol before and after bolus administration of intravenous contrast. COMPARISON:  03/08/2018 CONTRAST:  100 cc Omnipaque 300 FINDINGS: Bones/Joint/Cartilage Redemonstration a total knee arthroplasty which creates beam hardening artifacts limiting assessment of the adjacent osseous elements about the knee. No acute displaced fracture or suspicious osseous lesions. Small lucencies in the anterior femoral metaphysis and adjacent to the tibial tuberosity, series 8/10 and series 8/46 are felt secondary to a nutrient foramen or bony cleft. No evidence of hardware failure or loosening. Redemonstration of a moderate suprapatellar joint effusion. No intra-articular loose bodies. Ligaments Suboptimally assessed by CT. Muscles and Tendons No intramuscular hemorrhage or atrophy.  Intact extensor tendons. Soft tissues Pretibial subcutaneous small small hypodense fluid collection with a few foci of air may be secondary to posttraumatic hematoma and/or laceration. This is best seen on axial series 9/49 measuring approximately 2 x 1.7 cm. This is surrounded by soft tissue edema and skin thickening suspicious for cellulitis. The possibility of a small abscess is not entirely excluded. This is  not optimally visualized on the reformatted sagittal and coronal images however. IMPRESSION: Mild pretibial soft tissue induration tiny fluid collection that may represent a post traumatic hematoma. One or 2 dots of air are identified that may be associated with a soft tissue laceration accounting for this finding. Small abscess is not entirely excluded accounting for this appearance however. Surrounding cellulitis is noted the pretibial soft tissues. Total knee arthroplasty without complicating features. Moderate suprapatellar joint effusion. No apparent fracture about the knee. Should the patient have persistent symptoms, MRI of the knee may help identify occult fractures using metal artifact reduction sequences. Electronically Signed   By: Tollie Eth M.D.   On: 03/15/2018 15:22   Dg Knee Complete 4 Views Right  Result Date: 03/14/2018 CLINICAL DATA:  Pain after fall. Abrasion. EXAM: RIGHT KNEE - COMPLETE 4+ VIEW COMPARISON:  None. FINDINGS: Soft tissue laceration associated with soft tissue swelling is identified anterior to the expected course of the patellar tendon. Total knee arthroplasty with intact hardware. No loosening or periprosthetic fracture. No bone destruction or joint effusion. IMPRESSION: Soft tissue laceration anterior to the expected course of the patellar tendon. Intact total knee arthroplasty. No acute osseous abnormality. Electronically Signed   By: Tollie Eth M.D.   On: 03/14/2018 21:43   - pertinent xrays, CT, MRI studies were reviewed and independently interpreted  Positive ROS: All other systems have been reviewed and were otherwise negative with the exception of those mentioned in the HPI and as above.  Physical Exam: General: Alert, no acute distress Psychiatric: Patient is competent for consent with normal mood and affect Lymphatic: No axillary or cervical lymphadenopathy Cardiovascular: No pedal edema Respiratory: No cyanosis, no use of accessory musculature GI: No  organomegaly, abdomen is soft and non-tender    Images:  @ENCIMAGES @  Labs:  Lab Results  Component Value Date   REPTSTATUS PENDING 03/14/2018   CULT  03/14/2018    NO GROWTH 2 DAYS Performed at [facility], [address] [place], [place] [address]     Lab Results  Component Value Date   ALBUMIN 3.0 (L) 03/14/2018   ALBUMIN 3.9 04/20/2017    Neurologic: Patient does not have protective sensation bilateral lower extremities.   MUSCULOSKELETAL:   Skin: Examination patient has a traumatic wound that is 1 cm x 3 cm and over 1 cm deep with undermining.  There is necrotic tissue this extends very close to the knee capsule.  There is a surrounding area of cellulitis approximately 10 cm in diameter.  Patient has no ascending cellulitis he denies any fever or chills.  Assessment: Assessment: Cellulitis and necrotic wound lateral aspect of the right knee status post fall with persistent symptoms despite IV antibiotics.  Plan: Plan: We will plan for irrigation debridement of the wound tomorrow.  If possible I will close the wound.  If not we will need to proceed with an installation wound VAC with repeat surgery in several days.  Thank you for the consult and the opportunity to see Mr. Johnston Ebbs, MD [facility] [phone] 5:23 PM

## 2018-03-16 NOTE — Discharge Instructions (Signed)

## 2018-03-16 NOTE — Progress Notes (Signed)
ANTICOAGULATION CONSULT NOTE   Pharmacy Consult for heparin Indication: DVT  No Known Allergies  Patient Measurements:   Heparin Dosing Weight: 111.7 kg  Vital Signs: Temp: 97.6 F (36.4 C) (02/20 0315) Temp Source: Oral (02/20 0315) BP: 93/61 (02/20 0315) Pulse Rate: 66 (02/20 0315)  Labs: Recent Labs    03/14/18 1358 03/14/18 2100 03/15/18 0624 03/16/18 0508  HGB 13.1  --  12.8* 12.6*  HCT 42.2  --  39.6 39.3  PLT 331  --  313 310  LABPROT  --  29.3* 29.9* 21.5*  INR  --  2.82 2.89 1.89  CREATININE 0.81  --  0.88  --     Estimated Creatinine Clearance: 140.9 mL/min (by C-G formula based on SCr of 0.88 mg/dL).   Medical History: Past Medical History:  Diagnosis Date  . Anxiety   . Depression   . DVT (deep venous thrombosis) (HCC)    right clavicle and LLE  . Factor V Leiden (HCC)   . Fractures   . Frontal lobe deficit   . Hirschsprung's disease   . Hypertension   . Impingement syndrome of left ankle   . MRSA infection    left hip   . OA (osteoarthritis)    left ankle  . Sleep apnea    wears CPAP    Medications:  Medications Prior to Admission  Medication Sig Dispense Refill Last Dose  . atorvastatin (LIPITOR) 20 MG tablet Take 20 mg by mouth daily.    03/13/2018 at Unknown time  . clindamycin (CLEOCIN) 300 MG capsule Take 300 mg by mouth 3 (three) times daily.   03/14/2018 at Unknown time  . FLUoxetine (PROZAC) 40 MG capsule Take 80 mg by mouth daily.    03/14/2018 at Unknown time  . gabapentin (NEURONTIN) 400 MG capsule Take 800-1,200 mg by mouth See admin instructions. Take 800 mg by mouth in the morning and take 1200 mg by mouth at bedtime   03/14/2018 at Unknown time  . traZODone (DESYREL) 100 MG tablet Take 200 mg by mouth at bedtime.    03/13/2018 at Unknown time  . Venlafaxine HCl 225 MG TB24 Take 225 mg by mouth daily.   12 03/14/2018 at Unknown time  . warfarin (COUMADIN) 5 MG tablet Take 5-7.5 mg by mouth See admin instructions. Alternate taking  1 tablet for 1 day then 1 and 1/2 tablets the next day   03/13/2018 at 2300  . oxyCODONE-acetaminophen (PERCOCET) 10-325 MG tablet Take 1 tablet by mouth every 4 (four) hours as needed for pain. (Patient not taking: Reported on 03/14/2018) 40 tablet 0 Completed Course at Unknown time  . oxyCODONE-acetaminophen (PERCOCET) 10-325 MG tablet Take 1 tablet by mouth every 4 (four) hours as needed for pain. (Patient not taking: Reported on 03/14/2018) 42 tablet 0 Completed Course at Unknown time    Assessment: 58 yo man admitted with wound infection to start heparin when INR <2.0 for VTE treatment.  INR this am 1.89 Goal of Therapy:  Heparin level 0.3-0.7 units/ml Monitor platelets by anticoagulation protocol: Yes   Plan:  Start heparin at 1500 units/hr Check heparin level ~6 hours after start Daily HL and CBC Monitor for bleeding complications  Shalom Ware Poteet 03/16/2018,6:07 AM

## 2018-03-16 NOTE — Plan of Care (Signed)
  Problem: Pain Managment: Goal: General experience of comfort will improve Outcome: Progressing   Problem: Elimination: Goal: Will not experience complications related to bowel motility Outcome: Progressing Goal: Will not experience complications related to urinary retention Outcome: Progressing   

## 2018-03-16 NOTE — H&P (View-Only) (Signed)
ORTHOPAEDIC CONSULTATION  REQUESTING PHYSICIAN: [name], [name], MD  Chief Complaint: Patient is a 58 year old gentleman who presents with a traumatic wound to the right knee with a right total knee arthroplasty.  Patient states he fell off his kneeling scooter landed on a metal bar on the scooter that gouged a hole in his knee.  HPI: [name] is a 58 y.o. male who presents with cellulitis and ulceration lateral aspect right knee.  Patient has been on IV antibiotics and despite antibiotics patient still has a necrotic wound bed and cellulitis.  Past Medical History:  Diagnosis Date  . Anxiety   . Depression   . DVT (deep venous thrombosis) (HCC)    right clavicle and LLE  . Factor V Leiden (HCC)   . Fractures   . Frontal lobe deficit   . Hirschsprung's disease   . Hypertension   . Impingement syndrome of left ankle   . MRSA infection    left hip   . OA (osteoarthritis)    left ankle  . Sleep apnea    wears CPAP   Past Surgical History:  Procedure Laterality Date  . ANKLE ARTHROSCOPY Left 07/14/2016   Procedure: LEFT ANKLE ARTHROSCOPY AND DEBRIDEMENT;  Surgeon: Nadara Mustarduda, Marcus V, MD;  [facility];  Service: Orthopedics;  Laterality: Left;  . ANKLE FUSION Left 04/20/2017   Procedure: LEFT TIBIOCALCANEAL FUSION;  Surgeon: Nadara Mustarduda, Marcus V, MD;  [facility];  Service: Orthopedics;  Laterality: Left;  . COLON SURGERY     Mega colon  . COLONOSCOPY    . [facility]    [facility]  . KNEE ARTHROSCOPY    . LEFT ANKLE SCOPE WITH DEBRIDMENT  Left 07/14/2016   Social History   Socioeconomic History  . Marital status: Single    Spouse name: Not on file  . Number of children: Not on file  . Years of education: Not on file  . Highest education level: Not on file  Occupational History  . Not on file  Social Needs  . Financial resource strain: Not on file  . Food insecurity:    Worry: Not on file    Inability: Not on file  . Transportation needs:    Medical: Not  on file    Non-medical: Not on file  Tobacco Use  . Smoking status: Current Every Day Smoker    Packs/day: 0.50    Years: 20.00    Pack years: 10.00    Types: Cigarettes  . Smokeless tobacco: Never Used  Substance and Sexual Activity  . Alcohol use: Not Currently  . Drug use: Yes    Types: Marijuana    Comment: hasn't used any in 1 mth  . Sexual activity: Not on file  Lifestyle  . Physical activity:    Days per week: Not on file    Minutes per session: Not on file  . Stress: Not on file  Relationships  . Social connections:    Talks on phone: Not on file    Gets together: Not on file    Attends religious service: Not on file    Active member of club or organization: Not on file    Attends meetings of clubs or organizations: Not on file    Relationship status: Not on file  Other Topics Concern  . Not on file  Social History Narrative  . Not on file   Family History  Problem Relation Age of Onset  . Heart attack Father    -  negative except otherwise stated in the family history section No Known Allergies Prior to Admission medications   Medication Sig Start Date End Date Taking? Authorizing Provider  atorvastatin (LIPITOR) 20 MG tablet Take 20 mg by mouth daily.  02/17/16  Yes [provider]  clindamycin (CLEOCIN) 300 MG capsule Take 300 mg by mouth 3 (three) times daily. 03/09/18  Yes [provider]  FLUoxetine (PROZAC) 40 MG capsule Take 80 mg by mouth daily.  02/16/16  Yes [provider]  gabapentin (NEURONTIN) 400 MG capsule Take 800-1,200 mg by mouth See admin instructions. Take 800 mg by mouth in the morning and take 1200 mg by mouth at bedtime 02/17/16  Yes [provider]  traZODone (DESYREL) 100 MG tablet Take 200 mg by mouth at bedtime.  03/01/16  Yes [provider]  Venlafaxine HCl 225 MG TB24 Take 225 mg by mouth daily.  12/09/17  Yes [provider]  warfarin (COUMADIN) 5 MG tablet Take 5-7.5 mg by mouth See  admin instructions. Alternate taking 1 tablet for 1 day then 1 and 1/2 tablets the next day 02/16/16  Yes [provider]  oxyCODONE-acetaminophen (PERCOCET) 10-325 MG tablet Take 1 tablet by mouth every 4 (four) hours as needed for pain. Patient not taking: Reported on 03/14/2018 04/22/17   Nadara Mustard, MD  oxyCODONE-acetaminophen (PERCOCET) 10-325 MG tablet Take 1 tablet by mouth every 4 (four) hours as needed for pain. Patient not taking: Reported on 03/14/2018 05/05/17   Nadara Mustard, MD   Ct Knee Right W Wo Contrast  Result Date: 03/15/2018 CLINICAL DATA:  Knee trauma with erythema and swelling. EXAM: CT OF THE RIGHT KNEE WITHOUT AND WITH CONTRAST TECHNIQUE: Multidetector CT imaging was performed following the standard protocol before and after bolus administration of intravenous contrast. COMPARISON:  03/08/2018 CONTRAST:  100 cc Omnipaque 300 FINDINGS: Bones/Joint/Cartilage Redemonstration a total knee arthroplasty which creates beam hardening artifacts limiting assessment of the adjacent osseous elements about the knee. No acute displaced fracture or suspicious osseous lesions. Small lucencies in the anterior femoral metaphysis and adjacent to the tibial tuberosity, series 8/10 and series 8/46 are felt secondary to a nutrient foramen or bony cleft. No evidence of hardware failure or loosening. Redemonstration of a moderate suprapatellar joint effusion. No intra-articular loose bodies. Ligaments Suboptimally assessed by CT. Muscles and Tendons No intramuscular hemorrhage or atrophy.  Intact extensor tendons. Soft tissues Pretibial subcutaneous small small hypodense fluid collection with a few foci of air may be secondary to posttraumatic hematoma and/or laceration. This is best seen on axial series 9/49 measuring approximately 2 x 1.7 cm. This is surrounded by soft tissue edema and skin thickening suspicious for cellulitis. The possibility of a small abscess is not entirely excluded. This is  not optimally visualized on the reformatted sagittal and coronal images however. IMPRESSION: Mild pretibial soft tissue induration tiny fluid collection that may represent a post traumatic hematoma. One or 2 dots of air are identified that may be associated with a soft tissue laceration accounting for this finding. Small abscess is not entirely excluded accounting for this appearance however. Surrounding cellulitis is noted the pretibial soft tissues. Total knee arthroplasty without complicating features. Moderate suprapatellar joint effusion. No apparent fracture about the knee. Should the patient have persistent symptoms, MRI of the knee may help identify occult fractures using metal artifact reduction sequences. Electronically Signed   By: Tollie Eth M.D.   On: 03/15/2018 15:22   Dg Knee Complete 4 Views Right  Result Date: 03/14/2018 CLINICAL DATA:  Pain after fall. Abrasion. EXAM: RIGHT KNEE - COMPLETE 4+ VIEW COMPARISON:  None. FINDINGS: Soft tissue laceration associated with soft tissue swelling is identified anterior to the expected course of the patellar tendon. Total knee arthroplasty with intact hardware. No loosening or periprosthetic fracture. No bone destruction or joint effusion. IMPRESSION: Soft tissue laceration anterior to the expected course of the patellar tendon. Intact total knee arthroplasty. No acute osseous abnormality. Electronically Signed   By: Tollie Eth M.D.   On: 03/14/2018 21:43   - pertinent xrays, CT, MRI studies were reviewed and independently interpreted  Positive ROS: All other systems have been reviewed and were otherwise negative with the exception of those mentioned in the HPI and as above.  Physical Exam: General: Alert, no acute distress Psychiatric: Patient is competent for consent with normal mood and affect Lymphatic: No axillary or cervical lymphadenopathy Cardiovascular: No pedal edema Respiratory: No cyanosis, no use of accessory musculature GI: No  organomegaly, abdomen is soft and non-tender    Images:  @ENCIMAGES @  Labs:  Lab Results  Component Value Date   REPTSTATUS PENDING 03/14/2018   CULT  03/14/2018    NO GROWTH 2 DAYS Performed at [facility], [address] [place], [place] [address]     Lab Results  Component Value Date   ALBUMIN 3.0 (L) 03/14/2018   ALBUMIN 3.9 04/20/2017    Neurologic: Patient does not have protective sensation bilateral lower extremities.   MUSCULOSKELETAL:   Skin: Examination patient has a traumatic wound that is 1 cm x 3 cm and over 1 cm deep with undermining.  There is necrotic tissue this extends very close to the knee capsule.  There is a surrounding area of cellulitis approximately 10 cm in diameter.  Patient has no ascending cellulitis he denies any fever or chills.  Assessment: Assessment: Cellulitis and necrotic wound lateral aspect of the right knee status post fall with persistent symptoms despite IV antibiotics.  Plan: Plan: We will plan for irrigation debridement of the wound tomorrow.  If possible I will close the wound.  If not we will need to proceed with an installation wound VAC with repeat surgery in several days.  Thank you for the consult and the opportunity to see Mr. Johnston Ebbs, MD [facility] [phone] 5:23 PM

## 2018-03-16 NOTE — Progress Notes (Signed)
PT Cancellation Note  Patient Details Name: [name] MRN: [id number] DOB: 1960/04/10   Cancelled Treatment:    Reason Eval/Treat Not Completed: Medical issues which prohibited therapy per chart review, final results of foot/ankle imaging are still pending. Will follow, currently awaiting results of imaging and weight bearing status (if applicable). Plan to attempt to see later if time/schedule allow and patient is medically appropriate.    Nedra Hai PT, DPT, CBIS  Supplemental Physical Therapist [facility]    Pager [phone] Acute Rehab Office [phone]

## 2018-03-16 NOTE — Plan of Care (Signed)

## 2018-03-16 NOTE — Progress Notes (Signed)
PROGRESS NOTE    [name]  XFG:[id number] DOB: October 15, 1960 DOA: 03/14/2018 PCP: Simone Curia, MD  Brief Narrative: This is a 59 year old male with history of factor V Leiden mutation, history of DVT/PE on chronic Coumadin, obesity, sleep apnea, depression, history ofLeft ankle fusion in 03/2017, still not able to ambulate and barefoot on his left foot, fell and injured both knees a week ago, subsequently developed an open wound over his right knee, with surrounding redness and drainage went to [facility] emergency room and was admitted overnight subsequently discharged on 2/13 on oral antibiotics he reportedly had a CT scan done there. -Over the past week patient has noted increased redness swelling and discharge from his wound overlying his right knee went to see Dr. Lajoyce Corners yesterday who sent him to the emergency room for admission  Assessment & Plan:   Traumatic wound over right knee with surrounding cellulitis -CT indicates cellulitis, wound, small hematoma, no obvious drainage no abscess -Clinically improving on IV vancomycin will continue this day 2 -Discussed with Dr. Lajoyce Corners, will restart Coumadin, INR is 1.8 today -Physical therapy eval -Wound care  History of PE/DVT/factor V Leiden mutation -Do not anticipate need for OR given clinical improvement, discussed with Ortho, will restart Coumadin  History of depression -Continue Prozac and Effexor  History of left ankle fusion 3/201 9 -Still with pain and inability to bear weight -Followed by Dr. Lajoyce Corners -Physical therapy consult  Sleep apnea -CPAP nightly  Morbid obesity -BMI of 47 -Needs diet/lifestyle modification  DVT prophylaxis: INR therapeutic Code Status: Full code Family Communication: No family at bedside Disposition Plan: Home pending clinical improvement  Consultants:   Dr.Duda   Procedures:   Antimicrobials:    Subjective: -A little better, pain and swelling starting to improve  Objective: Vitals:   03/15/18 0333 03/15/18 1339 03/15/18 2100 03/16/18 0315  BP: 109/64 (!) 123/94 124/88 93/61  Pulse: 73 61 62 66  Resp:  17 18 18   Temp:  97.7 F (36.5 C) 97.8 F (36.6 C) 97.6 F (36.4 C)  TempSrc:  Oral Oral Oral  SpO2:  98% 98% 92%    Intake/Output Summary (Last 24 hours) at 03/16/2018 1109 Last data filed at 03/16/2018 0900 Gross per 24 hour  Intake 840 ml  Output 3900 ml  Net -3060 ml   There were no vitals filed for this visit.  Examination: Gen: Awake, Alert, Oriented X 3,  HEENT: PERRLA, Neck supple, no JVD Lungs: Good air movement bilaterally, CTAB CVS: RRR,No Gallops,Rubs or new Murmurs Abd: soft, Non tender, non distended, BS present Extremities: Left ankle, status post fusion with limited range of motion and pain/tenderness overlying plantar aspect, right knee with open wound, some purulent discharge and redness warmth and tenderness is improving Skin: No rashes, lesions or ulcers Psychiatry: Appropriate mood and affect    Data Reviewed:   CBC: Recent Labs  Lab 03/14/18 1358 03/15/18 0624 03/16/18 0508  WBC 9.8 8.2 7.4  NEUTROABS 6.4  --   --   HGB 13.1 12.8* 12.6*  HCT 42.2 39.6 39.3  MCV 94.8 92.7 94.0  PLT 331 313 310   Basic Metabolic Panel: Recent Labs  Lab 03/14/18 1358 03/15/18 0624 03/16/18 0508  NA 137 139 141  K 4.0 4.1 4.6  CL 104 103 108  CO2 24 27 22   GLUCOSE 88 94 115*  BUN <5* 6 5*  CREATININE 0.81 0.88 0.80  CALCIUM 9.3 8.9 8.7*   GFR: Estimated Creatinine Clearance: 155 mL/min (by C-G formula  based on SCr of 0.8 mg/dL). Liver Function Tests: Recent Labs  Lab 03/14/18 1358  AST 84*  ALT 55*  ALKPHOS 62  BILITOT 0.6  PROT 7.1  ALBUMIN 3.0*   No results for input(s): LIPASE, AMYLASE in the last 168 hours. No results for input(s): AMMONIA in the last 168 hours. Coagulation Profile: Recent Labs  Lab 03/14/18 2100 03/15/18 0624 03/16/18 0508  INR 2.82 2.89 1.89   Cardiac Enzymes: No results for input(s):  CKTOTAL, CKMB, CKMBINDEX, TROPONINI in the last 168 hours. BNP (last 3 results) No results for input(s): PROBNP in the last 8760 hours. HbA1C: No results for input(s): HGBA1C in the last 72 hours. CBG: No results for input(s): GLUCAP in the last 168 hours. Lipid Profile: No results for input(s): CHOL, HDL, LDLCALC, TRIG, CHOLHDL, LDLDIRECT in the last 72 hours. Thyroid Function Tests: No results for input(s): TSH, T4TOTAL, FREET4, T3FREE, THYROIDAB in the last 72 hours. Anemia Panel: No results for input(s): VITAMINB12, FOLATE, FERRITIN, TIBC, IRON, RETICCTPCT in the last 72 hours. Urine analysis:    Component Value Date/Time   COLORURINE YELLOW 07/25/2007 0940   APPEARANCEUR CLEAR 07/25/2007 0940   LABSPEC 1.014 07/25/2007 0940   PHURINE 5.5 07/25/2007 0940   GLUCOSEU NEGATIVE 07/25/2007 0940   HGBUR NEGATIVE 07/25/2007 0940   BILIRUBINUR NEGATIVE 07/25/2007 0940   KETONESUR NEGATIVE 07/25/2007 0940   PROTEINUR NEGATIVE 07/25/2007 0940   UROBILINOGEN 0.2 07/25/2007 0940   NITRITE NEGATIVE 07/25/2007 0940   LEUKOCYTESUR  07/25/2007 0940    NEGATIVE MICROSCOPIC NOT DONE ON URINES WITH NEGATIVE PROTEIN, BLOOD, LEUKOCYTES, NITRITE, OR GLUCOSE <1000 mg/dL.   Sepsis Labs: @LABRCNTIP (procalcitonin:4,lacticidven:4)  ) Recent Results (from the past 240 hour(s))  Blood Culture (routine x 2)     Status: None (Preliminary result)   Collection Time: 03/14/18  8:50 PM  Result Value Ref Range Status   Specimen Description BLOOD LEFT HAND  Final   Special Requests   Final    BOTTLES DRAWN AEROBIC AND ANAEROBIC Blood Culture results may not be optimal due to an inadequate volume of blood received in culture bottles   Culture   Final    NO GROWTH < 24 HOURS Performed at [facility], [address] [place], [place] [address]    Report Status PENDING  Incomplete  Blood Culture (routine x 2)     Status: None (Preliminary result)   Collection Time: 03/14/18  8:57 PM  Result Value  Ref Range Status   Specimen Description BLOOD LEFT WRIST  Final   Special Requests   Final    BOTTLES DRAWN AEROBIC AND ANAEROBIC Blood Culture results may not be optimal due to an inadequate volume of blood received in culture bottles   Culture   Final    NO GROWTH < 24 HOURS Performed at [facility], [address] [place], [place] [address]    Report Status PENDING  Incomplete         Radiology Studies: Ct Knee Right W Wo Contrast  Result Date: 03/15/2018 CLINICAL DATA:  Knee trauma with erythema and swelling. EXAM: CT OF THE RIGHT KNEE WITHOUT AND WITH CONTRAST TECHNIQUE: Multidetector CT imaging was performed following the standard protocol before and after bolus administration of intravenous contrast. COMPARISON:  03/08/2018 CONTRAST:  100 cc Omnipaque 300 FINDINGS: Bones/Joint/Cartilage Redemonstration a total knee arthroplasty which creates beam hardening artifacts limiting assessment of the adjacent osseous elements about the knee. No acute displaced fracture or suspicious osseous lesions. Small lucencies  in the anterior femoral metaphysis and adjacent to the tibial tuberosity, series 8/10 and series 8/46 are felt secondary to a nutrient foramen or bony cleft. No evidence of hardware failure or loosening. Redemonstration of a moderate suprapatellar joint effusion. No intra-articular loose bodies. Ligaments Suboptimally assessed by CT. Muscles and Tendons No intramuscular hemorrhage or atrophy.  Intact extensor tendons. Soft tissues Pretibial subcutaneous small small hypodense fluid collection with a few foci of air may be secondary to posttraumatic hematoma and/or laceration. This is best seen on axial series 9/49 measuring approximately 2 x 1.7 cm. This is surrounded by soft tissue edema and skin thickening suspicious for cellulitis. The possibility of a small abscess is not entirely excluded. This is not optimally visualized on the reformatted sagittal and coronal images however.  IMPRESSION: Mild pretibial soft tissue induration tiny fluid collection that may represent a post traumatic hematoma. One or 2 dots of air are identified that may be associated with a soft tissue laceration accounting for this finding. Small abscess is not entirely excluded accounting for this appearance however. Surrounding cellulitis is noted the pretibial soft tissues. Total knee arthroplasty without complicating features. Moderate suprapatellar joint effusion. No apparent fracture about the knee. Should the patient have persistent symptoms, MRI of the knee may help identify occult fractures using metal artifact reduction sequences. Electronically Signed   By: Tollie Eth M.D.   On: 03/15/2018 15:22   Dg Knee Complete 4 Views Right  Result Date: 03/14/2018 CLINICAL DATA:  Pain after fall. Abrasion. EXAM: RIGHT KNEE - COMPLETE 4+ VIEW COMPARISON:  None. FINDINGS: Soft tissue laceration associated with soft tissue swelling is identified anterior to the expected course of the patellar tendon. Total knee arthroplasty with intact hardware. No loosening or periprosthetic fracture. No bone destruction or joint effusion. IMPRESSION: Soft tissue laceration anterior to the expected course of the patellar tendon. Intact total knee arthroplasty. No acute osseous abnormality. Electronically Signed   By: Tollie Eth M.D.   On: 03/14/2018 21:43        Scheduled Meds: . atorvastatin  20 mg Oral q1800  . FLUoxetine  80 mg Oral Daily  . gabapentin  800 mg Oral Daily  . gabapentin  1,200 mg Oral QHS  . traZODone  200 mg Oral QHS  . venlafaxine XR  225 mg Oral Q breakfast   Continuous Infusions: . cefTRIAXone (ROCEPHIN)  IV Stopped (03/16/18 0759)  . vancomycin 1,500 mg (03/16/18 0956)     LOS: 2 days    Time spent:    Zannie Cove, MD [facility]   03/16/2018, 11:09 AM

## 2018-03-17 ENCOUNTER — Encounter (HOSPITAL_COMMUNITY)
Admission: EM | Disposition: A | Discharge status: Home Health | Payer: Self-pay | Source: Home / Self Care | Attending: Internal Medicine

## 2018-03-17 ENCOUNTER — Inpatient Hospital Stay (HOSPITAL_COMMUNITY): Payer: PPO | Admitting: Certified Registered Nurse Anesthetist

## 2018-03-17 ENCOUNTER — Encounter (HOSPITAL_COMMUNITY): Payer: Self-pay

## 2018-03-17 DIAGNOSIS — T148XXA Other injury of unspecified body region, initial encounter: Secondary | ICD-10-CM

## 2018-03-17 DIAGNOSIS — L089 Local infection of the skin and subcutaneous tissue, unspecified: Secondary | ICD-10-CM

## 2018-03-17 HISTORY — PX: I&D EXTREMITY: SHX5045

## 2018-03-17 LAB — URINALYSIS, ROUTINE W REFLEX MICROSCOPIC
Bilirubin Urine: NEGATIVE
GLUCOSE, UA: NEGATIVE mg/dL
Hgb urine dipstick: NEGATIVE
Ketones, ur: NEGATIVE mg/dL
LEUKOCYTE UA: NEGATIVE
Nitrite: NEGATIVE
PROTEIN: NEGATIVE mg/dL
Specific Gravity, Urine: 1.005 (ref 1.005–1.030)
pH: 7 (ref 5.0–8.0)

## 2018-03-17 LAB — CBC
HCT: 38.9 % — ABNORMAL LOW (ref 39.0–52.0)
Hemoglobin: 12.7 g/dL — ABNORMAL LOW (ref 13.0–17.0)
MCH: 30.8 pg (ref 26.0–34.0)
MCHC: 32.6 g/dL (ref 30.0–36.0)
MCV: 94.2 fL (ref 80.0–100.0)
Platelets: 336 10*3/uL (ref 150–400)
RBC: 4.13 MIL/uL — ABNORMAL LOW (ref 4.22–5.81)
RDW: 13.6 % (ref 11.5–15.5)
WBC: 5.4 10*3/uL (ref 4.0–10.5)
nRBC: 0 % (ref 0.0–0.2)

## 2018-03-17 LAB — PROTIME-INR
INR: 1.45
PROTHROMBIN TIME: 17.5 s — AB (ref 11.4–15.2)

## 2018-03-17 SURGERY — IRRIGATION AND DEBRIDEMENT EXTREMITY
Anesthesia: General | Laterality: Right

## 2018-03-17 MED ORDER — DEXAMETHASONE SODIUM PHOSPHATE 4 MG/ML IJ SOLN
INTRAMUSCULAR | Status: DC | PRN
  Administered 2018-03-17: 5 mg via INTRAVENOUS

## 2018-03-17 MED ORDER — 0.9 % SODIUM CHLORIDE (POUR BTL) OPTIME
TOPICAL | Status: DC | PRN
  Administered 2018-03-17 (×2): 1000 mL

## 2018-03-17 MED ORDER — FENTANYL CITRATE (PF) 250 MCG/5ML IJ SOLN
INTRAMUSCULAR | Status: AC
  Filled 2018-03-17: qty 5

## 2018-03-17 MED ORDER — ONDANSETRON HCL 4 MG/2ML IJ SOLN
INTRAMUSCULAR | Status: DC | PRN
  Administered 2018-03-17: 4 mg via INTRAVENOUS

## 2018-03-17 MED ORDER — ONDANSETRON HCL 4 MG PO TABS: 4.0000 mg | ORAL_TABLET | Freq: Four times a day (QID) | ORAL | Status: DC | PRN

## 2018-03-17 MED ORDER — SUCCINYLCHOLINE CHLORIDE 200 MG/10ML IV SOSY
PREFILLED_SYRINGE | INTRAVENOUS | Status: DC | PRN
  Administered 2018-03-17: 200 mg via INTRAVENOUS

## 2018-03-17 MED ORDER — OXYCODONE HCL 5 MG PO TABS
5.0000 mg | ORAL_TABLET | ORAL | Status: DC | PRN
  Administered 2018-03-21: 5 mg via ORAL
  Filled 2018-03-17 (×2): qty 2
  Filled 2018-03-17: qty 1

## 2018-03-17 MED ORDER — OXYCODONE HCL 5 MG PO TABS
ORAL_TABLET | ORAL | Status: AC
  Filled 2018-03-17: qty 1

## 2018-03-17 MED ORDER — SUCCINYLCHOLINE CHLORIDE 200 MG/10ML IV SOSY
PREFILLED_SYRINGE | INTRAVENOUS | Status: AC
  Filled 2018-03-17: qty 10

## 2018-03-17 MED ORDER — BISACODYL 10 MG RE SUPP: 10.0000 mg | Freq: Every day | RECTAL | Status: DC | PRN

## 2018-03-17 MED ORDER — OXYCODONE HCL 5 MG PO TABS
10.0000 mg | ORAL_TABLET | ORAL | Status: DC | PRN
  Administered 2018-03-17 – 2018-03-21 (×7): 10 mg via ORAL
  Filled 2018-03-17 (×5): qty 2

## 2018-03-17 MED ORDER — SODIUM CHLORIDE 0.9 % IV SOLN
INTRAVENOUS | Status: DC
  Administered 2018-03-21: 09:00:00 via INTRAVENOUS

## 2018-03-17 MED ORDER — METHOCARBAMOL 500 MG PO TABS
500.0000 mg | ORAL_TABLET | Freq: Four times a day (QID) | ORAL | Status: DC | PRN
  Administered 2018-03-17 – 2018-03-21 (×4): 500 mg via ORAL
  Filled 2018-03-17 (×4): qty 1

## 2018-03-17 MED ORDER — ACETAMINOPHEN 10 MG/ML IV SOLN: 1000.0000 mg | Freq: Once | INTRAVENOUS | Status: DC | PRN

## 2018-03-17 MED ORDER — METOCLOPRAMIDE HCL 5 MG PO TABS: 5.0000 mg | ORAL_TABLET | Freq: Three times a day (TID) | ORAL | Status: DC | PRN

## 2018-03-17 MED ORDER — PROPOFOL 10 MG/ML IV BOLUS
INTRAVENOUS | Status: DC | PRN
  Administered 2018-03-17: 200 mg via INTRAVENOUS

## 2018-03-17 MED ORDER — ONDANSETRON HCL 4 MG/2ML IJ SOLN: 4.0000 mg | Freq: Four times a day (QID) | INTRAMUSCULAR | Status: DC | PRN

## 2018-03-17 MED ORDER — DOCUSATE SODIUM 100 MG PO CAPS
100.0000 mg | ORAL_CAPSULE | Freq: Two times a day (BID) | ORAL | Status: DC
  Administered 2018-03-17 – 2018-03-20 (×7): 100 mg via ORAL
  Filled 2018-03-17 (×9): qty 1

## 2018-03-17 MED ORDER — METHOCARBAMOL 1000 MG/10ML IJ SOLN
500.0000 mg | Freq: Four times a day (QID) | INTRAVENOUS | Status: DC | PRN
  Filled 2018-03-17: qty 5

## 2018-03-17 MED ORDER — MIDAZOLAM HCL 5 MG/5ML IJ SOLN
INTRAMUSCULAR | Status: DC | PRN
  Administered 2018-03-17: 2 mg via INTRAVENOUS

## 2018-03-17 MED ORDER — FENTANYL CITRATE (PF) 100 MCG/2ML IJ SOLN
INTRAMUSCULAR | Status: DC | PRN
  Administered 2018-03-17: 100 ug via INTRAVENOUS

## 2018-03-17 MED ORDER — MAGNESIUM CITRATE PO SOLN: 1.0000 | Freq: Once | ORAL | Status: DC | PRN

## 2018-03-17 MED ORDER — DEXAMETHASONE SODIUM PHOSPHATE 10 MG/ML IJ SOLN
INTRAMUSCULAR | Status: AC
  Filled 2018-03-17: qty 1

## 2018-03-17 MED ORDER — LIDOCAINE 2% (20 MG/ML) 5 ML SYRINGE
INTRAMUSCULAR | Status: DC | PRN
  Administered 2018-03-17: 100 mg via INTRAVENOUS

## 2018-03-17 MED ORDER — LACTATED RINGERS IV SOLN
INTRAVENOUS | Status: DC
  Administered 2018-03-17: 12:00:00 via INTRAVENOUS

## 2018-03-17 MED ORDER — ONDANSETRON HCL 4 MG/2ML IJ SOLN
INTRAMUSCULAR | Status: AC
  Filled 2018-03-17: qty 2

## 2018-03-17 MED ORDER — OXYCODONE HCL 5 MG/5ML PO SOLN: 5.0000 mg | Freq: Once | ORAL | Status: AC | PRN

## 2018-03-17 MED ORDER — MIDAZOLAM HCL 2 MG/2ML IJ SOLN
INTRAMUSCULAR | Status: AC
  Filled 2018-03-17: qty 2

## 2018-03-17 MED ORDER — WARFARIN SODIUM 7.5 MG PO TABS
7.5000 mg | ORAL_TABLET | Freq: Once | ORAL | Status: AC
  Administered 2018-03-17: 7.5 mg via ORAL
  Filled 2018-03-17 (×2): qty 1

## 2018-03-17 MED ORDER — ACETAMINOPHEN 325 MG PO TABS: 325.0000 mg | ORAL_TABLET | Freq: Four times a day (QID) | ORAL | Status: DC | PRN

## 2018-03-17 MED ORDER — HYDROMORPHONE HCL 1 MG/ML IJ SOLN
0.5000 mg | INTRAMUSCULAR | Status: DC | PRN
  Administered 2018-03-21: 1 mg via INTRAVENOUS
  Filled 2018-03-17: qty 1

## 2018-03-17 MED ORDER — METOCLOPRAMIDE HCL 5 MG/ML IJ SOLN: 5.0000 mg | Freq: Three times a day (TID) | INTRAMUSCULAR | Status: DC | PRN

## 2018-03-17 MED ORDER — PROPOFOL 10 MG/ML IV BOLUS
INTRAVENOUS | Status: AC
  Filled 2018-03-17: qty 20

## 2018-03-17 MED ORDER — PROMETHAZINE HCL 25 MG/ML IJ SOLN: 6.2500 mg | INTRAMUSCULAR | Status: DC | PRN

## 2018-03-17 MED ORDER — POLYETHYLENE GLYCOL 3350 17 G PO PACK: 17.0000 g | PACK | Freq: Every day | ORAL | Status: DC | PRN

## 2018-03-17 MED ORDER — OXYCODONE HCL 5 MG PO TABS
5.0000 mg | ORAL_TABLET | Freq: Once | ORAL | Status: AC | PRN
  Administered 2018-03-17: 5 mg via ORAL

## 2018-03-17 MED ORDER — FENTANYL CITRATE (PF) 100 MCG/2ML IJ SOLN: 25.0000 ug | INTRAMUSCULAR | Status: DC | PRN

## 2018-03-17 SURGICAL SUPPLY — 34 items
BLADE SURG 21 STRL SS (BLADE) ×3 IMPLANT
BNDG COHESIVE 6X5 TAN STRL LF (GAUZE/BANDAGES/DRESSINGS) ×2 IMPLANT
BNDG GAUZE ELAST 4 BULKY (GAUZE/BANDAGES/DRESSINGS) ×4 IMPLANT
COVER SURGICAL LIGHT HANDLE (MISCELLANEOUS) ×6 IMPLANT
COVER WAND RF STERILE (DRAPES) ×3 IMPLANT
DRAPE U-SHAPE 47X51 STRL (DRAPES) ×3 IMPLANT
DRSG ADAPTIC 3X8 NADH LF (GAUZE/BANDAGES/DRESSINGS) ×3 IMPLANT
DURAPREP 26ML APPLICATOR (WOUND CARE) ×3 IMPLANT
ELECT REM PT RETURN 9FT ADLT (ELECTROSURGICAL)
ELECTRODE REM PT RTRN 9FT ADLT (ELECTROSURGICAL) IMPLANT
GAUZE SPONGE 4X4 12PLY STRL (GAUZE/BANDAGES/DRESSINGS) ×3 IMPLANT
GAUZE SPONGE 4X4 12PLY STRL LF (GAUZE/BANDAGES/DRESSINGS) ×2 IMPLANT
GLOVE BIOGEL PI IND STRL 9 (GLOVE) ×1 IMPLANT
GLOVE BIOGEL PI INDICATOR 9 (GLOVE) ×2
GLOVE SURG ORTHO 9.0 STRL STRW (GLOVE) ×3 IMPLANT
GOWN STRL REUS W/ TWL XL LVL3 (GOWN DISPOSABLE) ×2 IMPLANT
GOWN STRL REUS W/TWL XL LVL3 (GOWN DISPOSABLE) ×4
HANDPIECE INTERPULSE COAX TIP (DISPOSABLE)
KIT BASIN OR (CUSTOM PROCEDURE TRAY) ×3 IMPLANT
KIT TURNOVER KIT B (KITS) ×3 IMPLANT
MANIFOLD NEPTUNE II (INSTRUMENTS) ×3 IMPLANT
NS IRRIG 1000ML POUR BTL (IV SOLUTION) ×3 IMPLANT
PACK ORTHO EXTREMITY (CUSTOM PROCEDURE TRAY) ×3 IMPLANT
PAD ABD 8X10 STRL (GAUZE/BANDAGES/DRESSINGS) ×2 IMPLANT
PAD ARMBOARD 7.5X6 YLW CONV (MISCELLANEOUS) ×6 IMPLANT
SET HNDPC FAN SPRY TIP SCT (DISPOSABLE) IMPLANT
STOCKINETTE IMPERVIOUS 9X36 MD (GAUZE/BANDAGES/DRESSINGS) IMPLANT
SUT ETHILON 2 0 PSLX (SUTURE) ×2 IMPLANT
SWAB COLLECTION DEVICE MRSA (MISCELLANEOUS) ×3 IMPLANT
SWAB CULTURE ESWAB REG 1ML (MISCELLANEOUS) IMPLANT
TOWEL OR 17X26 10 PK STRL BLUE (TOWEL DISPOSABLE) ×3 IMPLANT
TUBE CONNECTING 12'X1/4 (SUCTIONS) ×1
TUBE CONNECTING 12X1/4 (SUCTIONS) ×2 IMPLANT
YANKAUER SUCT BULB TIP NO VENT (SUCTIONS) ×3 IMPLANT

## 2018-03-17 NOTE — Plan of Care (Signed)
  Problem: Pain Managment: Goal: General experience of comfort will improve Outcome: Progressing   

## 2018-03-17 NOTE — Evaluation (Signed)
Physical Therapy Evaluation Patient Details Name: [name] MRN: [id number] DOB: 01-10-1961 Today's Date: 03/17/2018   History of Present Illness  Patient is a 58 y/o male who presents with cellulitis of right knee with non healing wound. Plan for I&D right knee 2/21. 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  Clinical Impression  Patient presents with left ankle pain, generalized weakness, impaired balance and impaired mobility s/p above. Pt lives alone in studio, [place] for [place] and uses SPC/RW vs knee scooter for mobility. Reports multiple falls at home using knee scooter. Pt does not have support or transportation. Has many stressors currently relating to pain control, finances and overall function/health. Tolerated short distance ambulation with use of RW and close min guard for safety. Open to any suggestions to manage pain and improve function. Recommend HHPT and w/c for mobility to decrease fall risk. Will follow acutely to maximize independence and mobility prior to return home. Needs pain control to manage left ankle to allow for more mobility.    Follow Up Recommendations Home health PT;Supervision for mobility/OOB    Equipment Recommendations  Wheelchair (measurements PT);Wheelchair cushion (measurements PT)    Recommendations for Other Services       Precautions / Restrictions Precautions Precautions: Fall Restrictions Weight Bearing Restrictions: No      Mobility  Bed Mobility Overal bed mobility: Modified Independent             General bed mobility comments: no assist needed.   Transfers Overall transfer level: Modified independent Equipment used: None             General transfer comment: Stood from EOB without difficulty. Pain in left heel with WB.   Ambulation/Gait Ambulation/Gait assistance: Min guard Gait Distance (Feet): 15 Feet Assistive device: Rolling walker  (2 wheeled) Gait Pattern/deviations: Step-to pattern;Decreased stance time - left;Trunk flexed Gait velocity: decreased Gait velocity interpretation: <1.8 ft/sec, indicate of risk for recurrent falls General Gait Details: Slow, unsteady gait with decreased stance time LLE, WB mainly through heel due to pain.   Stairs            Wheelchair Mobility    Modified Rankin (Stroke Patients Only)       Balance Overall balance assessment: Needs assistance;History of Falls Sitting-balance support: Feet supported;No upper extremity supported Sitting balance-Leahy Scale: Good Sitting balance - Comments: Able to wash self up sitting EOB without difficulty.    Standing balance support: During functional activity;Bilateral upper extremity supported Standing balance-Leahy Scale: Poor Standing balance comment: Able to stand statically without UE support, requires UE support for ambulation.                             Pertinent Vitals/Pain Pain Assessment: Faces Faces Pain Scale: Hurts even more Pain Location: left ankle Pain Descriptors / Indicators: Sore;Sharp;Guarding Pain Intervention(s): Monitored during session;Repositioned;Limited activity within patient's tolerance    Home Living Family/patient expects to be discharged to:: Private residence Living Arrangements: Alone   Type of Home: Apartment Home Access: Stairs to enter   [employer] of Steps: 1 Home Layout: One level Home Equipment: Environmental consultant - 2 wheels;Cane - single point;Other (comment) Additional Comments: knee scooter    Prior Function Level of Independence: Independent with assistive device(s)         Comments: Uses SPC as needed for household distance and knee scooter for community ambulation. Reports ~1 fall  per month using knee scooter. Pain is not controlled and pt not taking anything at home. high pain tolerance to oxy.      Hand Dominance        Extremity/Trunk Assessment    Upper Extremity Assessment Upper Extremity Assessment: Defer to OT evaluation    Lower Extremity Assessment Lower Extremity Assessment: LLE deficits/detail;RLE deficits/detail RLE Deficits / Details: Wound bandaged and not visible. Grossly ~4/5 throughout LLE Deficits / Details: no ankle AROM secondary to fusion    Cervical / Trunk Assessment Cervical / Trunk Assessment: Other exceptions Cervical / Trunk Exceptions: hx of back issues- MD wants to do surgery but pt declining  Communication   Communication: No difficulties  Cognition Arousal/Alertness: Awake/alert Behavior During Therapy: WFL for tasks assessed/performed Overall Cognitive Status: No family/caregiver present to determine baseline cognitive functioning                                 General Comments: Seems WFL for basic mobility tasks. Pt frustrated about current situation- not finding solution to left ankle pain, stressors related to finances and almost eviction. Wants to get better and asking for help.      General Comments      Exercises     Assessment/Plan    PT Assessment Patient needs continued PT services  PT Problem List Decreased strength;Decreased mobility;Obesity;Decreased activity tolerance;Pain;Decreased balance       PT Treatment Interventions Functional mobility training;Balance training;Patient/family education;Gait training;Therapeutic activities;Neuromuscular re-education;Retail buyer;Therapeutic exercise;DME instruction    PT Goals (Current goals can be found in the Care Plan section)  Acute Rehab PT Goals Patient Stated Goal: to make this pain less and be able to get around PT Goal Formulation: With patient Time For Goal Achievement: 03/31/18 Potential to Achieve Goals: Good    Frequency Min 3X/week   Barriers to discharge Decreased caregiver support lives alone    Co-evaluation               AM-PAC PT "6 Clicks" Mobility   Outcome Measure Help needed turning from your back to your side while in a flat bed without using bedrails?: None Help needed moving from lying on your back to sitting on the side of a flat bed without using bedrails?: None Help needed moving to and from a bed to a chair (including a wheelchair)?: None Help needed standing up from a chair using your arms (e.g., wheelchair or bedside chair)?: A Little Help needed to walk in hospital room?: A Little Help needed climbing 3-5 steps with a railing? : A Lot 6 Click Score: 20    End of Session Equipment Utilized During Treatment: Gait belt Activity Tolerance: Patient limited by pain;Patient tolerated treatment well Patient left: in bed;with call bell/phone within reach;Other (comment)(going down to OR for surgery) Nurse Communication: Mobility status PT Visit Diagnosis: Pain;Difficulty in walking, not elsewhere classified (R26.2);Muscle weakness (generalized) (M62.81);Unsteadiness on feet (R26.81) Pain - Right/Left: Left Pain - part of body: Ankle and joints of foot    Time: 1035-1100 PT Time Calculation (min) (ACUTE ONLY): 25 min   Charges:   PT Evaluation $PT Eval Low Complexity: 1 Low PT Treatments $Therapeutic Activity: 8-22 mins        Mylo Red, PT, DPT Acute Rehabilitation Services Pager [phone] Office [phone]      Blake Divine A Lanier Ensign 03/17/2018, 11:09 AM

## 2018-03-17 NOTE — Progress Notes (Signed)
Patient came to unit with Vancomycin hanging.  The clap was close on the tubing so the patient was receiving his normal saline instead of the vancomycin.  Per surgical order from Dr. Lajoyce Corners, no antibiotic needed.  Confirming with Dr. Lajoyce Corners that he would like the patient to receive this dose of Vancomycin.

## 2018-03-17 NOTE — Anesthesia Procedure Notes (Signed)
Procedure Name: Intubation Date/Time: 03/17/2018 1:41 PM Performed by: Mayer Camel, CRNA Pre-anesthesia Checklist: Patient identified, Emergency Drugs available, Suction available and Patient being monitored Patient Re-evaluated:Patient Re-evaluated prior to induction Oxygen Delivery Method: Circle System Utilized Preoxygenation: Pre-oxygenation with 100% oxygen Induction Type: IV induction Ventilation: Mask ventilation without difficulty Laryngoscope Size: Glidescope and 4 Grade View: Grade I Tube type: Oral Tube size: 7.5 mm Number of attempts: 1 Airway Equipment and Method: Stylet and Oral airway Placement Confirmation: ETT inserted through vocal cords under direct vision,  positive ETCO2 and breath sounds checked- equal and bilateral Secured at: 23 cm Tube secured with: Tape Dental Injury: Teeth and Oropharynx as per pre-operative assessment

## 2018-03-17 NOTE — Transfer of Care (Signed)
Immediate Anesthesia Transfer of Care Note  Patient: [name]  Procedure(s) Performed: IRRIGATION AND DEBRIDEMENT RIGHT KNEE (Right )  Patient Location: PACU  Anesthesia Type:General  Level of Consciousness: awake, alert  and oriented  Airway & Oxygen Therapy: Patient Spontanous Breathing and Patient connected to face mask oxygen  Post-op Assessment: Report given to RN and Post -op Vital signs reviewed and stable  Post vital signs: Reviewed and stable  Last Vitals:  Vitals Value Taken Time  BP 126/75 03/17/2018  2:09 PM  Temp    Pulse 86 03/17/2018  2:12 PM  Resp 18 03/17/2018  2:12 PM  SpO2 98 % 03/17/2018  2:12 PM  Vitals shown include unvalidated device data.  Last Pain:  Vitals:   03/17/18 1129  TempSrc:   PainSc: 7       Patients Stated Pain Goal: 5 (03/17/18 1129)  Complications: No apparent anesthesia complications

## 2018-03-17 NOTE — Anesthesia Postprocedure Evaluation (Signed)
Anesthesia Post Note  Patient: SUEDE MONIGOLD  Procedure(s) Performed: IRRIGATION AND DEBRIDEMENT RIGHT KNEE (Right )     Patient location during evaluation: PACU Anesthesia Type: General Level of consciousness: awake and alert Pain management: pain level controlled Vital Signs Assessment: post-procedure vital signs reviewed and stable Respiratory status: spontaneous breathing, nonlabored ventilation and respiratory function stable Cardiovascular status: blood pressure returned to baseline and stable Postop Assessment: no apparent nausea or vomiting Anesthetic complications: no    Last Vitals:  Vitals:   03/17/18 0425 03/17/18 1410  BP: 139/83 126/75  Pulse: 84 80  Resp: 20 14  Temp: 36.7 C 36.5 C  SpO2: (!) 88% 95%    Last Pain:  Vitals:   03/17/18 1410  TempSrc:   PainSc: (P) 0-No pain        RLE Motor Response: (P) Purposeful movement;Responds to commands (03/17/18 1410) RLE Sensation: (P) Full sensation (03/17/18 1410)      Kaylyn Layer

## 2018-03-17 NOTE — Progress Notes (Signed)
ANTICOAGULATION CONSULT NOTE  Pharmacy Consult:  Coumadin Indication:  History of VTE + Factor V Leiden  No Known Allergies  Patient Measurements: Height = 71 inches Weight = 152.9 kg  Vital Signs: Temp: 98 F (36.7 C) (02/21 0425) Temp Source: Oral (02/21 0425) BP: 139/83 (02/21 0425) Pulse Rate: 84 (02/21 0425)  Labs: Recent Labs    03/14/18 1358  03/15/18 0624 03/16/18 0508 03/17/18 0419  HGB 13.1  --  12.8* 12.6* 12.7*  HCT 42.2  --  39.6 39.3 38.9*  PLT 331  --  313 310 336  LABPROT  --    < > 29.9* 21.5* 17.5*  INR  --    < > 2.89 1.89 1.45  CREATININE 0.81  --  0.88 0.80  --    < > = values in this interval not displayed.    Estimated Creatinine Clearance: 155 mL/min (by C-G formula based on SCr of 0.8 mg/dL).   Assessment: 70 YOM with history of VTE and Factor V Leiden presented with nonhealing wound of the right knee.  Coumadin transitioned to IV heparin for possible surgery; however, no surgery needed and Coumadin to resume today 03/16/18.  INR decreased to sub-therapeutic level of 1.89.  CT showed traumatic hematoma, so will be cautious with Coumadin dosing.   Home Coumadin dose:  7.5mg  alternating with 5mg  daily.  Goal of Therapy:  INR 2-3 Monitor platelets by anticoagulation protocol: Yes   Plan:  Coumadin 7.5mg  PO today after OR Daily PT / INR Patient going to the OR today for I&D of right knee -  Monitor for bleeding May need bridging once stable post-op if INR trends down further  Jeanella Cara, PharmD, [facility] Clinical Pharmacist Please see AMION for all Pharmacists' Contact Phone Numbers 03/17/2018, 11:07 AM

## 2018-03-17 NOTE — Anesthesia Preprocedure Evaluation (Signed)
Anesthesia Evaluation  Patient identified by MRN, date of birth, ID band Patient awake    Reviewed: Allergy & Precautions, Patient's Chart, lab work & pertinent test results  Airway Mallampati: II  TM Distance: >3 FB Neck ROM: Full    Dental  (+) Dental Advisory Given, Chipped,    Pulmonary sleep apnea (noncompliant with CPAP) , Current Smoker,    Pulmonary exam normal breath sounds clear to auscultation       Cardiovascular hypertension, Pt. on medications Normal cardiovascular exam Rhythm:Regular Rate:Normal     Neuro/Psych Anxiety Depression negative neurological ROS     GI/Hepatic negative GI ROS, Neg liver ROS,   Endo/Other  Morbid obesity  Renal/GU negative Renal ROS     Musculoskeletal  (+) Arthritis ,   Abdominal   Peds  Hematology Factor V leiden, hx of DVT/PE in 90s   Anesthesia Other Findings Day of surgery medications reviewed with the patient.  Reproductive/Obstetrics                             Anesthesia Physical Anesthesia Plan  ASA: III  Anesthesia Plan: General   Post-op Pain Management:    Induction: Intravenous  PONV Risk Score and Plan: 2 and Treatment may vary due to age or medical condition, Ondansetron, Dexamethasone and Midazolam  Airway Management Planned: Oral ETT  Additional Equipment:   Intra-op Plan:   Post-operative Plan: Extubation in OR  Informed Consent: I have reviewed the patients History and Physical, chart, labs and discussed the procedure including the risks, benefits and alternatives for the proposed anesthesia with the patient or authorized representative who has indicated his/her understanding and acceptance.     Dental advisory given  Plan Discussed with: CRNA  Anesthesia Plan Comments:         Anesthesia Quick Evaluation

## 2018-03-17 NOTE — Progress Notes (Signed)
PROGRESS NOTE    [name]  YQI:[id number] DOB: 10/05/1960 DOA: 03/14/2018 PCP: Simone CuriaLee, Keung, MD  Brief Narrative: This is a 58 year old male with history of factor V Leiden mutation, history of DVT/PE on chronic Coumadin, obesity, sleep apnea, depression, history ofLeft ankle fusion in 03/2017, still not able to ambulate and barefoot on his left foot, fell and injured both knees a week ago, subsequently developed an open wound over his right knee, with surrounding redness and drainage [facility] and was admitted overnight subsequently discharged on 2/13 on oral antibiotics he reportedly had a CT scan done there. -Over the past week patient has noted increased redness swelling and discharge from his wound overlying his right knee went to see Dr. Lajoyce Cornersuda who sent him to the emergency room for admission  Assessment & Plan:   Traumatic nectrotic wound over right knee with surrounding cellulitis -CT indicates cellulitis, wound, small hematoma, no obvious drainage no abscess -Clinically improving on IV vancomycin will continue this day 4 -Discussed with Dr. Lajoyce Cornersuda, and for surgical debridement of wound tomorrow  -INR is 1.6 today, continue Coumadin tonight  -Wound care  History of PE/DVT/factor V Leiden mutation -Coumadin was held briefly, restarted, INR 1.6 today  -continue Coumadin tonight postop  History of depression -Continue Prozac and Effexor  History of left ankle fusion 3/201 9 -Still with pain and inability to bear weight -Followed by Dr. Lajoyce Cornersuda -Physical therapy consult  Sleep apnea -CPAP nightly  Morbid obesity -BMI of 47 -Needs diet/lifestyle modification  DVT prophylaxis: INR therapeutic Code Status: Full code Family Communication: No family at bedside Disposition Plan: Home pending clinical improvement, likely Monday  Consultants:   Dr.Duda   Procedures:   Antimicrobials:    Subjective: -Feels okay, swelling redness and pain is improving,  Dr. Lajoyce Cornersuda plans debridement of wound today  Objective: Vitals:   03/16/18 1340 03/16/18 2016 03/17/18 0425 03/17/18 1132  BP: 117/69 121/75 139/83   Pulse: 69 67 84   Resp: 17 20 20    Temp: 97.7 F (36.5 C) 98.4 F (36.9 C) 98 F (36.7 C)   TempSrc: Oral Oral Oral   SpO2: 96% 96% (!) 88%   Weight:    (!) 152.9 kg  Height:    5\' 11"  (1.803 m)    Intake/Output Summary (Last 24 hours) at 03/17/2018 1148 Last data filed at 03/17/2018 0846 Gross per 24 hour  Intake 2960 ml  Output 3820 ml  Net -860 ml   Filed Weights   03/17/18 1132  Weight: (!) 152.9 kg    Examination: Gen: Awake, Alert, Oriented X 3, obese male, no distress HEENT: PERRLA, Neck supple, no JVD Lungs: Good air movement bilaterally, CTAB CVS: RRR,No Gallops,Rubs or new Murmurs Abd: soft, Non tender, non distended, BS present Extremities: Right knee with open wound, some purulence, redness and surrounding tenderness is improving, left ankle status post fusion, limited range of motion Skin: As above Psychiatry: Appropriate mood and affect    Data Reviewed:   CBC: Recent Labs  Lab 03/14/18 1358 03/15/18 0624 03/16/18 0508 03/17/18 0419  WBC 9.8 8.2 7.4 5.4  NEUTROABS 6.4  --   --   --   HGB 13.1 12.8* 12.6* 12.7*  HCT 42.2 39.6 39.3 38.9*  MCV 94.8 92.7 94.0 94.2  PLT 331 313 310 336   Basic Metabolic Panel: Recent Labs  Lab 03/14/18 1358 03/15/18 0624 03/16/18 0508  NA 137 139 141  K 4.0 4.1 4.6  CL 104 103  108  CO2 24 27 22   GLUCOSE 88 94 115*  BUN <5* 6 5*  CREATININE 0.81 0.88 0.80  CALCIUM 9.3 8.9 8.7*   GFR: Estimated Creatinine Clearance: 155 mL/min (by C-G formula based on SCr of 0.8 mg/dL). Liver Function Tests: Recent Labs  Lab 03/14/18 1358  AST 84*  ALT 55*  ALKPHOS 62  BILITOT 0.6  PROT 7.1  ALBUMIN 3.0*   No results for input(s): LIPASE, AMYLASE in the last 168 hours. No results for input(s): AMMONIA in the last 168 hours. Coagulation Profile: Recent Labs  Lab  03/14/18 2100 03/15/18 0624 03/16/18 0508 03/17/18 0419  INR 2.82 2.89 1.89 1.45   Cardiac Enzymes: No results for input(s): CKTOTAL, CKMB, CKMBINDEX, TROPONINI in the last 168 hours. BNP (last 3 results) No results for input(s): PROBNP in the last 8760 hours. HbA1C: No results for input(s): HGBA1C in the last 72 hours. CBG: No results for input(s): GLUCAP in the last 168 hours. Lipid Profile: No results for input(s): CHOL, HDL, LDLCALC, TRIG, CHOLHDL, LDLDIRECT in the last 72 hours. Thyroid Function Tests: No results for input(s): TSH, T4TOTAL, FREET4, T3FREE, THYROIDAB in the last 72 hours. Anemia Panel: No results for input(s): VITAMINB12, FOLATE, FERRITIN, TIBC, IRON, RETICCTPCT in the last 72 hours. Urine analysis:    Component Value Date/Time   COLORURINE COLORLESS (A) 03/17/2018 0013   APPEARANCEUR CLEAR 03/17/2018 0013   LABSPEC 1.005 03/17/2018 0013   PHURINE 7.0 03/17/2018 0013   GLUCOSEU NEGATIVE 03/17/2018 0013   HGBUR NEGATIVE 03/17/2018 0013   BILIRUBINUR NEGATIVE 03/17/2018 0013   KETONESUR NEGATIVE 03/17/2018 0013   PROTEINUR NEGATIVE 03/17/2018 0013   UROBILINOGEN 0.2 07/25/2007 0940   NITRITE NEGATIVE 03/17/2018 0013   LEUKOCYTESUR NEGATIVE 03/17/2018 0013   Sepsis Labs: @LABRCNTIP (procalcitonin:4,lacticidven:4)  ) Recent Results (from the past 240 hour(s))  Blood Culture (routine x 2)     Status: None (Preliminary result)   Collection Time: 03/14/18  8:50 PM  Result Value Ref Range Status   Specimen Description BLOOD LEFT HAND  Final   Special Requests   Final    BOTTLES DRAWN AEROBIC AND ANAEROBIC Blood Culture results may not be optimal due to an inadequate volume of blood received in culture bottles Performed at [facility], [address], [place], [place] [address]    Culture NO GROWTH 3 DAYS  Final   Report Status PENDING  Incomplete  Blood Culture (routine x 2)     Status: None (Preliminary result)   Collection Time: 03/14/18  8:57  PM  Result Value Ref Range Status   Specimen Description BLOOD LEFT WRIST  Final   Special Requests   Final    BOTTLES DRAWN AEROBIC AND ANAEROBIC Blood Culture results may not be optimal due to an inadequate volume of blood received in culture bottles Performed at [facility], [address], [place], [place] [address]    Culture NO GROWTH 3 DAYS  Final   Report Status PENDING  Incomplete  Surgical pcr screen     Status: None   Collection Time: 03/16/18  6:42 PM  Result Value Ref Range Status   MRSA, PCR NEGATIVE NEGATIVE Final   Staphylococcus aureus NEGATIVE NEGATIVE Final    Comment: (NOTE) The Xpert SA Assay (FDA approved for NASAL specimens in patients 54 years of age and older), is one component of a comprehensive surveillance program. It is not intended to diagnose infection nor to guide or monitor treatment. Performed at [facility]  Lab, [address], [place], [place] [address]          Radiology Studies: Ct Knee Right W Wo Contrast  Result Date: 03/15/2018 CLINICAL DATA:  Knee trauma with erythema and swelling. EXAM: CT OF THE RIGHT KNEE WITHOUT AND WITH CONTRAST TECHNIQUE: Multidetector CT imaging was performed following the standard protocol before and after bolus administration of intravenous contrast. COMPARISON:  03/08/2018 CONTRAST:  100 cc Omnipaque 300 FINDINGS: Bones/Joint/Cartilage Redemonstration a total knee arthroplasty which creates beam hardening artifacts limiting assessment of the adjacent osseous elements about the knee. No acute displaced fracture or suspicious osseous lesions. Small lucencies in the anterior femoral metaphysis and adjacent to the tibial tuberosity, series 8/10 and series 8/46 are felt secondary to a nutrient foramen or bony cleft. No evidence of hardware failure or loosening. Redemonstration of a moderate suprapatellar joint effusion. No intra-articular loose bodies. Ligaments Suboptimally assessed by CT. Muscles and Tendons No  intramuscular hemorrhage or atrophy.  Intact extensor tendons. Soft tissues Pretibial subcutaneous small small hypodense fluid collection with a few foci of air may be secondary to posttraumatic hematoma and/or laceration. This is best seen on axial series 9/49 measuring approximately 2 x 1.7 cm. This is surrounded by soft tissue edema and skin thickening suspicious for cellulitis. The possibility of a small abscess is not entirely excluded. This is not optimally visualized on the reformatted sagittal and coronal images however. IMPRESSION: Mild pretibial soft tissue induration tiny fluid collection that may represent a post traumatic hematoma. One or 2 dots of air are identified that may be associated with a soft tissue laceration accounting for this finding. Small abscess is not entirely excluded accounting for this appearance however. Surrounding cellulitis is noted the pretibial soft tissues. Total knee arthroplasty without complicating features. Moderate suprapatellar joint effusion. No apparent fracture about the knee. Should the patient have persistent symptoms, MRI of the knee may help identify occult fractures using metal artifact reduction sequences. Electronically Signed   By: Tollie Eth M.D.   On: 03/15/2018 15:22        Scheduled Meds: . [MAR Hold] atorvastatin  20 mg Oral q1800  . chlorhexidine  60 mL Topical Once  . [MAR Hold] FLUoxetine  80 mg Oral Daily  . [MAR Hold] gabapentin  800 mg Oral Daily  . [MAR Hold] gabapentin  1,200 mg Oral QHS  . povidone-iodine  2 application Topical Once  . [MAR Hold] traZODone  200 mg Oral QHS  . [MAR Hold] venlafaxine XR  225 mg Oral Q breakfast  . warfarin  7.5 mg Oral ONCE-1800  . [MAR Hold] Warfarin - Pharmacist Dosing Inpatient   Does not apply q1800   Continuous Infusions: . [MAR Hold] cefTRIAXone (ROCEPHIN)  IV 2 g (03/16/18 2106)  . lactated ringers    . [MAR Hold] vancomycin 1,500 mg (03/17/18 1100)     LOS: 3 days    Time spent:     Zannie Cove, MD [facility]   03/17/2018, 11:48 AM

## 2018-03-17 NOTE — Interval H&P Note (Signed)
History and Physical Interval Note:  03/17/2018 6:50 AM  [name]  has presented today for surgery, with the diagnosis of Abscess Right Knee  The various methods of treatment have been discussed with the patient and family. After consideration of risks, benefits and other options for treatment, the patient has consented to  Procedure(s): IRRIGATION AND DEBRIDEMENT RIGHT KNEE (Right) APPLICATION OF WOUND VAC (Right) as a surgical intervention .  The patient's history has been reviewed, patient examined, no change in status, stable for surgery.  I have reviewed the patient's chart and labs.  Questions were answered to the patient's satisfaction.     Nadara Mustard

## 2018-03-17 NOTE — Plan of Care (Signed)

## 2018-03-17 NOTE — Op Note (Signed)
03/17/2018  2:01 PM  PATIENT:  [name]    PRE-OPERATIVE DIAGNOSIS:  Abscess Right Knee  POST-OPERATIVE DIAGNOSIS:  Same  PROCEDURE:  IRRIGATION AND DEBRIDEMENT RIGHT KNEE Excision skin and soft tissue fascia and muscle. Local tissue rearrangement for wound closure 3 x 7 cm. Cultures obtained x2 from necrotic abscess tissue.  SURGEON:  Nadara Mustard, MD  PHYSICIAN ASSISTANT:None ANESTHESIA:   General  PREOPERATIVE INDICATIONS:  [name] is a  58 y.o. male with a diagnosis of Abscess Right Knee who failed conservative measures and elected for surgical management.    The risks benefits and alternatives were discussed with the patient preoperatively including but not limited to the risks of infection, bleeding, nerve injury, cardiopulmonary complications, the need for revision surgery, among others, and the patient was willing to proceed.  OPERATIVE IMPLANTS: None  @ENCIMAGES @  OPERATIVE FINDINGS: Necrotic abscess.  OPERATIVE PROCEDURE: Patient was brought the operating room and underwent a general anesthetic.  After adequate levels anesthesia were obtained patient's right lower extremity was prepped using DuraPrep draped into a sterile field a timeout was called.  An elliptical football shaped incision was made around the laceration.  This left a wound that was 7 x 3 cm.  It extended medially to the vertical total knee incision and extended obliquely distally and laterally.  There is necrotic purulent tissue and this was sent for cultures x2.  Using a knife and rondure skin and soft tissue muscle and fascia was excised.  There is no deep abscess all necrotic tissue was removed the wound was irrigated with normal saline electrocautery was used for hemostasis.  All wound edges were healthy bleeding and viable.  Local tissue rearrangement was used for local wound closure with 2-0 nylon for wound 3 x 7 cm.  Sterile dressing was applied patient was extubated taken to PACU in stable  condition.   DISCHARGE PLANNING:  Antibiotic duration: Continue IV antibiotics as per sensitivities.  Weightbearing: Weightbearing as tolerated  Pain medication: Opioid pathway ordered  Dressing care/ Wound VAC: Reinforce dressing as needed  Ambulatory devices: Walker  Discharge to: Anticipate discharge to home.  Follow-up: In the office 1 week post operative.

## 2018-03-18 ENCOUNTER — Encounter (HOSPITAL_COMMUNITY): Payer: Self-pay | Admitting: Orthopedic Surgery

## 2018-03-18 LAB — CBC
HEMATOCRIT: 39 % (ref 39.0–52.0)
HEMOGLOBIN: 12.1 g/dL — AB (ref 13.0–17.0)
MCH: 29.1 pg (ref 26.0–34.0)
MCHC: 31 g/dL (ref 30.0–36.0)
MCV: 93.8 fL (ref 80.0–100.0)
Platelets: 349 10*3/uL (ref 150–400)
RBC: 4.16 MIL/uL — ABNORMAL LOW (ref 4.22–5.81)
RDW: 13.2 % (ref 11.5–15.5)
WBC: 9.5 10*3/uL (ref 4.0–10.5)
nRBC: 0 % (ref 0.0–0.2)

## 2018-03-18 LAB — BASIC METABOLIC PANEL
Anion gap: 7 (ref 5–15)
BUN: 11 mg/dL (ref 6–20)
CO2: 28 mmol/L (ref 22–32)
Calcium: 8.8 mg/dL — ABNORMAL LOW (ref 8.9–10.3)
Chloride: 103 mmol/L (ref 98–111)
Creatinine, Ser: 0.69 mg/dL (ref 0.61–1.24)
GFR calc Af Amer: >60 mL/min (ref >60)
GFR calc non Af Amer: >60 mL/min (ref >60)
Glucose, Bld: 110 mg/dL — ABNORMAL HIGH (ref 70–99)
Potassium: 4.1 mmol/L (ref 3.5–5.1)
Sodium: 138 mmol/L (ref 135–145)

## 2018-03-18 LAB — PROTIME-INR
INR: 1.5
Prothrombin Time: 17.9 seconds — ABNORMAL HIGH (ref 11.4–15.2)

## 2018-03-18 MED ORDER — HEPARIN SODIUM (PORCINE) 5000 UNIT/ML IJ SOLN
5000.0000 [IU] | Freq: Three times a day (TID) | INTRAMUSCULAR | Status: DC
  Administered 2018-03-18 – 2018-03-21 (×10): 5000 [IU] via SUBCUTANEOUS
  Filled 2018-03-18 (×10): qty 1

## 2018-03-18 MED ORDER — WARFARIN SODIUM 5 MG PO TABS
10.0000 mg | ORAL_TABLET | Freq: Once | ORAL | Status: AC
  Administered 2018-03-18: 10 mg via ORAL
  Filled 2018-03-18: qty 2

## 2018-03-18 NOTE — Progress Notes (Signed)
ANTICOAGULATION CONSULT NOTE  Pharmacy Consult:  Coumadin Indication:  History of VTE + Factor V Leiden  No Known Allergies  Patient Measurements: Height = 71 inches Weight = 152.9 kg  Vital Signs: Temp: 98.1 F (36.7 C) (02/22 0804) Temp Source: Oral (02/22 0804) BP: 119/79 (02/22 0804) Pulse Rate: 73 (02/22 0804)  Labs: Recent Labs    03/16/18 0508 03/17/18 0419 03/18/18 0251  HGB 12.6* 12.7* 12.1*  HCT 39.3 38.9* 39.0  PLT 310 336 349  LABPROT 21.5* 17.5* 17.9*  INR 1.89 1.45 1.50  CREATININE 0.80  --  0.69    Estimated Creatinine Clearance: 155 mL/min (by C-G formula based on SCr of 0.69 mg/dL).   Assessment: 29 YOM with history of VTE and Factor V Leiden presented with nonhealing wound of the right knee.  Coumadin transitioned to IV heparin for possible surgery; however, no surgery needed and Coumadin to resume today 03/16/18.  INR down to 1.5  Home Coumadin dose:  7.5mg  alternating with 5mg  daily.  Antibiotics continue for cellulitis - > Vancomycin and Zosyn, awaiting culture sensitivities  Goal of Therapy:  INR 2-3 Monitor platelets by anticoagulation protocol: Yes   Plan:  Coumadin 10 mg PO today Daily PT / INR  ? Need for bridging with Lovenox  Thank you Okey Regal, PharmD [phone] 03/18/2018, 11:00 AM

## 2018-03-18 NOTE — Progress Notes (Signed)
PROGRESS NOTE    [name]  WER:[id number] DOB: 06-21-1960 DOA: 03/14/2018 PCP: Simone Curia, MD  Brief Narrative: This is a 58 year old male with history of factor V Leiden mutation, history of DVT/PE on chronic Coumadin, obesity, sleep apnea, depression, history ofLeft ankle fusion in 03/2017, still not able to ambulate and barefoot on his left foot, fell and injured both knees a week ago, subsequently developed an open wound over his right knee, with surrounding redness and drainage went to [facility] emergency room and was admitted overnight subsequently discharged on 2/13 on oral antibiotics he reportedly had a CT scan done there. -Over the past week patient has noted increased redness swelling and discharge from his wound overlying his right knee went to see Dr. Lajoyce Corners who sent him to the emergency room for admission  Assessment & Plan:   Traumatic nectrotic wound over right knee with surrounding cellulitis -CT indicates cellulitis, wound, small hematoma -Clinically improving on IV vancomycin, day 5 now -Ortho  Dr. Lajoyce Corners following underwent surgical debridement of wound yesterday -Coumadin restarted -Wound care  History of PE/DVT/factor V Leiden mutation -Coumadin was held briefly, restarted, INR 1.5 today  -continue Coumadin, add SQ heparin for additional prophylaxis  History of depression -Continue Prozac and Effexor  History of left ankle fusion 3/201 9 -Still with pain and inability to bear weight -Followed by Dr. Lajoyce Corners -Physical therapy consult  Sleep apnea -CPAP nightly  Morbid obesity -BMI of 47 -Needs diet/lifestyle modification  DVT prophylaxis: INR therapeutic Code Status: Full code Family Communication: No family at bedside Disposition Plan: Home pending clinical improvement, likely Monday  Consultants:   Dr.Duda   Procedures:   Antimicrobials:    Subjective: -feels better, pain/redness and swelling improving  Objective: Vitals:   03/18/18 0102  03/18/18 0500 03/18/18 0804 03/18/18 1243  BP: 114/64 115/65 119/79 110/74  Pulse: 66 71 73 76  Resp: 18 18 20 20   Temp: 98.1 F (36.7 C) 98.3 F (36.8 C) 98.1 F (36.7 C) 98.1 F (36.7 C)  TempSrc: Oral Oral Oral Oral  SpO2: 91% 93% 95% 95%  Weight:      Height:        Intake/Output Summary (Last 24 hours) at 03/18/2018 1304 Last data filed at 03/18/2018 1244 Gross per 24 hour  Intake 1806.68 ml  Output 3990 ml  Net -2183.32 ml   Filed Weights   03/17/18 1132  Weight: (!) 152.9 kg    Examination: Gen: Awake, Alert, Oriented X 3, obese male, no distress HEENT: PERRLA, Neck supple, no JVD Lungs: CTAB CVS: RRR,No Gallops,Rubs or new Murmurs Abd: soft, Non tender, non distended, BS present Extremities:  Right knee with open wound, some purulence, redness and surrounding tenderness is improving, left ankle status post fusion, limited range of motion Skin: as above Psychiatry: Appropriate mood and affect    Data Reviewed:   CBC: Recent Labs  Lab 03/14/18 1358 03/15/18 0624 03/16/18 0508 03/17/18 0419 03/18/18 0251  WBC 9.8 8.2 7.4 5.4 9.5  NEUTROABS 6.4  --   --   --   --   HGB 13.1 12.8* 12.6* 12.7* 12.1*  HCT 42.2 39.6 39.3 38.9* 39.0  MCV 94.8 92.7 94.0 94.2 93.8  PLT 331 313 310 336 349   Basic Metabolic Panel: Recent Labs  Lab 03/14/18 1358 03/15/18 0624 03/16/18 0508 03/18/18 0251  NA 137 139 141 138  K 4.0 4.1 4.6 4.1  CL 104 103 108 103  CO2 24 27 22 28   GLUCOSE 88  94 115* 110*  BUN <5* 6 5* 11  CREATININE 0.81 0.88 0.80 0.69  CALCIUM 9.3 8.9 8.7* 8.8*   GFR: Estimated Creatinine Clearance: 155 mL/min (by C-G formula based on SCr of 0.69 mg/dL). Liver Function Tests: Recent Labs  Lab 03/14/18 1358  AST 84*  ALT 55*  ALKPHOS 62  BILITOT 0.6  PROT 7.1  ALBUMIN 3.0*   No results for input(s): LIPASE, AMYLASE in the last 168 hours. No results for input(s): AMMONIA in the last 168 hours. Coagulation Profile: Recent Labs  Lab  03/14/18 2100 03/15/18 0624 03/16/18 0508 03/17/18 0419 03/18/18 0251  INR 2.82 2.89 1.89 1.45 1.50   Cardiac Enzymes: No results for input(s): CKTOTAL, CKMB, CKMBINDEX, TROPONINI in the last 168 hours. BNP (last 3 results) No results for input(s): PROBNP in the last 8760 hours. HbA1C: No results for input(s): HGBA1C in the last 72 hours. CBG: No results for input(s): GLUCAP in the last 168 hours. Lipid Profile: No results for input(s): CHOL, HDL, LDLCALC, TRIG, CHOLHDL, LDLDIRECT in the last 72 hours. Thyroid Function Tests: No results for input(s): TSH, T4TOTAL, FREET4, T3FREE, THYROIDAB in the last 72 hours. Anemia Panel: No results for input(s): VITAMINB12, FOLATE, FERRITIN, TIBC, IRON, RETICCTPCT in the last 72 hours. Urine analysis:    Component Value Date/Time   COLORURINE COLORLESS (A) 03/17/2018 0013   APPEARANCEUR CLEAR 03/17/2018 0013   LABSPEC 1.005 03/17/2018 0013   PHURINE 7.0 03/17/2018 0013   GLUCOSEU NEGATIVE 03/17/2018 0013   HGBUR NEGATIVE 03/17/2018 0013   BILIRUBINUR NEGATIVE 03/17/2018 0013   KETONESUR NEGATIVE 03/17/2018 0013   PROTEINUR NEGATIVE 03/17/2018 0013   UROBILINOGEN 0.2 07/25/2007 0940   NITRITE NEGATIVE 03/17/2018 0013   LEUKOCYTESUR NEGATIVE 03/17/2018 0013   Sepsis Labs: @LABRCNTIP (procalcitonin:4,lacticidven:4)  ) Recent Results (from the past 240 hour(s))  Blood Culture (routine x 2)     Status: None (Preliminary result)   Collection Time: 03/14/18  8:50 PM  Result Value Ref Range Status   Specimen Description BLOOD LEFT HAND  Final   Special Requests   Final    BOTTLES DRAWN AEROBIC AND ANAEROBIC Blood Culture results may not be optimal due to an inadequate volume of blood received in culture bottles Performed at [facility], [address] [place], [place] [address]    Culture NO GROWTH 3 DAYS  Final   Report Status PENDING  Incomplete  Blood Culture (routine x 2)     Status: None (Preliminary result)   Collection  Time: 03/14/18  8:57 PM  Result Value Ref Range Status   Specimen Description BLOOD LEFT WRIST  Final   Special Requests   Final    BOTTLES DRAWN AEROBIC AND ANAEROBIC Blood Culture results may not be optimal due to an inadequate volume of blood received in culture bottles Performed at [facility], [address], [place], [place] [address]    Culture NO GROWTH 3 DAYS  Final   Report Status PENDING  Incomplete  Surgical pcr screen     Status: None   Collection Time: 03/16/18  6:42 PM  Result Value Ref Range Status   MRSA, PCR NEGATIVE NEGATIVE Final   Staphylococcus aureus NEGATIVE NEGATIVE Final    Comment: (NOTE) The Xpert SA Assay (FDA approved for NASAL specimens in patients 27 years of age and older), is one component of a comprehensive surveillance program. It is not intended to diagnose infection nor to guide or monitor treatment. Performed at [facility], [address] [place], [place] [address]   Aerobic/Anaerobic Culture (surgical/deep wound)     Status: None (Preliminary result)   Collection Time: 03/17/18  1:54 PM  Result Value Ref Range Status   Specimen Description WOUND RIGHT KNEE  Final   Special Requests NONE  Final   Gram Stain   Final    ABUNDANT WBC PRESENT,BOTH PMN AND MONONUCLEAR RARE GRAM POSITIVE COCCI Performed at [facility], [address], [place], [place] [address]    Culture CULTURE REINCUBATED FOR BETTER GROWTH  Final   Report Status PENDING  Incomplete         Radiology Studies: No results found.      Scheduled Meds: . atorvastatin  20 mg Oral q1800  . docusate sodium  100 mg Oral BID  . FLUoxetine  80 mg Oral Daily  . gabapentin  800 mg Oral Daily  . gabapentin  1,200 mg Oral QHS  . heparin injection (subcutaneous)  5,000 Units Subcutaneous Q8H  . traZODone  200 mg Oral QHS  . venlafaxine XR  225 mg Oral Q breakfast  . warfarin  10 mg Oral ONCE-1800  . Warfarin - Pharmacist Dosing Inpatient   Does not  apply q1800   Continuous Infusions: . sodium chloride    . cefTRIAXone (ROCEPHIN)  IV Stopped (03/17/18 2053)  . methocarbamol (ROBAXIN) IV    . vancomycin 1,500 mg (03/18/18 0942)     LOS: 4 days    Time spent:    Zannie Cove, MD [facility]   03/18/2018, 1:04 PM

## 2018-03-18 NOTE — Progress Notes (Signed)
Patient is stable today.  Pain is well controlled.  Surgical dressing is c/d/i.  Continue abx per culture sensitivities.  WBAT RLE.  Up with PT.  May d/c home on Monday once sensitivities return.

## 2018-03-18 NOTE — Progress Notes (Signed)
Physical Therapy Evaluation Patient Details Name: [name] MRN: [id number] DOB: 1961-01-03 Today's Date: 03/18/2018   History of Present Illness  Patient is a 58 y/o male who presents with cellulitis of right knee with non healing wound. Plan for I&D right knee 2/21. 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  Clinical Impression  Patient appears to be at baseline mobility. The surgical procedure on his right LE does not appear to be effecting his left LE. He continues to have the same amount of pain. He was independent with all mobility in his room and was able to ambaulte household distances with therapy. He is falling when he is using his scooter. Therapy reviewed some other options as far as knee weight bearing devices. He is aware of these options. If he chooses another device he may benefit from outpatient therapy to work on safety with the device. If he does not then he is likely at his baseline.     Follow Up Recommendations No PT follow up;Outpatient PT(If patient decideds to change ihis decvice he is using )    Equipment Recommendations       Recommendations for Other Services       Precautions / Restrictions Precautions Precautions: Fall Precaution Comments: falls freqeuntly on his scooter Restrictions Weight Bearing Restrictions: Yes RLE Weight Bearing: Weight bearing as tolerated      Mobility  Bed Mobility Overal bed mobility: Modified Independent             General bed mobility comments: no assist needed.   Transfers Overall transfer level: Modified independent Equipment used: None             General transfer comment: Patient stood with a walker and with nothing. He had no difficulty. He did not have increased right knee pain. His left heel pain was the same as it has been.   Ambulation/Gait Ambulation/Gait assistance: Supervision Gait Distance (Feet): 25  Feet Assistive device: Rolling walker (2 wheeled) Gait Pattern/deviations: Step-to pattern;Decreased stance time - left;Decreased weight shift to left Gait velocity: decreased Gait velocity interpretation: <1.31 ft/sec, indicative of household ambulator General Gait Details: slow but steady gait pattenr. No LOB or increase in pain   Stairs            Wheelchair Mobility    Modified Rankin (Stroke Patients Only)       Balance Overall balance assessment: Needs assistance;History of Falls Sitting-balance support: Feet supported;No upper extremity supported Sitting balance-Leahy Scale: Good     Standing balance support: During functional activity;Bilateral upper extremity supported Standing balance-Leahy Scale: Poor Standing balance comment: Able to stand statically without UE support, requires UE support for ambulation.                             Pertinent Vitals/Pain Pain Assessment: Faces Faces Pain Scale: Hurts a little bit Pain Location: left ankle Pain Descriptors / Indicators: Sore;Sharp Pain Intervention(s): Limited activity within patient's tolerance    Home Living Family/patient expects to be discharged to:: Private residence Living Arrangements: Alone   Type of Home: Apartment Home Access: Stairs to enter   [employer] of Steps: 1 Home Layout: One level Home Equipment: Environmental consultant - 2 wheels;Cane - single point;Other (comment) Additional Comments: knee scooter    Prior Function Level of Independence: Independent with assistive device(s)         Comments: was using knee  scooter at home but has frequent falls      Hand Dominance   Dominant Hand: Right    Extremity/Trunk Assessment   Upper Extremity Assessment Upper Extremity Assessment: Overall WFL for tasks assessed    Lower Extremity Assessment Lower Extremity Assessment: Generalized weakness;LLE deficits/detail RLE Deficits / Details: Wound bandaged and not visible.  Grossly ~4/5 throughout LLE Deficits / Details: no ankle AROM secondary to fusion       Communication   Communication: No difficulties  Cognition Arousal/Alertness: Awake/alert Behavior During Therapy: WFL for tasks assessed/performed Overall Cognitive Status: No family/caregiver present to determine baseline cognitive functioning                                 General Comments: Seems WFL for basic mobility tasks. Pt frustrated about current situation- not finding solution to left ankle pain, stressors related to finances and almost eviction. Wants to get better and asking for help.      General Comments      Exercises     Assessment/Plan    PT Assessment All further PT needs can be met in the next venue of care  PT Problem List Decreased strength;Decreased mobility;Obesity;Decreased activity tolerance;Pain;Decreased balance       PT Treatment Interventions      PT Goals (Current goals can be found in the Care Plan section)  Acute Rehab PT Goals Patient Stated Goal: to make this pain less and be able to get around PT Goal Formulation: With patient Time For Goal Achievement: 03/31/18 Potential to Achieve Goals: Good    Frequency     Barriers to discharge Decreased caregiver support lives alone     Co-evaluation               AM-PAC PT "6 Clicks" Mobility  Outcome Measure Help needed turning from your back to your side while in a flat bed without using bedrails?: None Help needed moving from lying on your back to sitting on the side of a flat bed without using bedrails?: None Help needed moving to and from a bed to a chair (including a wheelchair)?: None Help needed standing up from a chair using your arms (e.g., wheelchair or bedside chair)?: A Little Help needed to walk in hospital room?: A Little Help needed climbing 3-5 steps with a railing? : A Lot 6 Click Score: 20    End of Session Equipment Utilized During Treatment: Gait  belt Activity Tolerance: Patient tolerated treatment well Patient left: in bed;with call bell/phone within reach;Other (comment) Nurse Communication: Mobility status PT Visit Diagnosis: Pain;Difficulty in walking, not elsewhere classified (R26.2);Muscle weakness (generalized) (M62.81);Unsteadiness on feet (R26.81) Pain - Right/Left: Left Pain - part of body: Ankle and joints of foot    Time: 0930-1000 PT Time Calculation (min) (ACUTE ONLY): 30 min   Charges:   PT Evaluation $PT Re-evaluation: 1 Re-eval           Carney Living PT DPT  03/18/2018, 10:13 AM

## 2018-03-19 LAB — CULTURE, BLOOD (ROUTINE X 2)
Culture: NO GROWTH
Culture: NO GROWTH

## 2018-03-19 LAB — CBC
HCT: 40.2 % (ref 39.0–52.0)
HEMOGLOBIN: 12.8 g/dL — AB (ref 13.0–17.0)
MCH: 30 pg (ref 26.0–34.0)
MCHC: 31.8 g/dL (ref 30.0–36.0)
MCV: 94.4 fL (ref 80.0–100.0)
Platelets: 363 10*3/uL (ref 150–400)
RBC: 4.26 MIL/uL (ref 4.22–5.81)
RDW: 13.5 % (ref 11.5–15.5)
WBC: 6.1 10*3/uL (ref 4.0–10.5)
nRBC: 0 % (ref 0.0–0.2)

## 2018-03-19 LAB — HEMOGLOBIN A1C
Hgb A1c MFr Bld: 5.5 % (ref 4.8–5.6)
Mean Plasma Glucose: 111.15 mg/dL

## 2018-03-19 LAB — PROTIME-INR
INR: 1.6
Prothrombin Time: 18.9 seconds — ABNORMAL HIGH (ref 11.4–15.2)

## 2018-03-19 MED ORDER — WARFARIN SODIUM 5 MG PO TABS
10.0000 mg | ORAL_TABLET | Freq: Once | ORAL | Status: AC
  Administered 2018-03-19: 10 mg via ORAL
  Filled 2018-03-19: qty 2

## 2018-03-19 NOTE — Progress Notes (Signed)
Pt picking at scab on left knee. RN asked him not to do this as could lead to infection. He verbalized understanding. Area cleaned with soap and water.

## 2018-03-19 NOTE — Progress Notes (Signed)
ANTICOAGULATION CONSULT NOTE  Pharmacy Consult:  Coumadin Indication:  History of VTE + Factor V Leiden  No Known Allergies  Patient Measurements: Height = 71 inches Weight = 152.9 kg  Vital Signs: Temp: 97.5 F (36.4 C) (02/23 0344) Temp Source: Axillary (02/23 0344) BP: 112/64 (02/23 0344) Pulse Rate: 66 (02/23 0344)  Labs: Recent Labs    03/17/18 0419 03/18/18 0251 03/19/18 0431  HGB 12.7* 12.1* 12.8*  HCT 38.9* 39.0 40.2  PLT 336 349 363  LABPROT 17.5* 17.9* 18.9*  INR 1.45 1.50 1.60  CREATININE  --  0.69  --     Estimated Creatinine Clearance: 155 mL/min (by C-G formula based on SCr of 0.69 mg/dL).   Assessment: 53 YOM with history of VTE and Factor V Leiden presented with nonhealing wound of the right knee.  Coumadin transitioned to IV heparin for possible surgery; however, no surgery needed and Coumadin to resume today 03/16/18.  INR down to 1.6  Home Coumadin dose:  7.5mg  alternating with 5mg  daily.  Goal of Therapy:  INR 2-3 Monitor platelets by anticoagulation protocol: Yes   Plan:  Repeat Coumadin 10 mg PO today Daily PT / INR  Thank you Okey Regal, PharmD [phone] 03/19/2018, 12:24 PM

## 2018-03-19 NOTE — Progress Notes (Signed)
PROGRESS NOTE    [name]  ZOX:[id number] DOB: 1960-04-07 DOA: 03/14/2018 PCP: Simone Curia, MD  Brief Narrative: This is a 58 year old male with history of factor V Leiden mutation, history of DVT/PE on chronic Coumadin, obesity, sleep apnea, depression, history ofLeft ankle fusion in 03/2017, still not able to ambulate and barefoot on his left foot, fell and injured both knees a week ago, subsequently developed an open wound over his right knee, with surrounding redness and drainage went to [facility] emergency room and was admitted overnight subsequently discharged on 2/13 on oral antibiotics he reportedly had a CT scan done there. -Over the past week patient has noted increased redness swelling and discharge from his wound overlying his right knee went to see Dr. Lajoyce Corners who sent him to the emergency room for admission  Assessment & Plan:   Traumatic nectrotic wound over right knee with surrounding cellulitis -CT indicates cellulitis, wound, small hematoma -Clinically improving, remains on IV vancomycin will transition to oral antibiotics tomorrow, follow-up cultures -Ortho  Dr. Lajoyce Corners following underwent surgical debridement of wound 2/21 -Coumadin restarted -Wound care  Remote history of PE/DVT/factor V Leiden mutation -Coumadin was held briefly, Coumadin restarted, INR is 1.6 today, -Also continue SQ heparin for additional prophylaxis  History of depression -Continue Prozac and Effexor  History of left ankle fusion 3/201 9 -Still with pain and inability to bear weight -Followed by Dr. Lajoyce Corners -Physical therapy consult  Sleep apnea -CPAP nightly  Morbid obesity -BMI of 47 -Needs diet/lifestyle modification  DVT prophylaxis: INR therapeutic Code Status: Full code Family Communication: No family at bedside Disposition Plan: Home tomorrow if stable with home health services  Consultants:   Dr.Duda   Procedures:   Antimicrobials:    Subjective: -Feels well overall,  anxious to know how his right knee looks  Objective: Vitals:   03/18/18 0804 03/18/18 1243 03/18/18 2027 03/19/18 0344  BP: 119/79 110/74 (!) 109/43 112/64  Pulse: 73 76 66 66  Resp: 20 20    Temp: 98.1 F (36.7 C) 98.1 F (36.7 C) 97.9 F (36.6 C) (!) 97.5 F (36.4 C)  TempSrc: Oral Oral Oral Axillary  SpO2: 95% 95% 94% 90%  Weight:      Height:        Intake/Output Summary (Last 24 hours) at 03/19/2018 1037 Last data filed at 03/19/2018 0800 Gross per 24 hour  Intake 980 ml  Output 2875 ml  Net -1895 ml   Filed Weights   03/17/18 1132  Weight: (!) 152.9 kg    Examination: Gen: Awake, Alert, Oriented X 3, obese male, no distress HEENT: PERRLA, Neck supple, no JVD Lungs: Good air movement bilaterally, CTAB CVS: RRR,No Gallops,Rubs or new Murmurs Abd: soft, Non tender, non distended, BS present Extremities: Right knee with dressing, left ankle status post fusion with limited range of motion, dry scabs over left knee Skin: no new rashes Psychiatry: Appropriate mood and affect    Data Reviewed:   CBC: Recent Labs  Lab 03/14/18 1358 03/15/18 0624 03/16/18 0508 03/17/18 0419 03/18/18 0251 03/19/18 0431  WBC 9.8 8.2 7.4 5.4 9.5 6.1  NEUTROABS 6.4  --   --   --   --   --   HGB 13.1 12.8* 12.6* 12.7* 12.1* 12.8*  HCT 42.2 39.6 39.3 38.9* 39.0 40.2  MCV 94.8 92.7 94.0 94.2 93.8 94.4  PLT 331 313 310 336 349 363   Basic Metabolic Panel: Recent Labs  Lab 03/14/18 1358 03/15/18 0624 03/16/18 0508 03/18/18 0251  NA 137 139 141 138  K 4.0 4.1 4.6 4.1  CL 104 103 108 103  CO2 24 27 22 28   GLUCOSE 88 94 115* 110*  BUN <5* 6 5* 11  CREATININE 0.81 0.88 0.80 0.69  CALCIUM 9.3 8.9 8.7* 8.8*   GFR: Estimated Creatinine Clearance: 155 mL/min (by C-G formula based on SCr of 0.69 mg/dL). Liver Function Tests: Recent Labs  Lab 03/14/18 1358  AST 84*  ALT 55*  ALKPHOS 62  BILITOT 0.6  PROT 7.1  ALBUMIN 3.0*   No results for input(s): LIPASE, AMYLASE in  the last 168 hours. No results for input(s): AMMONIA in the last 168 hours. Coagulation Profile: Recent Labs  Lab 03/15/18 0624 03/16/18 0508 03/17/18 0419 03/18/18 0251 03/19/18 0431  INR 2.89 1.89 1.45 1.50 1.60   Cardiac Enzymes: No results for input(s): CKTOTAL, CKMB, CKMBINDEX, TROPONINI in the last 168 hours. BNP (last 3 results) No results for input(s): PROBNP in the last 8760 hours. HbA1C: Recent Labs    03/19/18 0431  HGBA1C 5.5   CBG: No results for input(s): GLUCAP in the last 168 hours. Lipid Profile: No results for input(s): CHOL, HDL, LDLCALC, TRIG, CHOLHDL, LDLDIRECT in the last 72 hours. Thyroid Function Tests: No results for input(s): TSH, T4TOTAL, FREET4, T3FREE, THYROIDAB in the last 72 hours. Anemia Panel: No results for input(s): VITAMINB12, FOLATE, FERRITIN, TIBC, IRON, RETICCTPCT in the last 72 hours. Urine analysis:    Component Value Date/Time   COLORURINE COLORLESS (A) 03/17/2018 0013   APPEARANCEUR CLEAR 03/17/2018 0013   LABSPEC 1.005 03/17/2018 0013   PHURINE 7.0 03/17/2018 0013   GLUCOSEU NEGATIVE 03/17/2018 0013   HGBUR NEGATIVE 03/17/2018 0013   BILIRUBINUR NEGATIVE 03/17/2018 0013   KETONESUR NEGATIVE 03/17/2018 0013   PROTEINUR NEGATIVE 03/17/2018 0013   UROBILINOGEN 0.2 07/25/2007 0940   NITRITE NEGATIVE 03/17/2018 0013   LEUKOCYTESUR NEGATIVE 03/17/2018 0013   Sepsis Labs: @LABRCNTIP (procalcitonin:4,lacticidven:4)  ) Recent Results (from the past 240 hour(s))  Blood Culture (routine x 2)     Status: None   Collection Time: 03/14/18  8:50 PM  Result Value Ref Range Status   Specimen Description BLOOD LEFT HAND  Final   Special Requests   Final    BOTTLES DRAWN AEROBIC AND ANAEROBIC Blood Culture results may not be optimal due to an inadequate volume of blood received in culture bottles Performed at [facility], [address], [place], [place] [address]    Culture NO GROWTH 5 DAYS  Final   Report Status 03/19/2018  FINAL  Final  Blood Culture (routine x 2)     Status: None   Collection Time: 03/14/18  8:57 PM  Result Value Ref Range Status   Specimen Description BLOOD LEFT WRIST  Final   Special Requests   Final    BOTTLES DRAWN AEROBIC AND ANAEROBIC Blood Culture results may not be optimal due to an inadequate volume of blood received in culture bottles Performed at [facility], [address], [place], [place] [address]    Culture NO GROWTH 5 DAYS  Final   Report Status 03/19/2018 FINAL  Final  Surgical pcr screen     Status: None   Collection Time: 03/16/18  6:42 PM  Result Value Ref Range Status   MRSA, PCR NEGATIVE NEGATIVE Final   Staphylococcus aureus NEGATIVE NEGATIVE Final    Comment: (NOTE) The Xpert SA Assay (FDA approved for NASAL specimens in patients 51 years of age and older), is one  component of a comprehensive surveillance program. It is not intended to diagnose infection nor to guide or monitor treatment. Performed at [facility], [address] [place], [place] [address]   Aerobic/Anaerobic Culture (surgical/deep wound)     Status: None (Preliminary result)   Collection Time: 03/17/18  1:54 PM  Result Value Ref Range Status   Specimen Description WOUND RIGHT KNEE  Final   Special Requests NONE  Final   Gram Stain   Final    ABUNDANT WBC PRESENT,BOTH PMN AND MONONUCLEAR RARE GRAM POSITIVE COCCI Performed at [facility], [address], [place], [place] [address]    Culture CULTURE REINCUBATED FOR BETTER GROWTH  Final   Report Status PENDING  Incomplete         Radiology Studies: No results found.      Scheduled Meds: . atorvastatin  20 mg Oral q1800  . docusate sodium  100 mg Oral BID  . FLUoxetine  80 mg Oral Daily  . gabapentin  800 mg Oral Daily  . gabapentin  1,200 mg Oral QHS  . heparin injection (subcutaneous)  5,000 Units Subcutaneous Q8H  . traZODone  200 mg Oral QHS  . venlafaxine XR  225 mg Oral Q breakfast  . Warfarin -  Pharmacist Dosing Inpatient   Does not apply q1800   Continuous Infusions: . sodium chloride    . methocarbamol (ROBAXIN) IV    . vancomycin 1,500 mg (03/19/18 0821)     LOS: 5 days    Time spent:    Zannie Cove, MD [facility]   03/19/2018, 10:37 AM

## 2018-03-20 LAB — CBC
HCT: 41.7 % (ref 39.0–52.0)
HEMOGLOBIN: 13.2 g/dL (ref 13.0–17.0)
MCH: 29.7 pg (ref 26.0–34.0)
MCHC: 31.7 g/dL (ref 30.0–36.0)
MCV: 93.7 fL (ref 80.0–100.0)
Platelets: 371 10*3/uL (ref 150–400)
RBC: 4.45 MIL/uL (ref 4.22–5.81)
RDW: 13.5 % (ref 11.5–15.5)
WBC: 6.6 10*3/uL (ref 4.0–10.5)
nRBC: 0 % (ref 0.0–0.2)

## 2018-03-20 LAB — PROTIME-INR
INR: 1.51
Prothrombin Time: 18 seconds — ABNORMAL HIGH (ref 11.4–15.2)

## 2018-03-20 MED ORDER — WARFARIN SODIUM 7.5 MG PO TABS
12.5000 mg | ORAL_TABLET | Freq: Once | ORAL | Status: AC
  Administered 2018-03-20: 12.5 mg via ORAL
  Filled 2018-03-20: qty 1

## 2018-03-20 NOTE — Care Management Important Message (Signed)
Important Message  Patient Details  Name: [name] MRN: [id number] Date of Birth: Jul 04, 1960   Medicare Important Message Given:  Yes    Scarlet Abad Stefan Church 03/20/2018, 4:24 PM

## 2018-03-20 NOTE — Progress Notes (Signed)
Patient ID: [name], male   DOB: 09/26/1960, 58 y.o.   MRN: [id number] Patient is status post irrigation and debridement abscess right knee.  Gram stain is positive for gram-positive cocci the culture sensitivities are pending.  Anticipate patient can be discharged on oral antibiotics once sensitivities are available.  Patient is ambulating independently however he occasionally has some sharp pain in the left forefoot.

## 2018-03-20 NOTE — Progress Notes (Signed)
PROGRESS NOTE    [name]  AJG:[id number] DOB: 08/27/1960 DOA: 03/14/2018 PCP: Simone Curia, MD  Brief Narrative: This is a 58 year old male with history of factor V Leiden mutation, history of DVT/PE on chronic Coumadin, obesity, sleep apnea, depression, history ofLeft ankle fusion in 03/2017, still not able to ambulate and barefoot on his left foot, fell and injured both knees a week ago, subsequently developed an open wound over his right knee, with surrounding redness and drainage went to [facility] emergency room and was admitted overnight subsequently discharged on 2/13 on oral antibiotics he reportedly had a CT scan done there. -Over the past week patient has noted increased redness swelling and discharge from his wound overlying his right knee went to see Dr. Lajoyce Corners who sent him to the emergency room for admission  Assessment & Plan:   Traumatic nectrotic wound over right knee with surrounding cellulitis -CT indicates cellulitis, wound, small hematoma -Ortho  Dr. Lajoyce Corners following underwent surgical debridement of wound 2/21 -Clinically improving, remains on IV vancomycin, deep wound culture with rare GPC on Gram stain culture pending, will plan to transition to oral antibiotics soon -We will discussed with Dr. Lajoyce Corners regarding wound care  Remote history of PE/DVT/factor V Leiden mutation -Coumadin was held briefly, Coumadin restarted, INR is 1.5 today, -Also continue SQ heparin for additional prophylaxis  History of depression -Continue Prozac and Effexor  History of left ankle fusion 3/201 9 -Still with pain and inability to bear weight -Followed by Dr. Lajoyce Corners -Physical therapy consult  Sleep apnea -CPAP nightly  Morbid obesity -BMI of 47 -Needs diet/lifestyle modification  DVT prophylaxis: INR therapeutic Code Status: Full code Family Communication: No family at bedside Disposition Plan: Plan for home tomorrow, pending wound cultures, home health nursing  Consultants:    Dr.Duda   Procedures:   Antimicrobials:    Subjective: -Improving, wound appears to be healing better  Objective: Vitals:   03/19/18 0344 03/19/18 1346 03/19/18 1918 03/20/18 0258  BP: 112/64 (!) 115/58 (!) 112/50 94/61  Pulse: 66 73 78 61  Resp:  16    Temp: (!) 97.5 F (36.4 C) 98.1 F (36.7 C) 97.9 F (36.6 C) (!) 97.4 F (36.3 C)  TempSrc: Axillary Oral Oral Oral  SpO2: 90% 93% 95% 91%  Weight:      Height:        Intake/Output Summary (Last 24 hours) at 03/20/2018 1345 Last data filed at 03/20/2018 1300 Gross per 24 hour  Intake 720 ml  Output 4901 ml  Net -4181 ml   Filed Weights   03/17/18 1132  Weight: (!) 152.9 kg    Examination: Gen: Awake, Alert, Oriented X 3, obese male, no distress HEENT: PERRLA, Neck supple, no JVD Lungs: Clear bilaterally CVS: RRR,No Gallops,Rubs or new Murmurs Abd: soft, Non tender, non distended, BS present Extremities: Right knee with dressing, left ankle status post fusion and limited range of motion, dry scabs over left knee Skin: no new rashes Psychiatry: Appropriate mood and affect    Data Reviewed:   CBC: Recent Labs  Lab 03/14/18 1358  03/16/18 0508 03/17/18 0419 03/18/18 0251 03/19/18 0431 03/20/18 0245  WBC 9.8   < > 7.4 5.4 9.5 6.1 6.6  NEUTROABS 6.4  --   --   --   --   --   --   HGB 13.1   < > 12.6* 12.7* 12.1* 12.8* 13.2  HCT 42.2   < > 39.3 38.9* 39.0 40.2 41.7  MCV 94.8   < >  94.0 94.2 93.8 94.4 93.7  PLT 331   < > 310 336 349 363 371   < > = values in this interval not displayed.   Basic Metabolic Panel: Recent Labs  Lab 03/14/18 1358 03/15/18 0624 03/16/18 0508 03/18/18 0251  NA 137 139 141 138  K 4.0 4.1 4.6 4.1  CL 104 103 108 103  CO2 24 27 22 28   GLUCOSE 88 94 115* 110*  BUN <5* 6 5* 11  CREATININE 0.81 0.88 0.80 0.69  CALCIUM 9.3 8.9 8.7* 8.8*   GFR: Estimated Creatinine Clearance: 155 mL/min (by C-G formula based on SCr of 0.69 mg/dL). Liver Function Tests: Recent Labs   Lab 03/14/18 1358  AST 84*  ALT 55*  ALKPHOS 62  BILITOT 0.6  PROT 7.1  ALBUMIN 3.0*   No results for input(s): LIPASE, AMYLASE in the last 168 hours. No results for input(s): AMMONIA in the last 168 hours. Coagulation Profile: Recent Labs  Lab 03/16/18 0508 03/17/18 0419 03/18/18 0251 03/19/18 0431 03/20/18 0245  INR 1.89 1.45 1.50 1.60 1.51   Cardiac Enzymes: No results for input(s): CKTOTAL, CKMB, CKMBINDEX, TROPONINI in the last 168 hours. BNP (last 3 results) No results for input(s): PROBNP in the last 8760 hours. HbA1C: Recent Labs    03/19/18 0431  HGBA1C 5.5   CBG: No results for input(s): GLUCAP in the last 168 hours. Lipid Profile: No results for input(s): CHOL, HDL, LDLCALC, TRIG, CHOLHDL, LDLDIRECT in the last 72 hours. Thyroid Function Tests: No results for input(s): TSH, T4TOTAL, FREET4, T3FREE, THYROIDAB in the last 72 hours. Anemia Panel: No results for input(s): VITAMINB12, FOLATE, FERRITIN, TIBC, IRON, RETICCTPCT in the last 72 hours. Urine analysis:    Component Value Date/Time   COLORURINE COLORLESS (A) 03/17/2018 0013   APPEARANCEUR CLEAR 03/17/2018 0013   LABSPEC 1.005 03/17/2018 0013   PHURINE 7.0 03/17/2018 0013   GLUCOSEU NEGATIVE 03/17/2018 0013   HGBUR NEGATIVE 03/17/2018 0013   BILIRUBINUR NEGATIVE 03/17/2018 0013   KETONESUR NEGATIVE 03/17/2018 0013   PROTEINUR NEGATIVE 03/17/2018 0013   UROBILINOGEN 0.2 07/25/2007 0940   NITRITE NEGATIVE 03/17/2018 0013   LEUKOCYTESUR NEGATIVE 03/17/2018 0013   Sepsis Labs: @LABRCNTIP (procalcitonin:4,lacticidven:4)  ) Recent Results (from the past 240 hour(s))  Blood Culture (routine x 2)     Status: None   Collection Time: 03/14/18  8:50 PM  Result Value Ref Range Status   Specimen Description BLOOD LEFT HAND  Final   Special Requests   Final    BOTTLES DRAWN AEROBIC AND ANAEROBIC Blood Culture results may not be optimal due to an inadequate volume of blood received in culture  bottles Performed at [facility], [address], [place], [place] [address]    Culture NO GROWTH 5 DAYS  Final   Report Status 03/19/2018 FINAL  Final  Blood Culture (routine x 2)     Status: None   Collection Time: 03/14/18  8:57 PM  Result Value Ref Range Status   Specimen Description BLOOD LEFT WRIST  Final   Special Requests   Final    BOTTLES DRAWN AEROBIC AND ANAEROBIC Blood Culture results may not be optimal due to an inadequate volume of blood received in culture bottles Performed at [facility], [address] [place], [place] [address]    Culture NO GROWTH 5 DAYS  Final   Report Status 03/19/2018 FINAL  Final  Surgical pcr screen     Status: None   Collection Time: 03/16/18  6:42 PM  Result Value Ref Range Status   MRSA, PCR NEGATIVE NEGATIVE Final   Staphylococcus aureus NEGATIVE NEGATIVE Final    Comment: (NOTE) The Xpert SA Assay (FDA approved for NASAL specimens in patients 70 years of age and older), is one component of a comprehensive surveillance program. It is not intended to diagnose infection nor to guide or monitor treatment. Performed at [facility], [address] [place], [place] [address]   Aerobic/Anaerobic Culture (surgical/deep wound)     Status: None (Preliminary result)   Collection Time: 03/17/18  1:54 PM  Result Value Ref Range Status   Specimen Description WOUND RIGHT KNEE  Final   Special Requests NONE  Final   Gram Stain   Final    ABUNDANT WBC PRESENT,BOTH PMN AND MONONUCLEAR RARE GRAM POSITIVE COCCI    Culture   Final    HOLDING FOR POSSIBLE ANAEROBE Performed at [facility], [address], [place], [place] [address]    Report Status PENDING  Incomplete         Radiology Studies: No results found.      Scheduled Meds: . atorvastatin  20 mg Oral q1800  . docusate sodium  100 mg Oral BID  . FLUoxetine  80 mg Oral Daily  . gabapentin  800 mg Oral Daily  . gabapentin  1,200 mg Oral QHS  .  heparin injection (subcutaneous)  5,000 Units Subcutaneous Q8H  . traZODone  200 mg Oral QHS  . venlafaxine XR  225 mg Oral Q breakfast  . warfarin  12.5 mg Oral ONCE-1800  . Warfarin - Pharmacist Dosing Inpatient   Does not apply q1800   Continuous Infusions: . sodium chloride    . methocarbamol (ROBAXIN) IV    . vancomycin 1,500 mg (03/20/18 0833)     LOS: 6 days    Time spent:    Zannie Cove, MD [facility]   03/20/2018, 1:45 PM

## 2018-03-20 NOTE — Plan of Care (Signed)

## 2018-03-20 NOTE — Progress Notes (Signed)
ANTICOAGULATION & ANTIBIOTIC CONSULT NOTE  Pharmacy Consult:  Coumadin + Vancomycin Indication:  History of VTE/Factor V Leiden + Cellulitis  No Known Allergies  Patient Measurements: Height = 71 inches Weight = 152.9 kg  Vital Signs: Temp: 97.4 F (36.3 C) (02/24 0258) Temp Source: Oral (02/24 0258) BP: 94/61 (02/24 0258) Pulse Rate: 61 (02/24 0258)  Labs: Recent Labs    03/18/18 0251 03/19/18 0431 03/20/18 0245  HGB 12.1* 12.8* 13.2  HCT 39.0 40.2 41.7  PLT 349 363 371  LABPROT 17.9* 18.9* 18.0*  INR 1.50 1.60 1.51  CREATININE 0.69  --   --     Estimated Creatinine Clearance: 155 mL/min (by C-G formula based on SCr of 0.69 mg/dL).   Assessment: 64 YOM with history of VTE and Factor V Leiden presented with nonhealing wound of the right knee.  Coumadin transitioned to IV heparin for possible surgery; however, no surgery needed and Coumadin resumed on 03/16/18.  INR sub-therapeutic and trending down; no bleeding reported.  Noted 03/15/18 CT showed hematoma; no expansion documented.  Home Coumadin dose:  7.5mg  alternating with 5mg  daily.  Pharmacy also consulted to dose vancomycin for cellulitis and possible abscess.  He is s/p I&D on 03/17/18.  Renal function is stable.  Afebrile, WBC WNL.  Goal of Therapy:  INR 2-3 Monitor platelets by anticoagulation protocol: Yes  Vanc AUC 400-550   Plan:  Coumadin 12.5mg  PO today Heparin 5000 units SQ Q8H until INR therapeutic Daily PT / INR  Continue vanc 1500mg  IV Q12H Monitor renal fxn, clinical progress BMET in AM Hold off on vanc AUC since planning on oral abx once culture finalizes   Syana Degraffenreid D. Laney Potash, PharmD, BCPS, BCCCP 03/20/2018, 9:47 AM

## 2018-03-21 ENCOUNTER — Encounter (INDEPENDENT_AMBULATORY_CARE_PROVIDER_SITE_OTHER): Payer: Self-pay | Admitting: Orthopedic Surgery

## 2018-03-21 LAB — BASIC METABOLIC PANEL
Anion gap: 7 (ref 5–15)
BUN: 13 mg/dL (ref 6–20)
CO2: 26 mmol/L (ref 22–32)
Calcium: 9 mg/dL (ref 8.9–10.3)
Chloride: 103 mmol/L (ref 98–111)
Creatinine, Ser: 0.72 mg/dL (ref 0.61–1.24)
GFR calc Af Amer: >60 mL/min (ref >60)
Glucose, Bld: 106 mg/dL — ABNORMAL HIGH (ref 70–99)
Potassium: 4.3 mmol/L (ref 3.5–5.1)
Sodium: 136 mmol/L (ref 135–145)

## 2018-03-21 LAB — PROTIME-INR
INR: 1.6 — AB (ref 0.8–1.2)
Prothrombin Time: 18.5 seconds — ABNORMAL HIGH (ref 11.4–15.2)

## 2018-03-21 MED ORDER — HYDROCODONE-ACETAMINOPHEN 5-325 MG PO TABS: 1.0000 | ORAL_TABLET | Freq: Four times a day (QID) | ORAL | 0 refills | Status: DC | PRN

## 2018-03-21 MED ORDER — DOXYCYCLINE HYCLATE 100 MG PO TABS: 100.0000 mg | ORAL_TABLET | Freq: Two times a day (BID) | ORAL | 0 refills | Status: AC

## 2018-03-21 MED ORDER — POLYETHYLENE GLYCOL 3350 17 G PO PACK: 17.0000 g | PACK | Freq: Every day | ORAL | 0 refills | Status: DC | PRN

## 2018-03-21 MED ORDER — ENOXAPARIN SODIUM 150 MG/ML ~~LOC~~ SOLN: 150.0000 mg | Freq: Two times a day (BID) | SUBCUTANEOUS | 0 refills | Status: DC

## 2018-03-21 MED ORDER — ENOXAPARIN SODIUM 150 MG/ML ~~LOC~~ SOLN
150.0000 mg | Freq: Two times a day (BID) | SUBCUTANEOUS | Status: DC
  Administered 2018-03-21: 150 mg via SUBCUTANEOUS
  Filled 2018-03-21: qty 1

## 2018-03-21 MED ORDER — DOXYCYCLINE HYCLATE 100 MG PO TABS
100.0000 mg | ORAL_TABLET | Freq: Two times a day (BID) | ORAL | Status: DC
  Administered 2018-03-21: 100 mg via ORAL
  Filled 2018-03-21: qty 1

## 2018-03-21 MED ORDER — WARFARIN SODIUM 5 MG PO TABS: 10.0000 mg | ORAL_TABLET | Freq: Once | ORAL | Status: DC

## 2018-03-21 MED ORDER — WARFARIN SODIUM 5 MG PO TABS: 5.0000 mg | ORAL_TABLET | ORAL | Status: DC

## 2018-03-21 MED ORDER — ACETAMINOPHEN 325 MG PO TABS: 325.0000 mg | ORAL_TABLET | Freq: Four times a day (QID) | ORAL | Status: DC | PRN

## 2018-03-21 NOTE — Progress Notes (Addendum)
ANTICOAGULATION CONSULT NOTE  Pharmacy Consult:  Coumadin Indication:  History of VTE + Factor V Leiden  No Known Allergies  Patient Measurements: Height = 71 inches Weight = 152.9 kg  Vital Signs: BP: 106/63 (02/25 0534) Pulse Rate: 69 (02/25 0534)  Labs: Recent Labs    03/19/18 0431 03/20/18 0245 03/21/18 0308 03/21/18 0623  HGB 12.8* 13.2  --   --   HCT 40.2 41.7  --   --   PLT 363 371  --   --   LABPROT 18.9* 18.0* 18.5*  --   INR 1.60 1.51 1.6*  --   CREATININE  --   --   --  0.72    Estimated Creatinine Clearance: 155 mL/min (by C-G formula based on SCr of 0.72 mg/dL).   Assessment: 89 YOM with history of VTE and Factor V Leiden presented with nonhealing wound of the right knee.  Coumadin transitioned to IV heparin for possible surgery; however, no surgery needed and Coumadin resumed 03/16/18.  INR uptrend to 1.6, subtherapeutic.    Home Coumadin dose:  7.5mg  alternating with 5mg  daily.  Goal of Therapy:  INR 2-3 Monitor platelets by anticoagulation protocol: Yes   Plan:  Warfarin 10mg  PO x 1 tonight Heparin 5000 units SQ Q8H until INR therapeutic Daily INR, s/s bleeding  Daylene Posey, PharmD Clinical Pharmacist Please check AMION for all [facility] Pharmacy numbers 03/21/2018 10:21 AM  ADDENDUM:  Stop heparin injections Start enoxaparin 150mg  [place] Q12h for Coumadin bridge until INR therapeutic at 2-3  Enzo Bi, PharmD, BCPS, [facility] Clinical Pharmacist Phone number [phone] 03/21/2018 12:13 PM

## 2018-03-21 NOTE — Care Management Note (Signed)
Case Management Note  Patient Details  Name: [name] MRN: [id number] Date of Birth: 1960/11/17  Subjective/Objective: 58 yr old male admitted with necrotic wound over right knee with cellulitis.                   Action/Plan: Case manager called referral to [facility]. Patient will need PT/INR on 03/23/18. CM spoke with [facility] who accepted patient. Wound Care per Dr./Joseph will begin after patient sees Dr. Lajoyce Corners on Monday 03/26/18. Patient says he has no transportation home, requested taxi.    Expected Discharge Date:  03/21/18               Expected Discharge Plan:  Home w Home Health Services  In-House Referral:  NA  Discharge planning Services  CM Consult  Post Acute Care Choice:  Home Health Choice offered to:  Patient  DME Arranged:  N/A DME Agency:  NA  HH Arranged:  RN HH Agency:  [facility]  Status of Service:  Completed, signed off  If discussed at Long Length of Stay Meetings, dates discussed:    Additional Comments:  Durenda Guthrie, RN 03/21/2018, 3:23 PM

## 2018-03-21 NOTE — Progress Notes (Signed)
Pt given oral and written discharged instructions. Peripheral IV removed and pt tolerated well. Assisted with belongings and [employer] taxi service called.

## 2018-03-21 NOTE — Progress Notes (Signed)
Office Visit Note   Patient: [name]           Date of Birth: [birth year]           MRN: [id number] Visit Date: 03/14/2018              Requested by: Simone Curia, MD [address] [place], [place] [address] PCP: Simone Curia, MD  Chief Complaint  Patient presents with  . Left Ankle - Routine Post Op      HPI: Patient is a 58 year old gentleman presenting with a large traumatic wound over the right knee and increasing pain in the left foot and ankle.  Patient states he slipped off his kneeling scooter and had a really bad fall and impaled his right knee on the kneeling scooter.  Patient states that he had to be helped by EMS to get off the floor.  Patient states he was taken to [facility] and was discharged to home.  Assessment & Plan: Visit Diagnoses:  1. S/P ankle fusion   2. Pain in left foot   3. Open wound knee/leg with tendon involvment, right, sequela     Plan: Patient has a large traumatic wound of the right knee that goes down to the tendon and the joint capsule.  We will plan for urgent intervention for debridement and possible wound closure.  Risks and benefits were discussed including potential for multiple surgeries potential for involvement of the total knee arthroplasty.  Follow-Up Instructions: No follow-ups on file.   Ortho Exam  Patient is alert, oriented, no adenopathy, well-dressed, normal affect, normal respiratory effort. Examination patient is currently ambulating on a kneeling scooter.  He has a large wound over the lateral aspect of the patella tendon with approximately 15 cm of surrounding cellulitis the wound is 4 cm by 1 cm.  Patient does have active extension there is no effusion of the knee no signs of intra-articular involvement.  Imaging: No results found.   Labs: Lab Results  Component Value Date   HGBA1C 5.5 03/19/2018   REPTSTATUS PENDING 03/17/2018   GRAMSTAIN  03/17/2018    ABUNDANT WBC PRESENT,BOTH PMN AND  MONONUCLEAR RARE GRAM POSITIVE COCCI    CULT  03/17/2018    HOLDING FOR POSSIBLE ANAEROBE Performed at [facility], [address] [place], [place] [address]      Lab Results  Component Value Date   ALBUMIN 3.0 (L) 03/14/2018   ALBUMIN 3.9 04/20/2017    Body mass index is 47 kg/m.  Orders:  Orders Placed This Encounter  Procedures  . XR Ankle Complete Left  . XR Foot Complete Left   No orders of the defined types were placed in this encounter.    Procedures: No procedures performed  Clinical Data: No additional findings.  ROS:  All other systems negative, except as noted in the HPI. Review of Systems  Objective: Vital Signs: Ht 5\' 11"  (1.803 m)   Wt (!) 337 lb (152.9 kg)   BMI 47.00 kg/m   Specialty Comments:  No specialty comments available.  PMFS History: Patient Active Problem List   Diagnosis Date Noted  . Abrasion, right knee, initial encounter   . Wound infection 03/15/2018  . Cellulitis of right anterior lower leg 03/15/2018  . History of pulmonary embolus (PE) 03/15/2018  . Cellulitis 03/14/2018  . S/P ankle fusion 04/20/2017  . Post-traumatic osteoarthritis, left ankle and foot   . 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  Impingement of left ankle joint 07/14/2016  . Ankle impingement syndrome, left   . History of total right knee replacement 03/09/2016  . Pain in left ankle and joints of left foot 03/09/2016   Past Medical History:  Diagnosis Date  . Anxiety   . Depression   . DVT (deep venous thrombosis) (HCC)    right clavicle and LLE  . Factor V Leiden (HCC)   . Fractures   . Frontal lobe deficit   . Hirschsprung's disease   . Hypertension   . Impingement syndrome of left ankle   . MRSA infection    left hip   . OA (osteoarthritis)    left ankle  . Sleep apnea    wears CPAP    Family History  Problem Relation Age of Onset  . Heart attack Father     Past Surgical History:  Procedure Laterality Date  .  ANKLE ARTHROSCOPY Left 07/14/2016   Procedure: LEFT ANKLE ARTHROSCOPY AND DEBRIDEMENT;  Surgeon: Nadara Mustard, MD;  Location: [facility] OR;  Service: Orthopedics;  Laterality: Left;  . ANKLE FUSION Left 04/20/2017   Procedure: LEFT TIBIOCALCANEAL FUSION;  Surgeon: Nadara Mustard, MD;  Location: [facility] OR;  Service: Orthopedics;  Laterality: Left;  . COLON SURGERY     Mega colon  . COLONOSCOPY    . HIP SURGERY Left    At [facility]  . I&D EXTREMITY Right 03/17/2018   Procedure: IRRIGATION AND DEBRIDEMENT RIGHT KNEE;  Surgeon: Nadara Mustard, MD;  Location: [facility] OR;  Service: Orthopedics;  Laterality: Right;  . KNEE ARTHROSCOPY    . LEFT ANKLE SCOPE WITH DEBRIDMENT  Left 07/14/2016   Social History   Occupational History  . Not on file  Tobacco Use  . Smoking status: Current Every Day Smoker    Packs/day: 0.50    Years: 20.00    Pack years: 10.00    Types: Cigarettes  . Smokeless tobacco: Never Used  Substance and Sexual Activity  . Alcohol use: Not Currently  . Drug use: Yes    Types: Marijuana    Comment: hasn't used any in 1 mth  . Sexual activity: Not on file

## 2018-03-21 NOTE — Plan of Care (Signed)
  Problem: Health Behavior/Discharge Planning: Goal: Ability to manage health-related needs will improve Outcome: Progressing   Problem: Clinical Measurements: Goal: Ability to maintain clinical measurements within normal limits will improve Outcome: Progressing   

## 2018-03-22 LAB — AEROBIC/ANAEROBIC CULTURE W GRAM STAIN (SURGICAL/DEEP WOUND): Culture: NO GROWTH

## 2018-03-22 NOTE — Discharge Summary (Signed)
Physician Discharge Summary  [name] SKA:[id number] DOB: 12-Jul-1960 DOA: 03/14/2018  PCP: Simone Curia, MD  Admit date: 03/14/2018 Discharge date: 03/22/2018  Time spent: 45 minutes  Recommendations for Outpatient Follow-up:  1. INR check per home health RN on 2/27, discontinue Lovenox when INR is greater than 2 2. Orthopedic follow-up with Dr. Aldean Baker on 3/2 3. 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 -Over the past week patient has noted increased redness swelling and discharge from his wound overlying his right knee went to see Dr. Lajoyce Corners who sent him to the emergency room for admission  Hospital Course:   Traumatic nectrotic wound over right knee with surrounding cellulitis -Admitted with a large necrotic wound over his right knee which developed following a fall, he was seen in [facility] and given antibiotics for this, subsequently worsened and then presented to see Dr. Lajoyce Corners who sent him to the hospital. -Was treated with broad-spectrum antibiotics, underwent a CT which noted  cellulitis, wound, small hematoma -Due to the necrotic wound he underwent surgical debridement and closure of the wound on 2/21  -Treated with vancomycin IV, wound cultures are negative -Transitioned to oral doxycycline -Discussed with orthopedics recommended follow-up on Monday and the office for dressing change  -Home health nursing also set up  Remote history of PE/DVT/factor V Leiden mutation -Coumadin was held initially for surgical debridement subsequently restarted Coumadin , INR is 1.6 today, -Discharge will be bridged with subcu Lovenox for probably 2 to 3 days until INR is therapeutic on Coumadin alone -Home health nursing set up to draw INR on 2/27  History of depression -Continue Prozac and Effexor  History of left ankle fusion 3/201 9 -Still with pain and limited ability to bear weight -Followed by Dr. Lajoyce Corners  Sleep apnea -CPAP nightly  Morbid obesity -BMI of 47 -Needs diet/lifestyle modification  Procedure -Surgical debridement of necrotic wound and wound closure on 2/21 by Dr. Lajoyce Corners  Discharge Exam: Vitals:   03/21/18 0534 03/21/18 1340  BP: 106/63 109/89  Pulse: 69 77  Resp: 16 14  Temp:  98.3 F (36.8 C)  SpO2: 92% 97%    General: AAOx3 Cardiovascular: S1S2/RRR Respiratory: CTAB  Discharge Instructions   Discharge Instructions    Diet - low sodium heart healthy   Complete by:  As directed    Increase activity slowly   Complete by:  As directed      Allergies as of 03/21/2018   No Known Allergies     Medication List    STOP taking these medications   clindamycin 300 MG capsule Commonly known as:  CLEOCIN   oxyCODONE-acetaminophen 10-325 MG  tablet Commonly known as:  PERCOCET     TAKE these medications   acetaminophen 325 MG tablet Commonly known as:  TYLENOL Take 1-2 tablets (325-650 mg total) by mouth every 6 (six) hours as needed for mild pain (pain score 1-3 or temp > 100.5).   atorvastatin 20 MG tablet Commonly known as:   LIPITOR Take 20 mg by mouth daily.   doxycycline 100 MG tablet Commonly known as:  VIBRA-TABS Take 1 tablet (100 mg total) by mouth 2 (two) times daily for 5 days.   enoxaparin 150 MG/ML injection Commonly known as:  LOVENOX Inject 1 mL (150 mg total) into the skin every 12 (twelve) hours for 2 days. Take this for 2days, Home health RN to draw INR on 2/27 and stop Lovenox injections of INR therpeutic   FLUoxetine 40 MG capsule Commonly known as:  PROZAC Take 80 mg by mouth daily.   gabapentin 400 MG capsule Commonly known as:  NEURONTIN Take 800-1,200 mg by mouth See admin instructions. Take 800 mg by mouth in the morning and take 1200 mg by mouth at bedtime   HYDROcodone-acetaminophen 5-325 MG tablet Commonly known as:  NORCO/VICODIN Take 1 tablet by mouth every 6 (six) hours as needed for moderate pain or severe pain.   polyethylene glycol packet Commonly known as:  MIRALAX / GLYCOLAX Take 17 g by mouth daily as needed for mild constipation.   traZODone 100 MG tablet Commonly known as:  DESYREL Take 200 mg by mouth at bedtime.   Venlafaxine HCl 225 MG Tb24 Take 225 mg by mouth daily.   warfarin 5 MG tablet Commonly known as:  COUMADIN Take 1-1.5 tablets (5-7.5 mg total) by mouth See admin instructions. TAke 10mg (2tabs) for next 2days then go back to usual regimen of Alternate days taking 1 tablet for 1 day then 1 and 1/2 tablets the next day What changed:  additional instructions      No Known Allergies Follow-up Information    Nadara Mustard, MD Follow up on 03/27/2018.   Specialty:  Orthopedic Surgery Why:  Please make sure you go to Dr.Duda's office on Monday for Wound recheck and dressing change Contact information: [address] [place] [address] [phone]        Simone Curia, MD. Schedule an appointment as soon as possible for a visit in 1 week(s).   Specialty:  Internal Medicine Contact information: [address] [place]  [place] [address] [phone]        [facility] Follow up.   Why:  A representative from [facility] will contact you to arrange start date and time for Home Health Nurse Contact information: [address] [place] [address] [phone]            The results of significant diagnostics from this hospitalization (including imaging, microbiology, ancillary and laboratory) are listed below for reference.    Significant Diagnostic Studies: Ct Knee Right W Wo Contrast  Result Date: 03/15/2018 CLINICAL DATA:  Knee trauma with erythema and swelling. EXAM: CT OF THE RIGHT KNEE WITHOUT AND WITH CONTRAST TECHNIQUE: Multidetector CT imaging was performed following the standard protocol before and after bolus administration of intravenous contrast. COMPARISON:  03/08/2018 CONTRAST:  100 cc Omnipaque 300 FINDINGS: Bones/Joint/Cartilage Redemonstration a total knee arthroplasty which creates beam hardening artifacts limiting assessment of the adjacent osseous elements about the knee. No acute displaced fracture or suspicious osseous lesions. Small lucencies in the anterior femoral metaphysis and adjacent  to the tibial tuberosity, series 8/10 and series 8/46 are felt secondary to a nutrient foramen or bony cleft. No evidence of hardware failure or loosening. Redemonstration of a moderate suprapatellar joint effusion. No intra-articular loose bodies. Ligaments Suboptimally assessed by CT. Muscles and Tendons No intramuscular hemorrhage or atrophy.  Intact extensor tendons. Soft tissues Pretibial subcutaneous small small hypodense fluid collection with a few foci of air may be secondary to posttraumatic hematoma and/or laceration. This is best seen on axial series 9/49 measuring approximately 2 x 1.7 cm. This is surrounded by soft tissue edema and skin thickening suspicious for cellulitis. The possibility of a small abscess is not entirely excluded. This is not optimally visualized on the  reformatted sagittal and coronal images however. IMPRESSION: Mild pretibial soft tissue induration tiny fluid collection that may represent a post traumatic hematoma. One or 2 dots of air are identified that may be associated with a soft tissue laceration accounting for this finding. Small abscess is not entirely excluded accounting for this appearance however. Surrounding cellulitis is noted the pretibial soft tissues. Total knee arthroplasty without complicating features. Moderate suprapatellar joint effusion. No apparent fracture about the knee. Should the patient have persistent symptoms, MRI of the knee may help identify occult fractures using metal artifact reduction sequences. Electronically Signed   By: Tollie Ethavid  Kwon M.D.   On: 03/15/2018 15:22   Dg Knee Complete 4 Views Right  Result Date: 03/14/2018 CLINICAL DATA:  Pain after fall. Abrasion. EXAM: RIGHT KNEE - COMPLETE 4+ VIEW COMPARISON:  None. FINDINGS: Soft tissue laceration associated with soft tissue swelling is identified anterior to the expected course of the patellar tendon. Total knee arthroplasty with intact hardware. No loosening or periprosthetic fracture. No bone destruction or joint effusion. IMPRESSION: Soft tissue laceration anterior to the expected course of the patellar tendon. Intact total knee arthroplasty. No acute osseous abnormality. Electronically Signed   By: Tollie Ethavid  Kwon M.D.   On: 03/14/2018 21:43   Xr Ankle Complete Left  Result Date: 03/21/2018 3 view radiographs of the left ankle shows stable internal fixation of the tibial calcaneal fusion without hardware complications.  Xr Foot Complete Left  Result Date: 03/21/2018 2 view radiographs of the left foot shows no evidence of acute fractures.   Microbiology: Recent Results (from the past 240 hour(s))  Blood Culture (routine x 2)     Status: None   Collection Time: 03/14/18  8:50 PM  Result Value Ref Range Status   Specimen Description BLOOD LEFT HAND  Final    Special Requests   Final    BOTTLES DRAWN AEROBIC AND ANAEROBIC Blood Culture results may not be optimal due to an inadequate volume of blood received in culture bottles Performed at [facility], [address] [place], [place] [address]    Culture NO GROWTH 5 DAYS  Final   Report Status 03/19/2018 FINAL  Final  Blood Culture (routine x 2)     Status: None   Collection Time: 03/14/18  8:57 PM  Result Value Ref Range Status   Specimen Description BLOOD LEFT WRIST  Final   Special Requests   Final    BOTTLES DRAWN AEROBIC AND ANAEROBIC Blood Culture results may not be optimal due to an inadequate volume of blood received in culture bottles Performed at [facility], [address] [place], [place] [address]    Culture NO GROWTH 5 DAYS  Final   Report Status 03/19/2018 FINAL  Final  Surgical pcr screen  Status: None   Collection Time: 03/16/18  6:42 PM  Result Value Ref Range Status   MRSA, PCR NEGATIVE NEGATIVE Final   Staphylococcus aureus NEGATIVE NEGATIVE Final    Comment: (NOTE) The Xpert SA Assay (FDA approved for NASAL specimens in patients 17 years of age and older), is one component of a comprehensive surveillance program. It is not intended to diagnose infection nor to guide or monitor treatment. Performed at [facility], [address] [place], [place] [address]   Aerobic/Anaerobic Culture (surgical/deep wound)     Status: None   Collection Time: 03/17/18  1:54 PM  Result Value Ref Range Status   Specimen Description WOUND RIGHT KNEE  Final   Special Requests NONE  Final   Gram Stain   Final    ABUNDANT WBC PRESENT,BOTH PMN AND MONONUCLEAR RARE GRAM POSITIVE COCCI    Culture   Final    No growth aerobically or anaerobically. Performed at [facility], [address] [place], [place] [address]    Report Status 03/22/2018 FINAL  Final     Labs: Basic Metabolic Panel: Recent Labs  Lab 03/16/18 0508 03/18/18 0251 03/21/18 0623   NA 141 138 136  K 4.6 4.1 4.3  CL 108 103 103  CO2 GLUCOSE 115* 110* 106*  BUN 5* 11 13  CREATININE 0.80 0.69 0.72  CALCIUM 8.7* 8.8* 9.0   Liver Function Tests: No results for input(s): AST, ALT, ALKPHOS, BILITOT, PROT, ALBUMIN in the last 168 hours. No results for input(s): LIPASE, AMYLASE in the last 168 hours. No results for input(s): AMMONIA in the last 168 hours. CBC: Recent Labs  Lab 03/16/18 0508 03/17/18 0419 03/18/18 0251 03/19/18 0431 03/20/18 0245  WBC 7.4 5.4 9.5 6.1 6.6  HGB 12.6* 12.7* 12.1* 12.8* 13.2  HCT 39.3 38.9* 39.0 40.2 41.7  MCV 94.0 94.2 93.8 94.4 93.7  PLT 310 336 349 363 371   Cardiac Enzymes: No results for input(s): CKTOTAL, CKMB, CKMBINDEX, TROPONINI in the last 168 hours. BNP: BNP (last 3 results) No results for input(s): BNP in the last 8760 hours.  ProBNP (last 3 results) No results for input(s): PROBNP in the last 8760 hours.  CBG: No results for input(s): GLUCAP in the last 168 hours.     Signed:  Zannie Cove MD.  [facility] 03/22/2018, 1:48 PM

## 2018-03-23 ENCOUNTER — Telehealth (INDEPENDENT_AMBULATORY_CARE_PROVIDER_SITE_OTHER): Payer: Self-pay

## 2018-03-23 ENCOUNTER — Other Ambulatory Visit: Payer: Self-pay

## 2018-03-23 DIAGNOSIS — L03115 Cellulitis of right lower limb: Secondary | ICD-10-CM | POA: Diagnosis not present

## 2018-03-23 DIAGNOSIS — I10 Essential (primary) hypertension: Secondary | ICD-10-CM | POA: Diagnosis not present

## 2018-03-23 DIAGNOSIS — M19071 Primary osteoarthritis, right ankle and foot: Secondary | ICD-10-CM | POA: Diagnosis not present

## 2018-03-23 DIAGNOSIS — Z4889 Encounter for other specified surgical aftercare: Secondary | ICD-10-CM | POA: Diagnosis not present

## 2018-03-23 DIAGNOSIS — S81001A Unspecified open wound, right knee, initial encounter: Secondary | ICD-10-CM | POA: Diagnosis not present

## 2018-03-23 DIAGNOSIS — Z5181 Encounter for therapeutic drug level monitoring: Secondary | ICD-10-CM | POA: Diagnosis not present

## 2018-03-23 DIAGNOSIS — F329 Major depressive disorder, single episode, unspecified: Secondary | ICD-10-CM | POA: Diagnosis not present

## 2018-03-23 DIAGNOSIS — Z7901 Long term (current) use of anticoagulants: Secondary | ICD-10-CM | POA: Diagnosis not present

## 2018-03-23 DIAGNOSIS — Z6832 Body mass index (BMI) 32.0-32.9, adult: Secondary | ICD-10-CM | POA: Diagnosis not present

## 2018-03-23 DIAGNOSIS — Z96642 Presence of left artificial hip joint: Secondary | ICD-10-CM | POA: Diagnosis not present

## 2018-03-23 DIAGNOSIS — Z96653 Presence of artificial knee joint, bilateral: Secondary | ICD-10-CM | POA: Diagnosis not present

## 2018-03-23 DIAGNOSIS — G4733 Obstructive sleep apnea (adult) (pediatric): Secondary | ICD-10-CM | POA: Diagnosis not present

## 2018-03-23 DIAGNOSIS — Z9181 History of falling: Secondary | ICD-10-CM | POA: Diagnosis not present

## 2018-03-23 DIAGNOSIS — E785 Hyperlipidemia, unspecified: Secondary | ICD-10-CM | POA: Diagnosis not present

## 2018-03-23 DIAGNOSIS — M19072 Primary osteoarthritis, left ankle and foot: Secondary | ICD-10-CM | POA: Diagnosis not present

## 2018-03-23 DIAGNOSIS — Z48298 Encounter for aftercare following other organ transplant: Secondary | ICD-10-CM | POA: Diagnosis not present

## 2018-03-23 DIAGNOSIS — Z86711 Personal history of pulmonary embolism: Secondary | ICD-10-CM | POA: Diagnosis not present

## 2018-03-23 DIAGNOSIS — D6851 Activated protein C resistance: Secondary | ICD-10-CM | POA: Diagnosis not present

## 2018-03-23 DIAGNOSIS — F17210 Nicotine dependence, cigarettes, uncomplicated: Secondary | ICD-10-CM | POA: Diagnosis not present

## 2018-03-23 DIAGNOSIS — Z86718 Personal history of other venous thrombosis and embolism: Secondary | ICD-10-CM | POA: Diagnosis not present

## 2018-03-23 NOTE — Telephone Encounter (Signed)
Called and sw [name] to advise that Dr. Lajoyce Corners does not manage the pt's INR. He had instructions on his d/c summary for internal medicine about stopping his lovenox and should follow up with his PCP for further instruction. Advised verbal ok to the home care orders below pt is s/p a right knee I&D. To call with any other questions.

## 2018-03-23 NOTE — Telephone Encounter (Signed)
Tiffany with [facility] called for orders and to report INR result. INR was 2.1, so they stopped the patient's Lovenox per Dr. Audrie Lia instructions.  She is requesting verbal order for frequency of visit: 1 week 1, 2 week 1, 1 week 3 and 1 month 1. Please advise. [phone]

## 2018-03-24 NOTE — Patient Outreach (Signed)
Screening call:  Placed call to patient on 03/23/2018.  Patient reports that he is doing well since discharge. Reports that he is taking his medications as prescribed. States that he has all his medications. Reports his main issue is transportation. Reports that he has spoken with [name] and that he understands his options.  Patient reports that he will make a follow up appointment with Dr. Nedra Hai after 03/24/2018.  Reports he is smart and knows how to self manages. Reports that he knows how to make things work.   PLAN:  Offered to open case and patient denies needs. Reports he would accept a letter with my contact information and will call me in the future for needs.   Will close case as patient denies any needs. Noted that MD office does transition of care.  Rowe Pavy, RN, BSN, CEN [facility] [employer] [phone]

## 2018-03-27 ENCOUNTER — Encounter (INDEPENDENT_AMBULATORY_CARE_PROVIDER_SITE_OTHER): Payer: Self-pay | Admitting: Orthopedic Surgery

## 2018-03-27 ENCOUNTER — Ambulatory Visit (INDEPENDENT_AMBULATORY_CARE_PROVIDER_SITE_OTHER): Payer: PPO | Admitting: Physician Assistant

## 2018-03-27 VITALS — Ht 71.0 in | Wt 337.0 lb

## 2018-03-27 DIAGNOSIS — S81001D Unspecified open wound, right knee, subsequent encounter: Secondary | ICD-10-CM

## 2018-03-27 MED ORDER — HYDROCODONE-ACETAMINOPHEN 5-325 MG PO TABS: 1.0000 | ORAL_TABLET | Freq: Four times a day (QID) | ORAL | 0 refills | Status: DC | PRN

## 2018-03-27 NOTE — Progress Notes (Signed)
Office Visit Note   Patient: [name]           Date of Birth: October 07, 1960           MRN: [id number] Visit Date: 03/27/2018              Requested by: Simone Curia, MD [address] [place], [place] [address] PCP: Simone Curia, MD  Chief Complaint  Patient presents with  . Right Knee - Routine Post Op      HPI: The patient is a 58 year old gentleman who is seen for postoperative follow-up following irrigation and debridement of the right knee wound on 03/17/2018.  He had fallen and was evaluated with a large necrotic wound over the right knee in the office and admitted for broad-spectrum intravenous antibiotic therapy.  Imaging with CT scans showed cellulitis and a small residual hematoma but no evidence for deeper infection or osteomyelitis.  The patient was treated perioperatively with intravenous vancomycin.  Operative cultures showed only gram-positive cocci on Gram stain but no no growth on culture.  The patient was trans-positioned to oral doxycycline and is completing a course.  Assessment & Plan: Visit Diagnoses:  1. Open knee wound, right, subsequent encounter     Plan: Sutures were left in place and the patient will follow-up next week for removal.  He can wash the area with Dial soap and water and apply dry gauze dressing and Ace wrap to secure.  He can ambulate weightbearing as tolerated.  He will follow-up in 1 week for suture removal.  Follow-Up Instructions: Return in about 1 week (around 04/03/2018).   Ortho Exam  Patient is alert, oriented, no adenopathy, well-dressed, normal affect, normal respiratory effort. The right knee incision is healing well and sutures were left in place.  There is no sign of infection or residual cellulitis.  His knee range of motion is full extension and at least 100 degrees of flexion.  There is mild fluid over the area.  But no erythema.  Imaging: No results found.   Labs: Lab Results  Component Value Date   HGBA1C 5.5  03/19/2018   REPTSTATUS 03/22/2018 FINAL 03/17/2018   GRAMSTAIN  03/17/2018    ABUNDANT WBC PRESENT,BOTH PMN AND MONONUCLEAR RARE GRAM POSITIVE COCCI    CULT  03/17/2018    No growth aerobically or anaerobically. Performed at [facility], [address], [place], [place] [address]      Lab Results  Component Value Date   ALBUMIN 3.0 (L) 03/14/2018   ALBUMIN 3.9 04/20/2017    Body mass index is 47 kg/m.  Orders:  No orders of the defined types were placed in this encounter.  Meds ordered this encounter  Medications  . HYDROcodone-acetaminophen (NORCO/VICODIN) 5-325 MG tablet    Sig: Take 1 tablet by mouth every 6 (six) hours as needed for moderate pain or severe pain.    Dispense:  28 tablet    Refill:  0     Procedures: No procedures performed  Clinical Data: No additional findings.  ROS:  All other systems negative, except as noted in the HPI. Review of Systems  Objective: Vital Signs: Ht 5\' 11"  (1.803 m)   Wt (!) 337 lb (152.9 kg)   BMI 47.00 kg/m   Specialty Comments:  No specialty comments available.  PMFS History: Patient Active Problem List   Diagnosis Date Noted  . Abrasion, right knee, initial encounter   . Wound infection 03/15/2018  . Cellulitis  of right anterior lower leg 03/15/2018  . History of pulmonary embolus (PE) 03/15/2018  . Cellulitis 03/14/2018  . S/P ankle fusion 04/20/2017  . Post-traumatic osteoarthritis, left ankle and foot   . 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 Impingement of left ankle joint 07/14/2016  . Ankle impingement syndrome, left   . History of total right knee replacement 03/09/2016  . Pain in left ankle and joints of left foot 03/09/2016   Past Medical History:  Diagnosis Date  . Anxiety   . Depression   . DVT (deep venous thrombosis) (HCC)    right clavicle and LLE  . Factor V Leiden (HCC)   . Fractures   . Frontal lobe deficit   . Hirschsprung's disease   . Hypertension   .  Impingement syndrome of left ankle   . MRSA infection    left hip   . OA (osteoarthritis)    left ankle  . Sleep apnea    wears CPAP    Family History  Problem Relation Age of Onset  . Heart attack Father     Past Surgical History:  Procedure Laterality Date  . ANKLE ARTHROSCOPY Left 07/14/2016   Procedure: LEFT ANKLE ARTHROSCOPY AND DEBRIDEMENT;  Surgeon: Nadara Mustard, MD;  Location[facility];  Service: Orthopedics;  Laterality: Left;  . ANKLE FUSION Left 04/20/2017   Procedure: LEFT TIBIOCALCANEAL FUSION;  Surgeon: Nadara Mustard, MD;  Location: [facility] OR;  Service: Orthopedics;  Laterality: Left;  . COLON SURGERY     Mega colon  . COLONOSCOPY    . HIP SURGERY Left    At [facility]  . I&D EXTREMITY Right 03/17/2018   Procedure: IRRIGATION AND DEBRIDEMENT RIGHT KNEE;  Surgeon: Nadara Mustard, MD;  Location: [facility] OR;  Service: Orthopedics;  Laterality: Right;  . KNEE ARTHROSCOPY    . LEFT ANKLE SCOPE WITH DEBRIDMENT  Left 07/14/2016   Social History   Occupational History  . Not on file  Tobacco Use  . Smoking status: Current Every Day Smoker    Packs/day: 0.50    Years: 20.00    Pack years: 10.00    Types: Cigarettes  . Smokeless tobacco: Never Used  Substance and Sexual Activity  . Alcohol use: Not Currently  . Drug use: Yes    Types: Marijuana    Comment: hasn't used any in 1 mth  . Sexual activity: Not on file

## 2018-03-31 DIAGNOSIS — Z79899 Other long term (current) drug therapy: Secondary | ICD-10-CM | POA: Diagnosis not present

## 2018-03-31 DIAGNOSIS — I2699 Other pulmonary embolism without acute cor pulmonale: Secondary | ICD-10-CM | POA: Diagnosis not present

## 2018-03-31 DIAGNOSIS — K219 Gastro-esophageal reflux disease without esophagitis: Secondary | ICD-10-CM | POA: Diagnosis not present

## 2018-03-31 DIAGNOSIS — F419 Anxiety disorder, unspecified: Secondary | ICD-10-CM | POA: Diagnosis not present

## 2018-03-31 DIAGNOSIS — R609 Edema, unspecified: Secondary | ICD-10-CM | POA: Diagnosis not present

## 2018-03-31 DIAGNOSIS — R791 Abnormal coagulation profile: Secondary | ICD-10-CM | POA: Diagnosis not present

## 2018-03-31 DIAGNOSIS — E291 Testicular hypofunction: Secondary | ICD-10-CM | POA: Diagnosis not present

## 2018-03-31 DIAGNOSIS — K589 Irritable bowel syndrome without diarrhea: Secondary | ICD-10-CM | POA: Diagnosis not present

## 2018-03-31 DIAGNOSIS — D6851 Activated protein C resistance: Secondary | ICD-10-CM | POA: Diagnosis not present

## 2018-03-31 DIAGNOSIS — L0291 Cutaneous abscess, unspecified: Secondary | ICD-10-CM | POA: Diagnosis not present

## 2018-03-31 DIAGNOSIS — M159 Polyosteoarthritis, unspecified: Secondary | ICD-10-CM | POA: Diagnosis not present

## 2018-04-03 ENCOUNTER — Ambulatory Visit (INDEPENDENT_AMBULATORY_CARE_PROVIDER_SITE_OTHER): Payer: PPO | Admitting: Orthopedic Surgery

## 2018-04-03 ENCOUNTER — Encounter (INDEPENDENT_AMBULATORY_CARE_PROVIDER_SITE_OTHER): Payer: Self-pay | Admitting: Orthopedic Surgery

## 2018-04-03 VITALS — Ht 71.0 in | Wt 337.0 lb

## 2018-04-03 DIAGNOSIS — S81001D Unspecified open wound, right knee, subsequent encounter: Secondary | ICD-10-CM

## 2018-04-03 DIAGNOSIS — Z981 Arthrodesis status: Secondary | ICD-10-CM

## 2018-04-03 NOTE — Progress Notes (Signed)
Office Visit Note   Patient: [name]           Date of Birth: 1960/03/13           MRN: [id number] Visit Date: 04/03/2018              Requested by: Simone Curia, MD [address] [place], [place] [address] PCP: Simone Curia, MD  Chief Complaint  Patient presents with  . Right Knee - Routine Post Op    2/21/2020irrigation and debridement  Right knee wound       HPI: Patient is seen in follow-up for 2 separate issues #1 tibial calcaneal fusion on the left patient states he still has midfoot pain.  He is also status post irrigation debridement of a traumatic wound right knee.  Assessment & Plan: Visit Diagnoses:  1. Open knee wound, right, subsequent encounter   2. S/P ankle fusion     Plan: Recommend that he wear his hiking boots with a heel lift for the left lower extremity.  The knee has healed nicely he can use scar massage and ointment.  Follow-Up Instructions: Return in about 3 months (around 07/04/2018).   Ortho Exam  Patient is alert, oriented, no adenopathy, well-dressed, normal affect, normal respiratory effort. Examination patient walks with a cane he has external rotation of the left foot.  He has equinus contracture through the midfoot of about 10 degrees.  The hindfoot fusion is stable there is no pain with attempted distraction of the hindfoot fusion.  There is no redness no cellulitis no signs of infection.  The right knee traumatic wound has healed nicely with no complicating features.  Imaging: No results found. No images are attached to the encounter.  Labs: Lab Results  Component Value Date   HGBA1C 5.5 03/19/2018   REPTSTATUS 03/22/2018 FINAL 03/17/2018   GRAMSTAIN  03/17/2018    ABUNDANT WBC PRESENT,BOTH PMN AND MONONUCLEAR RARE GRAM POSITIVE COCCI    CULT  03/17/2018    No growth aerobically or anaerobically. Performed at [facility], [address], [place], [place] [address]      Lab Results  Component Value Date   ALBUMIN 3.0 (L) 03/14/2018   ALBUMIN 3.9 04/20/2017    Body mass index is 47 kg/m.  Orders:  No orders of the defined types were placed in this encounter.  No orders of the defined types were placed in this encounter.    Procedures: No procedures performed  Clinical Data: No additional findings.  ROS:  All other systems negative, except as noted in the HPI. Review of Systems  Objective: Vital Signs: Ht 5\' 11"  (1.803 m)   Wt (!) 337 lb (152.9 kg)   BMI 47.00 kg/m   Specialty Comments:  No specialty comments available.  PMFS History: Patient Active Problem List   Diagnosis Date Noted  . Abrasion, right knee, initial encounter   . Wound infection 03/15/2018  . Cellulitis of right anterior lower leg 03/15/2018  . History of pulmonary embolus (PE) 03/15/2018  . Cellulitis 03/14/2018  . S/P ankle fusion 04/20/2017  . Post-traumatic osteoarthritis, left ankle and foot   . 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 Impingement of left ankle joint 07/14/2016  . Ankle impingement syndrome, left   . History of total right knee replacement 03/09/2016  . Pain in left ankle and joints of left foot 03/09/2016   Past Medical History:  Diagnosis Date  . Anxiety   . Depression   .  DVT (deep venous thrombosis) (HCC)    right clavicle and LLE  . Factor V Leiden (HCC)   . Fractures   . Frontal lobe deficit   . Hirschsprung's disease   . Hypertension   . Impingement syndrome of left ankle   . MRSA infection    left hip   . OA (osteoarthritis)    left ankle  . Sleep apnea    wears CPAP    Family History  Problem Relation Age of Onset  . Heart attack Father     Past Surgical History:  Procedure Laterality Date  . ANKLE ARTHROSCOPY Left 07/14/2016   Procedure: LEFT ANKLE ARTHROSCOPY AND DEBRIDEMENT;  Surgeon: Nadara Mustard, MD;  Location: [facility] OR;  Service: Orthopedics;  Laterality: Left;  . ANKLE FUSION Left 04/20/2017   Procedure: LEFT TIBIOCALCANEAL FUSION;   Surgeon: Nadara Mustard, MD;  Location: [facility] OR;  Service: Orthopedics;  Laterality: Left;  . COLON SURGERY     Mega colon  . COLONOSCOPY    . HIP SURGERY Left    At [facility]  . I&D EXTREMITY Right 03/17/2018   Procedure: IRRIGATION AND DEBRIDEMENT RIGHT KNEE;  Surgeon: Nadara Mustard, MD;  Location: [facility] OR;  Service: Orthopedics;  Laterality: Right;  . KNEE ARTHROSCOPY    . LEFT ANKLE SCOPE WITH DEBRIDMENT  Left 07/14/2016   Social History   Occupational History  . Not on file  Tobacco Use  . Smoking status: Current Every Day Smoker    Packs/day: 0.50    Years: 20.00    Pack years: 10.00    Types: Cigarettes  . Smokeless tobacco: Never Used  Substance and Sexual Activity  . Alcohol use: Not Currently  . Drug use: Yes    Types: Marijuana    Comment: hasn't used any in 1 mth  . Sexual activity: Not on file

## 2018-04-14 DIAGNOSIS — R609 Edema, unspecified: Secondary | ICD-10-CM | POA: Diagnosis not present

## 2018-04-14 DIAGNOSIS — E7800 Pure hypercholesterolemia, unspecified: Secondary | ICD-10-CM | POA: Diagnosis not present

## 2018-04-14 DIAGNOSIS — N5089 Other specified disorders of the male genital organs: Secondary | ICD-10-CM | POA: Diagnosis not present

## 2018-04-14 DIAGNOSIS — I2699 Other pulmonary embolism without acute cor pulmonale: Secondary | ICD-10-CM | POA: Diagnosis not present

## 2018-04-14 DIAGNOSIS — F419 Anxiety disorder, unspecified: Secondary | ICD-10-CM | POA: Diagnosis not present

## 2018-04-14 DIAGNOSIS — D6851 Activated protein C resistance: Secondary | ICD-10-CM | POA: Diagnosis not present

## 2018-04-14 DIAGNOSIS — E291 Testicular hypofunction: Secondary | ICD-10-CM | POA: Diagnosis not present

## 2018-04-14 DIAGNOSIS — E8881 Metabolic syndrome: Secondary | ICD-10-CM | POA: Diagnosis not present

## 2018-04-14 DIAGNOSIS — F17201 Nicotine dependence, unspecified, in remission: Secondary | ICD-10-CM | POA: Diagnosis not present

## 2018-04-14 DIAGNOSIS — K219 Gastro-esophageal reflux disease without esophagitis: Secondary | ICD-10-CM | POA: Diagnosis not present

## 2018-04-14 DIAGNOSIS — R791 Abnormal coagulation profile: Secondary | ICD-10-CM | POA: Diagnosis not present

## 2018-04-14 DIAGNOSIS — M159 Polyosteoarthritis, unspecified: Secondary | ICD-10-CM | POA: Diagnosis not present

## 2018-04-14 DIAGNOSIS — K589 Irritable bowel syndrome without diarrhea: Secondary | ICD-10-CM | POA: Diagnosis not present

## 2018-04-14 DIAGNOSIS — I10 Essential (primary) hypertension: Secondary | ICD-10-CM | POA: Diagnosis not present

## 2018-05-01 DIAGNOSIS — K219 Gastro-esophageal reflux disease without esophagitis: Secondary | ICD-10-CM | POA: Diagnosis not present

## 2018-05-01 DIAGNOSIS — D6851 Activated protein C resistance: Secondary | ICD-10-CM | POA: Diagnosis not present

## 2018-05-01 DIAGNOSIS — K589 Irritable bowel syndrome without diarrhea: Secondary | ICD-10-CM | POA: Diagnosis not present

## 2018-05-01 DIAGNOSIS — E291 Testicular hypofunction: Secondary | ICD-10-CM | POA: Diagnosis not present

## 2018-05-01 DIAGNOSIS — G4733 Obstructive sleep apnea (adult) (pediatric): Secondary | ICD-10-CM | POA: Diagnosis not present

## 2018-05-01 DIAGNOSIS — M159 Polyosteoarthritis, unspecified: Secondary | ICD-10-CM | POA: Diagnosis not present

## 2018-05-01 DIAGNOSIS — I2699 Other pulmonary embolism without acute cor pulmonale: Secondary | ICD-10-CM | POA: Diagnosis not present

## 2018-05-01 DIAGNOSIS — E8881 Metabolic syndrome: Secondary | ICD-10-CM | POA: Diagnosis not present

## 2018-05-01 DIAGNOSIS — F419 Anxiety disorder, unspecified: Secondary | ICD-10-CM | POA: Diagnosis not present

## 2018-05-01 DIAGNOSIS — R609 Edema, unspecified: Secondary | ICD-10-CM | POA: Diagnosis not present

## 2018-05-01 DIAGNOSIS — F17201 Nicotine dependence, unspecified, in remission: Secondary | ICD-10-CM | POA: Diagnosis not present

## 2018-05-01 DIAGNOSIS — N5089 Other specified disorders of the male genital organs: Secondary | ICD-10-CM | POA: Diagnosis not present

## 2018-05-30 ENCOUNTER — Ambulatory Visit: Payer: Self-pay | Admitting: Orthopaedic Surgery

## 2018-05-30 DIAGNOSIS — D6851 Activated protein C resistance: Secondary | ICD-10-CM | POA: Diagnosis not present

## 2018-05-30 DIAGNOSIS — E291 Testicular hypofunction: Secondary | ICD-10-CM | POA: Diagnosis not present

## 2018-05-30 DIAGNOSIS — K219 Gastro-esophageal reflux disease without esophagitis: Secondary | ICD-10-CM | POA: Diagnosis not present

## 2018-05-30 DIAGNOSIS — M159 Polyosteoarthritis, unspecified: Secondary | ICD-10-CM | POA: Diagnosis not present

## 2018-05-30 DIAGNOSIS — R609 Edema, unspecified: Secondary | ICD-10-CM | POA: Diagnosis not present

## 2018-05-30 DIAGNOSIS — F17201 Nicotine dependence, unspecified, in remission: Secondary | ICD-10-CM | POA: Diagnosis not present

## 2018-05-30 DIAGNOSIS — E8881 Metabolic syndrome: Secondary | ICD-10-CM | POA: Diagnosis not present

## 2018-05-30 DIAGNOSIS — K589 Irritable bowel syndrome without diarrhea: Secondary | ICD-10-CM | POA: Diagnosis not present

## 2018-05-30 DIAGNOSIS — N5089 Other specified disorders of the male genital organs: Secondary | ICD-10-CM | POA: Diagnosis not present

## 2018-05-30 DIAGNOSIS — F419 Anxiety disorder, unspecified: Secondary | ICD-10-CM | POA: Diagnosis not present

## 2018-05-30 DIAGNOSIS — I2699 Other pulmonary embolism without acute cor pulmonale: Secondary | ICD-10-CM | POA: Diagnosis not present

## 2018-05-30 DIAGNOSIS — R791 Abnormal coagulation profile: Secondary | ICD-10-CM | POA: Diagnosis not present

## 2018-06-06 DIAGNOSIS — I2699 Other pulmonary embolism without acute cor pulmonale: Secondary | ICD-10-CM | POA: Diagnosis not present

## 2018-06-06 DIAGNOSIS — F419 Anxiety disorder, unspecified: Secondary | ICD-10-CM | POA: Diagnosis not present

## 2018-06-06 DIAGNOSIS — K589 Irritable bowel syndrome without diarrhea: Secondary | ICD-10-CM | POA: Diagnosis not present

## 2018-06-06 DIAGNOSIS — M159 Polyosteoarthritis, unspecified: Secondary | ICD-10-CM | POA: Diagnosis not present

## 2018-06-06 DIAGNOSIS — D6851 Activated protein C resistance: Secondary | ICD-10-CM | POA: Diagnosis not present

## 2018-06-06 DIAGNOSIS — R609 Edema, unspecified: Secondary | ICD-10-CM | POA: Diagnosis not present

## 2018-06-06 DIAGNOSIS — R791 Abnormal coagulation profile: Secondary | ICD-10-CM | POA: Diagnosis not present

## 2018-06-06 DIAGNOSIS — N5089 Other specified disorders of the male genital organs: Secondary | ICD-10-CM | POA: Diagnosis not present

## 2018-06-06 DIAGNOSIS — E8881 Metabolic syndrome: Secondary | ICD-10-CM | POA: Diagnosis not present

## 2018-06-06 DIAGNOSIS — K219 Gastro-esophageal reflux disease without esophagitis: Secondary | ICD-10-CM | POA: Diagnosis not present

## 2018-06-06 DIAGNOSIS — E291 Testicular hypofunction: Secondary | ICD-10-CM | POA: Diagnosis not present

## 2018-06-08 ENCOUNTER — Encounter: Payer: Self-pay | Admitting: Orthopedic Surgery

## 2018-06-28 ENCOUNTER — Encounter: Payer: Self-pay | Admitting: Orthopedic Surgery

## 2018-07-03 ENCOUNTER — Other Ambulatory Visit: Payer: Self-pay

## 2018-07-03 ENCOUNTER — Ambulatory Visit (INDEPENDENT_AMBULATORY_CARE_PROVIDER_SITE_OTHER): Payer: PPO | Admitting: Orthopedic Surgery

## 2018-07-03 ENCOUNTER — Ambulatory Visit (INDEPENDENT_AMBULATORY_CARE_PROVIDER_SITE_OTHER): Payer: PPO

## 2018-07-03 ENCOUNTER — Encounter: Payer: Self-pay | Admitting: Orthopedic Surgery

## 2018-07-03 VITALS — Ht 71.0 in | Wt 337.0 lb

## 2018-07-03 DIAGNOSIS — M25512 Pain in left shoulder: Secondary | ICD-10-CM | POA: Diagnosis not present

## 2018-07-03 DIAGNOSIS — S81001D Unspecified open wound, right knee, subsequent encounter: Secondary | ICD-10-CM

## 2018-07-03 DIAGNOSIS — Z981 Arthrodesis status: Secondary | ICD-10-CM | POA: Diagnosis not present

## 2018-07-03 DIAGNOSIS — G8929 Other chronic pain: Secondary | ICD-10-CM | POA: Diagnosis not present

## 2018-07-03 MED ORDER — LIDOCAINE HCL 1 % IJ SOLN
5.0000 mL | INTRAMUSCULAR | Status: AC | PRN
  Administered 2018-07-03: 5 mL

## 2018-07-03 MED ORDER — METHYLPREDNISOLONE ACETATE 40 MG/ML IJ SUSP
40.0000 mg | INTRAMUSCULAR | Status: AC | PRN
  Administered 2018-07-03: 40 mg via INTRA_ARTICULAR

## 2018-07-03 MED ORDER — DICLOFENAC SODIUM 1 % TD GEL: 2.0000 g | Freq: Four times a day (QID) | TRANSDERMAL | 3 refills | Status: DC | PRN

## 2018-07-03 NOTE — Progress Notes (Signed)
Office Visit Note   Patient: [name]           Date of Birth: August 24, 1960           MRN: [id number] Visit Date: 07/03/2018              Requested by: Cher Nakai, MD [address] [place], [place] [address] PCP: Cher Nakai, MD  Chief Complaint  Patient presents with  . Left Ankle - Pain    03/2017 ankle fusion   . Left Shoulder - Pain      HPI: Patient is a 58 year old gentleman who presents for 2 separate issues.  #1 he is 10 weeks  status post tibial calcaneal fusion.  Patient is in regular shoewear but states he still has pain with ambulation.  #2 patient complains of chronic left shoulder pain he states he still has full range of motion but does have some mechanical symptoms when getting his arm to an overhead position.  He states injections have worked in the past.  [place]: Visit Diagnoses:  1. S/P ankle fusion   2. Chronic left shoulder pain   3. Open knee wound, right, subsequent encounter     Plan: The left shoulder was injected in the subacromial space.  Patient was given a prescription for Voltaren gel to help with his midfoot pain on the left.  Discussed that if he starts losing range of motion of the left shoulder or is more symptomatic we may need to get an MRI scan to consider arthroscopy of the left shoulder.  Follow-Up Instructions: Return in about 4 weeks (around 07/31/2018).   Ortho Exam  Patient is alert, oriented, no adenopathy, well-dressed, normal affect, normal respiratory effort. Examination patient has full range of motion of the left shoulder.  He does have pain with Neer and Hawkins impingement test the biceps tendon is tender to palpation.  Examination of the left foot he is tender through the midfoot with ambulation he walks with his foot externally rotated.  Imaging: Xr Ankle Complete Left  Result Date: 07/03/2018 3 view radiographs of the left ankle shows stable tibial calcaneal fusion with no hardware failure no lucency.  Xr Shoulder Left  Result Date: 07/03/2018 2 view radiographs of the left shoulder shows a congruent glenohumeral joint.  The [facility] joint is congruent.  There is decreased joint space in the subacromial space.  No type III acromion.  No images are attached to the encounter.  Labs: Lab Results  Component Value Date   HGBA1C 5.5 03/19/2018   REPTSTATUS 03/22/2018 FINAL 03/17/2018   GRAMSTAIN  03/17/2018    ABUNDANT WBC PRESENT,BOTH PMN AND MONONUCLEAR RARE GRAM POSITIVE COCCI    CULT  03/17/2018    No growth aerobically or anaerobically. Performed at [facility], [place] [address] [place], [place] [address]      Lab Results  Component Value Date   ALBUMIN 3.0 (L) 03/14/2018   ALBUMIN 3.9 04/20/2017    Body mass index is 47 kg/m.  Orders:  Orders Placed This Encounter  Procedures  . XR Ankle Complete Left  . XR Shoulder Left   Meds ordered this encounter  Medications  . diclofenac sodium (VOLTAREN) 1 % GEL    Sig: Apply 2 g topically 4 (four) times daily as needed.    Dispense:  100 g    Refill:  3     Procedures: Large Joint Inj: L subacromial bursa on 07/03/2018 12:17 PM Indications:  diagnostic evaluation and pain Details: 22 G 1.5 in needle, posterior approach  Arthrogram: No  Medications: 5 mL lidocaine 1 %; 40 mg methylPREDNISolone acetate 40 MG/ML Outcome: tolerated well, no immediate complications Procedure, treatment alternatives, risks and benefits explained, specific risks discussed. Consent was given by the patient. Immediately prior to procedure a time out was called to verify the correct patient, procedure, equipment, support staff and site/side marked as required. Patient was prepped and draped in the usual sterile fashion.      Clinical Data: No additional findings.  ROS:  All other systems negative, except as noted in the HPI. Review of Systems  Objective: Vital Signs: Ht 5\' 11"  (1.803 m)   Wt (!) 337 lb (152.9 kg)   BMI 47.00 kg/m    Specialty Comments:  No specialty comments available.  PMFS History: Patient Active Problem List   Diagnosis Date Noted  . Abrasion, right knee, initial encounter   . Wound infection 03/15/2018  . Cellulitis of right anterior lower leg 03/15/2018  . History of pulmonary embolus (PE) 03/15/2018  . Cellulitis 03/14/2018  . S/P ankle fusion 04/20/2017  . Post-traumatic osteoarthritis, left ankle and foot   . 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 Impingement of left ankle joint 07/14/2016  . Ankle impingement syndrome, left   . History of total right knee replacement 03/09/2016  . Pain in left ankle and joints of left foot 03/09/2016   Past Medical History:  Diagnosis Date  . Anxiety   . Depression   . DVT (deep venous thrombosis) (HCC)    right clavicle and LLE  . Factor V Leiden (HCC)   . Fractures   . Frontal lobe deficit   . Hirschsprung's disease   . Hypertension   . Impingement syndrome of left ankle   . MRSA infection    left hip   . OA (osteoarthritis)    left ankle  . Sleep apnea    wears CPAP    Family History  Problem Relation Age of Onset  . Heart attack Father     Past Surgical History:  Procedure Laterality Date  . ANKLE ARTHROSCOPY Left 07/14/2016   Procedure: LEFT ANKLE ARTHROSCOPY AND DEBRIDEMENT;  Surgeon: Nadara Mustarduda,  V, MD;  [facility];  Service: Orthopedics;  Laterality: Left;  . ANKLE FUSION Left 04/20/2017   Procedure: LEFT TIBIOCALCANEAL FUSION;  Surgeon: Nadara Mustarduda,  V, MD;  [facility];  Service: Orthopedics;  Laterality: Left;  . COLON SURGERY     Mega colon  . COLONOSCOPY    . [facility]GERY [facility]    [facility]  . I&D EXTREMITY Right 03/17/2018   Procedure: IRRIGATION AND DEBRIDEMENT RIGHT KNEE;  Surgeon: Nadara Mustarduda,  V, MD;  [facility];  Service: Orthopedics;  Laterality: Right;  . KNEE ARTHROSCOPY    . LEFT ANKLE SCOPE WITH DEBRIDMENT  Left 07/14/2016   Social History   Occupational History  . Not on file  Tobacco  Use  . Smoking status: Current Every Day Smoker    Packs/day: 0.50    Years: 20.00    Pack years: 10.00    Types: Cigarettes  . Smokeless tobacco: Never Used  Substance and Sexual Activity  . Alcohol use: Not Currently  . Drug use: Yes    Types: Marijuana    Comment: hasn't used any in 1 mth  . Sexual activity: Not on file

## 2018-07-05 DIAGNOSIS — I2699 Other pulmonary embolism without acute cor pulmonale: Secondary | ICD-10-CM | POA: Diagnosis not present

## 2018-07-05 DIAGNOSIS — M159 Polyosteoarthritis, unspecified: Secondary | ICD-10-CM | POA: Diagnosis not present

## 2018-07-05 DIAGNOSIS — E7800 Pure hypercholesterolemia, unspecified: Secondary | ICD-10-CM | POA: Diagnosis not present

## 2018-07-05 DIAGNOSIS — K219 Gastro-esophageal reflux disease without esophagitis: Secondary | ICD-10-CM | POA: Diagnosis not present

## 2018-07-05 DIAGNOSIS — K589 Irritable bowel syndrome without diarrhea: Secondary | ICD-10-CM | POA: Diagnosis not present

## 2018-07-05 DIAGNOSIS — N5089 Other specified disorders of the male genital organs: Secondary | ICD-10-CM | POA: Diagnosis not present

## 2018-07-05 DIAGNOSIS — E8881 Metabolic syndrome: Secondary | ICD-10-CM | POA: Diagnosis not present

## 2018-07-05 DIAGNOSIS — I10 Essential (primary) hypertension: Secondary | ICD-10-CM | POA: Diagnosis not present

## 2018-07-05 DIAGNOSIS — D6851 Activated protein C resistance: Secondary | ICD-10-CM | POA: Diagnosis not present

## 2018-07-05 DIAGNOSIS — F17201 Nicotine dependence, unspecified, in remission: Secondary | ICD-10-CM | POA: Diagnosis not present

## 2018-07-05 DIAGNOSIS — F419 Anxiety disorder, unspecified: Secondary | ICD-10-CM | POA: Diagnosis not present

## 2018-07-05 DIAGNOSIS — Z7901 Long term (current) use of anticoagulants: Secondary | ICD-10-CM | POA: Diagnosis not present

## 2018-07-05 DIAGNOSIS — E291 Testicular hypofunction: Secondary | ICD-10-CM | POA: Diagnosis not present

## 2018-07-05 DIAGNOSIS — R609 Edema, unspecified: Secondary | ICD-10-CM | POA: Diagnosis not present

## 2018-07-31 ENCOUNTER — Other Ambulatory Visit: Payer: Self-pay

## 2018-07-31 ENCOUNTER — Encounter: Payer: Self-pay | Admitting: Orthopedic Surgery

## 2018-07-31 ENCOUNTER — Ambulatory Visit (INDEPENDENT_AMBULATORY_CARE_PROVIDER_SITE_OTHER): Payer: PPO | Admitting: Physician Assistant

## 2018-07-31 VITALS — Ht 71.0 in | Wt 337.0 lb

## 2018-07-31 DIAGNOSIS — Z981 Arthrodesis status: Secondary | ICD-10-CM

## 2018-07-31 DIAGNOSIS — G8929 Other chronic pain: Secondary | ICD-10-CM

## 2018-07-31 DIAGNOSIS — M25512 Pain in left shoulder: Secondary | ICD-10-CM

## 2018-07-31 NOTE — Progress Notes (Signed)
Office Visit Note   Patient: [name]           Date of Birth: 11/01/1960           MRN: [id number] Visit Date: 07/31/2018              Requested by: Simone CuriaLee, Keung, MD [address] [place],  [place] [id number] PCP: Simone CuriaLee, Keung, MD  Chief Complaint  Patient presents with  . Left Ankle - Follow-up    03/2017 ankle fusion   . Left Shoulder - Follow-up    S/p injection 07/03/18      HPI: The patient is a 58 year old gentleman who presents for follow-up of his left tibiocalcaneal fusion and left shoulder pain. With regards to his ankle fusion he continues to report some pain about the ankle joint itself.  He is on his usual chronic gabapentin and other medications for chronic depression issues but reports that nothing is helping much with the chronic pain.  He is ambulating without an assistive device in a regular shoe.  He reports the left ankle is more painful and stiff following a resting.  Such as sitting for period of time or overnight. He continues to have difficulty with the left shoulder and reports the steroid injection only helped for a couple of days.  He reports pain with movement and pain trying to sleep up on the shoulder.  He reports difficulty reaching for things overhead and donning and doffing clothing.  Assessment & Plan: Visit Diagnoses:  1. S/P ankle fusion   2. Chronic left shoulder pain     Plan: We will obtain MRI scan of the left shoulder to further evaluate his chronic shoulder pain with some impingement syndrome symptoms. If his left ankle remains painful will check follow-up radiographs at next visit.  He will follow-up once his MRI scan of his shoulder is completed.  Follow-Up Instructions: Return in about 4 weeks (around 08/28/2018).   Ortho Exam  Patient is alert, oriented, no adenopathy, well-dressed, normal affect, normal respiratory effort. Left ankle is well-healed without signs of infection or cellulitis.  There is some mild localized edema  about the ankle.  He ambulates with the foot externally rotated with an antalgic appearing gait. The left shoulder shows painful forward flexion internal and external rotation but his active motion is actually fairly good.  He is tender to palpation over the distal deltoid.  He is neurovascularly intact distally.  Imaging: No results found. No images are attached to the encounter.  Labs: Lab Results  Component Value Date   HGBA1C 5.5 03/19/2018   REPTSTATUS 03/22/2018 FINAL 03/17/2018   GRAMSTAIN  03/17/2018    ABUNDANT WBC PRESENT,BOTH PMN AND MONONUCLEAR RARE GRAM POSITIVE COCCI    CULT  03/17/2018    No growth aerobically or anaerobically. Performed at [facility], [address] [place], [place] [address]      Lab Results  Component Value Date   ALBUMIN 3.0 (L) 03/14/2018   ALBUMIN 3.9 04/20/2017    No results found for: MG No results found for: VD25OH  No results found for: PREALBUMIN CBC EXTENDED Latest Ref Rng & Units 03/20/2018 03/19/2018 03/18/2018  WBC 4.0 - 10.5 K/uL 6.6 6.1 9.5  RBC 4.22 - 5.81 MIL/uL 4.45 4.26 4.16(L)  HGB 13.0 - 17.0 g/dL 82.913.2 12.8(L) 12.1(L)  HCT 39.0 - 52.0 % 41.7 40.2 39.0  PLT 150 - 400 K/uL 371 363 349  NEUTROABS 1.7 - 7.7 K/uL - - -  LYMPHSABS 0.7 - 4.0 K/uL - - -     Body mass index is 47 kg/m.  Orders:  Orders Placed This Encounter  Procedures  . MR Shoulder Left w/o contrast   No orders of the defined types were placed in this encounter.    Procedures: No procedures performed  Clinical Data: No additional findings.  ROS:  All other systems negative, except as noted in the HPI. Review of Systems  Objective: Vital Signs: Ht 5\' 11"  (1.803 m)   Wt (!) 337 lb (152.9 kg)   BMI 47.00 kg/m   Specialty Comments:  No specialty comments available.  PMFS History: Patient Active Problem List   Diagnosis Date Noted  . Abrasion, right knee, initial encounter   . Wound infection 03/15/2018  . Cellulitis of  right anterior lower leg 03/15/2018  . History of pulmonary embolus (PE) 03/15/2018  . Cellulitis 03/14/2018  . S/P ankle fusion 04/20/2017  . Post-traumatic osteoarthritis, left ankle and foot   . 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 Impingement of left ankle joint 07/14/2016  . Ankle impingement syndrome, left   . History of total right knee replacement 03/09/2016  . Pain in left ankle and joints of left foot 03/09/2016   Past Medical History:  Diagnosis Date  . Anxiety   . Depression   . DVT (deep venous thrombosis) (HCC)    right clavicle and LLE  . Factor V Leiden ([place])   . Fractures   . Frontal lobe deficit   . Hirschsprung's disease   . Hypertension   . Impingement syndrome of left ankle   . MRSA infection    left hip   . OA (osteoarthritis)    left ankle  . Sleep apnea    wears CPAP    Family History  Problem Relation Age of Onset  . Heart attack Father     Past Surgical History:  Procedure Laterality Date  . ANKLE ARTHROSCOPY Left 07/14/2016   Procedure: LEFT ANKLE ARTHROSCOPY AND DEBRIDEMENT;  Surgeon: Newt Minion, MD;  Location: [place];  Service: Orthopedics;  Laterality: Left;  . ANKLE FUSION Left 04/20/2017   Procedure: LEFT TIBIOCALCANEAL FUSION;  Surgeon: Newt Minion, MD;  Location: [place];  Service: Orthopedics;  Laterality: Left;  . COLON SURGERY     Mega colon  . COLONOSCOPY    . HIP SURGERY Left    At [facility]  . I&D EXTREMITY Right 03/17/2018   Procedure: IRRIGATION AND DEBRIDEMENT RIGHT KNEE;  Surgeon: Newt Minion, MD;  Location: [place];  Service: Orthopedics;  Laterality: Right;  . KNEE ARTHROSCOPY    . LEFT ANKLE SCOPE WITH DEBRIDMENT  Left 07/14/2016   Social History   Occupational History  . Not on file  Tobacco Use  . Smoking status: Current Every Day Smoker    Packs/day: 0.50    Years: 20.00    Pack years: 10.00    Types: Cigarettes  . Smokeless tobacco: Never Used  Substance and Sexual Activity  . Alcohol use: Not  Currently  . Drug use: Yes    Types: Marijuana    Comment: hasn't used any in 1 mth  . Sexual activity: Not on file

## 2018-08-04 DIAGNOSIS — E8881 Metabolic syndrome: Secondary | ICD-10-CM | POA: Diagnosis not present

## 2018-08-04 DIAGNOSIS — I2699 Other pulmonary embolism without acute cor pulmonale: Secondary | ICD-10-CM | POA: Diagnosis not present

## 2018-08-04 DIAGNOSIS — E291 Testicular hypofunction: Secondary | ICD-10-CM | POA: Diagnosis not present

## 2018-08-04 DIAGNOSIS — M159 Polyosteoarthritis, unspecified: Secondary | ICD-10-CM | POA: Diagnosis not present

## 2018-08-04 DIAGNOSIS — R609 Edema, unspecified: Secondary | ICD-10-CM | POA: Diagnosis not present

## 2018-08-04 DIAGNOSIS — N5089 Other specified disorders of the male genital organs: Secondary | ICD-10-CM | POA: Diagnosis not present

## 2018-08-04 DIAGNOSIS — K219 Gastro-esophageal reflux disease without esophagitis: Secondary | ICD-10-CM | POA: Diagnosis not present

## 2018-08-04 DIAGNOSIS — K589 Irritable bowel syndrome without diarrhea: Secondary | ICD-10-CM | POA: Diagnosis not present

## 2018-08-04 DIAGNOSIS — D6851 Activated protein C resistance: Secondary | ICD-10-CM | POA: Diagnosis not present

## 2018-08-04 DIAGNOSIS — Z7901 Long term (current) use of anticoagulants: Secondary | ICD-10-CM | POA: Diagnosis not present

## 2018-08-04 DIAGNOSIS — F419 Anxiety disorder, unspecified: Secondary | ICD-10-CM | POA: Diagnosis not present

## 2018-08-08 ENCOUNTER — Encounter: Payer: Self-pay | Admitting: Orthopedic Surgery

## 2018-08-25 DIAGNOSIS — N5089 Other specified disorders of the male genital organs: Secondary | ICD-10-CM | POA: Diagnosis not present

## 2018-08-25 DIAGNOSIS — F17201 Nicotine dependence, unspecified, in remission: Secondary | ICD-10-CM | POA: Diagnosis not present

## 2018-08-25 DIAGNOSIS — F419 Anxiety disorder, unspecified: Secondary | ICD-10-CM | POA: Diagnosis not present

## 2018-08-25 DIAGNOSIS — K589 Irritable bowel syndrome without diarrhea: Secondary | ICD-10-CM | POA: Diagnosis not present

## 2018-08-25 DIAGNOSIS — E291 Testicular hypofunction: Secondary | ICD-10-CM | POA: Diagnosis not present

## 2018-08-25 DIAGNOSIS — D6851 Activated protein C resistance: Secondary | ICD-10-CM | POA: Diagnosis not present

## 2018-08-25 DIAGNOSIS — Z7901 Long term (current) use of anticoagulants: Secondary | ICD-10-CM | POA: Diagnosis not present

## 2018-08-25 DIAGNOSIS — G4733 Obstructive sleep apnea (adult) (pediatric): Secondary | ICD-10-CM | POA: Diagnosis not present

## 2018-08-25 DIAGNOSIS — I2699 Other pulmonary embolism without acute cor pulmonale: Secondary | ICD-10-CM | POA: Diagnosis not present

## 2018-08-25 DIAGNOSIS — R609 Edema, unspecified: Secondary | ICD-10-CM | POA: Diagnosis not present

## 2018-08-25 DIAGNOSIS — K219 Gastro-esophageal reflux disease without esophagitis: Secondary | ICD-10-CM | POA: Diagnosis not present

## 2018-08-25 DIAGNOSIS — E8881 Metabolic syndrome: Secondary | ICD-10-CM | POA: Diagnosis not present

## 2018-09-01 DIAGNOSIS — D6851 Activated protein C resistance: Secondary | ICD-10-CM | POA: Diagnosis not present

## 2018-09-01 DIAGNOSIS — F17201 Nicotine dependence, unspecified, in remission: Secondary | ICD-10-CM | POA: Diagnosis not present

## 2018-09-01 DIAGNOSIS — I82409 Acute embolism and thrombosis of unspecified deep veins of unspecified lower extremity: Secondary | ICD-10-CM | POA: Diagnosis not present

## 2018-09-01 DIAGNOSIS — I2699 Other pulmonary embolism without acute cor pulmonale: Secondary | ICD-10-CM | POA: Diagnosis not present

## 2018-09-01 DIAGNOSIS — E291 Testicular hypofunction: Secondary | ICD-10-CM | POA: Diagnosis not present

## 2018-09-01 DIAGNOSIS — G4733 Obstructive sleep apnea (adult) (pediatric): Secondary | ICD-10-CM | POA: Diagnosis not present

## 2018-09-01 DIAGNOSIS — F419 Anxiety disorder, unspecified: Secondary | ICD-10-CM | POA: Diagnosis not present

## 2018-09-01 DIAGNOSIS — K219 Gastro-esophageal reflux disease without esophagitis: Secondary | ICD-10-CM | POA: Diagnosis not present

## 2018-09-01 DIAGNOSIS — N5089 Other specified disorders of the male genital organs: Secondary | ICD-10-CM | POA: Diagnosis not present

## 2018-09-01 DIAGNOSIS — E8881 Metabolic syndrome: Secondary | ICD-10-CM | POA: Diagnosis not present

## 2018-09-01 DIAGNOSIS — K589 Irritable bowel syndrome without diarrhea: Secondary | ICD-10-CM | POA: Diagnosis not present

## 2018-09-01 DIAGNOSIS — R609 Edema, unspecified: Secondary | ICD-10-CM | POA: Diagnosis not present

## 2018-09-02 ENCOUNTER — Other Ambulatory Visit: Payer: PPO

## 2018-09-04 ENCOUNTER — Telehealth: Payer: Self-pay | Admitting: Orthopedic Surgery

## 2018-09-04 NOTE — Telephone Encounter (Signed)
Patient was called and lvm for him to call back and inform staff of cancellation and or one of Dr Jess Barters assistants will contact him and follow-up.

## 2018-09-04 NOTE — Telephone Encounter (Signed)
Patient called advised he is canceling his MRI for now and asked for a call back to explain this to Dr Sharol Given. The number to contact patient is [phone]

## 2018-09-07 ENCOUNTER — Ambulatory Visit: Payer: PPO | Admitting: Orthopedic Surgery

## 2018-09-20 ENCOUNTER — Other Ambulatory Visit: Payer: Self-pay | Admitting: Orthopedic Surgery

## 2018-10-03 DIAGNOSIS — E291 Testicular hypofunction: Secondary | ICD-10-CM | POA: Diagnosis not present

## 2018-10-03 DIAGNOSIS — I2699 Other pulmonary embolism without acute cor pulmonale: Secondary | ICD-10-CM | POA: Diagnosis not present

## 2018-10-03 DIAGNOSIS — E785 Hyperlipidemia, unspecified: Secondary | ICD-10-CM | POA: Diagnosis not present

## 2018-10-03 DIAGNOSIS — F419 Anxiety disorder, unspecified: Secondary | ICD-10-CM | POA: Diagnosis not present

## 2018-10-03 DIAGNOSIS — Z7901 Long term (current) use of anticoagulants: Secondary | ICD-10-CM | POA: Diagnosis not present

## 2018-10-03 DIAGNOSIS — E7800 Pure hypercholesterolemia, unspecified: Secondary | ICD-10-CM | POA: Diagnosis not present

## 2018-10-03 DIAGNOSIS — D6851 Activated protein C resistance: Secondary | ICD-10-CM | POA: Diagnosis not present

## 2018-10-03 DIAGNOSIS — K589 Irritable bowel syndrome without diarrhea: Secondary | ICD-10-CM | POA: Diagnosis not present

## 2018-10-03 DIAGNOSIS — R609 Edema, unspecified: Secondary | ICD-10-CM | POA: Diagnosis not present

## 2018-10-03 DIAGNOSIS — Z6841 Body Mass Index (BMI) 40.0 and over, adult: Secondary | ICD-10-CM | POA: Diagnosis not present

## 2018-10-03 DIAGNOSIS — Z0000 Encounter for general adult medical examination without abnormal findings: Secondary | ICD-10-CM | POA: Diagnosis not present

## 2018-10-03 DIAGNOSIS — I10 Essential (primary) hypertension: Secondary | ICD-10-CM | POA: Diagnosis not present

## 2018-10-03 DIAGNOSIS — Z1331 Encounter for screening for depression: Secondary | ICD-10-CM | POA: Diagnosis not present

## 2018-10-16 ENCOUNTER — Ambulatory Visit
Admission: RE | Admit: 2018-10-16 | Discharge: 2018-10-16 | Disposition: A | Discharge status: Home / Self Care | Payer: PPO | Source: Ambulatory Visit | Attending: Physician Assistant | Admitting: Physician Assistant

## 2018-10-16 ENCOUNTER — Other Ambulatory Visit: Payer: Self-pay

## 2018-10-16 DIAGNOSIS — Z23 Encounter for immunization: Secondary | ICD-10-CM | POA: Diagnosis not present

## 2018-10-16 DIAGNOSIS — Z7901 Long term (current) use of anticoagulants: Secondary | ICD-10-CM | POA: Diagnosis not present

## 2018-10-16 DIAGNOSIS — I2699 Other pulmonary embolism without acute cor pulmonale: Secondary | ICD-10-CM | POA: Diagnosis not present

## 2018-10-16 DIAGNOSIS — N5089 Other specified disorders of the male genital organs: Secondary | ICD-10-CM | POA: Diagnosis not present

## 2018-10-16 DIAGNOSIS — D6851 Activated protein C resistance: Secondary | ICD-10-CM | POA: Diagnosis not present

## 2018-10-16 DIAGNOSIS — F17201 Nicotine dependence, unspecified, in remission: Secondary | ICD-10-CM | POA: Diagnosis not present

## 2018-10-16 DIAGNOSIS — K219 Gastro-esophageal reflux disease without esophagitis: Secondary | ICD-10-CM | POA: Diagnosis not present

## 2018-10-16 DIAGNOSIS — E8881 Metabolic syndrome: Secondary | ICD-10-CM | POA: Diagnosis not present

## 2018-10-16 DIAGNOSIS — K589 Irritable bowel syndrome without diarrhea: Secondary | ICD-10-CM | POA: Diagnosis not present

## 2018-10-16 DIAGNOSIS — M75112 Incomplete rotator cuff tear or rupture of left shoulder, not specified as traumatic: Secondary | ICD-10-CM | POA: Diagnosis not present

## 2018-10-16 DIAGNOSIS — R609 Edema, unspecified: Secondary | ICD-10-CM | POA: Diagnosis not present

## 2018-10-16 DIAGNOSIS — M19012 Primary osteoarthritis, left shoulder: Secondary | ICD-10-CM | POA: Diagnosis not present

## 2018-10-16 DIAGNOSIS — F419 Anxiety disorder, unspecified: Secondary | ICD-10-CM | POA: Diagnosis not present

## 2018-10-16 DIAGNOSIS — E291 Testicular hypofunction: Secondary | ICD-10-CM | POA: Diagnosis not present

## 2018-10-16 DIAGNOSIS — G8929 Other chronic pain: Secondary | ICD-10-CM

## 2018-10-20 ENCOUNTER — Encounter: Payer: Self-pay | Admitting: Orthopedic Surgery

## 2018-10-24 ENCOUNTER — Telehealth: Payer: Self-pay | Admitting: Orthopedic Surgery

## 2018-10-24 NOTE — Telephone Encounter (Signed)
I called patient regarding the findings of his MRI scan.  Reviewed that he does have partial tearing of the rotator cuff biceps tendon and labrum and that there is arthritis of the glenohumeral joint.  Discussed that physical therapy and injections are the first line of treatment patient states he is undergone both therapy and injections without relief.  Discussed that we could consider arthroscopic intervention that his improvement may be 50 to 75% but this would not completely relieve his symptoms and his shoulder symptoms may be unchanged.  Patient states he understands wished to proceed with arthroscopic surgery.  He does have a history of sleep apnea he has not used a CPAP machine for 4 years will plan on surgery at the [facility] main OR.

## 2018-10-25 ENCOUNTER — Encounter: Payer: Self-pay | Admitting: Orthopedic Surgery

## 2018-11-16 ENCOUNTER — Encounter: Payer: Self-pay | Admitting: Orthopedic Surgery

## 2018-12-03 ENCOUNTER — Other Ambulatory Visit: Payer: Self-pay | Admitting: Orthopedic Surgery

## 2019-02-12 ENCOUNTER — Telehealth: Payer: Self-pay | Admitting: Orthopaedic Surgery

## 2019-02-19 LAB — PROTIME-INR
INR: 1.3 — ABNORMAL LOW (ref 1.5–3.5)
Protime: 15.7 seconds — ABNORMAL HIGH (ref 11.6–14.5)

## 2019-02-20 LAB — COMPREHENSIVE METABOLIC PANEL
ALT: 38 U/L (ref 0–41)
AST: 31 U/L (ref 0–40)
Albumin/Globulin Ratio: 2 mmol/L (ref 1.00–2.00)
Albumin: 4.5 g/dL (ref 3.5–5.2)
Alk Phosphatase: 78 U/L (ref 40–130)
Anion Gap: 9 mmol/L (ref 2–17)
BUN: 19 mg/dL (ref 6–20)
CO2: 25 mmol/L (ref 22–29)
Calcium: 9.4 mg/dL (ref 8.6–10.0)
Chloride: 104 mmol/L (ref 98–107)
Creatinine: 0.6 mg/dL — ABNORMAL LOW (ref 0.7–1.3)
GFR African American: 129 mL/min/{1.73_m2} (ref >=90)
GFR Non-African American: 112 mL/min/{1.73_m2} (ref >=90)
Globulin: 2 g/dL (ref 1.9–4.4)
Glucose: 97 mg/dL (ref 70–99)
OSMOLALITY CALCULATED: 278 mOsm/kg (ref 270–287)
Potassium: 4.7 mmol/L (ref 3.5–5.3)
Sodium: 138 mmol/L (ref 135–145)
Total Bilirubin: 0.2 mg/dL (ref 0.00–1.20)
Total Protein: 6.8 g/dL (ref 6.4–8.3)

## 2019-02-20 LAB — TSH: TSH, 3RD GENERATION: 1.6 mcIU/mL (ref 0.358–3.740)

## 2019-02-20 LAB — VITAMIN B12: Vitamin B-12: 361 pg/mL (ref 232–1245)

## 2019-02-20 LAB — CBC WITH AUTO DIFFERENTIAL
Absolute Baso #: 0 10*3/uL (ref 0.0–0.2)
Absolute Eos #: 0.1 10*3/uL (ref 0.0–0.5)
Absolute Lymph #: 1.5 10*3/uL (ref 1.0–3.2)
Absolute Mono #: 0.7 10*3/uL (ref 0.3–1.0)
Basophils %: 0.2 % (ref 0.0–2.0)
Eosinophils %: 1.6 % (ref 0.0–7.0)
Hematocrit: 46.3 % (ref 38.0–52.0)
Hemoglobin: 15.2 g/dL (ref 13.0–17.3)
Immature Grans (Abs): 0.02 10*3/uL (ref 0.00–0.06)
Immature Granulocytes: 0.2 % (ref 0.1–0.6)
Lymphocytes: 18.2 % (ref 15.0–45.0)
MCH: 31.2 pg (ref 27.0–34.5)
MCHC: 32.8 g/dL (ref 32.0–36.0)
MCV: 95.1 fL (ref 84.0–100.0)
MPV: 9.3 fL (ref 7.2–13.2)
Monocytes: 8.4 % (ref 4.0–12.0)
NRBC Absolute: 0 10*3/uL (ref 0.000–0.012)
NRBC Automated: 0 % (ref 0.0–0.2)
Neutrophils %: 71.4 % (ref 42.0–74.0)
Neutrophils Absolute: 6 10*3/uL (ref 1.6–7.3)
Platelets: 282 10*3/uL (ref 140–440)
RBC: 4.87 x10e6/mcL (ref 4.00–5.60)
RDW: 14.1 % (ref 11.0–16.0)
WBC: 8.4 10*3/uL (ref 3.8–10.6)

## 2019-02-20 LAB — T3: T3, Total: 138 ng/dL (ref 80–200)

## 2019-02-20 LAB — T4, FREE: T4 Free: 0.87 ng/dL (ref 0.82–1.70)

## 2019-02-20 LAB — FOLATE: Folate: 16.84 ng/mL (ref 4.80–24.20)

## 2019-03-06 LAB — PSA, FREE
PSA, Free Pct: 17.6 %
PSA, Free: 0.22 ng/mL (ref 0.000–0.500)
PSA: 1.25 ng/mL (ref 0.000–4.000)

## 2019-05-18 LAB — PROTIME-INR
INR: 2.3 (ref 1.5–3.5)
Protime: 25.9 seconds — ABNORMAL HIGH (ref 11.6–14.5)

## 2019-05-30 LAB — COVID-19: SARS COV2, NAA (BD): NOT DETECTED

## 2019-05-31 LAB — PROTIME-INR
INR: 0.9 — ABNORMAL LOW (ref 1.5–3.5)
Protime: 12.9 seconds (ref 11.6–14.5)

## 2019-05-31 NOTE — Op Note (Signed)
Phase II Record - SFPM             Phase II Record - SFPM Summary                                                                  Primary Physician:        Rondall Allegra W-MD    Case Number:              [id number]    Finalized Date/Time:      05/31/19 10:20:56    Pt. Name:                 Dylan Blankenship, Dylan Blankenship    D.O.B./Sex:               05/30/1960    Male    Med Rec #:                [id number]    Physician:                Rondall Allegra W-MD    Financial #:              [id number]    Pt. Type:                 O    Room/Bed:                 /    Admit/Disch:              05/31/19 08:00:00 -                              05/31/19 09:50:36    Institution:       SFPM Case Time Phase II                                                                                                   Entry 1                                                                                                          Phase II In               05/31/19 09:43:00               Phase II Out  05/31/19 09:50:00    Phase II Discharge        05/31/19 09:50:00    Time     Last Modified By:         Joaquin Bend, RN, MICHELE L                              05/31/19 09:50:30    General Comments:            Pain journal explained and given to pt for completion.              Finalized By: Joaquin Bend, RN, [place] L      Document Signatures                                                                             Signed By:           Joaquin Bend RN, [place] L 05/31/19 09:50          Joaquin Bend, RN, [place] L 05/31/19 10:20      Unfinalized History                                                                                     Date/Time            Username    Reason for Unfinalizing         Freetext Reason for Unfinalizing                                          05/31/19 10:20       [id number]     Finish Documentation

## 2019-05-31 NOTE — Nursing Note (Signed)
Nursing Discharge Summary - Text       Physician Discharge Summary Entered On:  05/31/2019 6:56 EDT    Performed On:  05/31/2019 6:56 EDT by Durene Cal, RN, [facility] L               DC Information   Provider Instructions for Diet :   A Healthy Diet   Provider Instructions for Activity :   Gradually resume your normal activity, Limit your activity for 24hrs, May shower, No Baths/Hot Tubs/Oceans/ or Pools, No bending, No bending, twisting or lifting, No driving, No lifting   Durene Cal, RN, DEBORAH L - 05/31/2019 6:56 EDT

## 2019-05-31 NOTE — Procedures (Signed)
Procedure Record - SFPM             Procedure Record - SFPM Summary                                                                 Primary Physician:        Rondall Allegra W-MD    Case Number:              [id number]    Finalized Date/Time:      05/31/19 09:39:41    Pt. Name:                 Dylan Blankenship, Dylan Blankenship    D.O.B./Sex:               [birth year]    Male    Med Rec #:                [id number]    Physician:                Rondall Allegra W-MD    Financial #:              [id number]    Pt. Type:                 O    Room/Bed:                 /    Admit/Disch:              05/31/19 08:00:00 -    Institution:       [facility] - Case Attendance                                                                                                    Entry 1                         Entry 2                         Entry 3                                          Case Attendee             RANDALL,  DERRICK W-MD          Gertie Baron, RN, KAY A              CHILDS,  JOHN A    Role Performed            Art gallery manager  Radiology Tech    Time In                   05/31/19 09:30:00               05/31/19 09:30:00               05/31/19 09:30:00    Time Out                  05/31/19 09:39:00               05/31/19 09:39:00               05/31/19 09:39:00    Procedure                 Medial Branch Block             Medial Branch Block             Medial Branch Block                              Lumbar(Bilateral)               Lumbar(Bilateral)               Lumbar(Bilateral)    Last Modified By:         Gertie Baron, RN, Hortencia Conradi, RN, Hortencia Conradi, RN, KAY A                              05/31/19 09:39:37               05/31/19 09:39:37               05/31/19 09:39:37      SFPM - Case Attendance Audit                                                                     05/31/19 09:39:37         Owner: Renee Rival                             Modifier: KIERSPEK                                                           1     <+> Time Out            1     <*> Procedure                              Medial Branch Block Lumbar(Bilateral)            2     <+> Time In  2     <+> Time Out            2     <*> Procedure                              Medial Branch Block Lumbar(Bilateral)            3     <+> Time In            3     <+> Time Out            3     <*> Procedure                              Medial Branch Block Lumbar(Bilateral)     05/31/19 09:31:30         Owner: Renee Rival                             Modifier: KIERSPEK                                                          1     <+> Time In            1     <*> Procedure                              Medial Branch Block Lumbar(Bilateral)        <+> 2         Case Attendee        <+> 2         Role Performed        <+> 2         Procedure        <+> 3         Case Attendee        <+> 3         Role Performed        <+> 3         Procedure        SFPM - Case Times                                                                                                         Entry 1  Patient      In Room Time             05/31/19 09:30:00               Out Room Time                   05/31/19 09:39:00    Anesthesia     Procedure      Start Time               05/31/19 09:33:00               Stop Time                       05/31/19 09:39:00    Last Modified By:         Delma Freeze, KAY A                              05/31/19 09:39:30      SFPM - Case Times Audit                                                                          05/31/19 09:39:30         Owner: Renee Rival                             Modifier: Renee Rival                                                      <+> 1         Out Room Time        <+> 1         Stop Time     05/31/19 09:37:04         Owner: Renee Rival                             Modifier: Renee Rival                                                       <+> 1         Start Time        SFPM - General Case Data  Entry 1                                                                                                          Case Information      ASA Class                N/A                             Case Level                      None     OR                       SF PM 01                        Specialty                       Pain Management (SN)     Wound Class              1-Clean    Preop Diagnosis           M47.816                         Postop Diagnosis                m47.816    Last Modified By:         Gertie BaronKIERSPE, RN, KAY A                              05/31/19 09:32:48      SFPM - General Case Data Audit                                                                   05/31/19 09:32:48         Owner: Renee RivalKIERSPEK                             Modifier: KIERSPEK                                                      <+> 1         ASA Class        SFPM - Procedures  Entry 1                                                                                                          Procedure     Description      Procedure                Medial Branch Block             Modifiers                       Bilateral                              Lumbar     Surgical Procedure       LUMBAR MEDIAL BRANCH     Text                     BLOCK L4-L5; L5-S1    Primary Procedure         Yes                             Primary Surgeon                 Rondall Allegra W-MD    Start                     05/31/19 09:33:00               Stop                            05/31/19 09:39:00    Anesthesia Type           Local                           Surgical Service                Pain Management (SN)    Wound Class               1-Clean    Last Modified By:          Gertie Baron, RN, KAY A                              05/31/19 09:39:32      SFPM - Procedures Audit                                                                          05/31/19 09:39:32  Owner: Renee Rival                             ModifierRenee Rival                                                      <+> 1         Start        <+> 1         Stop        SFPM - Dressing/Packing                                                                                                   Entry 1                                                                                                          Site                      Back    Dressing Item     Details      Dressing Item            Band-Aid     (Im.290)     Last Modified By:         Gertie Baron, RN, KAY A                              05/31/19 09:31:41      SFPM - Procedure Setup                                                                                                    Entry 1  Body Position             Prone                           Prep Agents (Im.270)            Chlorhexidine Gluconate                                                                                              2% w/Alcohol    Skin Prep Agent Dry       Yes                             Equipment Used                  O2 Sat    Without Pooling     Vital signs               Yes    completed and     patient reassessed     prior to sedation     Last Modified By:         Gertie Baron, RN, KAY A                              05/31/19 09:32:38      SFPM - Time Out - Procedure                                                                                               Entry 1                                                                                                          Procedure                 Medial Branch Block             Patient name and                Yes  Lumbar(Bilateral)               DOB confirmed     Surgical procedure        Yes                             Correct surgical                Yes    to be performed                                           site marked and     confirmed and                                             initials are     verified by                                               visible through     completed surgical                                        prepped and draped     consent                                                   field (or                                                               alternative ID band                                                               used), if applicable     Allergies discussed       Yes                             Anticoagulation                 Yes                                                              status confirmed     Time Out Complete  05/31/19 09:30:00    Last Modified By:         Mylo Red RN, KAY A                              05/31/19 09:30:37      SFPM - Debrief - Procedure                                                                                                Entry 1                                                                                                          Procedure                 Medial Branch Block             Actual procedure                Yes                              Lumbar(Bilateral)               performed confirmed     Confirm specimens         Yes                             Patient recovery                Yes    and specimens                                             plan confirmed     labeled     appropriately (if     applicable)     Debrief Complete          05/31/19 09:37:00    Last Modified By:         Mylo Red RN, KAY A                              05/31/19 09:37:12      Case Comments                                                                                         <  None>              Finalized By: Gertie Baron, RN,  KAY A      Document Signatures                                                                             Signed By:           Gertie Baron RN, KAY A 05/31/19 09:39

## 2019-05-31 NOTE — H&P (Signed)
History & Physical  Pre-Procedure        Patient:   DOUGLES, KIMMEY             MRN: [id number]            FIN: [phone]               Age:   59 years     Sex:  Male     DOB:  Jul 05, 1960   Associated Diagnoses:   None   Author:   Rondall Allegra W-MD      Date: 05/31/2019    DX: Lumbar facet syndrome     PROCEDURE: Three-level lumbar medial branch block bilaterally      Patient is here for a therapeutic pain procedure with fluoroscopy.    Vitals: See nursing notes    Mental status: Alert and oriented x3    Cardiovascular: Regular rate and rhythm    Lungs: Clear to auscultation bilaterally    Neuro: Nonfocal    Assessment:  Patient here for therapeutic pain procedure.     Plan:  Will plan to proceed with scheduled pain procedure        Signature Line     Electronically Signed on 05/31/2019 05:36 PM EDT   ________________________________________________   Rondall Allegra W-MD

## 2019-05-31 NOTE — Nursing Note (Signed)
Adult Admission Assessment - Text       Perioperative Admission Assessment Entered On:  05/31/2019 8:42 EDT    Performed On:  05/31/2019 8:40 EDT by Freida Busman, RN, [place] A               Allergies   (As Of: 05/31/2019 08:42:52 EDT)   Allergies (Active)   No Known Allergies  Estimated Onset Date:   Unspecified ; Created By:   Durene Cal, RN, DEBORAH L; Reaction Status:   Active ; Category:   Drug ; Substance:   No Known Allergies ; Type:   Allergy ; Updated By:   Reatha Armour; Reviewed Date:   05/31/2019 8:40 EDT        Medication History   Medication List   (As Of: 05/31/2019 08:42:52 EDT)   Normal Order    diazePAM 5 mg Tab  :   diazePAM 5 mg Tab ; Status:   Ordered ; Ordered As Mnemonic:   Valium ; Simple Display Line:   10 mg, 2 tabs, Oral, On Call, PRN: agitation ; Ordering Provider:   Rondall Allegra W-MD; Catalog Code:   diazepam ; Order Dt/Tm:   05/31/2019 08:10:07 EDT            Home Meds    acetaminophen  :   acetaminophen ; Status:   Documented ; Ordered As Mnemonic:   Tylenol ; Simple Display Line:   Oral, 0 Refill(s) ; Catalog Code:   acetaminophen ; Order Dt/Tm:   05/30/2019 10:04:01 EDT          ARIPiprazole  :   ARIPiprazole ; Status:   Documented ; Ordered As Mnemonic:   Abilify 5 mg oral tablet ; Simple Display Line:   mg, tabs, Oral, Daily, 0 Refill(s) ; Catalog Code:   ARIPiprazole ; Order Dt/Tm:   05/30/2019 10:04:19 EDT          atorvastatin  :   atorvastatin ; Status:   Documented ; Ordered As Mnemonic:   atorvastatin 20 mg oral tablet ; Simple Display Line:   0 Refill(s) ; Catalog Code:   atorvastatin ; Order Dt/Tm:   05/30/2019 10:05:31 EDT          FLUoxetine  :   FLUoxetine ; Status:   Documented ; Ordered As Mnemonic:   FLUoxetine 40 mg oral capsule ; Simple Display Line:   0 Refill(s) ; Catalog Code:   FLUoxetine ; Order Dt/Tm:   05/30/2019 10:05:31 EDT          gabapentin  :   gabapentin ; Status:   Documented ; Ordered As Mnemonic:   gabapentin 800 mg oral tablet ; Simple Display Line:   0 Refill(s) ;  Catalog Code:   gabapentin ; Order Dt/Tm:   05/30/2019 10:05:30 EDT          Misc Medication  :   Misc Medication ; Status:   Documented ; Ordered As Mnemonic:   VENLAFAXINE ER 225MG  TABLETS ; Simple Display Line:   0 Refill(s) ; Catalog Code:   Misc Medication ; Order Dt/Tm:   05/30/2019 10:05:31 EDT          oxybutynin  :   oxybutynin ; Status:   Documented ; Ordered As Mnemonic:   oxybutynin 5 mg/24 hours oral tablet, extended release ; Simple Display Line:   0 Refill(s) ; Catalog Code:   oxybutynin ; Order Dt/Tm:   05/30/2019 10:05:31 EDT  traZODone  :   traZODone ; Status:   Documented ; Ordered As Mnemonic:   traZODone 100 mg oral tablet ; Simple Display Line:   0 Refill(s) ; Catalog Code:   traZODone ; Order Dt/Tm:   05/30/2019 10:05:30 EDT          warfarin  :   warfarin ; Status:   Documented ; Ordered As Mnemonic:   warfarin 10 mg oral tablet ; Simple Display Line:   0 Refill(s) ; Catalog Code:   warfarin ; Order Dt/Tm:   05/30/2019 10:05:30 EDT          warfarin  :   warfarin ; Status:   Documented ; Ordered As Mnemonic:   warfarin 7.5 mg oral tablet ; Simple Display Line:   0 Refill(s) ; Catalog Code:   warfarin ; Order Dt/Tm:   05/30/2019 10:05:30 EDT            Problem History   (As Of: 05/31/2019 08:42:52 EDT)   Problems(Active)    Anxiety (SNOMED CT  :61607371 )  Name of Problem:   Anxiety ; Recorder:   HUNTER, RN, DEBORAH L; Confirmation:   Confirmed ; Classification:   Patient Stated ; Code:   06269485 ; Contributor System:   PowerChart ; Last Updated:   05/30/2019 10:06 EDT ; Life Cycle Date:   05/30/2019 ; Life Cycle Status:   Active ; Vocabulary:   SNOMED CT        Bipolar affective (SNOMED CT  :46270350 )  Name of Problem:   Bipolar affective ; Recorder:   HUNTER, RN, DEBORAH L; Confirmation:   Confirmed ; Classification:   Patient Stated ; Code:   09381829 ; Contributor System:   PowerChart ; Last Updated:   05/30/2019 10:08 EDT ; Life Cycle Date:   05/30/2019 ; Life Cycle Status:   Active ; Vocabulary:    SNOMED CT        Depressive disorder (SNOMED CT  :93716967 )  Name of Problem:   Depressive disorder ; Recorder:   HUNTER, RN, DEBORAH L; Confirmation:   Confirmed ; Classification:   Patient Stated ; Code:   89381017 ; Contributor System:   Dietitian ; Last Updated:   05/30/2019 10:06 EDT ; Life Cycle Date:   05/30/2019 ; Life Cycle Status:   Active ; Vocabulary:   SNOMED CT        Factor V Leiden (SNOMED CT  :510258527 )  Name of Problem:   Factor V Leiden ; Recorder:   HUNTER, RN, DEBORAH L; Confirmation:   Confirmed ; Classification:   Patient Stated ; Code:   782423536 ; Contributor System:   PowerChart ; Last Updated:   05/30/2019 10:08 EDT ; Life Cycle Date:   05/30/2019 ; Life Cycle Status:   Active ; Vocabulary:   SNOMED CT        IBS (irritable bowel syndrome) (SNOMED CT  :14431540 )  Name of Problem:   IBS (irritable bowel syndrome) ; Recorder:   HUNTER, RN, DEBORAH L; Confirmation:   Confirmed ; Classification:   Patient Stated ; Code:   08676195 ; Contributor System:   PowerChart ; Last Updated:   05/30/2019 10:06 EDT ; Life Cycle Date:   05/30/2019 ; Life Cycle Status:   Active ; Vocabulary:   SNOMED CT        Low back pain (SNOMED CT  :093267124 )  Name of Problem:   Low back pain ; Recorder:   HUNTER, RN, [facility]  L; Confirmation:   Confirmed ; Classification:   Patient Stated ; Code:   [id number] ; Contributor System:   PowerChart ; Last Updated:   05/30/2019 10:05 EDT ; Life Cycle Date:   05/30/2019 ; Life Cycle Status:   Active ; Vocabulary:   SNOMED CT          Diagnoses(Active)    Encounter for preprocedural laboratory examination  Date:   05/31/2019 ; Confirmation:   Confirmed ; Clinical Dx:   Encounter for preprocedural laboratory examination ; Classification:   Medical ; Clinical Service:   Non-Specified ; Code:   ICD-10-CM ; Probability:   0 ; Diagnosis Code:   X41.287        Procedure History        -    Procedure History   (As Of: 05/31/2019 08:42:53 EDT)     Anesthesia Minutes:   0 ; 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 ; Procedure Minutes:   0 ; Last Reviewed Dt/Tm:   05/31/2019 08:41:28 EDT            Procedure Dt/Tm:   12/26/2011 ; Anesthesia Minutes:   0 ; Procedure Name:   Colonoscopy ; Procedure Minutes:   0 ; Last Reviewed Dt/Tm:   05/31/2019 08:41:28 EDT            Anesthesia Minutes:   0 ; Procedure Name:   Vasectomy ; Procedure Minutes:   0 ; Last Reviewed Dt/Tm:   05/31/2019 08:41:28 EDT            Anesthesia Minutes:   0 ; Procedure Name:   Bilateral Arthroscopy of Knee ; Procedure Minutes:   0 ; Last Reviewed Dt/Tm:   05/31/2019 08:41:28 EDT            Anesthesia Minutes:   0 ; Procedure Name:   Left Hip Replacement x 2 ; Procedure Minutes:   0 ; Last Reviewed Dt/Tm:   05/31/2019 08:41:28 EDT            Anesthesia Minutes:   0 ; Procedure Name:   Left Ankle Fusion ; Procedure Minutes:   0 ; Comments:     05/30/2019 10:14 EDT - Durene Cal, RN, DEBORAH L  03/2017 ; Last Reviewed Dt/Tm:   05/31/2019 08:41:28 EDT            History Confirmation   Problem History Changes PAT :   No   Procedure History Changes PAT :   No   Freida Busman RN, [place] A - 05/31/2019 8:40 EDT   Bloodless Medicine   Is Blood Transfusion Acceptable to Patient :   Yes   Freida Busman, RN, Sandria Bales A - 05/31/2019 8:40 EDT   ID Risk Screen Symptoms   Recent Travel History :   No recent travel   Close Contact with COVID-19 ID :   No   Last 14 days COVID-19 ID :   Yes - Not Detected (negative)   TB Symptom Screen :   No symptoms   C. diff Symptom/History ID :   Neither of the above   Patient Pregnant :   None of the above   Freida Busman, RN, Sandria Bales A - 05/31/2019 8:40 EDT   Immunizations   COVID-19 Vaccine Status :   2 Doses received   2 Doses Received Manufacturer :   Pfizer vaccine   Freida Busman, RN, Sandria Bales A - 05/31/2019 8:40 EDT   Social History   Social History   (As Of: 05/31/2019 08:42:53  EDT)     Advance Directive   Advance Directive :   Yes   Type of Advance Directive :   Living will   Darlys Gales A - 05/31/2019 8:40 EDT   Harm Screen   Feels Unsafe at Home :   No    Freida Busman RN, Sandria Bales A - 05/31/2019 8:40 EDT

## 2019-05-31 NOTE — Procedures (Signed)
3 level lumbar Medial Branch Block bilateral        Patient:   Dylan Blankenship, Dylan Blankenship             MRN: [id number]            FIN: [phone]               Age:   59 years     Sex:  Male     DOB:  September 27, 1960   Associated Diagnoses:   None   Author:   Rondall Allegra W-MD      Date: 05/31/2019    DX: Lumbar facet syndrome    Referral: Andrez Grime night    Side: Bilateral    Procedure: 3 level lumbar medial branch block under fluoroscopy    Levels: L4-L5, L5-S1 and sacral ala      Procedure note:    After explaining the risk and benefits of the procedure and informed consent was obtained, patient was taken to the fluoroscopy suite for the procedure.  Timeout was taken to identify the correct patient, procedure and side prior to starting the procedure.  Lying in a prone position, the patient was prepped and draped in the usual sterile fashion and sterility was maintained throughout the procedure.  Each site was identified under fluoroscopy.  A 25-gauge 3-1/2 inch spinal needle was advanced to the anatomic location of each medial branch at the junction of the superior articular process and the transverse process utilizing intermittent fluoroscopy.  10 mg of Depo-Medrol and 0.5 mL of 0.125% bupivacaine was injected at each level slowly.    The procedure was completed without complications and was tolerated well.  The patient was monitored after the procedure.  Patient was given postprocedure and discharge instructions to follow home.  A follow-up appointment was made.    We will consider repeating this procedure or advancing to a radiofrequency ablation in the future depending on the results of this procedure.   Signature Line     Electronically Signed on 05/31/2019 05:36 PM EDT   ________________________________________________   Rondall Allegra W-MD

## 2019-05-31 NOTE — Assessment & Plan Note (Signed)
Pre-Procedure Record - [facility]             Pre-Procedure Record - SFPM Summary                                                             Primary Physician:        Meliton Rattan W-MD    Case Number:              [id number]    Finalized Date/Time:      05/31/19 08:45:46    Pt. Name:                 Dylan Blankenship, Dylan Blankenship    D.O.B./Sex:               [birth year]    Male    Med Rec #:                [id number]    Physician:                Meliton Rattan W-MD    Financial #:              [id number]    Pt. Type:                 O    Room/Bed:                 /    Admit/Disch:              05/31/19 08:00:00 -    Institution:       [facility] - Pre-Procedure - Case Times                                                                                         Entry 1                                                                                                          Patient In Room Time      05/31/19 08:37:00               Nurse In Time                   05/31/19 08:37:00    Nurse Out Time            05/31/19 08:45:00               Patient Ready for  05/31/19 08:45:00                                                              Surgery/Procedure     Last Modified By:         Freida Busman RN, Sandria Bales A                              05/31/19 08:45:45      SFPM - Pre-Procedure - Case Times Audit                                                          05/31/19 08:45:45         Owner: Paulina Fusi                               Modifier: Paulina Fusi                                                        <+> 1         Patient Ready for Surgery/Procedure        <+> 1         Nurse Out Time                Finalized By: Freida Busman RN, Solmon Ice      Document Signatures                                                                             Signed By:           Freida Busman RN, Solmon Ice 05/31/19 08:45

## 2019-06-04 ENCOUNTER — Encounter: Payer: Self-pay | Admitting: Orthopedic Surgery

## 2019-07-02 LAB — PROTIME-INR
INR: 1 — ABNORMAL LOW (ref 1.5–3.5)
Protime: 13.1 seconds (ref 11.6–14.5)

## 2019-07-02 NOTE — Nursing Note (Signed)
Adult Admission Assessment - Text       Perioperative Admission Assessment Entered On:  07/02/2019 12:37 EDT    Performed On:  07/02/2019 12:35 EDT by Ballard Russell, RN, MICHELE L               General   Information Given By :   Self   PAT Patient Procedure Verification :   Patient name and DOB confirmed with patient, Correct procedure scheduled confirmed with patient, Correct side/site confirmed with patient   Languages :   [place]   Preferred Communication Mode :   Verbal, Written   Ballard Russell, RN, MICHELE L - 07/02/2019 12:35 EDT   Allergies   (As Of: 07/02/2019 12:37:27 EDT)   Allergies (Active)   No Known Allergies  Estimated Onset Date:   Unspecified ; Created By:   Durene Cal, RN, DEBORAH L; Reaction Status:   Active ; Category:   Drug ; Substance:   No Known Allergies ; Type:   Allergy ; Updated By:   Reatha Armour; Reviewed Date:   07/02/2019 12:35 EDT        Medication History   Medication List   (As Of: 07/02/2019 12:37:27 EDT)   Normal Order    diazePAM 5 mg Tab  :   diazePAM 5 mg Tab ; Status:   Ordered ; Ordered As Mnemonic:   Valium ; Simple Display Line:   10 mg, 2 tabs, Oral, On Call, PRN: agitation ; Ordering Provider:   Rondall Allegra W-MD; Catalog Code:   diazepam ; Order Dt/Tm:   07/02/2019 12:13:51 EDT            Home Meds    oxybutynin  :   oxybutynin ; Status:   Documented ; Ordered As Mnemonic:   oxybutynin 5 mg/24 hours oral tablet, extended release ; Simple Display Line:   0 Refill(s) ; Catalog Code:   oxybutynin ; Order Dt/Tm:   05/30/2019 10:05:31 EDT          acetaminophen  :   acetaminophen ; Status:   Documented ; Ordered As Mnemonic:   Tylenol ; Simple Display Line:   Oral, 0 Refill(s) ; Catalog Code:   acetaminophen ; Order Dt/Tm:   05/30/2019 10:04:01 EDT          ARIPiprazole  :   ARIPiprazole ; Status:   Documented ; Ordered As Mnemonic:   Abilify 5 mg oral tablet ; Simple Display Line:   mg, tabs, Oral, Daily, 0 Refill(s) ; Catalog Code:   ARIPiprazole ; Order Dt/Tm:   05/30/2019 10:04:19 EDT           FLUoxetine  :   FLUoxetine ; Status:   Documented ; Ordered As Mnemonic:   FLUoxetine 40 mg oral capsule ; Simple Display Line:   0 Refill(s) ; Catalog Code:   FLUoxetine ; Order Dt/Tm:   05/30/2019 10:05:31 EDT          atorvastatin  :   atorvastatin ; Status:   Documented ; Ordered As Mnemonic:   atorvastatin 20 mg oral tablet ; Simple Display Line:   0 Refill(s) ; Catalog Code:   atorvastatin ; Order Dt/Tm:   05/30/2019 10:05:31 EDT          Misc Medication  :   Misc Medication ; Status:   Documented ; Ordered As Mnemonic:   VENLAFAXINE ER 225MG  TABLETS ; Simple Display Line:   0 Refill(s) ; Catalog Code:   Misc Medication ; Order Dt/Tm:  05/30/2019 10:05:31 EDT          traZODone  :   traZODone ; Status:   Documented ; Ordered As Mnemonic:   traZODone 100 mg oral tablet ; Simple Display Line:   0 Refill(s) ; Catalog Code:   traZODone ; Order Dt/Tm:   05/30/2019 10:05:30 EDT          gabapentin  :   gabapentin ; Status:   Documented ; Ordered As Mnemonic:   gabapentin 800 mg oral tablet ; Simple Display Line:   0 Refill(s) ; Catalog Code:   gabapentin ; Order Dt/Tm:   05/30/2019 10:05:30 EDT          warfarin  :   warfarin ; Status:   Documented ; Ordered As Mnemonic:   warfarin 7.5 mg oral tablet ; Simple Display Line:   0 Refill(s) ; Catalog Code:   warfarin ; Order Dt/Tm:   05/30/2019 10:05:30 EDT          warfarin  :   warfarin ; Status:   Documented ; Ordered As Mnemonic:   warfarin 10 mg oral tablet ; Simple Display Line:   0 Refill(s) ; Catalog Code:   warfarin ; Order Dt/Tm:   05/30/2019 10:05:30 EDT            Problem History   (As Of: 07/02/2019 12:37:27 EDT)   Problems(Active)    Ankle pain, left (SNOMED CT  :440102725 )  Name of Problem:   Ankle pain, left ; Recorder:   PYLE, RN, JOYCE; Confirmation:   Confirmed ; Classification:   Patient Stated ; Code:   366440347 ; Contributor System:   PowerChart ; Last Updated:   06/27/2019 10:32 EDT ; Life Cycle Date:   06/27/2019 ; Life Cycle Status:   Active ; Vocabulary:    SNOMED CT        Anxiety (SNOMED CT  :42595638 )  Name of Problem:   Anxiety ; Recorder:   HUNTER, RN, [place] L; Confirmation:   Confirmed ; Classification:   Patient Stated ; Code:   75643329 ; Contributor System:   PowerChart ; Last Updated:   05/30/2019 10:06 EDT ; Life Cycle Date:   05/30/2019 ; Life Cycle Status:   Active ; Vocabulary:   SNOMED CT        Bipolar affective (SNOMED CT  :51884166 )  Name of Problem:   Bipolar affective ; Recorder:   HUNTER, RN, [place] L; Confirmation:   Confirmed ; Classification:   Patient Stated ; Code:   06301601 ; Contributor System:   PowerChart ; Last Updated:   05/30/2019 10:08 EDT ; Life Cycle Date:   05/30/2019 ; Life Cycle Status:   Active ; Vocabulary:   SNOMED CT        Depressive disorder (SNOMED CT  :09323557 )  Name of Problem:   Depressive disorder ; Recorder:   HUNTER, RN, [place] L; Confirmation:   Confirmed ; Classification:   Patient Stated ; Code:   32202542 ; Contributor System:   Conservation officer, nature ; Last Updated:   05/30/2019 10:06 EDT ; Life Cycle Date:   05/30/2019 ; Life Cycle Status:   Active ; Vocabulary:   SNOMED CT        DJD (degenerative joint disease) (SNOMED CT  :7062376283 )  Name of Problem:   DJD (degenerative joint disease) ; Recorder:   PYLE, RN, JOYCE; Confirmation:   Confirmed ; Classification:   Patient Stated ; Code:   1517616073 ; Contributor  System:   PowerChart ; Last Updated:   06/27/2019 10:31 EDT ; Life Cycle Date:   06/27/2019 ; Life Cycle Status:   Active ; Vocabulary:   SNOMED CT        Factor V Leiden (SNOMED CT  :161096045 )  Name of Problem:   Factor V Leiden ; Recorder:   HUNTER, RN, DEBORAH L; Confirmation:   Confirmed ; Classification:   Patient Stated ; Code:   409811914 ; Contributor System:   PowerChart ; Last Updated:   05/30/2019 10:08 EDT ; Life Cycle Date:   05/30/2019 ; Life Cycle Status:   Active ; Vocabulary:   SNOMED CT        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 )  Name of Problem:   Frontal lobe deficit ; Recorder:   PYLE, RN,  JOYCE; Confirmation:   Confirmed ; Classification:   Patient Stated ; Code:   8657846962 ; Contributor System:   PowerChart ; Last Updated:   06/27/2019 10:32 EDT ; Life Cycle Date:   06/27/2019 ; Life Cycle Status:   Active ; Vocabulary:   SNOMED CT        Hyperlipidemia (SNOMED CT  :95284132 )  Name of Problem:   Hyperlipidemia ; Recorder:   PYLE, RN, JOYCE; Confirmation:   Confirmed ; Classification:   Patient Stated ; Code:   44010272 ; Contributor System:   PowerChart ; Last Updated:   06/27/2019 10:31 EDT ; Life Cycle Date:   06/27/2019 ; Life Cycle Status:   Active ; Vocabulary:   SNOMED CT        IBS (irritable bowel syndrome) (SNOMED CT  :53664403 )  Name of Problem:   IBS (irritable bowel syndrome) ; Recorder:   HUNTER, RN, DEBORAH L; Confirmation:   Confirmed ; Classification:   Patient Stated ; Code:   47425956 ; Contributor System:   PowerChart ; Last Updated:   05/30/2019 10:06 EDT ; Life Cycle Date:   05/30/2019 ; Life Cycle Status:   Active ; Vocabulary:   SNOMED CT        Low back pain (SNOMED CT  :387564332 )  Name of Problem:   Low back pain ; Recorder:   HUNTER, RN, DEBORAH L; Confirmation:   Confirmed ; Classification:   Patient Stated ; Code:   951884166 ; Contributor System:   PowerChart ; Last Updated:   05/30/2019 10:05 EDT ; Life Cycle Date:   05/30/2019 ; Life Cycle Status:   Active ; Vocabulary:   SNOMED CT        OSA (obstructive sleep apnea) (SNOMED CT  :063016010 )  Name of Problem:   OSA (obstructive sleep apnea) ; Recorder:   PYLE, RN, JOYCE; Confirmation:   Confirmed ; Classification:   Patient Stated ; Code:   932355732 ; Contributor System:   PowerChart ; Last Updated:   06/27/2019 10:31 EDT ; Life Cycle Date:   06/27/2019 ; Life Cycle Status:   Active ; Vocabulary:   SNOMED CT          Procedure History        -    Procedure History   (As Of: 07/02/2019 12:37:27 EDT)     Procedure Dt/Tm:   05/31/2019 09:33:00 EDT ; Location:   [facility] ; Provider:   Rondall Allegra W-MD; Anesthesia Type:    Local ; Anesthesia Minutes:   0 ; Procedure Name:   Medial Branch Block Lumbar (Bilateral) ; Procedure Minutes:  6 ; Comments:     05/31/2019 9:39 EDT - Gertie Baron, RN, KAY A  auto-populated from documented surgical case ; Clinical Service:   Surgery ; Last Reviewed Dt/Tm:   07/02/2019 12:37:06 EDT            Anesthesia Minutes:   0 ; 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 ; Procedure Minutes:   0 ; Last Reviewed Dt/Tm:   07/02/2019 12:37:06 EDT            Procedure Dt/Tm:   12/26/2011 ; Anesthesia Minutes:   0 ; Procedure Name:   Colonoscopy ; Procedure Minutes:   0 ; Last Reviewed Dt/Tm:   07/02/2019 12:37:06 EDT            Anesthesia Minutes:   0 ; Procedure Name:   Vasectomy ; Procedure Minutes:   0 ; Last Reviewed Dt/Tm:   07/02/2019 12:37:06 EDT            Anesthesia Minutes:   0 ; Procedure Name:   Bilateral Arthroscopy of Knee ; Procedure Minutes:   0 ; Last Reviewed Dt/Tm:   07/02/2019 12:37:06 EDT            Anesthesia Minutes:   0 ; Procedure Name:   Left Hip Replacement x 2 ; Procedure Minutes:   0 ; Last Reviewed Dt/Tm:   07/02/2019 12:37:06 EDT            Anesthesia Minutes:   0 ; Procedure Name:   Left Ankle Fusion ; Procedure Minutes:   0 ; Comments:     05/30/2019 10:14 EDT - Durene Cal, RN, DEBORAH L  03/2017 ; Last Reviewed Dt/Tm:   07/02/2019 12:37:06 EDT            History Confirmation   Problem History Changes PAT :   No   Procedure History Changes PAT :   No   Ballard Russell, RN, MICHELE L - 07/02/2019 12:35 EDT   Bloodless Medicine   Is Blood Transfusion Acceptable to Patient :   Yes   Ballard Russell, RN, MICHELE L - 07/02/2019 12:35 EDT   ID Risk Screen Symptoms   Recent Travel History :   No recent travel   Last 14 days COVID-19 ID :   No   TB Symptom Screen :   No symptoms   C. diff Symptom/History ID :   Neither of the above   [place], RN, MICHELE L - 07/02/2019 12:35 EDT   Immunizations   COVID-19 Vaccine Status :   2 Doses received   2 Doses Received Manufacturer :   Pfizer vaccine   [place] [address], RN, MICHELE L -  07/02/2019 12:35 EDT   Advance Directive   Advance Directive :   Yes   Type of Advance Directive :   Living will   Ballard Russell RN, MICHELE L - 07/02/2019 12:35 EDT   Harm Screen   Injuries/Abuse/Neglect in Household :   Denies   Feels Unsafe at Home :   No   Ballard Russell, RN, MICHELE L - 07/02/2019 12:35 EDT

## 2019-07-02 NOTE — Procedures (Signed)
Procedure Record - SFPM             Procedure Record - SFPM Summary                                                                 Primary Physician:        Rondall Allegra W-MD    Case Number:              [id number]    Finalized Date/Time:      07/02/19 13:20:00    Pt. Name:                 Dylan Blankenship, Dylan Blankenship    D.O.B./Sex:               [birth year]    Male    Med Rec #:                [id number]    Physician:                Rondall Allegra W-MD    Financial #:              [id number]    Pt. Type:                 O    Room/Bed:                 /    Admit/Disch:              07/02/19 11:59:00 -    Institution:       [facility] - Case Attendance                                                                                                    Entry 1                         Entry 2                         Entry 3                                          Case Attendee             RANDALL,  DERRICK W-MD          Freida Busman, RN, Desma Paganini,  JOHN A    Role Performed            Surgeon Primary                 Circulator  Radiology Tech    Time In                   07/02/19 13:03:00    Time Out     Procedure                 Medial Branch Block             Medial Branch Block             Medial Branch Block                              Lumbar(Bilateral)               Lumbar(Bilateral)               Lumbar(Bilateral)    Last Modified ByFreida Busman, RN, Gwenyth Allegra, RN, Gwenyth Allegra, RN, [facility] A                              07/02/19 13:04:00               07/02/19 13:04:00               07/02/19 13:04:00      SFPM - Case Attendance Audit                                                                     07/02/19 13:04:00         Owner: ALLEKH                               Modifier: ALLEKH                                                            1     <+> Time In            1     <*> Procedure                              Medial Branch Block Lumbar(Bilateral)         <+> 2         Case Attendee        <+> 2         Role Performed        <+> 2         Procedure        <+> 3         Case Attendee        <+> 3         Role Performed        <+>  3         Procedure        SFPM - Case Times                                                                                                         Entry 1                                                                                                          Patient      In Room Time             07/02/19 13:03:00               Out Room Time                   07/02/19 13:19:00    Anesthesia     Procedure      Start Time               07/02/19 13:04:00               Stop Time                       07/02/19 13:15:00    Last Modified By:         Darlys Gales A                              07/02/19 13:19:58      SFPM - Case Times Audit                                                                          07/02/19 13:19:58         Owner: ALLEKH                               Modifier: ALLEKH                                                        <+> 1  Out Room Time     07/02/19 13:15:43         Owner: [facility]                               ModifierPaulina Fusi                                                        <+> 1         Start Time     07/02/19 13:15:34         Owner: ALLEKH                               Modifier: ALLEKH                                                        <+> 1         Stop Time        SFPM - General Case Data                                                                                                  Entry 1                                                                                                          Case Information      ASA Class                N/A                             Case Level                      None     OR                       [facility] PM 01                        Specialty  Pain Management (SN)     Wound Class              1-Clean    Preop Diagnosis            M47.816                         Postop Diagnosis                M47.816    Last Modified By:         Freida Busman, RN, Sandria Bales A                              07/02/19 13:03:41      SFPM - Procedures                                                                                                         Entry 1                                                                                                          Procedure     Description      Procedure                Medial Branch Block             Modifiers                       Bilateral                              Lumbar     Surgical Procedure       LUMBAR MBB L4-L5; L5-S1     Text     Primary Procedure         Yes                             Primary Surgeon                 Rondall Allegra W-MD    Start                     07/02/19 13:04:00               Stop  07/02/19 13:15:00    Anesthesia Type           Local                           Surgical Service                Pain Management (SN)    Wound Class               1-Clean    Last Modified By:         Zenia Resides RN, Rubbie Battiest A                              07/02/19 13:15:44      SFPM - Dressing/Packing                                                                                                   Entry 1                                                                                                          Site                      Back    Dressing Item     Details      Dressing Item            Band-Aid     (Im.290)     Last Modified By:         Zenia Resides, RN, Rubbie Battiest A                              07/02/19 13:03:47      SFPM - Procedure Setup                                                                                                    Entry 1  Body Position             Prone                           Prep Agents (Im.270)            Chlorhexidine Gluconate                                                                                               2% w/Alcohol    Skin Prep Agent Dry       Yes                             Equipment Used                  O2 Sat    Without Pooling     Vital signs               Yes    completed and     patient reassessed     prior to sedation     Last Modified By:         Freida BusmanAllen, RN, Sandria BalesKhali A                              07/02/19 13:03:35      SFPM - Time Out - Procedure                                                                                               Entry 1                                                                                                          Procedure                 Medial Branch Block             Patient name and                Yes  Lumbar(Bilateral)               DOB confirmed     Surgical procedure        Yes                             Correct surgical                Yes    to be performed                                           site marked and     confirmed and                                             initials are     verified by                                               visible through     completed surgical                                        prepped and draped     consent                                                   field (or                                                               alternative ID band                                                               used), if applicable     Allergies discussed       Yes                             Anticoagulation                 Yes                                                              status confirmed     Time Out Complete  07/02/19 13:03:00    Last Modified By:         Freida Busman, RN, Sandria Bales A                              07/02/19 13:03:28      SFPM - Debrief - Procedure                                                                                                Entry 1                                                                                                           Procedure                 Medial Branch Block             Actual procedure                Yes                              Lumbar(Bilateral)               performed confirmed     Confirm specimens         Yes                             Patient recovery                Yes    and specimens                                             plan confirmed     labeled     appropriately (if     applicable)     Debrief Complete          07/02/19 13:15:00    Last Modified By:         Freida Busman, RN, Sandria Bales A                              07/02/19 13:15:51      Case Comments                                                                                         <  None>              Finalized By: Freida Busman, RN, Solmon Ice      Document Signatures                                                                             Signed By:           Freida Busman RN, Sandria Bales A 07/02/19 13:20

## 2019-07-02 NOTE — Procedures (Signed)
2 level lumbar Medial Branch Block        Patient:   Dylan Blankenship, Dylan Blankenship             MRN: [id number]            FIN: [phone]               Age:   59 years     Sex:  Male     DOB:  [birth year]   Associated Diagnoses:   None   Author:   Rondall Allegra W-MD      Date: 07/02/2019    DX: Lumbar facet syndrome    Referral: Daine Gravel    Side: Bilateral    Procedure: Lumbar medial branch block under fluoroscopy    Levels: L4-L5 L5-S1      Procedure note:    After explaining the risk and benefits of the procedure and informed consent was obtained, patient was taken to the fluoroscopy suite for the procedure.  Timeout was taken to identify the correct patient, procedure and side prior to starting the procedure.  Lying in a prone position, the patient was prepped and draped in the usual sterile fashion and sterility was maintained throughout the procedure.  Each site was identified under fluoroscopy.  A 25-gauge 3-1/2 inch spinal needle was advanced to the anatomic location of each medial branch at the junction of the superior articular process and the transverse process utilizing intermittent fluoroscopy.  10 mg of Depo-Medrol and 0.5 mL of 0.125% bupivacaine was injected at each level slowly.    The procedure was completed without complications and was tolerated well.  The patient was monitored after the procedure.  Patient was given postprocedure and discharge instructions to follow home.  A follow-up appointment was made.    We will consider repeating this procedure or advancing to a radiofrequency ablation in the future depending on the results of this procedure.   Signature Line     Electronically Signed on 07/02/2019 01:21 PM EDT   ________________________________________________   Rondall Allegra W-MD

## 2019-07-02 NOTE — Nursing Note (Signed)
Nursing Discharge Summary - Text       Physician Discharge Summary Entered On:  07/02/2019 12:48 EDT    Performed On:  07/02/2019 12:47 EDT by Pricilla Riffle, RN, JOYCE               DC Information   Provider Instructions for Activity :   May shower, No Baths/Hot Tubs/Oceans/ or Pools, No bending, twisting or lifting, No driving   Provider Instructions for Wound Care :   Keep your dressing clean and dry   PYLE, RN, JOYCE - 07/02/2019 12:47 EDT

## 2019-07-02 NOTE — Op Note (Signed)
Phase II Record - SFPM             Phase II Record - SFPM Summary                                                                  Primary Physician:        Rondall Allegra W-MD    Case Number:              [id number]    Finalized Date/Time:      07/02/19 13:29:50    Pt. Name:                 Dylan Blankenship, Dylan Blankenship    D.O.B./Sex:               04/20/1960    Male    Med Rec #:                [id number]    Physician:                Rondall Allegra W-MD    Financial #:              [id number]    Pt. Type:                 O    Room/Bed:                 /    Admit/Disch:              07/02/19 11:59:00 -    Institution:       SFPM Case Time Phase II                                                                                                   Entry 1                                                                                                          Phase II In               07/02/19 13:19:00               Phase II Out                    07/02/19 13:29:00    Phase II Discharge        07/02/19 13:29:00  Time     Last Modified ByTerance Hart, RN, JOYCE                              07/02/19 13:29:49    General Comments:            pain journal given and instructions and documentation      SFPM Case Time Phase II Audit                                                                    07/02/19 13:29:49         Owner: Donney Rankins                                Modifier: [email or web address]                                                             1     <*> Phase II Out            1     <*> Phase II Discharge Time                Finalized ByTerance Hart RN, JOYCE      Document Signatures                                                                             Signed By:           Terance Hart RN, JOYCE 07/02/19 13:29

## 2019-07-02 NOTE — Assessment & Plan Note (Signed)
Pre-Procedure Record - [facility]             Pre-Procedure Record - SFPM Summary                                                             Primary Physician:        Meliton Rattan W-MD    Case Number:              [id number]    Finalized Date/Time:      07/02/19 12:51:13    Pt. Name:                 Dylan Blankenship, Dylan Blankenship    D.O.B./Sex:               1960-05-28    Male    Med Rec #:                [id number]    Physician:                Meliton Rattan W-MD    Financial #:              [id number]    Pt. Type:                 O    Room/Bed:                 /    Admit/Disch:              07/02/19 11:59:00 -    Institution:       TI[facility] - Pre-Procedure - Case Times                                                                                         Entry 1                                                                                                          Patient In Room Time      07/02/19 12:32:00               Nurse In Time                   07/02/19 12:32:00    Nurse Out Time            07/02/19 12:41:00               Patient Ready for  07/02/19 12:41:00                                                              Surgery/Procedure     Last Modified By:         Ballard Russell RN, MICHELE L                              07/02/19 12:51:11              Finalized By: Ballard Russell RN, MICHELE L      Document Signatures                                                                             Signed By:           Ballard Russell RN, MICHELE L 07/02/19 12:51

## 2019-07-02 NOTE — Nursing Note (Signed)
Nursing Discharge Summary - Text       Nursing Discharge Summary Entered On:  07/02/2019 12:47 EDT    Performed On:  07/02/2019 12:47 EDT by Pricilla Riffle, RN, JOYCE               DC Information   Discharge To :   Home independently   Mode of Discharge :   Ambulatory   PYLE, RN, Alona Bene - 07/02/2019 12:47 EDT   Education   Responsible Learner(s) :   No Data Available     Barriers To Learning :   None evident   Teaching Method :   Explanation, Printed materials   [employer], RN, JOYCE - 07/02/2019 12:47 EDT   Post-Hospital Education Adult Grid   Importance of Follow-Up Visits :   Verbalizes understanding   Pain Management :   Verbalizes understanding   Plan of Care :   Verbalizes understanding   Postoperative Instructions :   Verbalizes understanding   When to Call Health Care Provider :   Verbalizes understanding   Pricilla Riffle, RN, JOYCE - 07/02/2019 12:47 EDT

## 2019-07-02 NOTE — H&P (Signed)
History & Physical  Pre-Procedure        Patient:   Dylan Blankenship, Dylan Blankenship             MRN: [id number]            FIN: [phone]               Age:   59 years     Sex:  Male     DOB:  12-Sep-1960   Associated Diagnoses:   None   Author:   Rondall Allegra W-MD      Date: 07/02/2019    DX: Lumbar facet syndrome    PROCEDURE: Lumbar medial branch block bilaterally 2 levels      Patient is here for a therapeutic pain procedure with fluoroscopy.    Vitals: See nursing notes    Mental status: Alert and oriented x3    Cardiovascular: Regular rate and rhythm    Lungs: Clear to auscultation bilaterally    Neuro: Nonfocal    Assessment:  Patient here for therapeutic pain procedure.     Plan:  Will plan to proceed with scheduled pain procedure        Signature Line     Electronically Signed on 07/02/2019 01:22 PM EDT   ________________________________________________   Rondall Allegra W-MD

## 2019-07-16 LAB — PROTIME-INR
INR: 0.8 — ABNORMAL LOW (ref 1.5–3.5)
Protime: 11.8 seconds — AB (ref 11.6–14.5)

## 2019-07-16 NOTE — H&P (Signed)
History & Physical  Pre-Procedure        Patient:   Dylan Blankenship, Dylan Blankenship             MRN: [id number]            FIN: [phone]               Age:   59 years     Sex:  Male     DOB:  Nov 20, 1960   Associated Diagnoses:   None   Author:   Rondall Allegra W-MD      Date: 07/16/2019    DX: Lumbar facet syndrome    PROCEDURE: 3 level lumbar medial branch radiofrequency ablation bilaterally with sedation      Patient is here for a therapeutic pain procedure with fluoroscopy.    Vitals: See nursing notes    Mental status: Alert and oriented x3    Cardiovascular: Regular rate and rhythm    Lungs: Clear to auscultation bilaterally    Neuro: Nonfocal    Assessment:  Patient here for therapeutic pain procedure.     Plan:  Will plan to proceed with scheduled pain procedure        Signature Line     Electronically Signed on 07/16/2019 12:35 PM EDT   ________________________________________________   Rondall Allegra W-MD

## 2019-07-16 NOTE — Nursing Note (Signed)
Nursing Discharge Summary - Text       Nursing Discharge Summary Entered On:  07/16/2019 11:27 EDT    Performed On:  07/16/2019 11:27 EDT by Pricilla Riffle, RN, JOYCE               DC Information   Discharge To :   Home independently   Mode of Discharge :   Wheelchair   [place], RN, Alona Bene - 07/16/2019 11:27 EDT   Education   Responsible Learner(s) :   No Data Available     Barriers To Learning :   None evident   Teaching Method :   Explanation, Printed materials   [place], RN, JOYCE - 07/16/2019 11:27 EDT   Post-Hospital Education Adult Grid   Importance of Follow-Up Visits :   Verbalizes understanding   Pain Management :   Verbalizes understanding   Plan of Care :   Verbalizes understanding   Postoperative Instructions :   Verbalizes understanding   PYLE, RN, JOYCE - 07/16/2019 11:27 EDT

## 2019-07-16 NOTE — Assessment & Plan Note (Signed)
Pre-Procedure Record - SFPM             Pre-Procedure Record - SFPM Summary                                                             Primary Physician:        Rondall Allegra W-MD    Case Number:              [id number]    Finalized Date/Time:      07/16/19 11:21:49    Pt. Name:                 Dylan Blankenship, Dylan Blankenship    D.O.B./Sex:               August 27, 1960    Male    Med Rec #:                [id number]    Physician:                Rondall Allegra W-MD    Financial #:              [id number]    Pt. Type:                 O    Room/Bed:                 /    Admit/Disch:              07/16/19 10:48:00 -    Institution:       [facility] - Pre-Procedure - Case Times                                                                                         Entry 1                                                                                                          Patient In Room Time      07/16/19 11:01:00               Nurse In Time                   07/16/19 11:01:00    Nurse Out Time            07/16/19 11:16:00               Patient Ready for  07/16/19 11:16:00                                                              Surgery/Procedure     Last Modified By:         Ballard Russell RN, MICHELE L                              07/16/19 11:21:45              Finalized By: Ballard Russell RN, MICHELE L      Document Signatures                                                                             Signed By:           Ballard Russell RN, MICHELE L 07/16/19 11:21

## 2019-07-16 NOTE — Op Note (Signed)
Phase II Record - SFPM             Phase II Record - SFPM Summary                                                                  Primary Physician:        Meliton Rattan W-MD    Case Number:              [id number]    Finalized Date/Time:      07/16/19 12:18:13    Pt. Name:                 Dylan Blankenship, Dylan Blankenship    D.O.B./Sex:               [birth year]    Male    Med Rec #:                [id number]    Physician:                Meliton Rattan W-MD    Financial #:              [id number]    Pt. Type:                 O    Room/Bed:                 /    Admit/Disch:              07/16/19 10:48:00 -    Institution:       SFPM Case Time Phase II                                                                                                   Entry 1                                                                                                          Phase II In               07/16/19 12:08:00               Phase II Out                    07/16/19 12:18:00    Phase II Discharge        07/16/19 12:18:00  Time     Last Modified ByPricilla Riffle, RN, JOYCE                              07/16/19 12:18:12      SFPM Case Time Phase II Audit                                                                    07/16/19 12:18:12         Owner: Majel Homer                                Modifier: [email or web address]                                                         <+> 1         Phase II Out        <+> 1         Phase II Discharge Time                Finalized By: Pricilla Riffle RN, JOYCE      Document Signatures                                                                             Signed By:           Pricilla Riffle RN, JOYCE 07/16/19 12:18

## 2019-07-16 NOTE — Procedures (Signed)
Radiofrequency ablation lumbar medial branch nerve 3 levels bilateral        Patient:   Dylan Blankenship, Dylan Blankenship             MRN: [id number]            FIN: [phone]               Age:   59 years     Sex:  Male     DOB:  11-Aug-1960   Associated Diagnoses:   None   Author:   Rondall Allegra W-MD      Date: 07/16/2019    DX: Low back pain, lumbar facet syndrome    Referral: Daine Gravel    SIDE: Bilateral    Procedure: Radiofrequency ablation lumbar medial nerves    LEVELS: L4-L5, L5-S1 and sacral ala    Sedation: Versed 2 mg and fentanyl 100 mcg         After explaining the procedure to the patient along with the risk and benefits, patient wished to proceed with the procedure.  Informed consent was obtained and patient was taken to the fluoroscopy suite and placed in a prone position.  The patient was prepped and draped in the usual sterile fashion and sterility was maintained throughout the procedure.  Levels were determined under fluoroscopy.  Full anesthetic was given by raising the skin well and going down to the hub of a 27-gauge 1/2 inch needle.  The radiofrequency needle was introduced to the anatomic location of the medial branch at the junction of the superior articular process and transverse process of level utilizing intermittent fluoroscopy.  Sensory stimulation was used at each level to a response of tingling pressure or pain below 1.0 V.  Afterwards each level was tested for motor stimulation up to 3 V.  No motor stimulation was seen or felt by the patient nor the staff.  1 mL of 0.25% bupivacaine and milligrams of Depo-Medrol was injected at each site.  After waiting 90 seconds, the ablation was started.  The ablation was at a temperature of 80 ??C for timeframe of 90 seconds.    The procedure was completed without complications and was tolerated by the patient very well.  The patient was monitored after the procedure in the recovery room.  The patient was given post procedure and discharge instructions to follow  at home.  Patient was discharged in stable condition and a follow-up appointment was made.   Signature Line     Electronically Signed on 07/16/2019 12:36 PM EDT   ________________________________________________   Rondall Allegra W-MD

## 2019-07-16 NOTE — Nursing Note (Signed)
Adult Admission Assessment - Text       Perioperative Admission Assessment Entered On:  07/16/2019 11:04 EDT    Performed On:  07/16/2019 11:04 EDT by Joaquin Bend, RN, MICHELE L               General   Information Given By :   Self   PAT Patient Procedure Verification :   Patient name and DOB confirmed with patient, Correct procedure scheduled confirmed with patient, Correct side/site confirmed with patient   Languages :   [place]   Preferred Communication Mode :   Verbal, Written   Joaquin Bend, RN, MICHELE L - 07/16/2019 11:04 EDT   Allergies   (As Of: 07/16/2019 11:04:48 EDT)   Allergies (Active)   No Known Allergies  Estimated Onset Date:   Unspecified ; Created By:   Yong Channel, RN, [place] L; Reaction Status:   Active ; Category:   Drug ; Substance:   No Known Allergies ; Type:   Allergy ; Updated By:   Lucia Estelle; Reviewed Date:   07/16/2019 11:04 EDT        Medication History   Medication List   (As Of: 07/16/2019 11:04:48 EDT)   Home Meds    oxybutynin  :   oxybutynin ; Status:   Documented ; Ordered As Mnemonic:   oxybutynin 5 mg/24 hours oral tablet, extended release ; Simple Display Line:   0 Refill(s) ; Catalog Code:   oxybutynin ; Order Dt/Tm:   05/30/2019 10:05:31 EDT          acetaminophen  :   acetaminophen ; Status:   Documented ; Ordered As Mnemonic:   Tylenol ; Simple Display Line:   Oral, 0 Refill(s) ; Catalog Code:   acetaminophen ; Order Dt/Tm:   05/30/2019 10:04:01 EDT          ARIPiprazole  :   ARIPiprazole ; Status:   Documented ; Ordered As Mnemonic:   Abilify 5 mg oral tablet ; Simple Display Line:   mg, tabs, Oral, Daily, 0 Refill(s) ; Catalog Code:   ARIPiprazole ; Order Dt/Tm:   05/30/2019 10:04:19 EDT          FLUoxetine  :   FLUoxetine ; Status:   Documented ; Ordered As Mnemonic:   FLUoxetine 40 mg oral capsule ; Simple Display Line:   0 Refill(s) ; Catalog Code:   FLUoxetine ; Order Dt/Tm:   05/30/2019 10:05:31 EDT          atorvastatin  :   atorvastatin ; Status:   Documented ; Ordered As  Mnemonic:   atorvastatin 20 mg oral tablet ; Simple Display Line:   0 Refill(s) ; Catalog Code:   atorvastatin ; Order Dt/Tm:   05/30/2019 10:05:31 EDT          Misc Medication  :   Misc Medication ; Status:   Documented ; Ordered As Mnemonic:   VENLAFAXINE ER 225MG  TABLETS ; Simple Display Line:   0 Refill(s) ; Catalog Code:   Misc Medication ; Order Dt/Tm:   05/30/2019 10:05:31 EDT          traZODone  :   traZODone ; Status:   Documented ; Ordered As Mnemonic:   traZODone 100 mg oral tablet ; Simple Display Line:   0 Refill(s) ; Catalog Code:   traZODone ; Order Dt/Tm:   05/30/2019 10:05:30 EDT          gabapentin  :   gabapentin ; Status:  Documented ; Ordered As Mnemonic:   gabapentin 800 mg oral tablet ; Simple Display Line:   0 Refill(s) ; Catalog Code:   gabapentin ; Order Dt/Tm:   05/30/2019 10:05:30 EDT          warfarin  :   warfarin ; Status:   Documented ; Ordered As Mnemonic:   warfarin 7.5 mg oral tablet ; Simple Display Line:   0 Refill(s) ; Catalog Code:   warfarin ; Order Dt/Tm:   05/30/2019 10:05:30 EDT          warfarin  :   warfarin ; Status:   Documented ; Ordered As Mnemonic:   warfarin 10 mg oral tablet ; Simple Display Line:   0 Refill(s) ; Catalog Code:   warfarin ; Order Dt/Tm:   05/30/2019 10:05:30 EDT            Problem History   (As Of: 07/16/2019 11:04:48 EDT)   Problems(Active)    Ankle pain, left (SNOMED CT  :480165537 )  Name of Problem:   Ankle pain, left ; Recorder:   PYLE, RN, JOYCE; Confirmation:   Confirmed ; Classification:   Patient Stated ; Code:   482707867 ; Contributor System:   PowerChart ; Last Updated:   06/27/2019 10:32 EDT ; Life Cycle Date:   06/27/2019 ; Life Cycle Status:   Active ; Vocabulary:   SNOMED CT        Anxiety (SNOMED CT  :54492010 )  Name of Problem:   Anxiety ; Recorder:   HUNTER, RN, DEBORAH L; Confirmation:   Confirmed ; Classification:   Patient Stated ; Code:   07121975 ; Contributor System:   PowerChart ; Last Updated:   05/30/2019 10:06 EDT ; Life Cycle Date:    05/30/2019 ; Life Cycle Status:   Active ; Vocabulary:   SNOMED CT        Bipolar affective (SNOMED CT  :88325498 )  Name of Problem:   Bipolar affective ; Recorder:   HUNTER, RN, DEBORAH L; Confirmation:   Confirmed ; Classification:   Patient Stated ; Code:   26415830 ; Contributor System:   PowerChart ; Last Updated:   05/30/2019 10:08 EDT ; Life Cycle Date:   05/30/2019 ; Life Cycle Status:   Active ; Vocabulary:   SNOMED CT        Depressive disorder (SNOMED CT  :94076808 )  Name of Problem:   Depressive disorder ; Recorder:   HUNTER, RN, DEBORAH L; Confirmation:   Confirmed ; Classification:   Patient Stated ; Code:   81103159 ; Contributor System:   Dietitian ; Last Updated:   05/30/2019 10:06 EDT ; Life Cycle Date:   05/30/2019 ; Life Cycle Status:   Active ; Vocabulary:   SNOMED CT        DJD (degenerative joint disease) (SNOMED CT  :4585929244 )  Name of Problem:   DJD (degenerative joint disease) ; Recorder:   PYLE, RN, JOYCE; Confirmation:   Confirmed ; Classification:   Patient Stated ; Code:   6286381771 ; Contributor System:   Dietitian ; Last Updated:   06/27/2019 10:31 EDT ; Life Cycle Date:   06/27/2019 ; Life Cycle Status:   Active ; Vocabulary:   SNOMED CT        Factor V Leiden (SNOMED CT  :165790383 )  Name of Problem:   Factor V Leiden ; Recorder:   HUNTER, RN, DEBORAH L; Confirmation:   Confirmed ; Classification:   Patient Stated ;  Code:   433295188 ; Contributor System:   PowerChart ; Last Updated:   05/30/2019 10:08 EDT ; Life Cycle Date:   05/30/2019 ; Life Cycle Status:   Active ; Vocabulary:   SNOMED CT        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 )  Name of Problem:   Frontal lobe deficit ; Recorder:   PYLE, RN, JOYCE; Confirmation:   Confirmed ; Classification:   Patient Stated ; Code:   0109323557 ; Contributor System:   PowerChart ; Last Updated:   06/27/2019 10:32 EDT ; Life Cycle Date:   06/27/2019 ; Life Cycle Status:   Active ; Vocabulary:   SNOMED CT        Hyperlipidemia (SNOMED  CT  :32202542 )  Name of Problem:   Hyperlipidemia ; Recorder:   PYLE, RN, JOYCE; Confirmation:   Confirmed ; Classification:   Patient Stated ; Code:   70623762 ; Contributor System:   PowerChart ; Last Updated:   06/27/2019 10:31 EDT ; Life Cycle Date:   06/27/2019 ; Life Cycle Status:   Active ; Vocabulary:   SNOMED CT        IBS (irritable bowel syndrome) (SNOMED CT  :83151761 )  Name of Problem:   IBS (irritable bowel syndrome) ; Recorder:   HUNTER, RN, DEBORAH L; Confirmation:   Confirmed ; Classification:   Patient Stated ; Code:   60737106 ; Contributor System:   PowerChart ; Last Updated:   05/30/2019 10:06 EDT ; Life Cycle Date:   05/30/2019 ; Life Cycle Status:   Active ; Vocabulary:   SNOMED CT        Low back pain (SNOMED CT  :269485462 )  Name of Problem:   Low back pain ; Recorder:   HUNTER, RN, DEBORAH L; Confirmation:   Confirmed ; Classification:   Patient Stated ; Code:   703500938 ; Contributor System:   PowerChart ; Last Updated:   05/30/2019 10:05 EDT ; Life Cycle Date:   05/30/2019 ; Life Cycle Status:   Active ; Vocabulary:   SNOMED CT        OSA (obstructive sleep apnea) (SNOMED CT  :182993716 )  Name of Problem:   OSA (obstructive sleep apnea) ; Recorder:   PYLE, RN, JOYCE; Confirmation:   Confirmed ; Classification:   Patient Stated ; Code:   967893810 ; Contributor System:   PowerChart ; Last Updated:   06/27/2019 10:31 EDT ; Life Cycle Date:   06/27/2019 ; Life Cycle Status:   Active ; Vocabulary:   SNOMED CT          Procedure History        -    Procedure History   (As Of: 07/16/2019 11:04:48 EDT)     Procedure Dt/Tm:   05/31/2019 09:33:00 EDT ; Location:   [facility] ; Provider:   Rondall Allegra W-MD; Anesthesia Type:   Local ; Anesthesia Minutes:   0 ; Procedure Name:   Medial Branch Block Lumbar (Bilateral) ; Procedure Minutes:   6 ; Comments:     05/31/2019 9:39 EDT - Gertie Baron, RN, KAY A  auto-populated from documented surgical case ; Clinical Service:   Surgery ; Last Reviewed Dt/Tm:    07/16/2019 11:04:26 EDT            Anesthesia Minutes:   0 ; 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 ; Procedure Minutes:   0 ; Last Reviewed Dt/Tm:   07/16/2019  11:04:26 EDT            Procedure Dt/Tm:   12/26/2011 ; Anesthesia Minutes:   0 ; Procedure Name:   Colonoscopy ; Procedure Minutes:   0 ; Last Reviewed Dt/Tm:   07/16/2019 11:04:26 EDT            Anesthesia Minutes:   0 ; Procedure Name:   Vasectomy ; Procedure Minutes:   0 ; Last Reviewed Dt/Tm:   07/16/2019 11:04:26 EDT            Anesthesia Minutes:   0 ; Procedure Name:   Bilateral Arthroscopy of Knee ; Procedure Minutes:   0 ; Last Reviewed Dt/Tm:   07/16/2019 11:04:26 EDT            Anesthesia Minutes:   0 ; Procedure Name:   Left Hip Replacement x 2 ; Procedure Minutes:   0 ; Last Reviewed Dt/Tm:   07/16/2019 11:04:26 EDT            Anesthesia Minutes:   0 ; Procedure Name:   Left Ankle Fusion ; Procedure Minutes:   0 ; Comments:     05/30/2019 10:14 EDT - Durene Cal, RN, DEBORAH L  03/2017 ; Last Reviewed Dt/Tm:   07/16/2019 11:04:26 EDT            Procedure Dt/Tm:   07/02/2019 13:04:00 EDT ; Location:   [facility] ; Provider:   Rondall Allegra W-MD; Anesthesia Type:   Local ; Anesthesia Minutes:   0 ; Procedure Name:   Medial Branch Block Lumbar (Bilateral) ; Procedure Minutes:   11 ; Comments:     07/02/2019 13:20 EDT - Freida Busman, RN, Sandria Bales A  auto-populated from documented surgical case ; Clinical Service:   Surgery ; Last Reviewed Dt/Tm:   07/16/2019 11:04:26 EDT            History Confirmation   Problem History Changes PAT :   No   Procedure History Changes PAT :   No   Ballard Russell RN, MICHELE L - 07/16/2019 11:04 EDT   Bloodless Medicine   Is Blood Transfusion Acceptable to Patient :   Yes   Ballard Russell, RN, MICHELE L - 07/16/2019 11:04 EDT   ID Risk Screen Symptoms   Recent Travel History :   No recent travel   Close Contact with COVID-19 ID :   No   Last 14 days COVID-19 ID :   No   TB Symptom Screen :   No symptoms   C. diff Symptom/History  ID :   Neither of the above   [place], RN, MICHELE L - 07/16/2019 11:04 EDT   Immunizations   COVID-19 Vaccine Status :   2 Doses received   2 Doses Received Manufacturer :   Pfizer vaccine   [place], RN, MICHELE L - 07/16/2019 11:04 EDT   Advance Directive   Advance Directive :   Yes   Type of Advance Directive :   Living will   Location of Advance Directive :   Unable to obtain copy   Ballard Russell RN, MICHELE L - 07/16/2019 11:04 EDT   Harm Screen   Injuries/Abuse/Neglect in Household :   Denies   Feels Safe Where Live :   Yes   Ballard Russell, RN, MICHELE L - 07/16/2019 11:04 EDT

## 2019-07-16 NOTE — Nursing Note (Signed)
Nursing Discharge Summary - Text       Physician Discharge Summary Entered On:  07/16/2019 11:27 EDT    Performed On:  07/16/2019 11:27 EDT by Pricilla Riffle, RN, JOYCE               DC Information   Provider Instructions for Activity :   May shower, No Baths/Hot Tubs/Oceans/ or Pools, No bending, twisting or lifting, No driving   Provider Instructions for Wound Care :   Keep your dressing clean and dry   PYLE, RN, JOYCE - 07/16/2019 11:27 EDT

## 2019-07-16 NOTE — Procedures (Signed)
Procedure Record - SFPM             Procedure Record - SFPM Summary                                                                 Primary Physician:        Rondall AllegraANDALL,  DERRICK W-MD    Case Number:              [id number]    Finalized Date/Time:      07/16/19 12:08:58    Pt. Name:                 Dylan Blankenship, Dylan Blankenship    D.O.B./Sex:               1961-01-03    Male    Med Rec #:                [id number]    Physician:                Rondall AllegraANDALL,  DERRICK W-MD    Financial #:              [id number]    Pt. Type:                 O    Room/Bed:                 /    Admit/Disch:              07/16/19 10:48:00 -    Institution:       [id number] - Case Attendance                                                                                                    Entry 1                         Entry 2                         Entry 3                                          Case Attendee             RANDALL,  DERRICK W-MD          Freida BusmanAllen, RN, Desma PaganiniKhali A              CHILDS,  JOHN A    Role Performed            Surgeon Primary                 Circulator  Radiology Tech    Time In                   07/16/19 11:36:00    Time Out     Procedure                 Nerve Neurolysis                Nerve Neurolysis                Nerve Neurolysis                              Coolief(Left)                   Coolief(Left)                   Coolief(Left)    Last Modified By:         Freida Busman, RN, Gwenyth Allegra, RN, Gwenyth Allegra, RN, [facility] A                              07/16/19 11:46:33               07/16/19 11:46:33               07/16/19 11:46:33      SFPM - Case Attendance Audit                                                                     07/16/19 11:46:33         Owner: ALLEKH                               Modifier: ALLEKH                                                            1     <+> Time In            1     <*> Procedure                              Nerve Neurolysis Coolief(Left)        <+> 2          Case Attendee        <+> 2         Role Performed        <+> 2         Procedure        <+> 3         Case Attendee        <+> 3  Role Performed        <+> 3         Procedure        SFPM - Case Times                                                                                                         Entry 1                                                                                                          Patient      In Room Time             07/16/19 11:36:00               Out Room Time                   07/16/19 12:08:00    Anesthesia     Procedure      Start Time               07/16/19 11:38:00               Stop Time                       07/16/19 12:05:00    Last Modified By:         Freida Busman RN, Sandria Bales A                              07/16/19 12:08:56      SFPM - Case Times Audit                                                                          07/16/19 12:08:56         Owner: ALLEKH                               Modifier: ALLEKH                                                        <+>  1         Out Room Time     07/16/19 12:05:44         Owner: ALLEKH                               Modifier: ALLEKH                                                        <+> 1         Stop Time        SFPM - General Case Data                                                                                                  Entry 1                                                                                                          Case Information      ASA Class                N/A                             Case Level                      None     OR                       SF PM 01                        Specialty                       Pain Management (SN)     Wound Class              No Incision    Preop Diagnosis           M47.816                         Postop Diagnosis                M47.816    Last Modified ByFreida Busman, RN, Solmon Ice  07/16/19 11:46:13      SFPM -  Procedures                                                                                                         Entry 1                                                                                                          Procedure     Description      Procedure                Nerve Neurolysis Coolief        Modifiers                       Left     Surgical Procedure       LUMBAR COOLIEF L4-L5;     Text                     L5-S1, SACRAL ALA    Primary Procedure         Yes                             Primary Surgeon                 Rondall Allegra W-MD    Start                     07/16/19 11:38:00               Stop                            07/16/19 12:05:00    Anesthesia Type           Local                           Surgical Service                Pain Management (SN)    Wound Class               No Incision    Last Modified By:         Freida Busman, RN, Sandria Bales A                              07/16/19 12:05:49      SFPM - Dressing/Packing  Entry 1                                                                                                          Site                      Back    Dressing Item     Details      Dressing Item            Band-Aid     (Im.290)     Last Modified By:         Freida Busman, RN, Sandria Bales A                              07/16/19 11:46:19      SFPM - Procedure Setup                                                                                                    Entry 1                                                                                                          Body Position             Supine                          Prep Agents (Im.270)            Chlorhexidine Gluconate                                                                                              2% w/Alcohol    Skin Prep Agent Dry       Yes  Equipment Used                  O2 Sat    Without  Pooling     Vital signs               Yes    completed and     patient reassessed     prior to sedation     Last Modified By:         Freida Busman, RN, Sandria Bales A                              07/16/19 11:46:07      SFPM - Time Out - Procedure                                                                                               Entry 1                                                                                                          Procedure                 Nerve Neurolysis                Patient name and                Yes                              Coolief(Left)                   DOB confirmed     Surgical procedure        Yes                             Correct surgical                Yes    to be performed                                           site marked and     confirmed and                                             initials are     verified by  visible through     completed surgical                                        prepped and draped     consent                                                   field (or                                                               alternative ID band                                                               used), if applicable     Allergies discussed       Yes                             Anticoagulation                 Yes                                                              status confirmed     Time Out Complete         07/16/19 11:37:00    Last Modified By:         Zenia Resides, RN, Rubbie Battiest A                              07/16/19 11:40:04      SFPM - Debrief - Procedure                                                                                                Entry 1  Procedure                 Nerve Neurolysis                Actual procedure                Yes                               Coolief(Left)                   performed confirmed     Confirm specimens         Yes                             Patient recovery                Yes    and specimens                                             plan confirmed     labeled     appropriately (if     applicable)     Debrief Complete          07/16/19 12:05:00    Last Modified By:         Freida Busman RN, Sandria Bales A                              07/16/19 12:05:49      Case Comments                                                                                         <None>              Finalized By: Freida Busman, RN, Solmon Ice      Document Signatures                                                                             Signed By:           Freida Busman, RN, Sandria Bales A 07/16/19 12:08

## 2019-08-25 LAB — NICOTINE AND METABOLITES
Cotinine: 6.6 ng/mL
Nicotine: <1 ng/mL

## 2019-08-27 NOTE — Nursing Note (Signed)
Adult Admission Assessment - Text       Perioperative Admission Assessment Entered On:  08/27/2019 16:58 EDT    Performed On:  08/27/2019 16:43 EDT by Ashok Cordia, RN, CATHERINE               General   Call Start :   08/27/2019 16:43 EDT   Call Complete :   08/27/2019 16:57 EDT   Information Given By :   Self   Height/Length Estimated :   180 cm(Converted to: 70.87 in)    Weight   Estimated :   166 kg(Converted to: 365.967 lb)    Body Mass Index Estimated :   51.23 kg/m2   PAT Patient Procedure Verification :   Patient name and DOB confirmed with patient, Correct procedure scheduled confirmed with patient, Correct side/site confirmed with patient   Primary Care Physician/Specialists :   DR. Laurine Blazer  DR. MARK BURBRIDGE-[facility]C   Day of Proc Supp Prsn is the Emerg Cont :   No   PAT Patient/Procedure Verification :   Dylan Blankenship   Emergency Contact Phone :   [phone]   Emergency Contact Relationship :   GF   Languages :   English   Preferred Communication Mode :   Verbal   Ashok Cordia, RN, CATHERINE - 08/27/2019 16:43 EDT   Allergies   (As Of: 08/30/2019 13:15:23 EDT)   Allergies (Active)   No Known Allergies  Estimated Onset Date:   Unspecified ; Created By:   Durene Cal, RN, DEBORAH L; Reaction Status:   Active ; Category:   Drug ; Substance:   No Known Allergies ; Type:   Allergy ; Updated By:   Reatha Armour; Reviewed Date:   08/30/2019 13:15 EDT        Medication History   Medication List   (As Of: 08/30/2019 13:15:23 EDT)   Home Meds    atorvastatin  :   atorvastatin ; Status:   Documented ; Ordered As Mnemonic:   atorvastatin 20 mg oral tablet ; Simple Display Line:   20 mg, 1 tabs, Oral, Daily, 0 Refill(s) ; Catalog Code:   atorvastatin ; Order Dt/Tm:   08/27/2019 16:51:49 EDT          gabapentin  :   gabapentin ; Status:   Documented ; Ordered As Mnemonic:   gabapentin 800 mg oral tablet ; Simple Display Line:   800 mg, 1 tabs, Oral, TID, 0 Refill(s) ; Catalog Code:   gabapentin ; Order Dt/Tm:   08/27/2019  16:51:49 EDT          FLUoxetine  :   FLUoxetine ; Status:   Documented ; Ordered As Mnemonic:   FLUoxetine 40 mg oral capsule ; Simple Display Line:   80 mg, 2 caps, Oral, qAM, 0 Refill(s) ; Catalog Code:   FLUoxetine ; Order Dt/Tm:   08/27/2019 16:51:49 EDT          albuterol  :   albuterol ; Status:   Documented ; Ordered As Mnemonic:   ProAir HFA 90 mcg/inh inhalation aerosol ; Simple Display Line:   1 puffs, Inhale, q6hr, PRN: shortness of breath or wheezing, 0 Refill(s) ; Catalog Code:   albuterol ; Order Dt/Tm:   08/27/2019 16:48:27 EDT          ARIPiprazole  :   ARIPiprazole ; Status:   Documented ; Ordered As Mnemonic:   ARIPiprazole 10 mg oral tablet ; Simple Display Line:   10 mg, 1 tabs,  Oral, qAM, 0 Refill(s) ; Catalog Code:   ARIPiprazole ; Order Dt/Tm:   08/27/2019 16:51:49 EDT          warfarin  :   warfarin ; Status:   Documented ; Ordered As Mnemonic:   warfarin 10 mg oral tablet ; Simple Display Line:   10 mg, 1 tabs, Oral, Daily, 0 Refill(s) ; Catalog Code:   warfarin ; Order Dt/Tm:   08/27/2019 16:51:49 EDT          doxepin  :   doxepin ; Status:   Documented ; Ordered As Mnemonic:   doxepin 10 mg oral capsule ; Simple Display Line:   20 mg, 2 caps, Oral, qPM, 0 Refill(s) ; Catalog Code:   doxepin ; Order Dt/Tm:   08/27/2019 16:51:49 EDT            Problem History   (As Of: 08/30/2019 13:15:23 EDT)   Problems(Active)    Ankle pain, left (SNOMED CT  :427062376 )  Name of Problem:   Ankle pain, left ; Recorder:   PYLE, RN, JOYCE; Confirmation:   Confirmed ; Classification:   Patient Stated ; Code:   283151761 ; Contributor System:   PowerChart ; Last Updated:   06/27/2019 10:32 EDT ; Life Cycle Date:   06/27/2019 ; Life Cycle Status:   Active ; Vocabulary:   SNOMED CT        Anxiety (SNOMED CT  :60737106 )  Name of Problem:   Anxiety ; Recorder:   HUNTER, RN, DEBORAH L; Confirmation:   Confirmed ; Classification:   Patient Stated ; Code:   26948546 ; Contributor System:   PowerChart ; Last Updated:   05/30/2019  10:06 EDT ; Life Cycle Date:   05/30/2019 ; Life Cycle Status:   Active ; Vocabulary:   SNOMED CT        Bipolar affective (SNOMED CT  :27035009 )  Name of Problem:   Bipolar affective ; Recorder:   HUNTER, RN, DEBORAH L; Confirmation:   Confirmed ; Classification:   Patient Stated ; Code:   38182993 ; Contributor System:   PowerChart ; Last Updated:   05/30/2019 10:08 EDT ; Life Cycle Date:   05/30/2019 ; Life Cycle Status:   Active ; Vocabulary:   SNOMED CT        Depressive disorder (SNOMED CT  :71696789 )  Name of Problem:   Depressive disorder ; Recorder:   HUNTER, RN, DEBORAH L; Confirmation:   Confirmed ; Classification:   Patient Stated ; Code:   38101751 ; Contributor System:   Dietitian ; Last Updated:   05/30/2019 10:06 EDT ; Life Cycle Date:   05/30/2019 ; Life Cycle Status:   Active ; Vocabulary:   SNOMED CT        DJD (degenerative joint disease) (SNOMED CT  :0258527782 )  Name of Problem:   DJD (degenerative joint disease) ; Recorder:   PYLE, RN, JOYCE; Confirmation:   Confirmed ; Classification:   Patient Stated ; Code:   4235361443 ; Contributor System:   Dietitian ; Last Updated:   06/27/2019 10:31 EDT ; Life Cycle Date:   06/27/2019 ; Life Cycle Status:   Active ; Vocabulary:   SNOMED CT        Factor V Leiden (SNOMED CT  :154008676 )  Name of Problem:   Factor V Leiden ; Recorder:   HUNTER, RN, DEBORAH L; Confirmation:   Confirmed ; Classification:   Patient Stated ; Code:   195093267 ;  Contributor System:   Dietitian ; Last Updated:   05/30/2019 10:08 EDT ; Life Cycle Date:   05/30/2019 ; Life Cycle Status:   Active ; Vocabulary:   SNOMED CT        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 )  Name of Problem:   Frontal lobe deficit ; Recorder:   PYLE, RN, JOYCE; Confirmation:   Confirmed ; Classification:   Patient Stated ; Code:   1856314970 ; Contributor System:   PowerChart ; Last Updated:   06/27/2019 10:32 EDT ; Life Cycle Date:   06/27/2019 ; Life Cycle Status:   Active ; Vocabulary:   SNOMED CT         Hyperlipidemia (SNOMED CT  :26378588 )  Name of Problem:   Hyperlipidemia ; Recorder:   PYLE, RN, JOYCE; Confirmation:   Confirmed ; Classification:   Patient Stated ; Code:   50277412 ; Contributor System:   PowerChart ; Last Updated:   06/27/2019 10:31 EDT ; Life Cycle Date:   06/27/2019 ; Life Cycle Status:   Active ; Vocabulary:   SNOMED CT        IBS (irritable bowel syndrome) (SNOMED CT  :87867672 )  Name of Problem:   IBS (irritable bowel syndrome) ; Recorder:   HUNTER, RN, DEBORAH L; Confirmation:   Confirmed ; Classification:   Patient Stated ; Code:   09470962 ; Contributor System:   PowerChart ; Last Updated:   05/30/2019 10:06 EDT ; Life Cycle Date:   05/30/2019 ; Life Cycle Status:   Active ; Vocabulary:   SNOMED CT        Low back pain (SNOMED CT  :836629476 )  Name of Problem:   Low back pain ; Recorder:   HUNTER, RN, DEBORAH L; Confirmation:   Confirmed ; Classification:   Patient Stated ; Code:   546503546 ; Contributor System:   PowerChart ; Last Updated:   05/30/2019 10:05 EDT ; Life Cycle Date:   05/30/2019 ; Life Cycle Status:   Active ; Vocabulary:   SNOMED CT        OSA on CPAP (SNOMED CT  :568127517 )  Name of Problem:   OSA on CPAP ; Recorder:   PYLE, RN, JOYCE; Confirmation:   Confirmed ; Classification:   Patient Stated ; Code:   001749449 ; Contributor System:   PowerChart ; Last Updated:   08/27/2019 16:52 EDT ; Life Cycle Status:   Active ; Vocabulary:   SNOMED CT        Suspected sleep apnea (IMO  :67591638 )  Name of Problem:   Suspected sleep apnea ; Recorder:   SYSTEM,  SYSTEM; Confirmation:   Confirmed ; Classification:   Patient Stated ; Code:   46659935 ; Last Updated:   08/27/2019 16:58 EDT ; Life Cycle Date:   08/27/2019 ; Life Cycle Status:   Active ; Vocabulary:   IMO          Procedure History        -    Procedure History   (As Of: 08/30/2019 13:15:23 EDT)     Procedure Dt/Tm:   05/31/2019 09:33:00 EDT ; Location:   [facility] ; Provider:   Rondall Allegra W-MD; Anesthesia Type:    Local ; Anesthesia Minutes:   0 ; Procedure Name:   Medial Branch Block Lumbar (Bilateral) ; Procedure Minutes:   6 ; Comments:     05/31/2019 9:39 EDT - Gertie Baron, RN, KAY A  auto-populated  from documented surgical case ; Clinical Service:   Surgery ; Last Reviewed Dt/Tm:   08/30/2019 13:15:06 EDT            Anesthesia Minutes:   0 ; 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 ; Procedure Minutes:   0 ; Last Reviewed Dt/Tm:   08/30/2019 13:15:06 EDT            Procedure Dt/Tm:   12/26/2011 ; Anesthesia Minutes:   0 ; Procedure Name:   Colonoscopy ; Procedure Minutes:   0 ; Last Reviewed Dt/Tm:   08/30/2019 13:15:06 EDT            Anesthesia Minutes:   0 ; Procedure Name:   Vasectomy ; Procedure Minutes:   0 ; Last Reviewed Dt/Tm:   08/30/2019 13:15:06 EDT            Anesthesia Minutes:   0 ; Procedure Name:   Bilateral Arthroscopy of Knee ; Procedure Minutes:   0 ; Last Reviewed Dt/Tm:   08/30/2019 13:15:06 EDT            Anesthesia Minutes:   0 ; Procedure Name:   Left Hip Replacement x 2 ; Procedure Minutes:   0 ; Last Reviewed Dt/Tm:   08/30/2019 13:15:06 EDT            Anesthesia Minutes:   0 ; Procedure Name:   Left Ankle Fusion ; Procedure Minutes:   0 ; Comments:     05/30/2019 10:14 EDT - Durene CalHUNTER, RN, DEBORAH L  03/2017 ; Last Reviewed Dt/Tm:   08/30/2019 13:15:06 EDT            Procedure Dt/Tm:   07/02/2019 13:04:00 EDT ; Location:   [facility] ; Provider:   Rondall AllegraANDALL,  DERRICK W-MD; Anesthesia Type:   Local ; Anesthesia Minutes:   0 ; Procedure Name:   Medial Branch Block Lumbar (Bilateral) ; Procedure Minutes:   11 ; Comments:     07/02/2019 13:20 EDT - Freida BusmanAllen, RN, Sandria BalesKhali A  auto-populated from documented surgical case ; Clinical Service:   Surgery ; Last Reviewed Dt/Tm:   08/30/2019 13:15:06 EDT            Procedure Dt/Tm:   07/16/2019 11:38:00 EDT ; Location:   [facility] ; Provider:   Rondall AllegraANDALL,  DERRICK W-MD; Anesthesia Type:   Local ; Anesthesia Minutes:   0 ; Procedure Name:   Nerve Neurolysis  Coolief (Left) ; Procedure Minutes:   27 ; Comments:     07/16/2019 12:08 EDT - Freida BusmanAllen, RN, Sandria BalesKhali A  auto-populated from documented surgical case ; Clinical Service:   Surgery ; Last Reviewed Dt/Tm:   08/30/2019 13:15:06 EDT            History Confirmation   Problem History Changes PAT :   No   Procedure History Changes PAT :   No   Isabel Capricedwards,  Hanna K - 08/30/2019 13:14 EDT   Anesthesia/Sedation   Anesthesia History :   Prior general anesthesia   SN - Malignant Hyperthermia :   Denies   Previous Problem with Anesthesia :   None   Moderate Sedation History :   Prior sedation for procedure   Previous Problem With Sedation :   None   Symptoms of Sleep Apnea :   Age greater than 50, BMI greater than 35, Loud snoring, Observed apnea during sleep, Male Gender   Symptoms of Sleep Apnea Score :   5  Pregnancy Status :   N/A   Ashok Cordia RN, CATHERINE - 08/27/2019 16:43 EDT   Bloodless Medicine   Is Blood Transfusion Acceptable to Patient :   Yes   TORRANCE, RN, CATHERINE - 08/27/2019 16:43 EDT   ID Risk Screen Symptoms   Recent Travel History :   No recent travel   Close Contact with COVID-19 ID :   Preadmission testing patients only   Last 14 days COVID-19 ID :   No   TB Symptom Screen :   No symptoms   C. diff Symptom/History ID :   Neither of the above   Patient Pregnant :   None of the above   Craig, RN, CATHERINE - 08/27/2019 16:43 EDT   ID COVID-19 Screen   Fever OR Chills :   No   Headache :   No   New or Worsening Cough :   No   Fatigue :   No   Shortness of Breath ID :   No   Myalgia (Muscle Pain) :   No   Dyspnea :   No   Diarrhea :   No   Sore Throat :   No   Nausea :   No   Laryngitis :   No   Sudden Loss of Taste or Smell :   No   Ashok Cordia RN, CATHERINE - 08/27/2019 16:43 EDT   Social History   Social History   (As Of: 08/30/2019 13:15:23 EDT)   Tobacco:        Tobacco use: Former smoker, quit more than 30 days ago.   Comments:  08/27/2019 16:56 - Ashok Cordia, RN, CATHERINE: QUIT JUNE 2021   (Last Updated: 08/27/2019 16:56:26 EDT  by Ashok Cordia, RN, CATHERINE)          Alcohol:        Current, 1-2 times per month   (Last Updated: 08/27/2019 16:56:57 EDT by Ashok Cordia, RN, CATHERINE)          Substance Use:        Opioid Naive - not currently taking opioids, Denies   (Last Updated: 08/27/2019 16:57:21 EDT by Ashok Cordia RN, CATHERINE)            Advance Directive   Advance Directive :   No   Ashok Cordia, RN, CATHERINE - 08/27/2019 16:43 EDT   PAT/Clinic Comments   Additional Comments PAT :   08/28/19 COVID TEST [facility]     [place], RN, CATHERINE - 08/27/2019 16:43 EDT   Harm Screen   Injuries/Abuse/Neglect in Household :   Denies   Feels Safe Where Live :   Yes   Last 3 mo, thoughts killing self/others :   Patient denies   Isabel Caprice - 08/30/2019 13:14 EDT

## 2019-08-28 LAB — PROTIME-INR
INR: 1 — ABNORMAL LOW (ref 1.5–3.5)
Protime: 12.7 seconds (ref 11.6–14.5)

## 2019-08-28 LAB — COVID-19 (LIAT): SARS-CoV-2: NOT DETECTED

## 2019-08-30 NOTE — Case Communication (Signed)
Discharge Follow-Up Form - Text       Discharge Follow-Up Entered On:  08/31/2019 13:58 EDT    Performed On:  08/31/2019 13:58 EDT by [facility], RN, AMY B               Clinical   Provider Follow-Up Post Discharge RTF :   Follow-Up Appointments    With:  Willadean Carol L-PA  Address:  [address] [place], [place], [address];[phone]  When:  In 2 weeks  Comment:  Call our office to set your follow up appointment for two weeks after day of surgery.       [facility], RN, AMY B - 08/31/2019 13:58 EDT   Status   Case Status :   Incomplete   Phone Call History Post DC (Readmit) :   First call   WYMAN, RN, AMY B - 08/31/2019 13:58 EDT

## 2019-08-30 NOTE — Op Note (Signed)
Phase I Record - [facility]             Phase I Record - [facility] Summary                                                                   Primary Physician:        Tommy Rainwater H-MD    Case Number:              [id number]    Finalized Date/Time:      08/30/19 19:01:59    Pt. Name:                 Dylan Blankenship, Dylan Blankenship    D.O.B./Sex:               07-22-1960    Male    Med Rec #:                [id number]    Physician:                Tommy Rainwater H-MD    Financial #:              [id number]    Pt. Type:                 S    Room/Bed:                 /    Admit/Disch:              08/30/19 11:33:00 -    Institution:       [facility] Case Attendance - Phase I                                                                                            Entry 1                                                                                                          Case Attendee             Graves,  Amy-RN                 Role Performed                  Post Anesthesia Care  Nurse    Last Modified ByLuiz Blare,  Amy-RN                              08/30/19 19:01:31      [facility] - Case Times - Phase I                                                                                               Entry 1                                                                                                          Phase I In                08/30/19 18:06:00               Phase I Out                     08/30/19 18:46:00    Phase I Discharge         08/30/19 18:46:00    Time     Last Modified ByLuiz Blare,  Amy-RN                              08/30/19 19:01:50              Finalized By: Luiz Blare,  Amy-RN      Document Signatures                                                                             Signed ByLuiz Blare,  Amy-RN 08/30/19 19:01

## 2019-08-30 NOTE — Op Note (Signed)
Phase II Record - [facility]             Phase II Record - [facility] Summary                                                                  Primary Physician:        Tommy Rainwater H-MD    Case Number:              [phone]    Finalized Date/Time:      08/30/19 19:04:29    Pt. Name:                 Dylan Blankenship, Dylan Blankenship    D.O.B./Sex:               Dec 25, 1960    Male    Med Rec #:                [id number]    Physician:                Tommy Rainwater H-MD    Financial #:              [id number]    Pt. Type:                 S    Room/Bed:                 /    Admit/Disch:              08/30/19 11:33:00 -    Institution:       [facility] Case Attendance - Phase II                                                                                           Entry 1                         Entry 2                                                                          Case Attendee             LAMB,  JOSHUA H-MD              Graves,  Amy-RN    Role Performed            Surgeon Primary                 Post Anesthesia Care  Nurse    Time In     Time Out     Last Modified By:         Ballard Russell,  Amy-RN                              08/30/19 19:01:12               08/30/19 19:02:15      [facility] Case Attendance - Phase II Audit                                                            08/30/19 19:02:15         Owner: Denyce Robert                               Modifier: [email or web address]                                                        <+> 2         Case Attendee        <+> 2         Role Performed        [facility] - Case Times - Phase II                                                                                              Entry 1                                                                                                          Phase II In               08/30/19 18:46:00               Phase II Out                    08/30/19 19:02:00    Phase II Discharge         08/30/19 19:02:00    Time     Last Modified By:  Graves,  Amy-RN                              08/30/19 19:02:30              Finalized By: Rinaldo Ratel      Document Signatures                                                                             Signed ByLuiz Blare,  Amy-RN 08/30/19 19:04

## 2019-08-30 NOTE — Procedures (Signed)
 IntraOp Record - [facility]             IntraOp Record - [facility] Summary                                                                   Primary Physician:        LAMB,  JOSHUA H-MD    Case Number:              [id number]    Finalized Date/Time:      08/30/19 18:14:41    Pt. Name:                 Dylan Blankenship, Dylan Blankenship    D.O.B./Sex:               20-Sep-1960    Male    Med Rec #:                [id number]    Physician:                LAMB,  JOSHUA H-MD    Financial #:              [id number]    Pt. Type:                 S    Room/Bed:                 /    Admit/Disch:              08/30/19 11:33:00 -    Institution:       [facility] - Case Times                                                                                                         Entry 1                                                                                                          Patient      In Room Time             08/30/19 15:33:00               Out Room Time                   08/30/19 18:04:00    Anesthesia     Procedure  Start Time               08/30/19 15:53:00               Stop Time                       08/30/19 17:59:00    Last Modified By:         Melba, RN, Kyrin                              08/30/19 18:04:35      [facility] - Case Times Audit                                                                          08/30/19 18:04:35         Owner: [id number]                              Modifier: [id number]                                                       <+> 1         Out Room Time     08/30/19 17:59:16         Owner: [id number]                              Modifier: [id number]                                                       <+> 1         Stop Time     08/30/19 15:53:12         Owner: [id number]                              Modifier: [id number]                                                       <+> 1         Start Time        [facility] - Case Attendance  Entry 1                         Entry 2                         Entry 3                                          Case Attendee             ILIC,  ROMINA G-MD              LAMB,  JOSHUA H-MD              Shermon Franky CROME    Role Performed            Anesthesiologist                Surgeon Primary                 Surgical Scrub    Time In                   08/30/19 15:33:00               08/30/19 15:33:00               08/30/19 15:33:00    Time Out                  08/30/19 18:04:00               08/30/19 18:04:00               08/30/19 18:04:00    Procedure                 Ankle and/or Hind Foot          Ankle and/or Hind Foot          Bone Marrow Aspiration                              Hardware Removal(Left),         Hardware Removal(Left),         With or Without                              Ankle Arthrodesis               Ankle Arthrodesis               A(Tibial, Left)                              SCIP(Left), Bone Marrow         SCIP(Left), Bone Marrow                              Aspiration With or              Aspiration With or                              Without A(Tibial, Left)  Without A(Tibial, Left)    Last Modified By:         Melba, RN, Mariah Melba, RN, Mariah Melba, RN, Kyrin                              08/30/19 18:04:36               08/30/19 18:04:36               08/30/19 18:04:36                                Entry 4                         Entry 5                         Entry 6                                          Case Attendee             Melba, RN, Kyrin               MILLER,  MARSA ISIDRO Metro, RN, Brittney A    Role Performed            Circulator                      First Assistant                 Circulator Add'l    Time In                   08/30/19 15:33:00               08/30/19 15:33:00               08/30/19 15:15:00    Time Out                  08/30/19 18:04:00               08/30/19 18:04:00               08/30/19 15:30:00     Procedure                 Bone Marrow Aspiration          Bone Marrow Aspiration                              With or Without                 With or Without                              A(Tibial, Left)                 A(Tibial, Left)  Last Modified By:         Melba, RN, Kyrin               Fuller, RN, Kyrin               Fuller, RN, Kyrin                              08/30/19 18:04:36               08/30/19 18:04:36               08/30/19 15:46:10    General Comments:            [employer]      [facility] - Case Attendance Audit                                                                     08/30/19 18:04:36         Owner: [id number]                              Modifier: [id number]                                                           1     <*> Time Out            1     <*> Procedure            1     <*> Procedure                              Ankle and/or Hind Foot Hardware Removal(Left), Ankle Arthrodesis                                                             SCIP(Left), Bone Marrow Aspiration With or Without A(Tibial,                                                             Left)            1     <*> Procedure                              Ankle and/or Hind Foot Hardware Removal(Left), Ankle Arthrodesis  SCIP(Left), Bone Marrow Aspiration With or Without A(Tibial,                                                             Left)            2     <*> Time Out            2     <*> Procedure            2     <*> Procedure                              Ankle and/or Hind Foot Hardware Removal(Left), Ankle Arthrodesis                                                             SCIP(Left), Bone Marrow Aspiration With or Without A(Tibial,                                                             Left)            2     <*> Procedure                              Ankle and/or Hind Foot Hardware Removal(Left),  Ankle Arthrodesis                                                             SCIP(Left), Bone Marrow Aspiration With or Without A(Tibial,                                                             Left)            3     <*> Time Out            3     <*> Procedure            3     <*> Procedure                              Bone Marrow Aspiration With or Without A(Tibial, Left)            3     <*> Procedure  Bone Marrow Aspiration With or Without A(Tibial, Left)            4     <*> Time Out            4     <*> Procedure            4     <*> Procedure                              Bone Marrow Aspiration With or Without A(Tibial, Left)            4     <*> Procedure                              Bone Marrow Aspiration With or Without A(Tibial, Left)            5     <*> Time Out            5     <*> Procedure            5     <*> Procedure                              Bone Marrow Aspiration With or Without A(Tibial, Left)            5     <*> Procedure                              Bone Marrow Aspiration With or Without A(Tibial, Left)     08/30/19 15:49:38         Owner: [id number]                              Modifier: [id number]                                                           1     <*> Case Attendee                          ROJEAN GRIFFINS G-MD            1     <*> Case Attendee                          ILIC,  ROMINA G-MD            1     <*> Case Attendee                          ILIC,  ROMINA G-MD            1     <*> Case Attendee                          LEAF,  LESLIE-MD            1     <*>  Case Attendee                          LEAF,  LESLIE-MD            1     <*> Case Attendee                          LEAF,  LESLIE-MD            1     <*> Case Attendee                          LEAF,  LESLIE-MD            1     <*> Procedure            1     <*> Procedure            1     <*> Procedure                              Ankle and/or Hind Foot Hardware Removal(Left), Ankle  Arthrodesis                                                             SCIP(Left), Bone Marrow Aspiration With or Without A(Tibial,                                                             Left)            1     <*> Procedure                              Ankle and/or Hind Foot Hardware Removal(Left), Ankle Arthrodesis                                                             SCIP(Left), Bone Marrow Aspiration With or Without A(Tibial,                                                             Left)            1     <*> Procedure                              Ankle and/or Hind Foot Hardware Removal(Left), Ankle Arthrodesis  SCIP(Left), Bone Marrow Aspiration With or Without A(Tibial,                                                             Left)            1     <*> Procedure                              Ankle and/or Hind Foot Hardware Removal(Left), Ankle Arthrodesis                                                             SCIP(Left), Bone Marrow Aspiration With or Without A(Tibial,                                                             Left)     08/30/19 15:46:10         Owner: [id number]                              Modifier: [id number]                                                           6     <*> Case Attendee                          Nelwyn, RN, Brittney A            6     <*> Case Attendee                          Nelwyn, RN, Brittney A            6     <*> Case Attendee                          Nelwyn, RN, Brittney A            6     <*> Role Performed                         Circulator Add'l            6     <*> Role Performed                         Circulator Add'l            6     <*> Role Performed  Circulator Add'l            6     <*> Time In                                08/30/19 15:15:00            6     <*> Time In                                08/30/19 15:15:00            6     <*> Time In                                 08/30/19 15:15:00            6     <*> Time Out                               08/30/19 15:30:00            6     <*> Time Out                               08/30/19 15:30:00            6     <*> Time Out                               08/30/19 15:30:00     08/30/19 15:45:43         Owner: [id number]                              Modifier: [id number]                                                           1     <*> Procedure                              Ankle and/or Hind Foot Hardware Removal(Left), Ankle Arthrodesis                                                             SCIP(Left)            1     <*> Procedure                              Ankle and/or Hind Foot Hardware Removal(Left), Ankle Arthrodesis  SCIP(Left)            1     <*> Procedure                              Ankle and/or Hind Foot Hardware Removal(Left), Ankle Arthrodesis                                                             SCIP(Left)            1     <*> Procedure                              Ankle and/or Hind Foot Hardware Removal(Left), Ankle Arthrodesis                                                             SCIP(Left)            2     <*> Procedure                              Ankle and/or Hind Foot Hardware Removal(Left), Ankle Arthrodesis                                                             SCIP(Left), Bone Marrow Aspiration With or Without A(Tibial,                                                             Left)            2     <*> Procedure                              Ankle and/or Hind Foot Hardware Removal(Left), Ankle Arthrodesis                                                             SCIP(Left), Bone Marrow Aspiration With or Without A(Tibial,  Left)            2     <*> Procedure                              Ankle and/or Hind Foot Hardware Removal(Left), Ankle Arthrodesis                                                              SCIP(Left)            2     <*> Procedure                              Ankle and/or Hind Foot Hardware Removal(Left), Ankle Arthrodesis                                                             SCIP(Left)            2     <*> Procedure                              Ankle and/or Hind Foot Hardware Removal(Left), Ankle Arthrodesis                                                             SCIP(Left)            2     <*> Procedure                              Ankle and/or Hind Foot Hardware Removal(Left), Ankle Arthrodesis                                                             SCIP(Left)            3     <*> Time In                                08/30/19 15:33:00            3     <*> Time In                                08/30/19 15:33:00            3     <*> Time In  08/30/19 15:33:00            3     <*> Procedure                              Bone Marrow Aspiration With or Without A(Tibial, Left)            3     <*> Procedure                              Bone Marrow Aspiration With or Without A(Tibial, Left)            4     <*> Time In                                08/30/19 15:33:00            4     <*> Time In                                08/30/19 15:33:00            4     <*> Time In                                08/30/19 15:33:00            4     <*> Procedure                              Bone Marrow Aspiration With or Without A(Tibial, Left)            4     <*> Procedure                              Bone Marrow Aspiration With or Without A(Tibial, Left)            5     <*> Time In                                08/30/19 15:33:00            5     <*> Time In                                08/30/19 15:33:00            5     <*> Time In                                08/30/19 15:33:00            5     <*> Procedure                              Bone Marrow Aspiration With or Without A(Tibial, Left)  5      <*> Procedure                              Bone Marrow Aspiration With or Without A(Tibial, Left)     08/30/19 15:41:56         Owner: [id number]                              Modifier: [id number]                                                           1     <*> Time In                                08/30/19 15:33:00            1     <*> Time In                                08/30/19 15:33:00            1     <*> Time In                                08/30/19 15:33:00            1     <*> Time In                                08/30/19 15:33:00            1     <*> Procedure                              Ankle and/or Hind Foot Hardware Removal(Left), Ankle Arthrodesis                                                             SCIP(Left)            1     <*> Procedure                              Ankle and/or Hind Foot Hardware Removal(Left), Ankle Arthrodesis                                                             SCIP(Left)            1     <*> Procedure  Ankle and/or Hind Foot Hardware Removal(Left), Ankle Arthrodesis                                                             SCIP(Left)            1     <*> Procedure                              Ankle and/or Hind Foot Hardware Removal(Left), Ankle Arthrodesis                                                             SCIP(Left)            1     <*> Procedure                              Ankle and/or Hind Foot Hardware Removal(Left), Ankle Arthrodesis                                                             SCIP(Left)            1     <*> Procedure                              Ankle and/or Hind Foot Hardware Removal(Left), Ankle Arthrodesis                                                             SCIP(Left)            2     <*> Time In                                08/30/19 15:33:00            2     <*> Time In                                08/30/19 15:33:00            2     <*> Time In                                08/30/19  15:33:00            2     <*> Time In  08/30/19 15:33:00            2     <*> Procedure                              Ankle and/or Hind Foot Hardware Removal(Left), Ankle Arthrodesis                                                             SCIP(Left)            2     <*> Procedure                              Ankle and/or Hind Foot Hardware Removal(Left), Ankle Arthrodesis                                                             SCIP(Left)            2     <*> Procedure                              Ankle and/or Hind Foot Hardware Removal(Left), Ankle Arthrodesis                                                             SCIP(Left)            2     <*> Procedure                              Ankle and/or Hind Foot Hardware Removal(Left), Ankle Arthrodesis                                                             SCIP(Left)            2     <*> Procedure                              Ankle and/or Hind Foot Hardware Removal(Left), Ankle Arthrodesis                                                             SCIP(Left)            2     <*> Procedure  Ankle and/or Hind Foot Hardware Removal(Left), Ankle Arthrodesis                                                             SCIP(Left)            3     <*> Case Attendee                          Shermon Drivers L            3     <*> Case Attendee                          Shermon Drivers CROME            3     <*> Case Attendee                          Shermon Drivers CROME            3     <*> Case Attendee                          Shermon Drivers CROME            3     <*> Role Performed                         Surgical Scrub            3     <*> Role Performed                         Surgical Scrub            3     <*> Role Performed                         Surgical Scrub            3     <*> Role Performed                         Surgical Scrub            4     <*> Case Attendee                          Melba RN,  Kyrin            4     <*> Case Attendee                          Melba, RN, Kyrin            4     <*> Case Attendee                          Melba, RN, Kyrin            4     <*>  Case Attendee                          Melba, RN, Kyrin            4     <*> Role Performed                         Circulator            4     <*> Role Performed                         Circulator            4     <*> Role Performed                         Circulator            4     <*> Role Performed                         Circulator            5     <*> Case Attendee                          MILLER,  ALEXANDER L-PA            5     <*> Case Attendee                          MILLER,  ALEXANDER L-PA            5     <*> Case Attendee                          MILLER,  ALEXANDER L-PA            5     <*> Case Attendee                          MILLER,  ALEXANDER L-PA            5     <*> Role Performed                         First Assistant            5     <*> Role Performed                         First Assistant            5     <*> Role Performed                         First Assistant            5     <*> Role Performed                         First Assistant        [facility] - Skin Assessment  Pre-Care Text:            A.240 Assesses baseline skin condition Im.120 Implements protective measures to prevent skin or tissue injury           due to mechanical sources  Im.280.1 Implements progective measures to prevent skin or tissue injury due to           thermal sources Im.360 Monitors for signs and symptons of infection                              Entry 1                                                                                                          Skin Integrity            Intact    Last Modified ByBETHA Riding, RN, Kyrin                              08/30/19 15:48:40    Post-Care Text:            E.10 Evaluates for signs and symptoms of physical  injury to skin and tissue E.270 Evaluate tissue perfusion           O.60 Patient is free from signs and symptoms of injury caused by extraneous objects   O.210 Patinet's tissue           perfusion is consistent with or improved from baseline levels      [facility] - Patient Positioning                                                                      Pre-Care Text:            A.240 Assesses baseline skin condition A.280 Identifies baseline musculoskeletal status A.280.1 Identifies           physical alterations that require additional precautions for procedure-specific positioning A.510.8 Maintains           patient's dignity and privacy Im.120 Implements protective measures to prevent skin/tissue injury due to           mechanical sources Im.40 Positions the patient Im.80 Applies safety devices                              Entry 1  Procedure                 Bone Marrow Aspiration          Body Position                   Supine                              With or Without                              A(Tibial, Left)    Left Arm Position         Extended on Padded Arm          Right Arm Position              Extended on Padded Arm                              Board w/Security Strap                                          Board w/Security Strap    Left Leg Position         Held on Field                   Right Leg Position              Extended Security Strap    Feet Uncrossed            Yes                             Pressure Points                 Yes                                                              Checked     Positioning Device        Head Rest Gel, Safety           Positioned By                   ILIC,  ROMINA G-MD,                              Strap, Rolled Blanket                                           LAMB,  JOSHUA H-MD,  Melba, RN, Kyrin,                                                                                              Webber,  ALEXANDER L-PA    Outcome Met (O.80)        Yes    Last Modified ByBETHA Melba, RN, Kyrin                              08/30/19 16:13:14    Post-Care Text:            A.240 Assesses baseline skin condition A.280 Identifies baseline musculoskeletal status A.280.1 Identifies           physical alterations that require additional precautions for procedure-specific positioning A.510.8 Maintains           patient's dignity and privacy Im.120 Implements protective measures to prevent skin/tissue injury due to           mechanical sources Im.40 Positions the patient Im.80 Applies safety devices      [facility] - Patient Positioning Audit                                                                 08/30/19 16:13:14         Owner: [id number]                              Modifier: [id number]                                                           1     <+> Positioned By            1     <*> Positioning Device                     Head Rest Gel, Safety Strap            1     <*> Procedure                              Bone Marrow Aspiration With or Without A(Tibial, Left)            1     <+> Outcome Met (O.80)        [facility] - Skin Prep  Pre-Care Text:            A.30 Verifies allergies A.20 Verifies procedure, surgical site, and laterality A.510.8 Maintains paritnet's           dignity and privacy Im.270 Performs Skin Preparation Im.270.1 Implements protective measures to prevent skin           and tissue injury due to chemical sources  A.300.1 Protects from cross-contamination                              Entry 1                                                                                                          Hair Removal     Skin Prep      Prep Agents (Im.270)     Chlorhexidine Gluconate         Prep Area  (Im.270)              Leg Foot Left                              2% w/Alcohol     Prep Area Details        Left                            Prep By                         CLEOTILDE BLUNT L-PA    Outcome Met (O.100)       Yes    Last Modified ByBETHA Riding, RN, Kyrin                              08/30/19 15:50:28    Post-Care Text:            E.10 Evaluates for signs and symptoms of physical injury to skin and tissue O.100 Patient is free from signs           and symptoms of chemical injury  O.740 The patient's right to privacy is maintained      [facility] - Counts Initial and Final                                                                 Pre-Care Text:            A.20.2 - Assesses the risk for unintended retained surgical items Im.20 - Performs required counts  Entry 1                                                                                                          Initial Counts      Initial Counts           Barrett,  Franky CROME,              Items included in               Sponges, Sharps     Performed By             Nelwyn, RN, Brittney A         the Initial Count     Final Counts      Final Counts             Melba, RN, Kyrin,              Final Count Status              Correct     Performed By             Shermon Franky CROME     Items Included in        Sponges, Sharps     Final Count     Outcome Met (O.20)        Yes    Last Modified ByBETHA Melba, RN, Kyrin                              08/30/19 17:40:07    Post-Care Text:            E.50 - Evaluates results of the surgical count O.20 - Patient is free from unintended retained surgical items      [facility] - Counts Initial and Final Audit                                                            08/30/19 17:40:07         Owner: [id number]                              Modifier: [id number]                                                       <+> 1         Final Counts Performed By        <+> 1         Final Count Status         <+>  1         Items Included in Final Count        <+> 1         Outcome Met (O.20)        [facility] - General Case Data                                                                        Pre-Care Text:            A.350.1 Classifies surgical wound                              Entry 1                                                                                                          Case Information      ASA Class                3                               Case Level                      Level 3     OR                       RH1 05                          Specialty                       Orthopedic (SN)     Wound Class              1-Clean    Preop Diagnosis           Z98.1  M25.572                  Postop Diagnosis                Z98.1  M25.572    Last Modified ByBETHA Riding, RN, Kyrin                              08/30/19 15:53:05    Post-Care Text:            O.760 Patient receives consistent and comparable care regardless of the setting      [facility] - General Case Data Audit  08/30/19 15:53:05         Owner: [id number]                              Modifier: [id number]                                                       <+> 1         ASA Class        <+> 1         Postop Diagnosis        [facility] - Fire Risk Assessment                                                                                               Entry 1                                                                                                          Fire Risk                 Alcohol Based Prep              Fire Risk Score                 2    Assessment: If            Solution, Ignition    checked, checkmark        Source In Use    = 1 point     Last Modified By:         Melba, RN, Kyrin                              08/30/19 15:49:58      RH[facility] - Time Out                                                                                 Pre-Care Text:            A.10 Confirms  patient identity A.20 Verifies operative procedure, surgical site, and laterality A.20.1 Verifies  consent for planned procedure A.30 Verifies allergies                              Entry 1                                                                                                          Procedure                 Ankle and/or Hind Foot          Is everyone ready               Yes                              Hardware Removal(Left),         to perform time out                               Ankle Arthrodesis                              SCIP(Left), Bone Marrow                              Aspiration With or                              Without A(Tibial, Left)    Have all members of       Yes                             Patient name and                Yes    the surgical team                                         DOB confirmed     been introduced     Allergies discussed       Yes                             Surgical procedure              Yes                                                              to be performed  confirmed and                                                               verified by                                                               completed surgical                                                               consent     Correct surgical          Yes                             Correct laterality              Yes    site marked and                                           confirmed     initials are     visible through     prepped and draped     field (or     alternative ID band     used)     Correct patient           Yes                             Surgeon shares                  Yes    position confirmed                                        operative plan,                                                               possible                                                               difficulties,  expected duration,                                                               anticipated blood                                                               loss and reviews                                                               all                                                               critical/specific                                                               concerns     Required blood            Yes                             Essential imaging               Yes    products, implants,                                       available and fetal     devices and/or                                            heartones confirmed     special equipment                                         (if applicable)     available and     sterility confirmed     VTE prophylaxis           Yes                             Antibiotics ordered             Yes  addressed                                                 and administered     Anesthesia shares         Yes                             Fire risk                       Yes    anesthetic plan and                                       assessment scored     reviews patient                                           and plan discussed     specific concerns     Appropriate drying        Yes                             Surgeon states:                 Yes    time for prep                                             Does anyone have     observed before                                           any concerns? If     draping                                                   you see, suspect,                                                               or feel that                                                               patient care is  being compromised,                                                               speak up for                                                                patient safety     Time Out Complete         08/30/19 15:49:00    Last Modified By:         Melba, RN, Kyrin                              08/30/19 15:49:52    Post-Care Text:            E.30 Evaluates verification process for correct patient, site, side, and level surgery      [facility] - Time Out Audit                                                                            08/30/19 15:49:52         Owner: [id number]                              Modifier: [id number]                                                           1     <*> Procedure                              Bone Marrow Aspiration With or Without A(Tibial, Left), Ankle                                                             and/or Hind Foot Hardware Removal(Left), Ankle Arthrodesis                                                             SCIP(Left)            1     <+>  Time Out Complete     08/30/19 15:45:46         Owner: [id number]                              Modifier: [id number]                                                       <+> 1         Procedure        [facility] - Debrief                                                                                  Pre-Care Text:            Im.330 Manages specimen handling and disposition                              Entry 1                                                                                                          Procedure                 Ankle and/or Hind Foot          Final counts                    Yes                              Hardware Removal(Left),         correct and                               Ankle Arthrodesis               verbally verified                               SCIP(Left), Bone Marrow         with                               Aspiration With or              surgeon/licensed  Without A(Tibial, Left)         independent                                                               practitioner (if                                                                applicable)     Actual procedure          Yes                             Postop diagnosis                Yes    performed confirmed                                       confirmed     Wound                     Yes                             Confirm specimens               Yes    classification                                            and specimens     confirmed                                                 labeled                                                               appropriately (if                                                               applicable)     Foley catheter            Yes                             Patient recovery  Yes    removed (if                                               plan confirmed     applicable)     Debrief Complete          08/30/19 17:53:00    Last Modified By:         Melba, RN, Kyrin                              08/30/19 17:57:17    Post-Care Text:            E.800 Ensures continuity of care E.50 Evaluates results of the surgical count O.30 Patient's procedure is           performed on the correct site, side, and level O.50 patient's current status is communicated throughout the           continuum of care O.40 Patient's specimen(s) is managed in the appropriate manner      [facility] - Debrief Audit                                                                             08/30/19 17:57:17         Owner: [id number]                              Modifier: [id number]                                                       <+> 1         Procedure        <+> 1         Debrief Complete        RH[facility] - Cautery                                                                                  Pre-Care Text:            A.240 Assesses baseline skin condition A280.1 Identifies baseline musculoskeletal status Im.50 Implements           protective measures to prevent injury due to electrical sources  Im.60 Uses  supplies and equipment within safe           parameters Im.80 Applies safety devices  Entry 1                                                                                                          ESU Type                  GENERATOR                       Identification                  [id number]                              [facility]              Number     Coag Setting (watts)      45                              Cut Setting (watts)             45    Grounding Pad             Yes                             Grounding Pad Site              Abdomen left    Needed?     Grounding Pastor Riding, RN, Kyrin               Outcome Met (O.10)              Yes    Applied By     Last Modified By:         Riding, RN, Kyrin                              08/30/19 15:50:45    Post-Care Text:            E.10 Evaluates for signs and symptoms of physical injury to skin and tissue O.10 Patient is free from signs and           symptoms of injury related to thermal sources  O.70 Patient is free from signs and symptoms of electrical injury      [facility] - Patient Care Devices                                                                     Pre-Care Text:  A.200 Assesses risk for normothermia regulation A.40 Verifies presence of prosthetics or corrective devices           Im.280 Implements thermoregulation measures Im.60 Uses supplies and equipment within safe parameters                              Entry 1                                                                                                          Equipment Type            MACHINE SEQUENTIAL              SCD Sleeve Site                 Leg Right                              COMPRESSION    Equipment/Tag Number      [id number]                           Initiated Pre                   Yes                                                              Induction     Last Modified ByBETHA Riding, RN, Kyrin                               08/30/19 15:48:35    Post-Care Text:            E.10 Evaluates signs and symptoms of physical injury to skin and tissue O.60 Patient is free from signs and           symptoms of injury caused by extraneous objects      [facility] - Tourniquet                                                                               Pre-Care Text:            A.20 Verifies procedure, surgical site, and laterality A.220.2 Identifies baseline tissue perfusion A.240           Assesses baseline skin condition A.40  Verifies presence of prosthetics or corrective devices Im.120 Implements           protective measures to prevent skin or tissue injury due to mechanical sources  Im.60 Uses supplies and           equipement within safe parameters Im.80 Applies safety devices                              Entry 1                                                                                                          Tourniquet Type           TOURNIQUET PNEUMATIC            Serial Number                   [id number]    Setting                   300 mmHg                        Placement                       Leg Upper Left    Padding (Im.120)          Yes    Tourniquet Times      Inflated                 08/30/19 15:59:00               Deflated                        08/30/19 17:53:00    Applied By                LAMB,  JOSHUA H-MD              Outcome Met (O.60)              Yes    Last Modified ByBETHA Riding, RN, Kyrin                              08/30/19 17:53:42    Post-Care Text:            E.10 Evaluates for signs and symptoms of physical injury to skin and tissue E. 270 Evaluates tissue perfusion           O.210 Patient's tissue perfusion is consisent with or improved from baseline levels O.30 Patient's procedure is           performed on the correct site, side, level O.60 Patient is free from sign and symptoms of injury caused by           extraneous objects  [facility] - Tourniquet Audit                                                                           08/30/19 17:53:42         Owner: [id number]                              Modifier: [id number]                                                           1     <*> Tourniquet Type                        TOURNIQUET PNEUMATIC            1     <+> Deflated            1     <+> Outcome Met (O.60)        [facility] - Medications                                                                              Pre-Care Text:            A.10 Confirms patient identity A.30 Verifies allergies Im.220 Administers prescribed medications Im.220.2           Administers prescribed antibiotic therapy as ordered                              Entry 1                         Entry 2                                                                          Time Administered         08/30/19 15:53:00               08/30/19 16:13:00    Medication                BUPIVACAINE HCL 0.5%            HEPARIN SODIUM  INJECTION 0.5% 30ML             INJECTION 1000UTS/ML                                                              10 ML    Route of Admin            Local Injection                 Irrigation    Dose/Volume               10ML                            10ML    (include amount and     unit of measure)     Site                      Leg                             Foot    Site Detail               Left                            Left    Administered By           LAMB,  JOSHUA H-MD              LAMB,  JOSHUA H-MD    Outcome Met (O.130)       Yes                             Yes    Last Modified By:         Melba, RN, Kyrin               Fuller, RN, Kyrin                              08/30/19 17:47:07               08/30/19 16:13:54    Post-Care Text:            E.20 Evaluates response to medications O.130 Patient receives appropriately administerd medication(s)      [facility] - Medications Audit                                                                         08/30/19 17:47:07         Owner: [id number]                               Modifier: [id number]  1     <*> Medication                             BUPIVACAINE HCL 0.5% INJECTION 0.5%            1     <+> Dose/Volume (include amount and                      unit of measure)     08/30/19 16:59:16         Owner: [id number]                              Modifier: [id number]                                                           1     <*> Medication                             BUPIVACAINE HCL 0.5% INJECTION 0.5% 30ML            1     <+> Time Administered     08/30/19 16:13:54         Owner: [id number]                              Modifier: [id number]                                                       <+> 2         Medication        <+> 2         Route of Admin        <+> 2         Administered By        <+> 2         Dose/Volume (include amount and                      unit of measure)        <+> 2         Time Administered        <+> 2         Outcome Met (O.130)        <+> 2         Site        <+> 2         Site Detail        [facility] - Implants/Endoscopy Stents                                                                Pre-Care Text:  A.20 Verifies operative procedure, surgical site, and laterality A.20.1 Verifies consent for planned procedure           Im.350 Records implants inserted during the operative or invasive procedure                              Entry 1                         Entry 2                         Entry 3                                          Implant/Explant           Implant                         Implant                         Implant    Implant     Identification      Catalog #               [id number]                     [id number]                          [id number]     Description              BONE GRAFT AUGMENT 3CC          BONE CANCELLOUS CRUSHED         SCREW COMPRESSION DARCO                              *STRICT REQUIREMENT            30CC (1-4MM)                    7. X  D/T                              FOR OR DIRECTOR                                                 [employer] [id number]                              APPROVAL*     Expiration Date          03/24/21                        10/24/21     Lot Number               [id number]     Unique ID Number  [id number]     Manufacturer             [employer]                  [employer]             [facility]                              Technology                                                      Technology     Serial Number                                            [id number]    Usage Data      Implant Site             LEFT ANKLE                      LEFT ANKLE                      LEFT ANKLE     Quantity                 1                               1                               1     Bone and Tissue      Reconstituted            N/A                             N/A     Solution Used      Lot Number      Other Solution     Last Modified By:         Melba, RN, Mariah Melba, RN, Mariah Melba, RN, Kyrin                              08/30/19 17:00:21               08/30/19 17:07:45               08/30/19 17:35:04                                Entry 4                         Entry 5  Implant/Explant           Implant                         Implant    Implant     Identification      Catalog #               [id number]                        [id number]     Description              SCREW COMPRESSION DARCO         SCREW COMPRESSION DARCO                              7. X [employer]            7.0MM X [facility]                              MEDICAL [id number]                MEDICAL [id number]     Expiration Date      Lot Number      Unique ID Number      Manufacturer             [facility] MEDICAL     Serial Number     Usage Data      Implant  Site             LEFT ANKLE                      LEFT ANKLE     Quantity                 2                               2    Bone and Tissue      Reconstituted      Solution Used      Lot Number      Other Solution     Last Modified By:         Melba, RN, Mariah Melba, RN, Kyrin                              08/30/19 17:35:04               08/30/19 17:35:04    Post-Care Text:            E.30 Evaluates verification process for correct patient, site, side and level surgery O.30 Patient's procedure           is performed on the correct site, side, and level      [facility] - Implants/Endoscopy Stents Audit  08/30/19 17:35:04         Owner: [id number]                              Modifier: [id number]                                                       <+> 3         Description        <+> 3         Manufacturer        <+> 3         Implant Site        <+> 3         Quantity        <+> 3         Catalog #        <+> 3         Implant/Explant        <+> 4         Description        <+> 4         Manufacturer        <+> 4         Implant Site        <+> 4         Quantity        <+> 4         Catalog #        <+> 4         Implant/Explant        <+> 5         Description        <+> 5         Manufacturer        <+> 5         Implant Site        <+> 5         Quantity        <+> 5         Catalog #        <+> 5         Implant/Explant     08/30/19 17:07:45         Owner: [id number]                              Modifier: [id number]                                                       <+> 2         Description        <+> 2         Serial Number        <+> 2         Manufacturer        <+> 2         Expiration Date        <+> 2  Implant Site        <+> 2         Quantity        <+> 2         Catalog #        <+> 2         Implant/Explant        <+> 2         Unique ID Number        <+> 2         Reconstituted        RHOR - Communication                                                                             Pre-Care Text:            A.520 Identifies barriers to communication (Patient [employer] Family [employer]) A.20 Verifies operative           procedure, surgical site, and laterality ([employer]) Im.500 Provides status reports to family           members Im.150 Develops individualized plan of care                              Entry 1                                                                                                          Communication             Phone Call                      Communication By                Fuller, RN, Kyrin    Date and Time             08/30/19 17:32:00               Communication To                GIRL FRIEND, PATRICIA    Last Modified By:         Melba, RN, Kyrin                              08/30/19 17:32:43    Post-Care Text:            E.520 Evaluates psychosocial response to plan of care O.500 Patient or designated support person demonstrates           knowledge of the expected psychosocial responses to the procedure E.800 Ensures continuity of care O.50  Patient's current status is communicated throughout the continuum of care      [facility]OR - Dressing/Packing                                                                         Pre-Care Text:            A.350 Assesses susceptibility for infection Im.250 Administers care to invasive devices Im.290 Administer care           to wound sites  Im.300 Implements aseptic technique                              Entry 1                         Entry 2                                                                          Site                      Ankle                           Leg    Site Details              Left                            Left    Dressing Item     Details      Packing (Im.290)      Dressing Item            Medicated Gauze, 4x4's,         2x2's, Occlusive     (Im.290)                 ABD, Cotton Batting,            Dressing                               Elastic wrap     Miscellaneous      (Im.290)      Cast/Splint (Im.290)     Cast Padding,                              Fiberglass Splint    Last Modified ByBETHA Riding, RN, Kyrin               Fuller, RN, Kyrin                              08/30/19 17:51:33  08/30/19 17:59:12    Post-Care Text:            E.320 Evaluate factors associted with increased risk for postoperative infection at the completion of the           procedure O.200 Patient's wound perfusion is consistent with or improved from baseline levels  O.Patient is           free from signs and symptoms of infection      RHOR - Dressing/Packing Audit                                                                    08/30/19 17:59:12         Owner: [id number]                              Modifier: [id number]                                                           2     <*> Dressing Item (Im.290)                 Band-Aid     08/30/19 17:51:33         Owner: [id number]                              Modifier: [id number]                                                           1     <*> Dressing Item (Im.290)                 Medicated Gauze, 4x4's            1     <*> Cast/Splint (Im.290)                   Cast Padding, Plaster Cast        <+> 2         Dressing Item (Im.290)        [facility] - Procedures                                                                               Pre-Care Text:            A.20 Verifies operative procedure, surgical site, and laterality Im.150 Develops individualized plan of care  Entry 1                         Entry 2                         Entry 3                                          Procedure     Description      Procedure                Ankle and/or Hind Foot          Ankle Arthrodesis SCIP          Bone Marrow Aspiration                              Hardware Removal                                                With or Without                                                                                               Autologous Injection     Modifiers                Left                            Left                            Tibial, Left     Surgical Procedure       LEFT ANKLE HARDWARE             TIBIOTALONAVICULAR              BMA LEFT TIBIA     Text                     REMOVAL                         FUSION LEFT ANKLE    Primary Procedure         Yes                             No                              No    Primary Surgeon           LAMB,  JOSHUA H-MD  LAMB,  JOSHUA H-MD              LAMB,  JOSHUA H-MD    Start                     08/30/19 15:53:00               08/30/19 15:53:00               08/30/19 15:53:00    Stop                      08/30/19 17:59:00               08/30/19 17:59:00               08/30/19 17:59:00    Anesthesia Type           General                         General                         General    Surgical Service          Orthopedic (SN)                 Orthopedic (SN)                 Orthopedic (SN)    Wound Class               1-Clean                         1-Clean                         1-Clean    Last Modified ByBETHA Riding, RN, Mariah Riding, RN, Mariah Riding, RN, Kyrin                              08/30/19 17:59:21               08/30/19 17:59:21               08/30/19 17:59:21    Post-Care Text:            O.730 The patinet's care is consistent with the individualized perioperative plan of care      [facility] - Procedures Audit                                                                          08/30/19 17:59:21         Owner: [id number]                              Modifier: [id number]                                                       <+>  1         Stop        <+> 2         Stop        <+> 3         Stop     08/30/19 15:58:34         Owner: [id number]                              Modifier: [id number]                                                       <+> 1         Start        <+> 2         Start         <+> 3         Start        [facility] - Transfer                                                                                                           Entry 1                                                                                                          Transferred By            Melba, RN, Kyrin,              Via                             Patricio MEWS,  [facility] G-MD    Post-op Destination       PACU    Skin Assessment      Condition                Intact    Last Modified ByBETHA Melba, RN, Kyrin                              08/30/19 15:49:43  [facility] - Transfer Audit                                                                            08/30/19 15:49:43         Owner: [id number]                              Modifier: [id number]                                                           1     <*> Transferred By                         Melba RN, Kyrin        Case Comments                                                                                         <None>              Finalized By: Lorella Jeoffrey HERO      Document Signatures                                                                             Signed By:           Melba, RN, Kyrin 08/30/19 18:04          Lorella,  Amber M 08/30/19 18:14      Unfinalized History                                                                                     Date/Time            Username    Reason for Unfinalizing         Freetext Reason for Unfinalizing  08/30/19 18:11       MCLELLANA   Charging

## 2019-08-30 NOTE — Anesthesia Pre-Procedure Evaluation (Signed)
Preanesthesia Evaluation        Patient:   Dylan Blankenship, Dylan Blankenship             MRN: [id number]            FIN: [id number]               Age:   59 years     Sex:  Male     DOB:  1960-12-04   Associated Diagnoses:   None   Author:   Buford Dresser G-MD      Preoperative Information   Procedure/ Case: L ankle hardware removal   NPO:  NPO greater than 8 hours.       History of Present Illness   59yo man with Factor V Leiden, bipolar disorder, OSA on CPAP here for L ankle hardware removal and left ankle fusion      Review of Systems   negative per patient, took all AM meds, did not bring cpap      Health Status   Allergies:    Allergic Reactions (Selected)  No Known Allergies   Current medications:    Home Medications (7) Active  ARIPiprazole 10 mg oral tablet 10 mg = 1 tabs, Oral, qAM  atorvastatin 20 mg oral tablet 20 mg = 1 tabs, Oral, Daily  doxepin 10 mg oral capsule 20 mg = 2 caps, Oral, qPM  FLUoxetine 40 mg oral capsule 80 mg = 2 caps, Oral, qAM  gabapentin 800 mg oral tablet 800 mg = 1 tabs, Oral, TID  ProAir HFA 90 mcg/inh inhalation aerosol 1 puffs, PRN, Inhale, q6hr  warfarin 10 mg oral tablet 10 mg = 1 tabs, Oral, Daily  ,    Medications (12) Active  Scheduled: (2)  ceFAZolin 3g/162m NS pre-made  3 g 100 mL, IV Piggyback, On Call  sodium chloride 0.9% Inj Soln 10 mL syringe  30 mL, IV Push, q8hr  Continuous: (1)  Lactated Ringers Injection solution 1,000 mL  1,000 mL, IV, 40 mL/hr  PRN: (9)  A Patient Specific Medication  1 EA, Kit-Combo, q540m  A Patient Specific Refrigerated Medication  1 EA, Kit-Combo, q5m23m Delivery and Return Bin Access  1 EA, Kit-Combo, q5mi73mlidocaine 1% PF Inj Soln 2 mL  0.25 mL, ID, q5min39midocaine 2% Topical Gel with applicator 10-1100-581 app, Topical, q5min 40mspiratory MDI Treatment  1 EA, Kit-Combo, q5min  59mium chloride 0.9% Inj Soln 10 mL syringe  30 mL, IV Push, q5min  s26mum chloride 0.9% Inj Soln 10 mL vial PF  30 mL, IV Push, q5min  st32mle water Inj Soln 10 mL  10 mL, N/A, q5min     15moblem list:    Active Problems (12)  Ankle pain, left   Anxiety   Bipolar affective   Depressive disorder   DJD (degenerative joint disease)   Factor V Leiden   Frontal lobe deficit   Hyperlipidemia   IBS (irritable bowel syndrome)   Low back pain   OSA on CPAP   Suspected sleep apnea         Histories   Procedure history:    Nerve Neurolysis [place] (Left) on 07/16/2019 at 59 Years.  Comments:  07/16/2019 12:08 EDT - Allen, RN,Zenia Residesi A  aRubbie Battiestopulated from documented surgical case  Medial Branch Block Lumbar (Bilateral) on 07/02/2019 at 59 Years. 75omments:  07/02/2019 13:20 EDT - Allen, RN,Zenia Residesi A  aRubbie Battiestopulated from documented surgical case  Medial Branch  Block Lumbar (Bilateral) on 05/31/2019 at 15 Years.  Comments:  05/31/2019 9:39 EDT - Mylo Red, RN, KAY A  auto-populated from documented surgical case  Colonoscopy on 12/26/2011 at 68 Years.  Vasectomy ([id number]).  Left Hip Replacement x 2.  Bilateral Arthroscopy of Knee.  Left Ankle Fusion.  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   Social History        Social & Psychosocial Habits    Alcohol  08/27/2019  Use: Current    Frequency: 1-2 times per month    Substance Use  08/27/2019  Opioid Assessment Opioid Naive-not taking    Use: Denies    Tobacco  08/27/2019  Use: Former smoker, quit more    Comment: QUIT JUNE 2021 - 08/27/2019 16:56 - Dayna Barker, RN, [place]  .        Physical Examination   Vital Signs   06/28/158 10:93 EDT Systolic Blood Pressure 235 mmHg  HI    Diastolic Blood Pressure 92 mmHg  HI    Temperature Oral 36.9 degC    Heart Rate Monitored 88 bpm    Respiratory Rate 20 br/min    SpO2 99 %    SBP/DBP Cuff Details Left arm      Measurements from flowsheet : Measurements   08/30/2019 13:19 EDT Body Mass Index est meas 52.47 kg/m2    Body Mass Index Measured 52.47 kg/m2   08/30/2019 13:18 EDT Height/Length Measured 180 cm    Patient Stated Height/Length 180 cm     Weight Measured 170 kg    Weight Dosing 170 kg      General:          Stress: No acute distress.         Appearance: Obese.    Airway:          Mallampati classification: III (soft palate, base of uvula visible).         Thyromental Distance: Normal.         Mouth: Teeth ( Chipped ).    Neck:  Full range of motion.    Respiratory:  Lungs are clear to auscultation.    Cardiovascular:  Normal rate.    Integumentary:  Intact.    Neurologic:  Alert, Oriented.       Review / Management   Results review:     No qualifying data available, Lab results: 08/30/2019 13:19 EDT       Estimated Creatinine Clearance            214.48 mL/min.       Assessment and Plan   American Society of Anesthesiologists#(ASA) physical status classification:  Class II.    Anesthetic Preoperative Plan     Anesthetic technique: General anesthesia, Regional anesthesia.     Maintenance airway: Laryngeal mask airway.     Opioid Assessment: Opioid Na????ve.     Risks discussed: nausea, sore throat, dental injury, serious complications.     Signature Line     Electronically Signed on 08/30/2019 03:21 PM EDT   ________________________________________________   Buford Dresser G-MD               Modified by: Buford Dresser G-MD on 08/30/2019 03:21 PM EDT

## 2019-08-30 NOTE — Assessment & Plan Note (Signed)
PreOp Record - [facility]             PreOp Record - [facility] Summary                                                                     Primary Physician:        Tommy Rainwater H-MD    Case Number:              [id number]    Finalized Date/Time:      08/30/19 13:31:57    Pt. Name:                 Dylan Blankenship, Dylan Blankenship    D.O.B./Sex:               [birth year]    Male    Med Rec #:                [id number]    Physician:                Tommy Rainwater H-MD    Financial #:              [id number]    Pt. Type:                 S    Room/Bed:                 /    Admit/Disch:              08/30/19 11:33:00 -    Institution:       [facility] Case Attendance - PreOp                                                                                              Entry 1                         Entry 2                                                                          Case Attendee             LAMB,  JOSHUA H-MD              Isabel Caprice    Role Performed            Surgeon Primary                 Preoperative Nurse    Time In  08/30/19 13:24:00    Time Out     Last Modified By:         Alcide Clever                              08/30/19 13:21:35               08/30/19 13:21:35      [facility] - Case Times - PreOp                                                                                                 Entry 1                                                                                                          Patient In Room Time      08/30/19 13:21:00               Nurse In Time                   08/30/19 13:25:00    Nurse Out Time            08/30/19 13:31:00               Patient Ready for               08/30/19 13:31:00                                                              Surgery/Procedure     Last Modified By:         Saintclair Halsted K                              08/30/19 13:31:52      [facility] - Case Times - PreOp Audit                                                                   08/30/19 13:31:52  Owner: [id number]                              Modifier: [id number]                                                       <+> 1         Patient Ready for Surgery/Procedure        <+> 1         Nurse Out Time                Finalized By: Isabel Caprice      Document Signatures                                                                             Signed By:           Isabel Caprice 08/30/19 13:31

## 2019-08-30 NOTE — Case Communication (Signed)
Discharge Follow-Up Form - Text       Discharge Follow-Up Entered On:  08/31/2019 14:17 EDT    Performed On:  08/31/2019 14:14 EDT by Maisie Fus RN, Helmut Muster               Clinical   Provider Follow-Up Post Discharge RTF :   Follow-Up Appointments    With:  Willadean Carol L-PA  Address:  [address] [place], [place], [address];[phone]  When:  In 2 weeks  Comment:  Call our office to set your follow up appointment for two weeks after day of surgery.       Maisie Fus, RN, Helmut Muster - 08/31/2019 14:14 EDT   Surgery Evaluation   CM Surg DC Instr Clr :   Yes   CM Surg Pain Controlled :   Yes   CM Surg Nausea/Vomiting :   No   CM Surg FU Appt :   Yes   CM Surg Staff Recognize :   No   Maisie Fus RNHelmut Muster - 08/31/2019 14:14 EDT   Status   Case Status :   Completed   Maisie Fus, RNHelmut Muster - 08/31/2019 14:14 EDT

## 2019-10-18 LAB — COMPREHENSIVE METABOLIC PANEL
ALT: 20 U/L (ref 0–41)
AST: 24 U/L (ref 0–40)
Albumin/Globulin Ratio: 1.3 mmol/L (ref 1.00–2.70)
Albumin: 4 g/dL (ref 3.5–5.2)
Alk Phosphatase: 94 U/L (ref 40–130)
Anion Gap: 10 mmol/L (ref 2–17)
BUN: 15 mg/dL (ref 6–20)
CO2: 27 mmol/L (ref 22–29)
Calcium: 9.6 mg/dL (ref 8.6–10.0)
Chloride: 100 mmol/L (ref 98–107)
Creatinine: 0.6 mg/dL — ABNORMAL LOW (ref 0.7–1.3)
GFR African American: 128 mL/min/{1.73_m2} (ref >=90)
GFR Non-African American: 111 mL/min/{1.73_m2} (ref >=90)
Globulin: 3.1 g/dL (ref 1.9–4.4)
Glucose: 87 mg/dL (ref 70–99)
OSMOLALITY CALCULATED: 274 mOsm/kg (ref 270–287)
Potassium: 4.2 mmol/L (ref 3.5–5.3)
Sodium: 137 mmol/L (ref 135–145)
Total Bilirubin: 0.3 mg/dL (ref 0.00–1.20)
Total Protein: 7.1 g/dL (ref 6.4–8.3)

## 2019-10-18 LAB — CBC WITH AUTO DIFFERENTIAL
Absolute Baso #: 0 10*3/uL (ref 0.0–0.2)
Absolute Eos #: 0.2 10*3/uL (ref 0.0–0.5)
Absolute Lymph #: 1.2 10*3/uL (ref 1.0–3.2)
Absolute Mono #: 0.7 10*3/uL (ref 0.3–1.0)
Basophils %: 0.3 % (ref 0.0–2.0)
Eosinophils %: 2.5 % (ref 0.0–7.0)
Hematocrit: 42 % (ref 38.0–52.0)
Hemoglobin: 13.2 g/dL (ref 13.0–17.3)
Immature Grans (Abs): 0.03 10*3/uL (ref 0.00–0.06)
Immature Granulocytes: 0.4 % (ref 0.1–0.6)
Lymphocytes: 16.7 % (ref 15.0–45.0)
MCH: 31.6 pg (ref 27.0–34.5)
MCHC: 31.4 g/dL — ABNORMAL LOW (ref 32.0–36.0)
MCV: 100.5 fL — ABNORMAL HIGH (ref 84.0–100.0)
MPV: 8.9 fL (ref 7.2–13.2)
Monocytes: 10.3 % (ref 4.0–12.0)
NRBC Absolute: 0 10*3/uL (ref 0.000–0.012)
NRBC Automated: 0 % (ref 0.0–0.2)
Neutrophils %: 69.8 % (ref 42.0–74.0)
Neutrophils Absolute: 4.8 10*3/uL (ref 1.6–7.3)
Platelets: 226 10*3/uL (ref 140–440)
RBC: 4.18 x10e6/mcL (ref 4.00–5.60)
RDW: 14.1 % (ref 11.0–16.0)
WBC: 6.9 10*3/uL (ref 3.8–10.6)

## 2019-10-18 LAB — PROTIME-INR
INR: 2.2 (ref 1.5–3.5)
Protime: 25 seconds — ABNORMAL HIGH (ref 11.6–14.5)

## 2019-10-18 LAB — PTT: PTT: 72.9 seconds — ABNORMAL HIGH (ref 23.3–34.5)

## 2019-10-18 NOTE — ED Notes (Signed)
ED Triage Note       ED Triage Adult Entered On:  10/18/2019 10:06 EDT    Performed On:  10/18/2019 10:01 EDT by Zadie Rhine, RN, KRISTY M               Triage   Numeric Rating Pain Scale :   8   Chief Complaint :   c/o pain and swelling in r leg since 2 days. also states his l leg has been selling since his ankle surgery. is on coumadin. hx of DVT   Chief Complaint Onset :   10/18/2019 10:01 EDT   [place] Mode of Arrival :   Wheelchair   Infectious Disease Documentation :   Document assessment   Temperature Oral :   36.7 degC(Converted to: 98.1 degF)    Heart Rate Monitored :   82 bpm   Respiratory Rate :   18 br/min   Systolic Blood Pressure :   127 mmHg   Diastolic Blood Pressure :   84 mmHg   SpO2 :   97 %   Oxygen Therapy :   Room air   Patient presentation :   None of the above   Chief Complaint or Presentation suggest infection :   Yes   Dosing Weight Obtained By :   Patient stated   Weight Dosing :   170 kg(Converted to: 374 lb 13 oz)    Height :   180.3 cm(Converted to: 5 ft 11 in)    Body Mass Index Dosing :   52 kg/m2   BELLEW, RN, KRISTY M - 10/18/2019 10:01 EDT   DCP GENERIC CODE   Tracking Acuity :   3   Tracking Group :   ED [employer]   [place], RN, Soyla Dryer - 10/18/2019 10:01 EDT   ED General Section :   Document assessment   Pregnancy Status :   N/A   ED Allergies Section :   Document assessment   ED Reason for Visit Section :   Document assessment   BELLEW, RN, KRISTY M - 10/18/2019 10:01 EDT   ID Risk Screen Symptoms   Recent Travel History :   No recent travel   Close Contact with COVID-19 ID :   No   Last 14 days COVID-19 ID :   No   BELLEW, RN, KRISTY M - 10/18/2019 10:01 EDT   Allergies   (As Of: 10/18/2019 10:06:12 EDT)   Allergies (Active)   No Known Allergies  Estimated Onset Date:   Unspecified ; Created By:   Durene Cal, RN, DEBORAH L; Reaction Status:   Active ; Category:   Drug ; Substance:   No Known Allergies ; Type:   Allergy ; Updated By:   Reatha Armour; Reviewed Date:    08/30/2019 15:04 EDT        Psycho-Social   Last 3 mo, thoughts killing self/others :   Patient denies   Right click within box for Suspected Abuse policy link. :   None   Feels Safe Where Live :   Yes   BELLEW, RN, Soyla Dryer - 10/18/2019 10:01 EDT   ED Reason for Visit   (As Of: 10/18/2019 10:06:12 EDT)   Problems(Active)    Ankle pain, left (SNOMED CT  :341937902 )  Name of Problem:   Ankle pain, left ; Recorder:   PYLE, RN, JOYCE; Confirmation:   Confirmed ; Classification:   Patient Stated ; Code:   409735329 ; Contributor  System:   PowerChart ; Last Updated:   06/27/2019 10:32 EDT ; Life Cycle Date:   06/27/2019 ; Life Cycle Status:   Active ; Vocabulary:   SNOMED CT        Anxiety (SNOMED CT  :05397673 )  Name of Problem:   Anxiety ; Recorder:   HUNTER, RN, DEBORAH L; Confirmation:   Confirmed ; Classification:   Patient Stated ; Code:   41937902 ; Contributor System:   PowerChart ; Last Updated:   05/30/2019 10:06 EDT ; Life Cycle Date:   05/30/2019 ; Life Cycle Status:   Active ; Vocabulary:   SNOMED CT        Bipolar affective (SNOMED CT  :40973532 )  Name of Problem:   Bipolar affective ; Recorder:   HUNTER, RN, DEBORAH L; Confirmation:   Confirmed ; Classification:   Patient Stated ; Code:   99242683 ; Contributor System:   PowerChart ; Last Updated:   05/30/2019 10:08 EDT ; Life Cycle Date:   05/30/2019 ; Life Cycle Status:   Active ; Vocabulary:   SNOMED CT        Depressive disorder (SNOMED CT  :41962229 )  Name of Problem:   Depressive disorder ; Recorder:   HUNTER, RN, DEBORAH L; Confirmation:   Confirmed ; Classification:   Patient Stated ; Code:   79892119 ; Contributor System:   Dietitian ; Last Updated:   05/30/2019 10:06 EDT ; Life Cycle Date:   05/30/2019 ; Life Cycle Status:   Active ; Vocabulary:   SNOMED CT        DJD (degenerative joint disease) (SNOMED CT  :4174081448 )  Name of Problem:   DJD (degenerative joint disease) ; Recorder:   PYLE, RN, JOYCE; Confirmation:   Confirmed ; Classification:   Patient  Stated ; Code:   1856314970 ; Contributor System:   Dietitian ; Last Updated:   06/27/2019 10:31 EDT ; Life Cycle Date:   06/27/2019 ; Life Cycle Status:   Active ; Vocabulary:   SNOMED CT        Factor V Leiden (SNOMED CT  :263785885 )  Name of Problem:   Factor V Leiden ; Recorder:   HUNTER, RN, DEBORAH L; Confirmation:   Confirmed ; Classification:   Patient Stated ; Code:   027741287 ; Contributor System:   PowerChart ; Last Updated:   05/30/2019 10:08 EDT ; Life Cycle Date:   05/30/2019 ; Life Cycle Status:   Active ; Vocabulary:   SNOMED CT        Frontal lobe deficit (SNOMED CT  :8676720947 )  Name of Problem:   Frontal lobe deficit ; Recorder:   PYLE, RN, JOYCE; Confirmation:   Confirmed ; Classification:   Patient Stated ; Code:   0962836629 ; Contributor System:   PowerChart ; Last Updated:   06/27/2019 10:32 EDT ; Life Cycle Date:   06/27/2019 ; Life Cycle Status:   Active ; Vocabulary:   SNOMED CT        Hyperlipidemia (SNOMED CT  :47654650 )  Name of Problem:   Hyperlipidemia ; Recorder:   PYLE, RN, JOYCE; Confirmation:   Confirmed ; Classification:   Patient Stated ; Code:   35465681 ; Contributor System:   PowerChart ; Last Updated:   06/27/2019 10:31 EDT ; Life Cycle Date:   06/27/2019 ; Life Cycle Status:   Active ; Vocabulary:   SNOMED CT        IBS (irritable bowel syndrome) (SNOMED CT  :  15176160 )  Name of Problem:   IBS (irritable bowel syndrome) ; Recorder:   HUNTER, RN, DEBORAH L; Confirmation:   Confirmed ; Classification:   Patient Stated ; Code:   73710626 ; Contributor System:   PowerChart ; Last Updated:   05/30/2019 10:06 EDT ; Life Cycle Date:   05/30/2019 ; Life Cycle Status:   Active ; Vocabulary:   SNOMED CT        Low back pain (SNOMED CT  :948546270 )  Name of Problem:   Low back pain ; Recorder:   HUNTER, RN, DEBORAH L; Confirmation:   Confirmed ; Classification:   Patient Stated ; Code:   350093818 ; Contributor System:   PowerChart ; Last Updated:   05/30/2019 10:05 EDT ; Life Cycle Date:   05/30/2019  ; Life Cycle Status:   Active ; Vocabulary:   SNOMED CT        OSA on CPAP (SNOMED CT  :299371696 )  Name of Problem:   OSA on CPAP ; Recorder:   PYLE, RN, JOYCE; Confirmation:   Confirmed ; Classification:   Patient Stated ; Code:   789381017 ; Contributor System:   PowerChart ; Last Updated:   08/27/2019 16:52 EDT ; Life Cycle Status:   Active ; Vocabulary:   SNOMED CT        Suspected sleep apnea (IMO  :51025852 )  Name of Problem:   Suspected sleep apnea ; Recorder:   SYSTEM,  SYSTEM; Confirmation:   Confirmed ; Classification:   Patient Stated ; Code:   77824235 ; Last Updated:   08/27/2019 16:58 EDT ; Life Cycle Date:   08/27/2019 ; Life Cycle Status:   Active ; Vocabulary:   IMO          Diagnoses(Active)    Leg pain-swelling  Date:   10/18/2019 ; Diagnosis Type:   Reason For Visit ; Confirmation:   Complaint of ; Clinical Dx:   Leg pain-swelling ; Classification:   Medical ; Clinical Service:   Emergency medicine ; Code:   PNED ; Probability:   0 ; Diagnosis Code:   [id number]

## 2019-10-18 NOTE — ED Notes (Signed)
ED Triage Note       ED Secondary Triage Entered On:  10/18/2019 10:59 EDT    Performed On:  10/18/2019 10:58 EDT by Rock NephewALBAYALDE, RN, JENNETTE               General Information   Barriers to Learning :   None evident   ED Home Meds Section :   Document assessment   [facility] Fall Risk Section :   Document assessment   ED History Section :   Document assessment   ED Advance Directives Section :   Document assessment   ED Palliative Screen :   N/A (prefilled for <65yo)   Rock NephewALBAYALDE, RN, JENNETTE - 10/18/2019 10:58 EDT   (As Of: 10/18/2019 10:59:09 EDT)   Problems(Active)    Ankle pain, left (SNOMED CT  :[id number] )  Name of Problem:   Ankle pain, left ; Recorder:   PYLE, RN, JOYCE; Confirmation:   Confirmed ; Classification:   Patient Stated ; Code:   [id number] ; Contributor System:   PowerChart ; Last Updated:   06/27/2019 10:32 EDT ; Life Cycle Date:   06/27/2019 ; Life Cycle Status:   Active ; Vocabulary:   SNOMED CT        Anxiety (SNOMED CT  :[id number] )  Name of Problem:   Anxiety ; Recorder:   HUNTER, RN, DEBORAH L; Confirmation:   Confirmed ; Classification:   Patient Stated ; Code:   [id number] ; Contributor System:   PowerChart ; Last Updated:   05/30/2019 10:06 EDT ; Life Cycle Date:   05/30/2019 ; Life Cycle Status:   Active ; Vocabulary:   SNOMED CT        Bipolar affective (SNOMED CT  :[id number] )  Name of Problem:   Bipolar affective ; Recorder:   HUNTER, RN, DEBORAH L; Confirmation:   Confirmed ; Classification:   Patient Stated ; Code:   [id number] ; Contributor System:   PowerChart ; Last Updated:   05/30/2019 10:08 EDT ; Life Cycle Date:   05/30/2019 ; Life Cycle Status:   Active ; Vocabulary:   SNOMED CT        Depressive disorder (SNOMED CT  :[id number] )  Name of Problem:   Depressive disorder ; Recorder:   HUNTER, RN, DEBORAH L; Confirmation:   Confirmed ; Classification:   Patient Stated ; Code:   [id number] ; Contributor System:   DietitianowerChart ; Last Updated:   05/30/2019 10:06 EDT ; Life Cycle Date:   05/30/2019 ; Life Cycle  Status:   Active ; Vocabulary:   SNOMED CT        DJD (degenerative joint disease) (SNOMED CT  :[phone] )  Name of Problem:   DJD (degenerative joint disease) ; Recorder:   PYLE, RN, JOYCE; Confirmation:   Confirmed ; Classification:   Patient Stated ; Code:   [phone] ; Contributor System:   DietitianowerChart ; Last Updated:   06/27/2019 10:31 EDT ; Life Cycle Date:   06/27/2019 ; Life Cycle Status:   Active ; Vocabulary:   SNOMED CT        Factor V Leiden (SNOMED CT  :660630160450270016 )  Name of Problem:   Factor V Leiden ; Recorder:   HUNTER, RN, DEBORAH L; Confirmation:   Confirmed ; Classification:   Patient Stated ; Code:   [id number] ; Contributor System:   PowerChart ; Last Updated:   05/30/2019 10:08 EDT ; Life Cycle Date:   05/30/2019 ; Life Cycle Status:  Active ; Vocabulary:   SNOMED CT        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 )  Name of Problem:   Frontal lobe deficit ; Recorder:   PYLE, RN, JOYCE; Confirmation:   Confirmed ; Classification:   Patient Stated ; Code:   [id number] ; Contributor System:   PowerChart ; Last Updated:   06/27/2019 10:32 EDT ; Life Cycle Date:   06/27/2019 ; Life Cycle Status:   Active ; Vocabulary:   SNOMED CT        Hyperlipidemia (SNOMED CT  :84132440 )  Name of Problem:   Hyperlipidemia ; Recorder:   PYLE, RN, JOYCE; Confirmation:   Confirmed ; Classification:   Patient Stated ; Code:   [id number] ; Contributor System:   PowerChart ; Last Updated:   06/27/2019 10:31 EDT ; Life Cycle Date:   06/27/2019 ; Life Cycle Status:   Active ; Vocabulary:   SNOMED CT        IBS (irritable bowel syndrome) (SNOMED CT  :64403474 )  Name of Problem:   IBS (irritable bowel syndrome) ; Recorder:   HUNTER, RN, DEBORAH L; Confirmation:   Confirmed ; Classification:   Patient Stated ; Code:   [id number] ; Contributor System:   PowerChart ; Last Updated:   05/30/2019 10:06 EDT ; Life Cycle Date:   05/30/2019 ; Life Cycle Status:   Active ; Vocabulary:   SNOMED CT        Low back pain (SNOMED CT  :564332951 )  Name  of Problem:   Low back pain ; Recorder:   HUNTER, RN, DEBORAH L; Confirmation:   Confirmed ; Classification:   Patient Stated ; Code:   [id number] ; Contributor System:   PowerChart ; Last Updated:   05/30/2019 10:05 EDT ; Life Cycle Date:   05/30/2019 ; Life Cycle Status:   Active ; Vocabulary:   SNOMED CT        OSA on CPAP (SNOMED CT  :016010932 )  Name of Problem:   OSA on CPAP ; Recorder:   PYLE, RN, JOYCE; Confirmation:   Confirmed ; Classification:   Patient Stated ; Code:   355732202 ; Contributor System:   PowerChart ; Last Updated:   08/27/2019 16:52 EDT ; Life Cycle Status:   Active ; Vocabulary:   SNOMED CT        Suspected sleep apnea (IMO  :54270623 )  Name of Problem:   Suspected sleep apnea ; Recorder:   SYSTEM,  SYSTEM; Confirmation:   Confirmed ; Classification:   Patient Stated ; Code:   76283151 ; Last Updated:   08/27/2019 16:58 EDT ; Life Cycle Date:   08/27/2019 ; Life Cycle Status:   Active ; Vocabulary:   IMO          Diagnoses(Active)    Leg pain-swelling  Date:   10/18/2019 ; Diagnosis Type:   Reason For Visit ; Confirmation:   Complaint of ; Clinical Dx:   Leg pain-swelling ; Classification:   Medical ; Clinical Service:   Emergency medicine ; Code:   PNED ; Probability:   0 ; Diagnosis Code:   [id number]             -    Procedure History   (As Of: 10/18/2019 10:59:09 EDT)     Procedure Dt/Tm:   05/31/2019 09:33:00 EDT ; Location:   [facility] ; Provider:   Rondall Allegra W-MD; Anesthesia Type:   Local ; Anesthesia  Minutes:   0 ; Procedure Name:   Medial Branch Block Lumbar (Bilateral) ; Procedure Minutes:   6 ; Comments:     05/31/2019 9:39 EDT - Gertie Baron, RN, KAY A  auto-populated from documented surgical case ; Clinical Service:   Surgery ; Last Reviewed Dt/Tm:   10/18/2019 10:58:47 EDT            Anesthesia Minutes:   0 ; 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 ; Procedure Minutes:   0 ; Last Reviewed Dt/Tm:   10/18/2019 10:58:47 EDT             Procedure Dt/Tm:   12/26/2011 ; Anesthesia Minutes:   0 ; Procedure Name:   Colonoscopy ; Procedure Minutes:   0 ; Last Reviewed Dt/Tm:   10/18/2019 10:58:47 EDT            Anesthesia Minutes:   0 ; Procedure Name:   Vasectomy ; Procedure Minutes:   0 ; Last Reviewed Dt/Tm:   10/18/2019 10:58:47 EDT            Anesthesia Minutes:   0 ; Procedure Name:   Bilateral Arthroscopy of Knee ; Procedure Minutes:   0 ; Last Reviewed Dt/Tm:   10/18/2019 10:58:47 EDT            Anesthesia Minutes:   0 ; Procedure Name:   Left Hip Replacement x 2 ; Procedure Minutes:   0 ; Last Reviewed Dt/Tm:   10/18/2019 10:58:47 EDT            Anesthesia Minutes:   0 ; Procedure Name:   Left Ankle Fusion ; Procedure Minutes:   0 ; Comments:     05/30/2019 10:14 EDT - Durene Cal, RN, DEBORAH L  03/2017 ; Last Reviewed Dt/Tm:   10/18/2019 10:58:47 EDT            Procedure Dt/Tm:   07/02/2019 13:04:00 EDT ; Location:   [facility] ; Provider:   Rondall Allegra W-MD; Anesthesia Type:   Local ; Anesthesia Minutes:   0 ; Procedure Name:   Medial Branch Block Lumbar (Bilateral) ; Procedure Minutes:   11 ; Comments:     07/02/2019 13:20 EDT - Freida Busman, RN, Sandria Bales A  auto-populated from documented surgical case ; Clinical Service:   Surgery ; Last Reviewed Dt/Tm:   10/18/2019 10:58:47 EDT            Procedure Dt/Tm:   07/16/2019 11:38:00 EDT ; Location:   [facility] ; Provider:   Rondall Allegra W-MD; Anesthesia Type:   Local ; Anesthesia Minutes:   0 ; Procedure Name:   Nerve Neurolysis [place] (Left) ; Procedure Minutes:   27 ; Comments:     07/16/2019 12:08 EDT - Freida Busman, RN, Sandria Bales A  auto-populated from documented surgical case ; Clinical Service:   Surgery ; Last Reviewed Dt/Tm:   10/18/2019 10:58:47 EDT            Procedure Dt/Tm:   08/30/2019 15:53:00 EDT ; Location:   [facility] ; Provider:   Tommy Rainwater H-MD; Anesthesia Type:   General ; Quentin Angst G-MD; Anesthesia Minutes:   0 ; Procedure Name:   Ankle and/or Hind Foot Hardware Removal (Left) ;  Procedure Minutes:   126 ; Comments:     08/30/2019 18:04 EDT - Toni Arthurs, RN, Kyrin  auto-populated from documented surgical case ; Clinical Service:   Surgery ; Last Reviewed Dt/Tm:  10/18/2019 10:58:47 EDT            Procedure Dt/Tm:   08/30/2019 15:53:00 EDT ; Location:   [facility] ; Provider:   Tommy Rainwater H-MD; Anesthesia Type:   General ; Quentin Angst G-MD; Anesthesia Minutes:   0 ; Procedure Name:   Ankle Arthrodesis SCIP (Left) ; Procedure Minutes:   126 ; Comments:     08/30/2019 18:04 EDT - Toni Arthurs, RN, Kyrin  auto-populated from documented surgical case ; Clinical Service:   Surgery ; Last Reviewed Dt/Tm:   10/18/2019 10:58:47 EDT            Procedure Dt/Tm:   08/30/2019 15:53:00 EDT ; Location:   [facility] ; Provider:   Tommy Rainwater H-MD; Anesthesia Type:   General ; Quentin Angst G-MD; Anesthesia Minutes:   0 ; Procedure Name:   Bone Marrow Aspiration With or Without Autologous Injection (Tibial, Left) ; Procedure Minutes:   126 ; Comments:     08/30/2019 18:04 EDT - Toni Arthurs, RN, Kyrin  auto-populated from documented surgical case ; Clinical Service:   Surgery ; Last Reviewed Dt/Tm:   10/18/2019 10:58:47 EDT            [facility] Fall Risk Assessment Tool   Hx of falling last 3 months ED Fall :   No   Patient confused or disoriented ED Fall :   No   Patient intoxicated or sedated ED Fall :   No   Patient impaired gait ED Fall :   Yes   Use a mobility assistance device ED Fall :   Yes   Patient altered elimination ED Fall :   No   [facility] ED Fall Score :   2    Rock Nephew RN, JENNETTE - 10/18/2019 10:58 EDT   ED Advance Directive   Advance Directive :   No   Rock Nephew RN, JENNETTE - 10/18/2019 10:58 EDT   Social History   Social History   (As Of: 10/18/2019 10:59:09 EDT)   Tobacco:        Tobacco use: Former smoker, quit more than 30 days ago.   Comments:  08/27/2019 16:56 - Ashok Cordia, RN, CATHERINE: QUIT JUNE 2021   (Last Updated: 08/27/2019 16:56:26 EDT by Ashok Cordia, RN, CATHERINE)          Alcohol:        Current, 1-2  times per month   (Last Updated: 08/27/2019 16:56:57 EDT by Ashok Cordia, RN, CATHERINE)          Substance Use:        Opioid Naive - not currently taking opioids, Denies   (Last Updated: 08/27/2019 16:57:21 EDT by Ashok Cordia, RN, CATHERINE)            Med Hx   Medication List   (As Of: 10/18/2019 10:59:09 EDT)   Prescription/Discharge Order    nalOXone  :   nalOXone ; Status:   Prescribed ; Ordered As Mnemonic:   Narcan 4 mg/0.1 mL nasal spray ; Simple Display Line:   4 mg, Nasal, q89min, 4mg  every 2-3 minutes until patient responds or EMS arrives, PRN: overdose, 1 EA, 0 Refill(s) ; Ordering Provider:   L-PA; Catalog Code:   nalOXone ; Order Dt/Tm:   08/30/2019 18:20:24 EDT            Home Meds    atorvastatin  :   atorvastatin ; Status:   Documented ; Ordered As  Mnemonic:   atorvastatin 20 mg oral tablet ; Simple Display Line:   20 mg, 1 tabs, Oral, Daily, 0 Refill(s) ; Catalog Code:   atorvastatin ; Order Dt/Tm:   08/27/2019 16:51:49 EDT          gabapentin  :   gabapentin ; Status:   Documented ; Ordered As Mnemonic:   gabapentin 800 mg oral tablet ; Simple Display Line:   800 mg, 1 tabs, Oral, TID, 0 Refill(s) ; Catalog Code:   gabapentin ; Order Dt/Tm:   08/27/2019 16:51:49 EDT          FLUoxetine  :   FLUoxetine ; Status:   Documented ; Ordered As Mnemonic:   FLUoxetine 40 mg oral capsule ; Simple Display Line:   80 mg, 2 caps, Oral, qAM, 0 Refill(s) ; Catalog Code:   FLUoxetine ; Order Dt/Tm:   08/27/2019 16:51:49 EDT          albuterol  :   albuterol ; Status:   Documented ; Ordered As Mnemonic:   ProAir HFA 90 mcg/inh inhalation aerosol ; Simple Display Line:   1 puffs, Inhale, q6hr, PRN: shortness of breath or wheezing, 0 Refill(s) ; Catalog Code:   albuterol ; Order Dt/Tm:   08/27/2019 16:48:27 EDT          ARIPiprazole  :   ARIPiprazole ; Status:   Documented ; Ordered As Mnemonic:   ARIPiprazole 10 mg oral tablet ; Simple Display Line:   10 mg, 1 tabs, Oral, qAM, 0 Refill(s) ; Catalog Code:    ARIPiprazole ; Order Dt/Tm:   08/27/2019 16:51:49 EDT          warfarin  :   warfarin ; Status:   Documented ; Ordered As Mnemonic:   warfarin 10 mg oral tablet ; Simple Display Line:   10 mg, 1 tabs, Oral, Daily, 0 Refill(s) ; Catalog Code:   warfarin ; Order Dt/Tm:   08/27/2019 16:51:49 EDT          doxepin  :   doxepin ; Status:   Documented ; Ordered As Mnemonic:   doxepin 10 mg oral capsule ; Simple Display Line:   20 mg, 2 caps, Oral, qPM, 0 Refill(s) ; Catalog Code:   doxepin ; Order Dt/Tm:   08/27/2019 16:51:49 EDT

## 2019-10-18 NOTE — ED Notes (Signed)
 ED Patient Summary       ;       [facility]  [address], [place] [address]  [phone]  Discharge Instructions (Patient)  Name: Dylan Blankenship, Dylan Blankenship  DOB: 1960-10-26                   MRN: [id number]                   FIN: [phone]  Reason For Visit: Leg pain-swelling; L LEG SWELLING X 1 MO AND R LEG SWELLING X 2 WKS  Final Diagnosis: Cellulitis     Visit Date: 10/18/2019 09:40:00  Address: [address] [place] [address]  Phone: [phone]     Emergency Department Providers:         Primary Physician:      MERILEE BILLS C-MD      [facility] would like to thank you for allowing us  to assist you with your healthcare needs. The following includes patient education materials and information regarding your injury/illness.     Follow-up Instructions:  You were seen today on an emergency basis. Please contact your primary care doctor for a follow up appointment. If you received a referral to a specialist doctor, it is important you follow-up as instructed.    It is important that you call your follow-up doctor to schedule and confirm the location of your next appointment. Your doctor may practice at multiple locations. The office location of your follow-up appointment may be different to the one written on your discharge instructions.    If you do not have a primary care doctor, please call [phone] for help in finding a [facility] Provider. For help in finding a specialist doctor, please call [phone].    If your condition gets worse before your follow-up with your primary care doctor or specialist, please return to the Emergency Department.      Coronavirus 2019 (COVID-19) Reminders:     Patients age 41 - 24, with parental consent, and patients over age 51 can make an appointment for a COVID-19 vaccine. Patients can contact their [facility] doctors' offices to schedule an appointment to receive the COVID-19  vaccine. Patients who do not have a [facility] physician can call [phone] to schedule vaccination appointments.      Follow Up Appointments:  Primary Care Provider:     Name: HYSON,  AARON DAVID-MD     Phone: [phone]                 With: Address: When:   JOSHUA LAMB, Physician - Orthopedics [address], [address] [place], [place] [address]  [phone] Business (1) Within 1 to 2 days       With: Address: When:   BEVERLEY MUNRO, [facility] [address] [place], [place] [address]  [phone] Business (1) Within 1 to 2 days                   New Medications  Printed Prescriptions  cephalexin (Keflex 500 mg oral capsule) 1 Capsules Oral (given by mouth) every 8 hours. Refills: 0.  Last Dose:____________________  doxycycline (doxycycline monohydrate 100 mg oral capsule) 1 Capsules Oral (given by mouth) 2 times a day for 10 Days. Refills: 0.  Last Dose:____________________  Medications that have not changed  Other Medications  albuterol  (ProAir  HFA 90 mcg/inh inhalation aerosol)  1 Puffs Inhale (breathe in) every 6 hours as needed shortness of breath or wheezing.  Last Dose:____________________  ARIPiprazole (ARIPiprazole 10 mg oral tablet) 1 Tabs Oral (given by mouth) once a day (in the morning).  Last Dose:____________________  atorvastatin  (atorvastatin  20 mg oral tablet) 1 Tabs Oral (given by mouth) every day.  Last Dose:____________________  doxepin (doxepin 10 mg oral capsule) 2 Capsules Oral (given by mouth) once a day (in the evening).  Last Dose:____________________  FLUoxetine  (FLUoxetine  40 mg oral capsule) 2 Capsules Oral (given by mouth) once a day (in the morning).  Last Dose:____________________  gabapentin (gabapentin 800 mg oral tablet) 1 Tabs Oral (given by mouth) 3 times a day.  Last Dose:____________________  nalOXone (Narcan 4 mg/0.1 mL nasal spray) 4 Milligram Nasal (into the nose) every 2 minutes as needed overdose. 4mg  every 2-3 minutes until patient  responds or EMS arrives. Refills: 0.  Last Dose:____________________  warfarin (warfarin 10 mg oral tablet) 1 Tabs Oral (given by mouth) every day.  Last Dose:____________________      Allergy Info: No Known Allergies     Discharge Additional Information          Discharge Patient 10/18/19 12:00:00 EDT      Patient Education Materials:        Venous Stasis or Chronic Venous Insufficiency    Chronic venous insufficiency, also called venous stasis, is a condition that affects the veins in the legs. The condition prevents blood from being pumped through these veins effectively. Blood may no longer be pumped effectively from the legs back to the heart. This condition can range from mild to severe. With proper treatment, you should be able to continue with an active life.      CAUSES    Chronic venous insufficiency occurs when the vein walls become stretched, weakened, or damaged or when valves within the vein are damaged. Some common causes of this include:     High blood pressure inside the veins (venous hypertension).     Increased blood pressure in the leg veins from long periods of sitting or standing.     A blood clot that blocks blood flow in a vein (deep vein thrombosis).     Inflammation of a superficial vein (phlebitis) that causes a blood clot to form.     RISK FACTORS    Various things can make you more likely to develop chronic venous insufficiency, including:     Family history of this condition.     Obesity.     Pregnancy.     Sedentary lifestyle.     Smoking.     Jobs requiring long periods of standing or sitting in one place.      Being a certain age. Women in their 15s and 34s and men in their 40s are more likely to develop this condition.    SIGNS AND SYMPTOMS    Symptoms may include:      Varicose veins.     Skin breakdown or ulcers.     Reddened or discolored skin on the leg.     Brown, smooth, tight, and painful skin just above the ankle, usually on the inside surface (lipodermatosclerosis).      Swelling.     DIAGNOSIS    To diagnose this condition, your health care provider will take a medical history and do a physical exam. The following tests may be ordered to confirm the diagnosis:     Duplex ultrasound?A procedure that produces a picture of  a blood vessel and nearby organs and also provides information on blood flow through the blood vessel.     Plethysmography?A procedure that tests blood flow.     A venogram, or venography?A procedure used to look at the veins using X-ray and dye.     TREATMENT    The goals of treatment are to help you return to an active life and to minimize pain or disability. Treatment will depend on the severity of the condition. Medical procedures may be needed for severe cases. Treatment options may include:      Use of compression stockings. These can help with symptoms and lower the chances of the problem getting worse, but they do not cure the problem.     Sclerotherapy?A procedure involving an injection of a material that dissolves the damaged veins. Other veins in the network of blood vessels take over the function of the damaged veins.     Surgery to remove the vein or cut off blood flow through the vein (vein stripping or laser ablation surgery).     Surgery to repair a valve.    HOME CARE INSTRUCTIONS     Wear compression stockings as directed by your health care provider.      Only take over-the-counter or prescription medicines for pain, discomfort, or fever as directed by your health care provider.      Follow up with your health care provider as directed.    SEEK MEDICAL CARE IF:     You have redness, swelling, or increasing pain in the affected area.     You see a red streak or line that extends up or down from the affected area.     You have a breakdown or loss of skin in the affected area, even if the breakdown is small.     You have an injury to the affected area.    SEEK IMMEDIATE MEDICAL CARE IF:     You have an injury and open wound in the affected area.      Your pain is severe and does not improve with medicine.      You have sudden numbness or weakness in the foot or ankle below the affected area, or you have trouble moving your foot or ankle.     You have a fever or persistent symptoms for more than 2?3 days.      You have a fever and your symptoms suddenly get worse.     MAKE SURE YOU:     Understand these instructions.      Will watch your condition.     Will get help right away if you are not doing well or get worse.    This information is not intended to replace advice given to you by your health care provider. Make sure you discuss any questions you have with your health care provider.    Document Released: 05/17/2006 Document Revised: 11/01/2012 Document Reviewed: 09/18/2012  Elsevier Interactive Patient Education ?2016 [employer]            There was no evidence of acute DVT on your ultrasound. We're treating you for possible infection. The antibiotics you've been prescribed may interact with Coumadin  and so it is very important that you check your INR daily and if it is elevated above 3.0, call your primary care provider for further dosing advice. Return to the emergency department immediately for any major concerning changes or symptoms, especially bleeding, fevers, extreme pain or other concerns.  Cellulitis  Cellulitis is an infection of the skin and the tissue beneath it. The infected area is usually red and tender. Cellulitis occurs most often in the arms and lower legs.     CAUSES  Cellulitis is caused by bacteria that enter the skin through cracks or cuts in the skin. The most common types of bacteria that cause cellulitis are staphylococci and streptococci.  SIGNS AND SYMPTOMS  . Redness and warmth.  . Swelling.  . Tenderness or pain.  . Fever.   DIAGNOSIS  Your health care provider can usually determine what is wrong based on a physical exam. Blood tests may also be done.  TREATMENT  Treatment usually involves taking an antibiotic  medicine.  HOME CARE INSTRUCTIONS  . Take your antibiotic medicine as directed by your health care provider. Finish the antibiotic even if you start to feel better.  SABRA Keep the infected arm or leg elevated to reduce swelling.  SABRA Apply a warm cloth to the affected area up to 4 times per day to relieve pain.   . Take medicines only as directed by your health care provider.   SABRA Keep all follow-up visits as directed by your health care provider.  SEEK MEDICAL CARE IF:  . You notice red streaks coming from the infected area.  . Your red area gets larger or turns dark in color.  . Your bone or joint underneath the infected area becomes painful after the skin has healed.  . Your infection returns in the same area or another area.  . You notice a swollen bump in the infected area.  . You develop new symptoms.  . You have a fever.  SEEK IMMEDIATE MEDICAL CARE IF:  . You feel very sleepy.  . You develop vomiting or diarrhea.  . You have a general ill feeling (malaise) with muscle aches and pains.  This information is not intended to replace advice given to you by your health care provider. Make sure you discuss any questions you have with your health care provider.  Document Released: 10/21/2004 Document Revised: 10/02/2014 Document Reviewed: 11/20/2014  Elsevier Interactive Patient Education 2016 [employer].      ---------------------------------------------------------------------------------------------------------------------  [facility]) encourages you to self-enroll in the [facility] Patient Portal.  [facility] Patient Portal will allow you to manage your personal health information securely from your own electronic device now and in the future.  To begin your Patient Portal enrollment process, please visit [email or web address] Click on "Sign up now" under [facility].  If you find that you need additional assistance on the [facility] Patient Portal or need a copy  of your medical records, please call the [facility] Medical Records Office at [phone].  Comment:

## 2019-10-18 NOTE — Discharge Summary (Signed)
 ED Clinical Summary                     [facility]  [address]  [place], [place], [address]  [phone]          PERSON INFORMATION  Name: Dylan Blankenship, Dylan Blankenship Age:  59 Years DOB: 04/11/1960   Sex: Male Language: English PCP: SIERRA RIGHTER DAVID-MD   Marital Status: Single Phone: [phone] Med Service: MED-Medicine   MRN: [id number] Acct# [id number] Arrival: 10/18/2019 09:40:00   Visit Reason: Leg pain-swelling; L LEG SWELLING X 1 MO AND R LEG SWELLING X 2 WKS Acuity: 3 LOS: 000 02:43   Address:    [address] [place] [address]   Diagnosis:    Cellulitis  Medications:          New Medications  Printed Prescriptions  cephalexin (Keflex 500 mg oral capsule) 1 Capsules Oral (given by mouth) every 8 hours. Refills: 0.  Last Dose:____________________  doxycycline (doxycycline monohydrate 100 mg oral capsule) 1 Capsules Oral (given by mouth) 2 times a day for 10 Days. Refills: 0.  Last Dose:____________________  Medications that have not changed  Other Medications  albuterol  (ProAir  HFA 90 mcg/inh inhalation aerosol) 1 Puffs Inhale (breathe in) every 6 hours as needed shortness of breath or wheezing.  Last Dose:____________________  ARIPiprazole (ARIPiprazole 10 mg oral tablet) 1 Tabs Oral (given by mouth) once a day (in the morning).  Last Dose:____________________  atorvastatin  (atorvastatin  20 mg oral tablet) 1 Tabs Oral (given by mouth) every day.  Last Dose:____________________  doxepin (doxepin 10 mg oral capsule) 2 Capsules Oral (given by mouth) once a day (in the evening).  Last Dose:____________________  FLUoxetine  (FLUoxetine  40 mg oral capsule) 2 Capsules Oral (given by mouth) once a day (in the morning).  Last Dose:____________________  gabapentin (gabapentin 800 mg oral tablet) 1 Tabs Oral (given by mouth) 3 times a day.  Last Dose:____________________  nalOXone (Narcan 4 mg/0.1 mL nasal spray) 4 Milligram Nasal (into the nose) every 2 minutes as needed overdose. 4mg  every  2-3 minutes until patient responds or EMS arrives. Refills: 0.  Last Dose:____________________  warfarin (warfarin 10 mg oral tablet) 1 Tabs Oral (given by mouth) every day.  Last Dose:____________________      Medications Administered During Visit:              Allergies      No Known Allergies      Major Tests and Procedures:  The following procedures and tests were performed during your ED visit.  COMMON PROCEDURES%>  COMMON PROCEDURES COMMENTS%>                PROVIDER INFORMATION               Provider Role Assigned Sampson FRANKS, [place] C-MD ED Provider 10/18/2019 10:12:39    Dannial, RN, Butch LABOR ED Nurse 10/18/2019 11:32:16        Attending Physician:  FRANKS BILLS C-MD      Admit Doc  KODISH,  BRETT C-MD     Consulting Doc       VITALS INFORMATION  Vital Sign Triage Latest   Temp Oral ORAL_1%> ORAL%>   Temp Temporal TEMPORAL_1%> TEMPORAL%>   Temp Intravascular INTRAVASCULAR_1%> INTRAVASCULAR%>   Temp Axillary AXILLARY_1%> AXILLARY%>   Temp Rectal RECTAL_1%> RECTAL%>   02 Sat 97 % 98 %   Respiratory Rate RATE_1%> RATE%>   Peripheral Pulse Rate PULSE RATE_1%>  PULSE RATE%>   Apical Heart Rate HEART RATE_1%> HEART RATE%>   Blood Pressure BLOOD PRESSURE_1%>/ BLOOD PRESSURE_1%>84 mmHg BLOOD PRESSURE%> / BLOOD PRESSURE%>60 mmHg                 Immunizations      No Immunizations Documented This Visit          DISCHARGE INFORMATION   Discharge Disposition: H Outpt-Sent Home   Discharge Location:  Home   Discharge Date and Time:  10/18/2019 12:23:15   ED Checkout Date and Time:  10/18/2019 12:23:15     DEPART REASON INCOMPLETE INFORMATION               Depart Action Incomplete Reason   Interactive View/I&O Recently assessed   Patient Understanding Recently assessed               Problems      No Problems Documented              Smoking Status      Former smoker, quit more than 30 days ago         PATIENT EDUCATION INFORMATION  Instructions:     Venous Stasis or Chronic Venous Insufficiency; Cellulitis, Adult      Follow up:                   With: Address: When:   JOSHUA LAMB, Physician - Orthopedics [address], [address] [place], [place] [address]  [phone] Business (1) Within 1 to 2 days       With: Address: When:   BEVERLEY MUNRO, [facility] [address] [place], [place] [address]  [phone] Business (1) Within 1 to 2 days              ED PROVIDER DOCUMENTATION     Patient:   Dylan Blankenship, Dylan Blankenship             MRN: [id number]            FIN: [phone]               Age:   59 years     Sex:  Male     DOB:  1960/07/22   Associated Diagnoses:   Cellulitis   Author:   MERILEE BILLS C-MD      Basic Information   Time seen: Provider Seen (ST)   ED Provider/Time:    KODISH,  BILLS C-MD / 10/18/2019 10:12  .   Additional information: Chief Complaint from Nursing Triage Note   Chief Complaint  Chief Complaint: c/o pain and swelling in r leg since 2 days. also states his l leg has been selling since his ankle surgery. is on coumadin . hx of DVT (10/18/19 10:01:00).      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  Says it is worse in the right leg.  He had revision fusion surgery in his left ankle few weeks ago, has not been following nonweightbearing guidelines from his surgeon.  Says he has an expected level of pain in that leg.  He denies any chest pain, dyspnea, hemoptysis.  Says he has been home checking his INR levels and they have been therapeutic.      Review of Systems  Additional review of systems information: All other systems reviewed and otherwise negative.      Health Status   Allergies:    Allergic Reactions (All)  No Known Allergies.   Medications:  (Selected)   Prescriptions  Prescribed  Narcan 4 mg/0.1 mL nasal spray: 4 mg, Nasal, q2min, 4mg  every 2-3 minutes until patient responds or EMS arrives, PRN:  overdose, 1 EA, 0 Refill(s)  Documented Medications  Documented  ARIPiprazole 10 mg oral tablet: 10 mg, 1 tabs, Oral, qAM, 0 Refill(s)  FLUoxetine  40 mg oral capsule: 80 mg, 2 caps, Oral, qAM, 0 Refill(s)  ProAir  HFA 90 mcg/inh inhalation aerosol: 1 puffs, Inhale, q6hr, PRN: shortness of breath or wheezing, 0 Refill(s)  atorvastatin  20 mg oral tablet: 20 mg, 1 tabs, Oral, Daily, 0 Refill(s)  doxepin 10 mg oral capsule: 20 mg, 2 caps, Oral, qPM, 0 Refill(s)  gabapentin 800 mg oral tablet: 800 mg, 1 tabs, Oral, TID, 0 Refill(s)  warfarin 10 mg oral tablet: 10 mg, 1 tabs, Oral, Daily, 0 Refill(s).      Past Medical/ Family/ Social History   Medical history: Reviewed as documented in chart.   Surgical history: Reviewed as documented in chart.   Family history: Not significant.   Social history: Reviewed as documented in chart.   Problem list:    Active Problems (12)  Ankle pain, left   Anxiety   Bipolar affective   Depressive disorder   DJD (degenerative joint disease)   Factor V Leiden   Frontal lobe deficit   Hyperlipidemia   IBS (irritable bowel syndrome)   Low back pain   OSA on CPAP   Suspected sleep apnea   .      Physical Examination               Vital Signs   Vital Signs   10/18/2019 10:01 EDT Systolic Blood Pressure 127 mmHg    Diastolic Blood Pressure 84 mmHg    Temperature Oral 36.7 degC    Heart Rate Monitored 82 bpm    Respiratory Rate 18 br/min    SpO2 97 %   .   Measurements   10/18/2019 10:06 EDT Body Mass Index est meas 52.29 kg/m2    Body Mass Index Measured 52.29 kg/m2   10/18/2019 10:01 EDT Height/Length Measured 180.3 cm    Weight Dosing 170 kg   .   Basic Oxygen Information   10/18/2019 10:01 EDT Oxygen Therapy Room air    SpO2 97 %   .   General:  Alert, no acute distress, Morbidly obese.    Skin:  Warm, dry.    Head:  Normocephalic, atraumatic.    Neck:  Supple, trachea midline.    Eye:  Pupils are equal, round and reactive to light, extraocular movements are intact.    Cardiovascular:  Regular rate  and rhythm, No murmur, 2+ bilateral lower extremity pitting edema to the knees.  There is erythema and warmth about the bilateral mid shins without induration or fluctuance.  The left heel has a scabbed over ulceration from the surgery.  Some tenderness here without any fluctuance, discharge, erythema.  1+ dorsalis pedis pulses bilaterally.  No palpable cords.  Negative Toula' sign right.  Did not check Toula' sign left given nonweightbearing recent postoperative from fusion.SABRA    Respiratory:  Lungs are clear to auscultation, respirations are non-labored.    Chest wall:  No tenderness, No deformity.    Back:  Nontender, Normal range of motion.  Musculoskeletal:  Normal ROM, normal strength.    Gastrointestinal:  Soft, Nontender, Non distended, Normal bowel sounds.    Neurological:  Alert and oriented to person, place, time, and situation, No focal neurological deficit observed, normal motor observed, normal speech observed.    Psychiatric:  Cooperative, appropriate mood & affect.       Medical Decision Making   Evaluate for DVT, ultrasound pending.  Otherwise more suspicious for venous stasis dermatitis, cellulitis.  Check white count, procalcitonin.  No signs of PE.  Brisk capillary refill.  No obvious foot or postoperative infection.   Results review:  Lab results : Lab View   10/18/2019 11:34 EDT Estimated Creatinine Clearance 214.79 mL/min   10/18/2019 10:57 EDT WBC 6.9 x10e3/mcL    RBC 4.18 x10e6/mcL    Hgb 13.2 g/dL    HCT 57.9 %    MCV 899.4 fL  HI    MCH 31.6 pg    MCHC 31.4 g/dL  LOW    RDW 85.8 %    Platelet 226 x10e3/mcL    MPV 8.9 fL    Neutro Auto 69.8 %    Neutro Absolute 4.8 x10e3/mcL    Immature Grans Percent 0.4 %    Immature Grans Absolute 0.03 x10e3/mcL    Lymph Auto 16.7 %    Lymph Absolute 1.2 x10e3/mcL    Mono Auto 10.3 %    Mono Absolute 0.7 x10e3/mcL    Eosinophil Percent 2.5 %    Eos Absolute 0.2 x10e3/mcL    Basophil Auto 0.3 %    Baso Absolute 0.0 x10e3/mcL    NRBC Absolute Auto 0.000  x10e3/mcL    NRBC Percent Auto 0.0 %    PT 25.0 seconds  HI    INR 2.2    PTT 72.9 seconds  HI    Sodium Lvl 137 mmol/L    Potassium Lvl 4.2 mmol/L    Chloride 100 mmol/L    CO2 27 mmol/L    Glucose Random 87 mg/dL    BUN 15 mg/dL    Creatinine Lvl 0.6 mg/dL  LOW    AGAP 10 mmol/L    Osmolality Calc 274 mOsm/kg    Calcium  Lvl 9.6 mg/dL    Protein Total 7.1 g/dL    Albumin Lvl 4.0 g/dL    Globulin Calc 3.1 g/dL    AG Ratio Calc 8.69 mmol/L    Alk Phos 94 unit/L    AST 24 unit/L    ALT 20 unit/L    eGFR AA 128 mL/min/1.66m    eGFR Non-AA 111 mL/min/1.13m    Bili Total 0.30 mg/dL   0/76/7978 89:93 EDT Estimated Creatinine Clearance 214.79 mL/min   .   Radiology results:  Rad Results (ST)   XR Ankle Complete Left  ?  10/18/19 11:14:51  XR Ankle Complete Left: 10/18/19    Indication: Pain, Joint, Ankle/Foot,    Comparison: CT of the ankle 07/16/2019.    Findings: 3 views left ankle available for interpretation. Interval removal of  previous distal left tibia and ankle hardware. There are now 4 metallic screws  with fixation of the ankle joint and subtalar joint. As noted previously there  appears to be bony fusion across the ankle joint but not the subtalar joint. No  evidence of fracture of the screws. The tip of the more superior calcaneal tibia  screw extends minimally through the anterior cortex of the tibia. The tip of the  calcaneal talar screw extends minimally through anterior cortex of the talus.  Diffuse soft tissue swelling. Lateral soft tissue calcifications likely  dystrophic calcifications. Persistent deformity of the inferior fibula which  appears to be unchanged.    Impression: Posttraumatic and postsurgical changes as described.  ?  Signed By: ARLEE, DAVID J-MD  .      Reexamination/ Reevaluation   Time: 10/18/2019 11:57:00 .   Notes: Duplex was negative for acute DVT bilaterally. 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 The patient is therapeutic on his Coumadin . Will treat with oral antibiotics for  possible cellulitis, though with relatively symmetric bilateral changes venous stasis dermatitis is favored. Patient can follow-up with his surgeon..      Impression and Plan   Diagnosis   Cellulitis (ICD10-CM L03.90, Discharge, Medical)   Plan   Condition: Stable.    Disposition: Discharged: Time  10/18/2019 11:57:00, to home.    Prescriptions: Launch prescriptions   Pharmacy:  Keflex 500 mg oral capsule (Prescribe): 500 mg, 1 caps, Oral, q8hr, 30 caps, 0 Refill(s)  doxycycline monohydrate 100 mg oral capsule (Prescribe): 100 mg, 1 caps, Oral, BID, for 10 days, 20 caps, 0 Refill(s).    Patient was given the following educational materials: Cellulitis, Adult, Venous Stasis or Chronic Venous Insufficiency, Venous Stasis or Chronic Venous Insufficiency, Cellulitis, Adult.    Follow up with: AARON HYSON, [facility] Within 1 week, AARON HYSON, [facility] Within 1 to 2 days; JOSHUA LAMB, Physician - Orthopedics Within 1 to 2 days.    Counseled: I had a detailed discussion with the patient and/or guardian regarding the historical points/exam findings supporting the discharge diagnosis and need for outpatient followup. Discussed the need to return to the ER if symptoms persist/worsen, or for any questions/concerns that arise at home.

## 2019-10-18 NOTE — ED Provider Notes (Signed)
Leg pain-swelling        Patient:   Dylan Blankenship, Dylan Blankenship             MRN: [id number]            FIN: [id number]               Age:   59 years     Sex:  Male     DOB:  March 30, 1960   Associated Diagnoses:   Cellulitis   Author:   Thornton Papas C-MD      Basic Information   Time seen: Provider Seen (ST)   ED Provider/Time:    Norvella Loscalzo,  Hallel Denherder C-MD / 10/18/2019 10:12  .   Additional information: Chief Complaint from Nursing Triage Note   Chief Complaint  Chief Complaint: c/o pain and swelling in r leg since 2 days. also states his l leg has been selling since his ankle surgery. is on coumadin. hx of DVT (10/18/19 10:01:00).      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  Says it is worse in the right leg.  He had revision fusion surgery in his left ankle few weeks ago, has not been following nonweightbearing guidelines from his surgeon.  Says he has an expected level of pain in that leg.  He denies any chest pain, dyspnea, hemoptysis.  Says he has been home checking his INR levels and they have been therapeutic.      Review of Systems             Additional review of systems information: All other systems reviewed and otherwise negative.      Health Status   Allergies:    Allergic Reactions (All)  No Known Allergies.   Medications:  (Selected)   Prescriptions  Prescribed  Narcan 4 mg/0.1 mL nasal spray: 4 mg, Nasal, q5mn, 484mevery 2-3 minutes until patient responds or EMS arrives, PRN: overdose, 1 EA, 0 Refill(s)  Documented Medications  Documented  ARIPiprazole 10 mg oral tablet: 10 mg, 1 tabs, Oral, qAM, 0 Refill(s)  FLUoxetine 40 mg oral capsule: 80 mg, 2 caps, Oral, qAM, 0 Refill(s)  ProAir HFA 90 mcg/inh inhalation aerosol: 1 puffs, Inhale, q6hr, PRN: shortness of breath or wheezing, 0 Refill(s)  atorvastatin 20 mg oral tablet: 20 mg, 1 tabs, Oral,  Daily, 0 Refill(s)  doxepin 10 mg oral capsule: 20 mg, 2 caps, Oral, qPM, 0 Refill(s)  gabapentin 800 mg oral tablet: 800 mg, 1 tabs, Oral, TID, 0 Refill(s)  warfarin 10 mg oral tablet: 10 mg, 1 tabs, Oral, Daily, 0 Refill(s).      Past Medical/ Family/ Social History   Medical history: Reviewed as documented in chart.   Surgical history: Reviewed as documented in chart.   Family history: Not significant.   Social history: Reviewed as documented in chart.   Problem list:    Active Problems (12)  Ankle pain, left   Anxiety   Bipolar affective   Depressive disorder   DJD (degenerative joint disease)   Factor V Leiden   Frontal lobe deficit   Hyperlipidemia   IBS (irritable bowel syndrome)   Low back pain   OSA on CPAP   Suspected sleep apnea   .      Physical Examination  Vital Signs   Vital Signs   9/32/3557 32:20 EDT Systolic Blood Pressure 254 mmHg    Diastolic Blood Pressure 84 mmHg    Temperature Oral 36.7 degC    Heart Rate Monitored 82 bpm    Respiratory Rate 18 br/min    SpO2 97 %   .   Measurements   10/18/2019 10:06 EDT Body Mass Index est meas 52.29 kg/m2    Body Mass Index Measured 52.29 kg/m2   10/18/2019 10:01 EDT Height/Length Measured 180.3 cm    Weight Dosing 170 kg   .   Basic Oxygen Information   10/18/2019 10:01 EDT Oxygen Therapy Room air    SpO2 97 %   .   General:  Alert, no acute distress, Morbidly obese.    Skin:  Warm, dry.    Head:  Normocephalic, atraumatic.    Neck:  Supple, trachea midline.    Eye:  Pupils are equal, round and reactive to light, extraocular movements are intact.    Cardiovascular:  Regular rate and rhythm, No murmur, 2+ bilateral lower extremity pitting edema to the knees.  There is erythema and warmth about the bilateral mid shins without induration or fluctuance.  The left heel has a scabbed over ulceration from the surgery.  Some tenderness here without any fluctuance, discharge, erythema.  1+ dorsalis pedis pulses bilaterally.  No palpable cords.  Negative  Bevelyn Buckles' sign right.  Did not check Bevelyn Buckles' sign left given nonweightbearing recent postoperative from fusion.Marland Kitchen    Respiratory:  Lungs are clear to auscultation, respirations are non-labored.    Chest wall:  No tenderness, No deformity.    Back:  Nontender, Normal range of motion.    Musculoskeletal:  Normal ROM, normal strength.    Gastrointestinal:  Soft, Nontender, Non distended, Normal bowel sounds.    Neurological:  Alert and oriented to person, place, time, and situation, No focal neurological deficit observed, normal motor observed, normal speech observed.    Psychiatric:  Cooperative, appropriate mood & affect.       Medical Decision Making   Evaluate for DVT, ultrasound pending.  Otherwise more suspicious for venous stasis dermatitis, cellulitis.  Check white count, procalcitonin.  No signs of PE.  Brisk capillary refill.  No obvious foot or postoperative infection.   Results review:  Lab results : Lab View   10/18/2019 11:34 EDT Estimated Creatinine Clearance 214.79 mL/min   10/18/2019 10:57 EDT WBC 6.9 x10e3/mcL    RBC 4.18 x10e6/mcL    Hgb 13.2 g/dL    HCT 42.0 %    MCV 100.5 fL  HI    MCH 31.6 pg    MCHC 31.4 g/dL  LOW    RDW 14.1 %    Platelet 226 x10e3/mcL    MPV 8.9 fL    Neutro Auto 69.8 %    Neutro Absolute 4.8 x10e3/mcL    Immature Grans Percent 0.4 %    Immature Grans Absolute 0.03 x10e3/mcL    Lymph Auto 16.7 %    Lymph Absolute 1.2 x10e3/mcL    Mono Auto 10.3 %    Mono Absolute 0.7 x10e3/mcL    Eosinophil Percent 2.5 %    Eos Absolute 0.2 x10e3/mcL    Basophil Auto 0.3 %    Baso Absolute 0.0 x10e3/mcL    NRBC Absolute Auto 0.000 x10e3/mcL    NRBC Percent Auto 0.0 %    PT 25.0 seconds  HI    INR 2.2    PTT 72.9 seconds  HI  Sodium Lvl 137 mmol/L    Potassium Lvl 4.2 mmol/L    Chloride 100 mmol/L    CO2 27 mmol/L    Glucose Random 87 mg/dL    BUN 15 mg/dL    Creatinine Lvl 0.6 mg/dL  LOW    AGAP 10 mmol/L    Osmolality Calc 274 mOsm/kg    Calcium Lvl 9.6 mg/dL    Protein Total 7.1 g/dL    Albumin  Lvl 4.0 g/dL    Globulin Calc 3.1 g/dL    AG Ratio Calc 1.30 mmol/L    Alk Phos 94 unit/L    AST 24 unit/L    ALT 20 unit/L    eGFR AA 128 mL/min/1.69m???    eGFR Non-AA 111 mL/min/1.751m??    Bili Total 0.30 mg/dL   10/18/2019 10:06 EDT Estimated Creatinine Clearance 214.79 mL/min   .   Radiology results:  Rad Results (ST)   XR Ankle Complete Left  ?  10/18/19 11:14:51  XR Ankle Complete Left: 10/18/19    Indication: Pain, Joint, Ankle/Foot,    Comparison: CT of the ankle 07/16/2019.    Findings: 3 views left ankle available for interpretation. Interval removal of  previous distal left tibia and ankle hardware. There are now 4 metallic screws  with fixation of the ankle joint and subtalar joint. As noted previously there  appears to be bony fusion across the ankle joint but not the subtalar joint. No  evidence of fracture of the screws. The tip of the more superior calcaneal tibia  screw extends minimally through the anterior cortex of the tibia. The tip of the  calcaneal talar screw extends minimally through anterior cortex of the talus.  Diffuse soft tissue swelling. Lateral soft tissue calcifications likely  dystrophic calcifications. Persistent deformity of the inferior fibula which  appears to be unchanged.    Impression: Posttraumatic and postsurgical changes as described.  ?  Signed By: SKPolly CobiaDAVID J-MD  .      Reexamination/ Reevaluation   Time: 10/18/2019 11:57:00 .   Notes: Duplex was negative for acute DVT bilaterally. 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 The patient is therapeutic on his Coumadin. Will treat with oral antibiotics for possible cellulitis, though with relatively symmetric bilateral changes venous stasis dermatitis is favored. Patient can follow-up with his surgeon..      Impression and Plan   Diagnosis   Cellulitis (ICD10-CM L03.90, Discharge, Medical)   Plan   Condition: Stable.    Disposition: Discharged: Time  10/18/2019 11:57:00, to home.    Prescriptions: Launch prescriptions    Pharmacy:  Keflex 500 mg oral capsule (Prescribe): 500 mg, 1 caps, Oral, q8hr, 30 caps, 0 Refill(s)  doxycycline monohydrate 100 mg oral capsule (Prescribe): 100 mg, 1 caps, Oral, BID, for 10 days, 20 caps, 0 Refill(s).    Patient was given the following educational materials: Cellulitis, Adult, Venous Stasis or Chronic Venous Insufficiency, Venous Stasis or Chronic Venous Insufficiency, Cellulitis, Adult.    Follow up with: [facility] Within 1 week, AARON HYSON, [facility] Within 1 to 2 days; JOSHUA LAMB, Physician - Orthopedics Within 1 to 2 days.    Counseled: I had a detailed discussion with the patient and/or guardian regarding the historical points/exam findings supporting the discharge diagnosis and need for outpatient followup. Discussed the need to return to the ER if symptoms persist/worsen, or for any questions/concerns that arise at home.    SiCamera operatorn  10/18/2019 12:00 PM EDT   ________________________________________________   Thornton Papas C-MD               Modified by: Thornton Papas C-MD on 10/18/2019 12:00 PM EDT

## 2019-10-18 NOTE — ED Notes (Signed)
 ED Patient Education Note     Patient Education Materials Follows:  Cardiovascular     Venous Stasis or Chronic Venous Insufficiency    Chronic venous insufficiency, also called venous stasis, is a condition that affects the veins in the legs. The condition prevents blood from being pumped through these veins effectively. Blood may no longer be pumped effectively from the legs back to the heart. This condition can range from mild to severe. With proper treatment, you should be able to continue with an active life.      CAUSES    Chronic venous insufficiency occurs when the vein walls become stretched, weakened, or damaged or when valves within the vein are damaged. Some common causes of this include:     High blood pressure inside the veins (venous hypertension).     Increased blood pressure in the leg veins from long periods of sitting or standing.     A blood clot that blocks blood flow in a vein (deep vein thrombosis).     Inflammation of a superficial vein (phlebitis) that causes a blood clot to form.     RISK FACTORS    Various things can make you more likely to develop chronic venous insufficiency, including:     Family history of this condition.     Obesity.     Pregnancy.     Sedentary lifestyle.     Smoking.     Jobs requiring long periods of standing or sitting in one place.      Being a certain age. Women in their 1s and 60s and men in their 63s are more likely to develop this condition.    SIGNS AND SYMPTOMS    Symptoms may include:      Varicose veins.     Skin breakdown or ulcers.     Reddened or discolored skin on the leg.     Brown, smooth, tight, and painful skin just above the ankle, usually on the inside surface (lipodermatosclerosis).     Swelling.     DIAGNOSIS    To diagnose this condition, your health care provider will take a medical history and do a physical exam. The following tests may be ordered to confirm the diagnosis:     Duplex ultrasound?A procedure that produces a picture of a  blood vessel and nearby organs and also provides information on blood flow through the blood vessel.     Plethysmography?A procedure that tests blood flow.     A venogram, or venography?A procedure used to look at the veins using X-ray and dye.     TREATMENT    The goals of treatment are to help you return to an active life and to minimize pain or disability. Treatment will depend on the severity of the condition. Medical procedures may be needed for severe cases. Treatment options may include:      Use of compression stockings. These can help with symptoms and lower the chances of the problem getting worse, but they do not cure the problem.     Sclerotherapy?A procedure involving an injection of a material that dissolves the damaged veins. Other veins in the network of blood vessels take over the function of the damaged veins.     Surgery to remove the vein or cut off blood flow through the vein (vein stripping or laser ablation surgery).     Surgery to repair a valve.    HOME CARE INSTRUCTIONS     Wear compression stockings as directed  by your health care provider.      Only take over-the-counter or prescription medicines for pain, discomfort, or fever as directed by your health care provider.      Follow up with your health care provider as directed.    SEEK MEDICAL CARE IF:     You have redness, swelling, or increasing pain in the affected area.     You see a red streak or line that extends up or down from the affected area.     You have a breakdown or loss of skin in the affected area, even if the breakdown is small.     You have an injury to the affected area.    SEEK IMMEDIATE MEDICAL CARE IF:     You have an injury and open wound in the affected area.     Your pain is severe and does not improve with medicine.      You have sudden numbness or weakness in the foot or ankle below the affected area, or you have trouble moving your foot or ankle.     You have a fever or persistent symptoms for more than 2?3  days.      You have a fever and your symptoms suddenly get worse.     MAKE SURE YOU:     Understand these instructions.      Will watch your condition.     Will get help right away if you are not doing well or get worse.    This information is not intended to replace advice given to you by your health care provider. Make sure you discuss any questions you have with your health care provider.    Document Released: 05/17/2006 Document Revised: 11/01/2012 Document Reviewed: 09/18/2012  [employer] Interactive Patient Education ?2016 [employer]      Home Health Care          There was no evidence of acute DVT on your ultrasound. We're treating you for possible infection. The antibiotics you've been prescribed may interact with Coumadin  and so it is very important that you check your INR daily and if it is elevated above 3.0, call your primary care provider for further dosing advice. Return to the emergency department immediately for any major concerning changes or symptoms, especially bleeding, fevers, extreme pain or other concerns.    Cellulitis  Cellulitis is an infection of the skin and the tissue beneath it. The infected area is usually red and tender. Cellulitis occurs most often in the arms and lower legs.     CAUSES  Cellulitis is caused by bacteria that enter the skin through cracks or cuts in the skin. The most common types of bacteria that cause cellulitis are staphylococci and streptococci.  SIGNS AND SYMPTOMS  . Redness and warmth.  . Swelling.  . Tenderness or pain.  . Fever.   DIAGNOSIS  Your health care provider can usually determine what is wrong based on a physical exam. Blood tests may also be done.  TREATMENT  Treatment usually involves taking an antibiotic medicine.  HOME CARE INSTRUCTIONS  . Take your antibiotic medicine as directed by your health care provider. Finish the antibiotic even if you start to feel better.  SABRA Keep the infected arm or leg elevated to reduce swelling.  SABRA Apply a warm cloth  to the affected area up to 4 times per day to relieve pain.   . Take medicines only as directed by your health care provider.   SABRA  Keep all follow-up visits as directed by your health care provider.  SEEK MEDICAL CARE IF:  . You notice red streaks coming from the infected area.  . Your red area gets larger or turns dark in color.  . Your bone or joint underneath the infected area becomes painful after the skin has healed.  . Your infection returns in the same area or another area.  . You notice a swollen bump in the infected area.  . You develop new symptoms.  . You have a fever.  SEEK IMMEDIATE MEDICAL CARE IF:  . You feel very sleepy.  . You develop vomiting or diarrhea.  . You have a general ill feeling (malaise) with muscle aches and pains.  This information is not intended to replace advice given to you by your health care provider. Make sure you discuss any questions you have with your health care provider.  Document Released: 10/21/2004 Document Revised: 10/02/2014 Document Reviewed: 11/20/2014  [employer] Interactive Patient Education [employer]

## 2019-10-23 LAB — CULTURE, BLOOD 1

## 2020-05-23 NOTE — Nursing Note (Signed)
Adult Admission Assessment - Text       Perioperative Admission Assessment Entered On:  05/23/2020 12:06 EDT    Performed On:  05/23/2020 12:06 EDT by Tristan Schroeder, RN, Thomasena Edis               General   Call Complete :   05/26/2020 10:58 EDT   Height/Length Estimated :   180.3 cm(Converted to: 70.98 in)    Weight   Estimated :   171.1 kg(Converted to: 377.211 lb)    Body Mass Index Estimated :   52.63 kg/m2   PAT Patient Procedure Verification :   Patient name and DOB confirmed with patient, Correct procedure scheduled confirmed with patient, Correct side/site confirmed with patient   Day of Proc Supp Prsn is the Emerg Cont :   Yes   Day of Procedure Support Person Name :   PATRICIA CONNOLLY   Day of Procedure Support Person Phone :   [phone]   Day of Procedure Support Person Relationship :   FIANCE   Languages :   English   Call Start :   05/26/2020 10:30 EDT     Information Given By :   Leonides Sake, RN, Thomasena Edis - 05/26/2020 10:30 EDT     Primary Care Physician/Specialists :   DR. Laurine Blazer  DR. MARK [facility]BRID[facility]   Tristan Schroeder, RN, Thomasena Edis - 05/23/2020 12:06 EDT   Allergies   (As Of: 05/27/2020 13:19:56 EDT)   Allergies (Active)   No Known Allergies  Estimated Onset Date:   Unspecified ; Created By:   Durene Cal, RN, DEBORAH L; Reaction Status:   Active ; Category:   Drug ; Substance:   No Known Allergies ; Type:   Allergy ; Updated By:   Reatha Armour; Reviewed Date:   05/27/2020 13:17 EDT        Medication History   Medication List   (As Of: 05/27/2020 13:19:56 EDT)   Normal Order    Lactated Ringers Injection solution 1,000 mL  :   Lactated Ringers Injection solution 1,000 mL ; Status:   Ordered ; Ordered As Mnemonic:   Lactated Ringers Injection 1,000 mL ; Simple Display Line:   40 mL/hr, IV ; Ordering Provider:   Willadean Carol L-PA; Catalog Code:   Lactated Ringers Injection ; Order Dt/Tm:   05/27/2020 13:03:02 EDT ; Comment:   Perioperative use ONLY  For Non Dialysis Patient          clindamycin  :    clindamycin ; Status:   Voided ; Ordered As Mnemonic:   clindamycin IVPB ; Simple Display Line:   900 mg, IV Piggyback, On Call ; Ordering Provider:   Willadean Carol L-PA; Catalog Code:   clindamycin ; Order Dt/Tm:   05/27/2020 13:03:02 EDT ; Comment:   If cefazolin-allergic without MRSA          ceFAZolin duplex  :   ceFAZolin duplex ; Status:   Ordered ; Ordered As Mnemonic:   ceFAZolin IVPB ; Simple Display Line:   2 g, 50 mL, 100 mL/hr, IV Piggyback, On Call ; Ordering Provider:   Willadean Carol L-PA; Catalog Code:   ceFAZolin ; Order Dt/Tm:   05/27/2020 13:03:02 EDT          sodium chloride 0.9% Inj Soln 10 mL syringe  :   sodium chloride 0.9% Inj Soln 10 mL syringe ; Status:   Ordered ;  Ordered As Mnemonic:   sodium chloride 0.9% flush syringe range dose ; Simple Display Line:   30 mL, IV Push, q68min, PRN: other (see comment) ; Ordering Provider:   Josiah Lobo A-MD; Catalog Code:   sodium chloride flush ; Order Dt/Tm:   05/27/2020 13:03:52 EDT          lidocaine 1% PF Inj Soln 2 mL  :   lidocaine 1% PF Inj Soln 2 mL ; Status:   Ordered ; Ordered As Mnemonic:   lidocaine 1% preservative-free injectable solution ; Simple Display Line:   0.25 mL, ID, q78min, PRN: other (see comment) ; Ordering Provider:   Josiah Lobo A-MD; Catalog Code:   lidocaine ; Order Dt/Tm:   05/27/2020 13:03:52 EDT ; Comment:   to access lidocaine 1%  2 mL vial for IV start and Life Port access            Prescription/Discharge Order    HYDROcodone-acetaminophen  :   HYDROcodone-acetaminophen ; Status:   Prescribed ; Ordered As Mnemonic:   Norco 325 mg-5 mg oral tablet ; Simple Display Line:   1-2 tabs, Oral, q6hr, PRN: severe pain (8-10), 20 tabs, 0 Refill(s) ; Ordering Provider:   Reynold Bowen S-PA; Catalog Code:   HYDROcodone-acetaminophen ; Order Dt/Tm:   05/27/2020 12:37:27 EDT          nalOXone  :   nalOXone ; Status:   Prescribed ; Ordered As Mnemonic:   Narcan 4 mg/0.1 mL nasal spray ; Simple Display Line:   4 mg, Nasal,  q77min,  every 2-3 minutes until patient responds or EMS arrives, PRN: overdose, 1 EA, 0 Refill(s) ; Ordering Provider:   Willadean Carol L-PA; Catalog Code:   nalOXone ; Order Dt/Tm:   08/30/2019 18:20:24 EDT            Home Meds    warfarin  :   warfarin ; Status:   Documented ; Ordered As Mnemonic:   warfarin 7.5 mg oral tablet ; Simple Display Line:   7.5 mg, 1 tabs, Oral, Once a Day (at bedtime), 30 tabs, 0 Refill(s) ; Catalog Code:   warfarin ; Order Dt/Tm:   05/26/2020 10:35:36 EDT          atorvastatin  :   atorvastatin ; Status:   Documented ; Ordered As Mnemonic:   atorvastatin 20 mg oral tablet ; Simple Display Line:   20 mg, 1 tabs, Oral, qAM, 0 Refill(s) ; Catalog Code:   atorvastatin ; Order Dt/Tm:   08/27/2019 16:51:49 EDT          gabapentin  :   gabapentin ; Status:   Documented ; Ordered As Mnemonic:   gabapentin 800 mg oral tablet ; Simple Display Line:   1,600 mg, 2 tabs, Oral, Once a Day (at bedtime), 0 Refill(s) ; Catalog Code:   gabapentin ; Order Dt/Tm:   08/27/2019 16:51:49 EDT          FLUoxetine  :   FLUoxetine ; Status:   Documented ; Ordered As Mnemonic:   FLUoxetine 40 mg oral capsule ; Simple Display Line:   80 mg, 2 caps, Oral, qAM, 0 Refill(s) ; Catalog Code:   FLUoxetine ; Order Dt/Tm:   08/27/2019 16:51:49 EDT          ARIPiprazole  :   ARIPiprazole ; Status:   Documented ; Ordered As Mnemonic:   ARIPiprazole 10 mg oral tablet ; Simple Display Line:   10  mg, 1 tabs, Oral, qAM, 0 Refill(s) ; Catalog Code:   ARIPiprazole ; Order Dt/Tm:   08/27/2019 16:51:49 EDT          albuterol  :   albuterol ; Status:   Documented ; Ordered As Mnemonic:   ProAir HFA 90 mcg/inh inhalation aerosol ; Simple Display Line:   1 puffs, Inhale, q6hr, PRN: shortness of breath or wheezing, 0 Refill(s) ; Catalog Code:   albuterol ; Order Dt/Tm:   08/27/2019 16:48:27 EDT          doxepin  :   doxepin ; Status:   Documented ; Ordered As Mnemonic:   doxepin 10 mg oral capsule ; Simple Display Line:   20 mg, 2 caps,  Oral, Once a Day (at bedtime), 0 Refill(s) ; Catalog Code:   doxepin ; Order Dt/Tm:   08/27/2019 16:51:49 EDT          HYDROcodone-acetaminophen  :   HYDROcodone-acetaminophen ; Status:   Discontinued ; Ordered As Mnemonic:   Norco 325 mg-5 mg oral tablet ; Simple Display Line:   1-2 tabs, Oral, q6hr, PRN: moderate pain (4-7), 30, 0 Refill(s) ; Ordering Provider:   Reynold Bowen S-PA; Catalog Code:   HYDROcodone-acetaminophen ; Order Dt/Tm:   05/26/2020 10:39:48 EDT            Problem History   (As Of: 05/27/2020 13:19:56 EDT)   Problems(Active)    Ankle pain, left (SNOMED CT  :785885027 )  Name of Problem:   Ankle pain, left ; Recorder:   PYLE, RN, JOYCE; Confirmation:   Confirmed ; Classification:   Patient Stated ; Code:   [id number] ; Contributor System:   PowerChart ; Last Updated:   06/27/2019 10:32 EDT ; Life Cycle Date:   06/27/2019 ; Life Cycle Status:   Active ; Vocabulary:   SNOMED CT        Anxiety (SNOMED CT  :67209470 )  Name of Problem:   Anxiety ; Recorder:   HUNTER, RN, DEBORAH L; Confirmation:   Confirmed ; Classification:   Patient Stated ; Code:   [id number] ; Contributor System:   PowerChart ; Last Updated:   05/30/2019 10:06 EDT ; Life Cycle Date:   05/30/2019 ; Life Cycle Status:   Active ; Vocabulary:   SNOMED CT        Bipolar affective (SNOMED CT  :94765465 )  Name of Problem:   Bipolar affective ; Recorder:   HUNTER, RN, DEBORAH L; Confirmation:   Confirmed ; Classification:   Patient Stated ; Code:   [id number] ; Contributor System:   PowerChart ; Last Updated:   05/30/2019 10:08 EDT ; Life Cycle Date:   05/30/2019 ; Life Cycle Status:   Active ; Vocabulary:   SNOMED CT        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 )  Name of Problem:   Contracture of ankle ; Recorder:   Tristan Schroeder, Charity fundraiser, Thomasena Edis; Confirmation:   Confirmed ; Classification:   Patient Stated ; Code:   749449675 ; Contributor System:   Dietitian ; Last Updated:   05/26/2020 10:42 EDT ; Life Cycle Date:   05/26/2020 ; Life Cycle Status:   Active ;  Vocabulary:   SNOMED CT        Depressive disorder (SNOMED CT  :91638466 )  Name of Problem:   Depressive disorder ; Recorder:   HUNTER, RN, DEBORAH L; Confirmation:   Confirmed ; Classification:   Patient Stated ; Code:  02725366 ; Contributor System:   Dietitian ; Last Updated:   05/30/2019 10:06 EDT ; Life Cycle Date:   05/30/2019 ; Life Cycle Status:   Active ; Vocabulary:   SNOMED CT        DJD (degenerative joint disease) (SNOMED CT  :4403474259 )  Name of Problem:   DJD (degenerative joint disease) ; Recorder:   PYLE, RN, JOYCE; Confirmation:   Confirmed ; Classification:   Patient Stated ; Code:   5638756433 ; Contributor System:   Dietitian ; Last Updated:   06/27/2019 10:31 EDT ; Life Cycle Date:   06/27/2019 ; Life Cycle Status:   Active ; Vocabulary:   SNOMED CT        Factor V Leiden (SNOMED CT  :295188416 )  Name of Problem:   Factor V Leiden ; Recorder:   HUNTER, RN, DEBORAH L; Confirmation:   Confirmed ; Classification:   Patient Stated ; Code:   606301601 ; Contributor System:   PowerChart ; Last Updated:   05/30/2019 10:08 EDT ; Life Cycle Date:   05/30/2019 ; Life Cycle Status:   Active ; Vocabulary:   SNOMED CT        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 )  Name of Problem:   Frontal lobe deficit ; Recorder:   PYLE, RN, JOYCE; Confirmation:   Confirmed ; Classification:   Patient Stated ; Code:   2025427062 ; Contributor System:   PowerChart ; Last Updated:   06/27/2019 10:32 EDT ; Life Cycle Date:   06/27/2019 ; Life Cycle Status:   Active ; Vocabulary:   SNOMED CT        H/O ankle fusion (SNOMED CT  :3762831517 )  Name of Problem:   H/O ankle fusion ; Recorder:   Tristan Schroeder, Charity fundraiser, Thomasena Edis; Confirmation:   Confirmed ; Classification:   Patient Stated ; Code:   6160737106 ; Contributor System:   Dietitian ; Last Updated:   05/26/2020 10:41 EDT ; Life Cycle Date:   05/26/2020 ; Life Cycle Status:   Active ; Vocabulary:   SNOMED CT        Hyperlipidemia (SNOMED CT  :26948546 )  Name of Problem:    Hyperlipidemia ; Recorder:   PYLE, RN, JOYCE; Confirmation:   Confirmed ; Classification:   Patient Stated ; Code:   27035009 ; Contributor System:   PowerChart ; Last Updated:   06/27/2019 10:31 EDT ; Life Cycle Date:   06/27/2019 ; Life Cycle Status:   Active ; Vocabulary:   SNOMED CT        IBS (irritable bowel syndrome) (SNOMED CT  :38182993 )  Name of Problem:   IBS (irritable bowel syndrome) ; Recorder:   HUNTER, RN, DEBORAH L; Confirmation:   Confirmed ; Classification:   Patient Stated ; Code:   71696789 ; Contributor System:   PowerChart ; Last Updated:   05/30/2019 10:06 EDT ; Life Cycle Date:   05/30/2019 ; Life Cycle Status:   Active ; Vocabulary:   SNOMED CT        Low back pain (SNOMED CT  :381017510 )  Name of Problem:   Low back pain ; Recorder:   HUNTER, RN, DEBORAH L; Confirmation:   Confirmed ; Classification:   Patient Stated ; Code:   258527782 ; Contributor System:   PowerChart ; Last Updated:   05/30/2019 10:05 EDT ; Life Cycle Date:   05/30/2019 ; Life Cycle Status:   Active ; Vocabulary:   SNOMED CT  OSA on CPAP (SNOMED CT  :660630160 )  Name of Problem:   OSA on CPAP ; Recorder:   Stokes, RN, Thomasena Edis; Confirmation:   Confirmed ; Classification:   Patient Stated ; Code:   109323557 ; Contributor System:   PowerChart ; Last Updated:   05/26/2020 10:41 EDT ; Life Cycle Status:   Active ; Vocabulary:   SNOMED CT   ; Comments:        05/26/2020 10:41 - Stokes, RN, Rebecca H  NIGHTLY CPAP USE.        Diagnoses(Active)    Painful orthopaedic hardware  Date:   05/27/2020 ; Diagnosis Type:   Discharge ; Confirmation:   Confirmed ; Clinical Dx:   Painful orthopaedic hardware ; Classification:   Medical ; Clinical Service:   Non-Specified ; Code:   ICD-10-CM ; Probability:   0 ; Diagnosis Code:   T84.84XA        Procedure History        -    Procedure History   (As Of: 05/27/2020 13:19:56 EDT)     Procedure Dt/Tm:   05/31/2019 09:33:00 EDT ; Location:   [facility] ; Provider:   Rondall Allegra W-MD;  Anesthesia Type:   Local ; Anesthesia Minutes:   0 ; Procedure Name:   Medial Branch Block Lumbar (Bilateral) ; Procedure Minutes:   6 ; Comments:     05/31/2019 9:39 EDT - Gertie Baron, RN, KAY A  auto-populated from documented surgical case ; Clinical Service:   Surgery ; Last Reviewed Dt/Tm:   05/27/2020 13:19:21 EDT            Anesthesia Minutes:   0 ; 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 ; Procedure Minutes:   0 ; Last Reviewed Dt/Tm:   05/27/2020 13:19:21 EDT            Procedure Dt/Tm:   12/26/2011 ; Anesthesia Minutes:   0 ; Procedure Name:   Colonoscopy ; Procedure Minutes:   0 ; Last Reviewed Dt/Tm:   05/27/2020 13:19:21 EDT            Anesthesia Minutes:   0 ; Procedure Name:   Vasectomy ; Procedure Minutes:   0 ; Last Reviewed Dt/Tm:   05/27/2020 13:19:21 EDT            Anesthesia Minutes:   0 ; Procedure Name:   Bilateral Arthroscopy of Knee ; Procedure Minutes:   0 ; Last Reviewed Dt/Tm:   05/27/2020 13:19:21 EDT            Anesthesia Minutes:   0 ; Procedure Name:   Left Hip Replacement x 2 ; Procedure Minutes:   0 ; Last Reviewed Dt/Tm:   05/27/2020 13:19:21 EDT            Anesthesia Minutes:   0 ; Procedure Name:   Left Ankle Fusion ; Procedure Minutes:   0 ; Comments:     05/30/2019 10:14 EDT - Durene Cal, RN, DEBORAH L  03/2017 ; Last Reviewed Dt/Tm:   05/27/2020 13:19:21 EDT            Procedure Dt/Tm:   07/02/2019 13:04:00 EDT ; Location:   [facility] ; Provider:   Rondall Allegra W-MD; Anesthesia Type:   Local ; Anesthesia Minutes:   0 ; Procedure Name:   Medial Branch Block Lumbar (Bilateral) ; Procedure Minutes:   11 ; Comments:     07/02/2019 13:20 EDT - Freida Busman,  RN, Sandria BalesKhali A  auto-populated from documented surgical case ; Clinical Service:   Surgery ; Last Reviewed Dt/Tm:   05/27/2020 13:19:21 EDT            Procedure Dt/Tm:   07/16/2019 11:38:00 EDT ; Location:   [facility] ; Provider:   Rondall AllegraANDALL,  DERRICK W-MD; Anesthesia Type:   Local ; Anesthesia Minutes:   0 ; Procedure Name:    Nerve Neurolysis [place] (Left) ; Procedure Minutes:   27 ; Comments:     07/16/2019 12:08 EDT - Freida BusmanAllen, RN, Sandria BalesKhali A  auto-populated from documented surgical case ; Clinical Service:   Surgery ; Last Reviewed Dt/Tm:   05/27/2020 13:19:21 EDT            Procedure Dt/Tm:   08/30/2019 15:53:00 EDT ; Location:   [facility] ; Provider:   Tommy RainwaterLAMB,  JOSHUA H-MD; Anesthesia Type:   General ; Quentin Angst:   ILIC,  ROMINA G-MD; Anesthesia Minutes:   0 ; Procedure Name:   Ankle and/or Hind Foot Hardware Removal (Left) ; Procedure Minutes:   126 ; Comments:     08/30/2019 18:04 EDT - Toni ArthursFuller, RN, Kyrin  auto-populated from documented surgical case ; Clinical Service:   Surgery ; Last Reviewed Dt/Tm:   05/27/2020 13:19:21 EDT            Procedure Dt/Tm:   08/30/2019 15:53:00 EDT ; Location:   [facility] ; Provider:   Tommy RainwaterLAMB,  JOSHUA H-MD; Anesthesia Type:   General ; Quentin Angst:   ILIC,  ROMINA G-MD; Anesthesia Minutes:   0 ; Procedure Name:   Ankle Arthrodesis SCIP (Left) ; Procedure Minutes:   126 ; Comments:     08/30/2019 18:04 EDT - Toni ArthursFuller, RN, Kyrin  auto-populated from documented surgical case ; Clinical Service:   Surgery ; Last Reviewed Dt/Tm:   05/27/2020 13:19:21 EDT            Procedure Dt/Tm:   08/30/2019 15:53:00 EDT ; Location:   [facility] ; Provider:   Tommy RainwaterLAMB,  JOSHUA H-MD; Anesthesia Type:   General ; Quentin Angst:   ILIC,  ROMINA G-MD; Anesthesia Minutes:   0 ; Procedure Name:   Bone Marrow Aspiration With or Without Autologous Injection (Tibial, Left) ; Procedure Minutes:   126 ; Comments:     08/30/2019 18:04 EDT - Toni ArthursFuller, RN, Kyrin  auto-populated from documented surgical case ; Clinical Service:   Surgery ; Last Reviewed Dt/Tm:   05/27/2020 13:19:21 EDT            History Confirmation   Problem History Changes PAT :   No   Procedure History Changes PAT :   No   KEMP, RN, JUDY M - 05/27/2020 13:16 EDT   Anesthesia/Sedation   Anesthesia History :   Prior general anesthesia   SN - Malignant Hyperthermia :   Denies   Previous Problem with Anesthesia :   None   Moderate Sedation History :    Prior sedation for procedure   Previous Problem With Sedation :   None   Shortness of Breath Indicator :   No shortness of breath   Pregnancy Status :   N/A   Tristan SchroederStokes, RThomasena Edis, Rebecca H - 05/26/2020 10:30 EDT   Bloodless Medicine   Is Blood Transfusion Acceptable to Patient :   Yes   Tristan SchroederStokes, RN, Thomasena EdisRebecca H - 05/26/2020 10:30 EDT   ID Risk Screen Symptoms   Recent Travel History :   No recent travel  TB Symptom Screen :   No symptoms   Last 90 days COVID-19 ID :   No   Close Contact with COVID-19 ID :   No   Last 14 days COVID-19 ID :   No   Stokes, RN, Thomasena Edis - 05/26/2020 10:30 EDT   Social History   Social History   (As Of: 05/27/2020 13:19:56 EDT)   Tobacco:        Tobacco use: 10 or more cigarettes (1/2 pack or more)/day in last 30 days.  Cigarettes   (Last Updated: 05/26/2020 10:45:16 EDT by Tristan Schroeder, RN, Thomasena Edis)          Electronic Cigarette/Vaping:        Never Electronic Cigarette Use.   (Last Updated: 05/26/2020 10:44:41 EDT by Tristan Schroeder, RN, Thomasena Edis)          Alcohol:        Current, 1-2 times per month   (Last Updated: 08/27/2019 16:56:57 EDT by Ashok Cordia, RN, CATHERINE)          Substance Use:        Opioid Naive - not currently taking opioids, Denies   (Last Updated: 08/27/2019 16:57:21 EDT by Ashok Cordia, RN, CATHERINE)   PRESCRIBED NORCO ON 05/21/2020 S/P DENTAL PROCEDURE.   (Last Updated: 05/26/2020 10:47:22 EDT by Tristan Schroeder, RN, Thomasena Edis)            Advance Directive   Location of Advance Directive :   Unable to obtain copy   KEMP, RN, JUDY M - 05/27/2020 13:16 EDT   Advance Directive :   Yes   Type of Advance Directive :   Living will   Pia Mau - 05/26/2020 10:30 EDT   PAT/Clinic Comments   Additional Comments PAT :   5/2 - VERIFIED VACCINATION. INR TO TBD ON ADM DOS.     Stokes, RN, Thomasena Edis - 05/26/2020 10:30 EDT   Harm Screen   Feels Safe Where Live :   Yes   Last 3 mo, thoughts killing self/others :   Patient denies   KEMP, RN, JUDY M - 05/27/2020 13:16 EDT

## 2020-05-27 LAB — PROTIME-INR
INR: 1.2 — ABNORMAL LOW (ref 1.5–3.5)
Protime: 15.2 seconds — ABNORMAL HIGH (ref 11.6–14.5)

## 2020-05-27 NOTE — Discharge Summary (Signed)
Inpatient Clinical Summary             [facility]  Post-Acute Care Transfer Instructions  PERSON INFORMATION   Name: Dylan Blankenship, Dylan Blankenship   MRN: [id number]    FIN#: [id number]   PHYSICIANS  Admitting Physician: Tommy Rainwater H-MD  Attending Physician: Tommy Rainwater H-MD   PCP: Cristina Gong DAVID-MD  Discharge Diagnosis: Painful orthopaedic hardware  Comment:       PATIENT EDUCATION INFORMATION  Instructions:               Medication Leaflets:               Follow-up:                           With: Address: When:   F/U WITH Catarina Huntley, PA-C OR ALEX MILLER, PA-C 14 DAYS POST OP, CALL OFFICE FOR APPT/WITH QUESTIONS OR CONCERNS. [phone].         With: Address: When:   Cristina Gong [address] [place], [place] [address]  [phone] Business (1)                              MEDICATION LIST  Medication Reconciliation at Discharge:          Medications That Were Updated - Follow Current Instructions  Printed Prescriptions  Current: HYDROcodone-acetaminophen (Norco 325 mg-5 mg oral tablet) 1-2 tabs Oral (given by mouth) every 6 hours as needed severe pain (8-10). Refills: 0.  Last Dose:____________________    Medications That Have Not Changed  Other Medications  albuterol (ProAir HFA 90 mcg/inh inhalation aerosol) 1 Puffs Inhale (breathe in) every 6 hours as needed shortness of breath or wheezing.  Last Dose:____________________  ARIPiprazole (ARIPiprazole 10 mg oral tablet) 1 Tabs Oral (given by mouth) once a day (in the morning).  Last Dose:____________________  atorvastatin (atorvastatin 20 mg oral tablet) 1 Tabs Oral (given by mouth) once a day (in the morning).  Last Dose:____________________  doxepin (doxepin 10 mg oral capsule) 2 Capsules Oral (given by mouth) Once a Day (at bedtime).  Last Dose:____________________  FLUoxetine (FLUoxetine 40 mg oral capsule) 2 Capsules Oral (given by mouth) once a day (in the morning).  Last Dose:____________________  gabapentin (gabapentin 800 mg oral tablet) 2 Tabs  Oral (given by mouth) Once a Day (at bedtime).  Last Dose:____________________  nalOXone (Narcan 4 mg/0.1 mL nasal spray) 4 Milligram Nasal (into the nose) every 2 minutes as needed overdose. 4mg  every 2-3 minutes until patient responds or EMS arrives. Refills: 0.  Last Dose:____________________  warfarin (warfarin 7.5 mg oral tablet) 1 Tabs Oral (given by mouth) Once a Day (at bedtime).  Last Dose:____________________         Patient???s Final Home Medication List Upon Discharge:           albuterol (ProAir HFA 90 mcg/inh inhalation aerosol) 1 Puffs Inhale (breathe in) every 6 hours as needed shortness of breath or wheezing.  ARIPiprazole (ARIPiprazole 10 mg oral tablet) 1 Tabs Oral (given by mouth) once a day (in the morning).  atorvastatin (atorvastatin 20 mg oral tablet) 1 Tabs Oral (given by mouth) once a day (in the morning).  doxepin (doxepin 10 mg oral capsule) 2 Capsules Oral (given by mouth) Once a Day (at bedtime).  FLUoxetine (FLUoxetine 40 mg oral capsule) 2 Capsules Oral (given by mouth) once a day (in the morning).  gabapentin (gabapentin 800 mg oral tablet) 2 Tabs Oral (given by mouth) Once a Day (at bedtime).  HYDROcodone-acetaminophen (Norco 325 mg-5 mg oral tablet) 1-2 tabs Oral (given by mouth) every 6 hours as needed severe pain (8-10). Refills: 0.  nalOXone (Narcan 4 mg/0.1 mL nasal spray) 4 Milligram Nasal (into the nose) every 2 minutes as needed overdose. 4mg  every 2-3 minutes until patient responds or EMS arrives. Refills: 0.  warfarin (warfarin 7.5 mg oral tablet) 1 Tabs Oral (given by mouth) Once a Day (at bedtime).         Comment:       ORDERS          Order Name Order Details

## 2020-05-27 NOTE — Anesthesia Pre-Procedure Evaluation (Signed)
Preanesthesia Evaluation        Patient:   Dylan Blankenship, Dylan Blankenship             MRN: [id number]            FIN: [phone]               Age:   60 years     Sex:  Male     DOB:  12-01-1960   Associated Diagnoses:   None   Author:   Gillis Ends K-MD      Preoperative Information   Procedure/ Case: hardware removal left foot   NPO:  NPO greater than 8 hours.    Anesthesia history     Patient's history: negative.     Family's history: negative.        Review of Systems   Integumentary:  Deferred.    Ear/Nose/Mouth/Throat:  Negative.    Respiratory:       Sleep apnea: CPAP.    Cardiovascular:  Negative.    Vascular:  Deferred.    Gastrointestinal:  Deferred.    Genitourinary:  Deferred.    Endocrine:  Deferred.    Musculoskeletal:  Deferred.    Neurologic:  Deferred.       Health Status   Allergies:    Allergic Reactions (Selected)  No Known Allergies   Current medications:    Home Medications (9) Active  ARIPiprazole 10 mg oral tablet 10 mg = 1 tabs, Oral, qAM  atorvastatin 20 mg oral tablet 20 mg = 1 tabs, Oral, qAM  doxepin 10 mg oral capsule 20 mg = 2 caps, Oral, Once a Day (at bedtime)  FLUoxetine 40 mg oral capsule 80 mg = 2 caps, Oral, qAM  gabapentin 800 mg oral tablet 1,600 mg = 2 tabs, Oral, Once a Day (at bedtime)  Narcan 4 mg/0.1 mL nasal spray 4 mg, PRN, Nasal, q67min  Norco 325 mg-5 mg oral tablet 1-2 tabs, PRN, Oral, q6hr  ProAir HFA 90 mcg/inh inhalation aerosol 1 puffs, PRN, Inhale, q6hr  warfarin 7.5 mg oral tablet 7.5 mg = 1 tabs, Oral, Once a Day (at bedtime)  ,    Medications (4) Active  Scheduled: (1)  ceFAZolin 3g/187mL NS pre-made  3 g 100 mL, IV Piggyback, On Call  Continuous: (1)  Lactated Ringers Injection solution 1,000 mL  1,000 mL, IV, 40 mL/hr  PRN: (2)  lidocaine 1% PF Inj Soln 2 mL  0.25 mL, ID, q59min  sodium chloride 0.9% Inj Soln 10 mL syringe  30 mL, IV Push, q32min     Problem list:    Active Problems (13)  Ankle pain, left   Anxiety   Bipolar affective   Contracture of ankle   Depressive  disorder   DJD (degenerative joint disease)   Factor V Leiden   Frontal lobe deficit   H/O ankle fusion   Hyperlipidemia   IBS (irritable bowel syndrome)   Low back pain   OSA on CPAP         Histories   Past Medical History:    No active or resolved past medical history items have been selected or recorded.   Procedure history:    Ankle and/or Hind Foot Hardware Removal (Left) on 08/30/2019 at 59 Years.  Comments:  08/30/2019 18:04 EDT - Toni Arthurs, RN, Kyrin  auto-populated from documented surgical case  Ankle Arthrodesis SCIP (Left) on 08/30/2019 at 59 Years.  Comments:  08/30/2019 18:04 EDT - Toni Arthurs, RN, Kyrin  auto-populated from  documented surgical case  Bone Marrow Aspiration With or Without Autologous Injection (Tibial, Left) on 08/30/2019 at 59 Years.  Comments:  08/30/2019 18:04 EDT - Toni Arthurs, RN, Kyrin  auto-populated from documented surgical case  Nerve Neurolysis [place] (Left) on 07/16/2019 at 59 Years.  Comments:  07/16/2019 12:08 EDT - Freida Busman, RN, Sandria Bales A  auto-populated from documented surgical case  Medial Branch Block Lumbar (Bilateral) on 07/02/2019 at 59 Years.  Comments:  07/02/2019 13:20 EDT - Freida Busman, RN, Sandria Bales A  auto-populated from documented surgical case  Medial Branch Block Lumbar (Bilateral) on 05/31/2019 at 58 Years.  Comments:  05/31/2019 9:39 EDT - Gertie Baron, RN, KAY A  auto-populated from documented surgical case  Colonoscopy on 12/26/2011 at 51 Years.  Vasectomy ([id number]).  Left Hip Replacement x 2.  Bilateral Arthroscopy of Knee.  Left Ankle Fusion.  Comments:  05/30/2019 10:14 EDT - Durene Cal, RN, DEBORAH L  03/2017  Mega Colon Secondary Hirschprung's Disease.   Social History        Social & Psychosocial Habits    Alcohol  08/27/2019  Use: Current    Frequency: 1-2 times per month    Substance Use  08/27/2019  Opioid Assessment Opioid Naive-not taking    Use: Denies    05/26/2020  Opioid Assessment PRESCRIBED NORCO ON 05/21/2020 S/P DENTAL PROCEDURE.    Tobacco  05/26/2020  Use: 10 or  more cigarettes (1/    Type: Cigarettes    Electronic Cigarette/Vaping  05/26/2020  Electronic Cigarette Use: Never  .     Symptoms of Sleep Apnea Score: PAT Documentation: 5                        08/27/2019 16:43 EDT        Physical Examination   Vital Signs   05/27/2020 13:10 EDT Systolic Blood Pressure 110 mmHg    Diastolic Blood Pressure 67 mmHg    Temperature Oral 36.9 degC    Heart Rate Monitored 67 bpm    Respiratory Rate 20 br/min    SpO2 96 %         Vital Signs (last 24 hrs)_____  Last Charted___________  Temp Oral     36.9 degC  (MAY 03 13:10)  Resp Rate         20 br/min  (MAY 03 13:10)  SBP      110 mmHg  (MAY 03 13:10)  DBP      67 mmHg  (MAY 03 13:10)  SpO2      96 %  (MAY 03 13:10)  BMI      51.65  (MAY 03 13:11)     Measurements from flowsheet : Measurements   05/27/2020 13:11 EDT Body Mass Index est meas 51.65 kg/m2    Body Mass Index Measured 51.65 kg/m2   05/27/2020 13:10 EDT Patient Stated Height/Length 180 cm    Weight Dosing 167.9 kg      Pain assessment:  Pain Assessment   05/27/2020 13:10 EDT Numeric Rating Pain Scale 5 = Moderate pain    Numeric Pain Rating Comfort Function Goal 4    Primary Pain Location Ankle    Primary Pain Laterality Left      .    General:          Stress: No acute distress.         Appearance: Morbidly obese.    Airway:          Mallampati classification: III (soft palate, base  of uvula visible).         Thyromental Distance: Decreased.         Mouth: Teeth ( Within normal limits ).    Neck:  Limited range of motion, Increased girth.    Respiratory:  unlabored breathing.    Cardiovascular:  Normal rate, Regular rhythm.    Gastrointestinal:  Deferred.    Musculoskeletal     Deferred.     Neurologic:  Alert, Oriented.    Metabolic Equivalents in Exercise Testing:  4-7.       Review / Management   Results review:     Labs (Last four charted values)  PT                   H 15.2 (MAY 03)   INR                  L 1.2 (MAY 03) , Lab results   05/27/2020 13:50 EDT PT 15.2 seconds  HI     INR 1.2  LOW   .       Assessment and Plan   American Society of Anesthesiologists#(ASA) physical status classification:  Class III.    Anesthetic Preoperative Plan     Anesthetic technique: General anesthesia.     Maintenance airway: Laryngeal mask airway.     Risks discussed: nausea, vomiting, sore throat, serious complications.     Signature Line     Electronically Signed on 05/27/2020 03:34 PM EDT   ________________________________________________   Gillis Ends K-MD

## 2020-05-27 NOTE — Discharge Summary (Signed)
Inpatient Patient Summary              [facility] Emergency Department  [address] [place] [address]  [phone]  Discharge Instructions (Patient)  Name: Dylan Blankenship, Dylan Blankenship  DOB:  [birth year]                   MRN: [id number]                   FIN: [phone]  Reason For Visit: Z98.1/R93.3/M24.573  Final Diagnosis: Painful orthopaedic hardware     Visit Date: 05/01/2020 14:36:02  Address: [address] [place] [address]  Phone: [phone]     Emergency Department Providers:         Primary Physician:      None         [facility] would like to thank you for allowing [place] to assist you with your healthcare needs. The following includes patient education materials and information regarding your injury/illness.     Follow-up Instructions:  You were seen today on an emergency basis. Please contact your primary care doctor for a follow up appointment. If you received a referral to a specialist doctor, it is important you follow-up as instructed.    It is important that you call your follow-up doctor to schedule and confirm the location of your next appointment. Your doctor may practice at multiple locations. The office location of your follow-up appointment may be different to the one written on your discharge instructions.    If you do not have a primary care doctor, please call [phone] for help in finding a [facility] Provider. For help in finding a specialist doctor, please call [phone].    If your condition gets worse before your follow-up with your primary care doctor or specialist, please return to the Emergency Department.      Coronavirus 2019 (COVID-19) Reminders:     Patients age 75 - 88, with parental consent, and patients over age 90 can make an appointment for a COVID-19 vaccine. Patients can contact their [facility] doctors' offices to schedule an appointment to receive the COVID-19 vaccine. Patients who do not have a [facility] physician can call [phone] to schedule vaccination appointments.      Follow Up Appointments:  Primary Care Provider:     Name: Cristina Gong DAVID-MD     Phone: [phone]                 With: Address: When:   F/U WITH Arelene Moroni, PA-C OR ALEX MILLER, PA-C 14 DAYS POST OP, CALL OFFICE FOR APPT/WITH QUESTIONS OR CONCERNS. [phone].         With: Address: When:   Cristina Gong [address] [place], [place] [address]  [phone] Business (1)               [facility] SERVICES%>  Discharge Service Agency Selected  DISCHARGE SERVICE AGENCY SELECTED%>     DME Agency  DME AGENCY%>             Medications That Were Updated - Follow Current Instructions  Printed Prescriptions  Current: HYDROcodone-acetaminophen (Norco 325 mg-5 mg oral tablet) 1-2 tabs Oral (given by mouth) every 6 hours as needed severe pain (8-10). Refills: 0.  Last Dose:____________________    Medications That Have Not Changed  Other Medications  albuterol (ProAir HFA 90 mcg/inh inhalation aerosol)  1 Puffs Inhale (breathe in) every 6 hours as needed shortness of breath or wheezing.  Last Dose:____________________  ARIPiprazole (ARIPiprazole 10 mg oral tablet) 1 Tabs Oral (given by mouth) once a day (in the morning).  Last Dose:____________________  atorvastatin (atorvastatin 20 mg oral tablet) 1 Tabs Oral (given by mouth) once a day (in the morning).  Last Dose:____________________  doxepin (doxepin 10 mg oral capsule) 2 Capsules Oral (given by mouth) Once a Day (at bedtime).  Last Dose:____________________  FLUoxetine (FLUoxetine 40 mg oral capsule) 2 Capsules Oral (given by mouth) once a day (in the morning).  Last Dose:____________________  gabapentin (gabapentin 800 mg oral tablet) 2 Tabs Oral (given by mouth) Once a Day (at bedtime).  Last Dose:____________________  nalOXone (Narcan 4 mg/0.1 mL nasal spray) 4 Milligram Nasal (into the nose) every 2 minutes as needed overdose. 4mg  every 2-3  minutes until patient responds or EMS arrives. Refills: 0.  Last Dose:____________________  warfarin (warfarin 7.5 mg oral tablet) 1 Tabs Oral (given by mouth) Once a Day (at bedtime).  Last Dose:____________________      Allergy Info: No Known Allergies     Discharge Additional Information    Patient Education Materials:       ---------------------------------------------------------------------------------------------------------------------  [facility] allows patients to review your COVID and other test results as well as discharge documents from any [facility]. [facility], Emergency Department, surgical center or outpatient lab. Test results are typically available 36 hours after the test is completed.     [facility] encourages you to self-enroll in the [facility] Patient Portal.     To begin your self-enrollment process, please visit [facility]. Under [facility], click on ???Sign up now???.     NOTE: You must be 16 years and older to use [facility] Self-Enroll online. If you are a parent, caregiver, or guardian; you need an invite to access your child???s or dependent???s health records. To obtain an invite, contact the Medical Records department at [phone] Monday through Friday, 8-4:30, select option 3 . If we receive your call afterhours, we will return your call the next business day.     If you have issues trying to create or access your account, contact [employer] support at [phone] available 7 days a week 24 hours a day.     Comment:

## 2020-05-27 NOTE — Op Note (Signed)
Phase II Record - [facility]             Phase II Record - [facility] Summary                                                                  Primary Physician:        Tommy Rainwater H-MD    Case Number:              [id number]    Finalized Date/Time:      05/27/20 17:34:08    Pt. Name:                 Dylan Blankenship, Dylan Blankenship    D.O.B./Sex:               03-06-1960    Male    Med Rec #:                [id number]    Physician:                Tommy Rainwater H-MD    Financial #:              [id number]    Pt. Type:                 S    Room/Bed:                 /    Admit/Disch:              05/27/20 12:28:00 -    Institution:       [facility] Case Attendance - Phase II                                                                                           Entry 1                         Entry 2                                                                          Case Attendee             LAMB,  JOSHUA H-MD              Gillet, RN, Frederik Schmidt    Role Performed            Surgeon Primary                 Post Anesthesia Care  Nurse    Time In     Time Out     Last Modified By:         Dorita Fray RN, Ezzie Dural, RNFrederik Schmidt                              05/27/20 16:53:55               05/27/20 17:07:26      [facility] Case Attendance - Phase II Audit                                                            05/27/20 17:07:26         Owner: Colin Mulders                               Modifier: GILLCO                                                        <+> 2         Case Attendee        <+> 2         Role Performed        [facility] - Case Times - Phase II                                                                                              Entry 1                                                                                                          Phase II In               05/27/20 17:03:00               Phase II Out                    05/27/20 17:34:00    Phase II Discharge         05/27/20 17:34:00    Time     Last Modified By:         Dorita Fray, RN,  Frederik Schmidt                              05/27/20 17:34:07      RHOR - Case Times - Phase II Audit                                                               05/27/20 17:34:07         Owner: Colin Mulders                               Modifier: GILLCO                                                        <+> 1         Phase II Out        <+> 1         Phase II Discharge Time                Finalized By: Morey Hummingbird      Document Signatures                                                                             Signed By:           Morey Hummingbird 05/27/20 17:34

## 2020-05-27 NOTE — Op Note (Signed)
Phase I Record - [facility]             Phase I Record - RHOR Summary                                                                   Primary Physician:        Tommy Rainwater H-MD    Case Number:              [id number]    Finalized Date/Time:      05/27/20 17:07:11    Pt. Name:                 Dylan Blankenship, Dylan Blankenship    D.O.B./Sex:               04-Jan-1961    Male    Med Rec #:                [id number]    Physician:                Tommy Rainwater H-MD    Financial #:              [id number]    Pt. Type:                 S    Room/Bed:                 /    Admit/Disch:              05/27/20 12:28:00 -    Institution:       [facility] Case Attendance - Phase I                                                                                            Entry 1                                                                                                          Case Attendee             Dorita Fray, RN, Frederik Schmidt          Role Performed                  Post Anesthesia Care  Nurse    Last Modified By:         Dorita Fray, RN, Frederik Schmidt                              05/27/20 16:54:04      [facility] - Case Times - Phase I                                                                                               Entry 1                                                                                                          Phase I In                05/27/20 16:33:00               Phase I Out                     05/27/20 17:03:00    Phase I Discharge         05/27/20 17:03:00    Time     Last Modified By:         Dorita Fray RN, Frederik Schmidt                              05/27/20 17:07:11              Finalized By: Dorita Fray RN, Frederik Schmidt      Document Signatures                                                                             Signed By:           Dorita Fray RN, Frederik Schmidt 05/27/20 17:07

## 2020-05-27 NOTE — Anesthesia Pre-Procedure Evaluation (Signed)
Preanesthesia Evaluation        Patient:   Dylan Blankenship, Dylan Blankenship             MRN: [id number]            FIN: [phone]               Age:   60 years     Sex:  Male     DOB:  Aug 11, 1960   Associated Diagnoses:   None   Author:   Ellison Hughs      Preoperative Information   NPO:  NPO greater than 8 hours.    Anesthesia history     Patient's history: negative.     Family's history: negative.        History of Present Illness   The patient presents for preanesthesia evaluation with L ankle painful hardware.        Review of Systems   Integumentary:  Negative.    Ear/Nose/Mouth/Throat:  Negative.    Respiratory:  Sleep apnea.    Cardiovascular:  Negative.    Vascular:  Negative.    Gastrointestinal:  Negative.    Musculoskeletal:  Joint pain.    Neurologic:  Mood disorder.    Oncology/Hematology:  Bleeding disorder.       Health Status   Allergies:    Allergic Reactions (Selected)  No Known Allergies,    Allergies    (Active and Proposed Allergies Only)  No Known Allergies   (Severity: Unknown severity, Onset: Unknown)     Current medications:    Home Medications (9) Active  ARIPiprazole 10 mg oral tablet 10 mg = 1 tabs, Oral, qAM  atorvastatin 20 mg oral tablet 20 mg = 1 tabs, Oral, qAM  doxepin 10 mg oral capsule 20 mg = 2 caps, Oral, Once a Day (at bedtime)  FLUoxetine 40 mg oral capsule 80 mg = 2 caps, Oral, qAM  gabapentin 800 mg oral tablet 1,600 mg = 2 tabs, Oral, Once a Day (at bedtime)  Narcan 4 mg/0.1 mL nasal spray 4 mg, PRN, Nasal, q5min  Norco 325 mg-5 mg oral tablet 1-2 tabs, PRN, Oral, q6hr  ProAir HFA 90 mcg/inh inhalation aerosol 1 puffs, PRN, Inhale, q6hr  warfarin 7.5 mg oral tablet 7.5 mg = 1 tabs, Oral, Once a Day (at bedtime)  ,    Medications (4) Active  Scheduled: (1)  ceFAZolin 3g/167mL NS pre-made  3 g 100 mL, IV Piggyback, On Call  Continuous: (1)  Lactated Ringers Injection solution 1,000 mL  1,000 mL, IV, 40 mL/hr  PRN: (2)  lidocaine 1% PF Inj Soln 2 mL  0.25 mL, ID, q8min  sodium chloride 0.9% Inj  Soln 10 mL syringe  30 mL, IV Push, q54min     Problem list:    Active Problems (13)  Ankle pain, left   Anxiety   Bipolar affective   Contracture of ankle   Depressive disorder   DJD (degenerative joint disease)   Factor V Leiden   Frontal lobe deficit   H/O ankle fusion   Hyperlipidemia   IBS (irritable bowel syndrome)   Low back pain   OSA on CPAP   ,    Problems   (Active Problems Only)    Low back pain   (SNOMED CT: 856314970, Onset: --)  Depressive disorder   (SNOMED CT: 26378588, Onset: --)  Anxiety   (SNOMED CT: 50277412, Onset: --)  Irritable bowel syndrome   (SNOMED CT: 87867672, Onset: --)  Bipolar  disorder   (SNOMED CT: 16109604, Onset: --)  Factor V Leiden mutation   (SNOMED CT: 540981191, Onset: --)  Hyperlipidemia   (SNOMED CT: 47829562, Onset: --)  Osteoarthritis   (SNOMED CT: 1308657846, Onset: --)  Impaired executive functioning   (SNOMED CT: 9629528413, Onset: --)  Ankle pain   (SNOMED CT: 244010272, Onset: --)  Obstructive sleep apnea syndrome   (SNOMED CT: 536644034, Onset: --)   Comment:  NIGHTLY CPAP USE.  Tristan Schroeder, RN, Thomasena Edis on 05/26/20)   History of arthrodesis of ankle   (SNOMED CT: 7425956387, Onset: --)  Contracture of ankle joint   (SNOMED CT: 564332951, Onset: --)        Histories   Past Medical History:    No active or resolved past medical history items have been selected or recorded.   Procedure history:    Ankle and/or Hind Foot Hardware Removal (Left) performed by LAMB,  JOSHUA H-MD on 08/30/2019 at 59 Years.  Comments:  08/30/2019 18:04 EDT - Toni Arthurs, RN, Kyrin  auto-populated from documented surgical case  Ankle Arthrodesis SCIP (Left) performed by LAMB,  JOSHUA H-MD on 08/30/2019 at 59 Years.  Comments:  08/30/2019 18:04 EDT - Toni Arthurs, RN, Kyrin  auto-populated from documented surgical case  Bone Marrow Aspiration With or Without Autologous Injection (Tibial, Left) performed by LAMB,  JOSHUA H-MD on 08/30/2019 at 59 Years.  Comments:  08/30/2019 18:04 EDT - Toni Arthurs, RN, Kyrin  auto-populated  from documented surgical case  Nerve Neurolysis [place] (Left) performed by Rondall Allegra W-MD on 07/16/2019 at 59 Years.  Comments:  07/16/2019 12:08 EDT - Freida Busman, RN, Sandria Bales A  auto-populated from documented surgical case  Medial Branch Block Lumbar (Bilateral) performed by Rondall Allegra W-MD on 07/02/2019 at 59 Years.  Comments:  07/02/2019 13:20 EDT - Freida Busman, RN, Sandria Bales A  auto-populated from documented surgical case  Medial Branch Block Lumbar (Bilateral) performed by Rondall Allegra W-MD on 05/31/2019 at 58 Years.  Comments:  05/31/2019 9:39 EDT - Gertie Baron, RN, KAY A  auto-populated from documented surgical case  Colonoscopy on 12/26/2011 at 51 Years.  Vasectomy (SNOMED CT [phone]).  Left Hip Replacement x 2.  Bilateral Arthroscopy of Knee.  Left Ankle Fusion.  Comments:  05/30/2019 10:14 EDT - Durene Cal, RN, DEBORAH L  03/2017  Mega Colon Secondary Hirschprung's Disease.   Social History        Social & Psychosocial Habits    Alcohol  08/27/2019  Use: Current    Frequency: 1-2 times per month    Substance Use  08/27/2019  Opioid Assessment Opioid Naive-not taking    Use: Denies    05/26/2020  Opioid Assessment PRESCRIBED NORCO ON 05/21/2020 S/P DENTAL PROCEDURE.    Tobacco  05/26/2020  Use: 10 or more cigarettes (1/    Type: Cigarettes    Electronic Cigarette/Vaping  05/26/2020  Electronic Cigarette Use: Never  .     Symptoms of Sleep Apnea Score: PAT Documentation: 5                        08/27/2019 16:43 EDT        Physical Examination   Vital Signs   05/27/2020 13:10 EDT Systolic Blood Pressure 110 mmHg    Diastolic Blood Pressure 67 mmHg    Temperature Oral 36.9 degC    Heart Rate Monitored 67 bpm    Respiratory Rate 20 br/min    SpO2 96 %  Vital Signs (last 24 hrs)_____  Last Charted___________  Temp Oral     36.9 degC  (MAY 03 13:10)  Resp Rate         20 br/min  (MAY 03 13:10)  SBP      110 mmHg  (MAY 03 13:10)  DBP      67 mmHg  (MAY 03 13:10)  SpO2      96 %  (MAY 03  13:10)  BMI      51.65  (MAY 03 13:11)     Measurements from flowsheet : Measurements   05/27/2020 13:11 EDT Body Mass Index est meas 51.65 kg/m2    Body Mass Index Measured 51.65 kg/m2   05/27/2020 13:10 EDT Patient Stated Height/Length 180 cm    Weight Dosing 167.9 kg      Pain assessment:  Pain Assessment   05/27/2020 13:10 EDT Numeric Rating Pain Scale 5 = Moderate pain    Numeric Pain Rating Comfort Function Goal 4    Primary Pain Location Ankle    Primary Pain Laterality Left      .    General:          Stress: No acute distress.         Appearance: Morbidly obese.    Airway:          Mallampati classification: II (soft palate, fauces, uvula visible).         Thyromental Distance: Normal.         Mouth: Teeth ( Within normal limits ).         Throat: Within normal limits.    Head:  Normocephalic, Atraumatic.    Neck:  Full range of motion.    Respiratory:  Lungs are clear to auscultation, Breath sounds are equal.    Cardiovascular:  Normal rate, No murmur, Regular rhythm.    Neurologic:  Alert, Oriented.       Review / Management   Results review:     No qualifying data available.       Assessment and Plan   American Society of Anesthesiologists#(ASA) physical status classification:  Class III.    Anesthetic Preoperative Plan     Anesthetic technique: General anesthesia, Intravenous.     Opioid Assessment: Opioid Na????ve.     Risks discussed: nausea, vomiting, headache, sore throat, dental injury, hypotension, allergic reaction, serious complications.     Notes: Alternatives, risks of anesthesia, and potential complications were discussed. Patient and/or guardian state understanding and acceptance of anesthesia plan.Veterinary surgeon Line     Electronically Signed on 05/27/2020 02:00 PM EDT   ________________________________________________   Ellison Hughs

## 2020-05-27 NOTE — Nursing Note (Signed)
Nursing Discharge Summary - Text       Nursing Discharge Summary Entered On:  05/27/2020 17:01 EDT    Performed On:  05/27/2020 17:01 EDT by Dorita Fray, RN, Frederik Schmidt               DC Information   Discharge To :   Home independently, Family support   Mode of Discharge :   Wheelchair   Transportation :   Private vehicle   [place], [place], Frederik Schmidt - 05/27/2020 17:01 EDT

## 2020-05-27 NOTE — Procedures (Signed)
IntraOp Record - [facility]             IntraOp Record - [facility] Summary                                                                   Primary Physician:        Bluford Main H-MD    Case Number:              [id number]    Finalized Date/Time:      05/27/20 16:41:56    Pt. Name:                 Dylan Blankenship, Dylan Blankenship    D.O.B./Sex:               07-Feb-1960    Male    Med Rec #:                [id number]    Physician:                Bluford Main H-MD    Financial #:              [id number]    Pt. Type:                 S    Room/Bed:                 /    Admit/Disch:              05/27/20 12:28:00 -    Institution:       [facility] - Case Times                                                                                                         Entry 1                                                                                                          Patient      In Room Time             05/27/20 15:56:00               Out Room Time                   05/27/20 16:34:00    Anesthesia     Procedure  Start Time               05/27/20 16:08:00               Stop Time                       05/27/20 16:31:00    Last Modified By:         Maxie Better                              05/27/20 16:40:41      [facility] - Case Times Audit                                                                          05/27/20 16:40:41         Owner: [id number]                            Modifier: [id number]                                                     <+> 1         Out Room Time     05/27/20 16:32:07         Owner: [id number]                            Modifier: [id number]                                                     <+> 1         Stop Time        [facility] - Case Attendance                                                                                                    Entry 1                         Entry 2                         Entry 3  Case Attendee             LAMB,  JOSHUA H-MD               Gwinda Maine R-CRNA           Roche,  [place]    Role Performed            Surgeon Primary                 CRNA                            Circulator    Time In                   05/27/20 15:56:00               05/27/20 15:56:00               05/27/20 15:56:00    Time Out     Procedure                 Ankle and/or Hind Foot          Ankle and/or Hind Foot          Ankle and/or Hind Foot                              Hardware Removal(Left)          Hardware Removal(Left)          Hardware Removal(Left)    Last Modified By:         Edison Simon                Roche,  Karen-RN                              05/27/20 16:24:48               05/27/20 16:24:48               05/27/20 16:24:48                                Entry 4                         Entry 5                         Entry 6                                          Case Attendee             Toy Cookey, RN, Kyrin               [place],  KATE S-PA                Alden Benjamin K-MD    Role Performed            Surgical Scrub  First Assistant                 Anesthesiologist    Time In                   05/27/20 15:56:00               05/27/20 15:56:00    Time Out     Procedure                 Ankle and/or Hind Foot          Ankle and/or Hind Foot          Ankle and/or Hind Foot                              Hardware Removal(Left)          Hardware Removal(Left)          Hardware Removal(Left)    Last Modified By:         Maxie Better                Roche,  Karen-RN                Roche,  Karen-RN                              05/27/20 16:24:48               05/27/20 16:24:48               05/27/20 16:24:48      [facility] - Case Attendance Audit                                                                     05/27/20 16:24:48         Owner: [id number]                            Modifier: [id number]                                                     Entry 1 was deleted.  Higher numbered entries shifted one  position to fill the gap.        <-> 1         Case Attendee                          CHOI,  Glasscock-MD        <-> 1         Role Performed                         Anesthesiologist        <-> 1         Time In        <-> 1         Procedure  Ankle and/or Hind Foot Hardware Removal(Left)            2     <*> Case Attendee                          LAMB,  JOSHUA H-MD            2     <*> Role Performed                         Surgeon Primary            2     <+> Time In            2     <*> Procedure                              Ankle and/or Hind Foot Hardware Removal(Left)            3     <*> Case Attendee                          Gwinda Maine R-CRNA            3     <*> Role Performed                         CRNA            3     <+> Time In            3     <*> Procedure                              Ankle and/or Hind Foot Hardware Removal(Left)            4     <*> Case Attendee                          Roche,  Karen-RN            4     <*> Role Performed                         Circulator            4     <+> Time In            4     <*> Procedure                              Ankle and/or Hind Foot Hardware Removal(Left)            5     <*> Case Attendee                          Toy Cookey, RN, Kyrin            5     <*> Role Performed                         Surgical Scrub  5     <+> Time In            5     <*> Procedure                              Ankle and/or Hind Foot Hardware Removal(Left)     05/27/20 15:46:18         Owner: [id number]                               Modifier: [id number]                                                     <+> 3         Case Attendee        <+> 3         Role Performed        <+> 3         Procedure        <+> 4         Case Attendee        <+> 4         Role Performed        <+> 4         Procedure        <+> 5         Case Attendee        <+> 5         Role Performed        <+> 5         Procedure        <+> 6         Case Attendee        <+> 6          Role Performed        <+> 6         Procedure        [facility] - Skin Assessment                                                                          Pre-Care Text:            A.240 Assesses baseline skin condition Im.120 Implements protective measures to prevent skin or tissue injury           due to mechanical sources  Im.280.1 Implements progective measures to prevent skin or tissue injury due to           thermal sources Im.360 Monitors for signs and symptons of infection                              Entry 1  Skin Integrity            Intact    Last Modified By:         Maxie Better                              05/27/20 15:46:27    Post-Care Text:            E.10 Evaluates for signs and symptoms of physical injury to skin and tissue E.270 Evaluate tissue perfusion           O.60 Patient is free from signs and symptoms of injury caused by extraneous objects   O.210 Patinet's tissue           perfusion is consistent with or improved from baseline levels      [facility] - Patient Positioning                                                                      Pre-Care Text:            A.240 Assesses baseline skin condition A.280 Identifies baseline musculoskeletal status A.280.1 Identifies           physical alterations that require additional precautions for procedure-specific positioning A.510.8 Maintains           patient's dignity and privacy Im.120 Implements protective measures to prevent skin/tissue injury due to           mechanical sources Im.40 Positions the patient Im.80 Applies safety devices                              Entry 1                                                                                                          Procedure                 Ankle and/or Hind Foot          Body Position                   Supine                              Hardware Removal(Left)    Left Arm  Position         Resting at Side                 Right Arm Position              Resting at Side    Left Leg Position         Held on [facility]  Right Leg Position              Extended    Feet Uncrossed            Yes                             Pressure Points                 Yes                                                              Checked     Additional                PROCEDURE ON STRETCHER          Positioning Device              Safety Strap, Pillow    Information     Positioned By             Gwinda Maine R-CRNA,          Outcome Met (O.80)              Yes                              Roche,  Andrey Farmer K-MD,                              [place],  KATE S-PA    Last Modified By:         Maxie Better                              05/27/20 16:29:02    Post-Care Text:            A.240 Assesses baseline skin condition A.280 Identifies baseline musculoskeletal status A.280.1 Identifies           physical alterations that require additional precautions for procedure-specific positioning A.510.8 Maintains           patient's dignity and privacy Im.120 Implements protective measures to prevent skin/tissue injury due to           mechanical sources Im.40 Positions the patient Im.80 Applies safety devices      [facility] - Skin Prep                                                                                Pre-Care Text:            A.30 Verifies allergies A.20 Verifies procedure, surgical site, and laterality A.510.8 Maintains paritnet's  dignity and privacy Im.270 Performs Skin Preparation Im.270.1 Implements protective measures to prevent skin           and tissue injury due to chemical sources  A.300.1 Protects from cross-contamination                              Entry 1                                                                                                          Hair Removal     Skin Prep      Prep Agents (Im.270)      Chlorhexidine Gluconate         Prep Area (Im.270)              Ankle and Foot                              2% w/Alcohol     Prep Area Details        Left                            Prep By                         Bluford Main H-MD    Outcome Met (O.100)       Yes    Last Modified By:         Maxie Better                              05/27/20 16:26:57    Post-Care Text:            E.10 Evaluates for signs and symptoms of physical injury to skin and tissue O.100 Patient is free from signs           and symptoms of chemical injury  O.740 The patient's right to privacy is maintained      [facility] - Counts Initial and Final                                                                 Pre-Care Text:            A.20.2 - Assesses the risk for unintended retained surgical items Im.20 - Performs required counts                              Entry 1  Initial Counts      Initial Counts           Roche,  Karen-RN,               Items included in               Sponges, Sharps     Performed By             Toy Cookey, RN, Kyrin               the Initial Count     Final Counts      Final Counts             Roche,  Delia Chimes,               Final Count Status              Correct     Performed By             Toy Cookey, RN, Kyrin     Items Included in        Sponges, Sharps     Final Count     Outcome Met (O.20)        Yes    Last Modified By:         Maxie Better                              05/27/20 16:25:24    Post-Care Text:            E.50 - Evaluates results of the surgical count O.20 - Patient is free from unintended retained surgical items      [facility] - Counts Initial and Final Audit                                                            05/27/20 16:25:24         Owner: [id number]                            Modifier: [id number]                                                     <+> 1         Final Count Status        <+> 1          Outcome Met (O.20)        [facility] - General Case Data                                                                        Pre-Care Text:            A.350.1 Classifies surgical wound  Entry 1                                                                                                          Case Information      ASA Class                3                               Case Level                      Level 3     OR                       RH1 01                          Specialty                       Orthopedic (SN)     Wound Class              1-Clean    Preop Diagnosis           Z98.1/R93.3/M24.573             Postop Diagnosis                Z98.1/R93.3/M24.573    Last Modified By:         Maxie Better                              05/27/20 16:23:43    Post-Care Text:            O.760 Patient receives consistent and comparable care regardless of the setting      [facility] - Fire Risk Assessment                                                                                               Entry 1  Fire Risk                 Alcohol Based Prep              Fire Risk Score                 0-1    Assessment: If            Solution    checked, checkmark     = 1 point     Fire Risk Protocol     (Reference Only)     Last Modified By:         Maxie Better                              05/27/20 16:26:00      [facility] - Time Out                                                                                 Pre-Care Text:            A.10 Confirms patient identity A.20 Verifies operative procedure, surgical site, and laterality A.20.1 Verifies           consent for planned procedure A.30 Verifies allergies                              Entry 1                                                                                                          Procedure                 Ankle and/or Hind  Foot          Patient name and                Yes                              Hardware Removal(Left)          DOB confirmed.                                                               Allergies  confirmed. Surgical                                                               procedure to be                                                               performed confirmed                                                               and verified by                                                               completed surgical                                                               consent     Surgical site             Yes                             Essential imaging,              Yes    confirmed. Correct                                        required blood     surgical site                                             products, implants,     marked and initials                                       devices and/or     are visible through                                       special equipment     prepped and draped  available and     field (or                                                 sterilization     alternative ID band                                       indicators confirmed     used), if applicable     Surgeon shares            Yes                             Anesthesia shares               Yes    operative plan,                                           anesthetic plan and     anticipated                                               reviews patient     specimens                                                 specific concerns     discussed, possible                                       and confirms     difficulties,                                             administration of     expected duration,                                        antibiotics, if     anticipated blood                                          applicable     loss and reviews     all     critical/specific     concerns     Fire risk                 Yes                             Surgeon states:  Yes    assessment scored                                         Does anyone have     and plan discussed                                        any concerns? If                                                               you see, suspect,                                                               or feel that                                                               patient care is                                                               being compromised,                                                               speak up for                                                               patient safety     Time Out Complete         05/27/20 16:08:00    Last Modified By:         Maxie Better                              05/27/20 16:25:48    Post-Care Text:            E.30 Evaluates verification process for correct patient, site, side, and level surgery      [facility] - Debrief  Pre-Care Text:            Im.330 Manages specimen handling and disposition                              Entry 1                                                                                                          Procedure                 Ankle and/or Hind Foot          Final counts                    Yes                              Hardware Removal(Left)          correct and                                                               verbally verified                                                               with                                                               surgeon/licensed                                                               independent                                                               practitioner (if  applicable)     Actual procedure          Yes                             Postop diagnosis                Yes    performed confirmed                                       confirmed     Wound                     Yes                             Confirm specimens               Yes    classification                                            and specimens     confirmed                                                 labeled                                                               appropriately (if                                                               applicable)     Foley catheter            Yes                             Patient recovery                Yes    removed (if                                               plan confirmed     applicable)     Debrief Complete          05/27/20 16:27:00    Last Modified By:         Maxie Better                              05/27/20 16:27:20    Post-Care Text:  E.800 Ensures continuity of care E.50 Evaluates results of the surgical count O.30 Patient's procedure is           performed on the correct site, side, and level O.50 patient's current status is communicated throughout the           continuum of care O.40 Patient's specimen(s) is managed in the appropriate manner      [facility] - Patient Care Devices                                                                     Pre-Care Text:            A.200 Assesses risk for normothermia regulation A.40 Verifies presence of prosthetics or corrective devices           Im.280 Implements thermoregulation measures Im.60 Uses supplies and equipment within safe parameters                              Entry 1                                                                                                          Equipment Type            MACHINE SEQUENTIAL              SCD Sleeve Site                 Foot Right                              COMPRESSION     Initiated Pre             Yes    Induction     Last Modified By:         Maxie Better                              05/27/20 16:26:25    Post-Care Text:            E.10 Evaluates signs and symptoms of physical injury to skin and tissue O.60 Patient is free from signs and           symptoms of injury caused by extraneous objects      [facility] - Dressing/Packing  Pre-Care Text:            A.350 Assesses susceptibility for infection Im.250 Administers care to invasive devices Im.290 Administer care           to wound sites  Im.300 Implements aseptic technique                              Entry 1                                                                                                          Site                      Ankle                           Site Details                    Left    Dressing Item     Details      Dressing Item            4x4's, Medicated Gauze,         Cast/Splint (Im.290)            Cast Padding     (Im.290)                 Elastic wrap    Last Modified By:         Maxie Better                              05/27/20 16:31:50    Post-Care Text:            E.320 Evaluate factors associted with increased risk for postoperative infection at the completion of the           procedure O.200 Patient's wound perfusion is consistent with or improved from baseline levels  O.Patient is           free from signs and symptoms of infection      [facility] - Procedures                                                                               Pre-Care Text:            A.20 Verifies operative procedure, surgical site, and laterality Im.150 Develops individualized plan of care                              Entry 1  Procedure     Description      Procedure                Ankle and/or Hind Foot          Modifiers                        Left                              Hardware Removal     Surgical Procedure       LEFT ANKLE HARDWARE     Text                     REMOVAL **SCREW**    Primary Procedure         Yes                             Primary Surgeon                 Bluford Main H-MD    Start                     05/27/20 16:08:00               Stop                            05/27/20 16:31:00    Anesthesia Type           General                         Surgical Service                Orthopedic (SN)    Wound Class               1-Clean    Last Modified By:         Maxie Better                              05/27/20 16:32:16    Post-Care Text:            O.730 The patinet's care is consistent with the individualized perioperative plan of care      [facility] - Procedures Audit                                                                          05/27/20 16:32:16         Owner: [id number]                            Modifier: [id number]                                                     <+>  1         Stop        [facility] - Transfer                                                                                                           Entry 1                                                                                                          Transferred By            Maxie Better,               Via                             Stretcher                              Gwinda Maine R-CRNA    Post-op Destination       PACU    Skin Assessment      Condition                Intact    Last Modified By:         Maxie Better                              05/27/20 16:24:15      Case Comments                                                                                         <None>              Finalized By: Maxie Better      Document Signatures  Signed By:           Maxie Better 05/27/20 16:41

## 2020-05-27 NOTE — Assessment & Plan Note (Signed)
PreOp Record - [facility]             PreOp Record - [facility] Summary                                                                     Primary Physician:        Tommy Rainwater H-MD    Case Number:              [id number]    Finalized Date/Time:      05/27/20 13:55:49    Pt. Name:                 Dylan Blankenship, Dylan Blankenship    D.O.B./Sex:               April 03, 1960    Male    Med Rec #:                [id number]    Physician:                Tommy Rainwater H-MD    Financial #:              [id number]    Pt. Type:                 S    Room/Bed:                 /    Admit/Disch:              05/27/20 12:28:00 -    Institution:       [facility] Case Attendance - PreOp                                                                                              Entry 1                         Entry 2                                                                          Case Attendee             LAMB,  JOSHUA H-MD              KEMP, RN, JUDY M    Role Performed            Surgeon Primary                 Preoperative Nurse    Time In     Time Out  Last Modified By:         Marden Noble, RN, Armando Gang, RN, JUDY M                              05/27/20 13:05:33               05/27/20 13:05:33      [facility] - Case Times - PreOp                                                                                                 Entry 1                                                                                                          Patient In Room Time      05/27/20 12:55:00               Nurse In Time                   05/27/20 13:09:00    Nurse Out Time            05/27/20 13:55:00               Patient Ready for               05/27/20 13:55:00                                                              Surgery/Procedure     Last Modified By:         Marden Noble RN, JUDY M                              05/27/20 13:55:39      [facility] - Case Times - PreOp Audit                                                                  05/27/20 13:55:39          Owner: [email or web address]  Modifier: [email or web address]                                                        <+> 1         Patient Ready for Surgery/Procedure        <+> 1         Nurse Out Time     05/27/20 13:09:53         Owner: [email or web address]                               Modifier: [email or web address]                                                        <+> 1         Nurse In Time                Finalized By: Marden Noble RN, JUDY M      Document Signatures                                                                             Signed By:           Marden Noble RN, JUDY M 05/27/20 13:55

## 2020-05-27 NOTE — Case Communication (Signed)
Discharge Follow-Up Form - Text       Discharge Follow-Up Entered On:  05/29/2020 9:43 EDT    Performed On:  05/29/2020 9:42 EDT by JEFFCOAT, RN, CONSTANCE               Clinical   Provider Follow-Up Post Discharge RTF :   Follow-Up Appointments    With:  F/U WITH KATE EDEN, PA-C OR ALEX MILLER, PA-C 14 DAYS POST OP, CALL OFFICE FOR APPT/WITH QUESTIONS OR CONCERNS. [phone].    With:  Cristina Gong  Address:  business (1), [address] [place], [place], [place], [address];[phone] Business (1)       JEFFCOAT, RN, CONSTANCE - 05/29/2020 9:42 EDT   Surgery Evaluation   CM Surg DC Instr Clr :   Yes   CM Surg Pain Controlled :   Yes   CM Surg Nausea/Vomiting :   No   CM Surg FU Appt :   No   CM Surg Staff Recognize :   No   CM Surg Add'l Questions :   how much did i weigh, do i need to change dressing-reviewed dc inst that says keep clean and dry   JEFFCOAT, RN, CONSTANCE - 05/29/2020 9:42 EDT   Status   Case Status :   Completed   JEFFCOAT, RN, CONSTANCE - 05/29/2020 9:42 EDT

## 2020-05-27 NOTE — Anesthesia Post-Procedure Evaluation (Signed)
Postanesthesia Evaluation        Patient:   Dylan Blankenship, Dylan Blankenship             MRN: [id number]            FIN: [id number]               Age:   60 years     Sex:  Male     DOB:  [birth year]   Associated Diagnoses:   None   Author:   Gillis Ends K-MD      Postoperative Information   Post Operative Info:          Post operative day: Post Anesthesia Care Unit.         Patient location: PACU.       Assessment   Postanesthesia assessment   Vitals: Vital Signs   05/27/2020 13:10 EDT Systolic Blood Pressure 110 mmHg    Diastolic Blood Pressure 67 mmHg    Temperature Oral 36.9 degC    Heart Rate Monitored 67 bpm    Respiratory Rate 20 br/min    SpO2 96 %      .     Respiratory function: Respiratory rate, airway, and oxygen saturation are at adequate levels.     Cardiovascular function: Heart Rate stable, Blood Pressure stable, Postoperative hydration status Adequate.     Mental status: appropriate for level of anesthesia.     Temperature: within normal limits.     Pain Control: Adequate.     Nausea/Vomiting: Absent.     Signature Line     Electronically Signed on 05/27/2020 04:35 PM EDT   ________________________________________________   Gillis Ends K-MD

## 2020-05-27 NOTE — Nursing Note (Signed)
Nursing Discharge Summary - Text       Physician Discharge Summary Entered On:  05/27/2020 15:50 EDT    Performed On:  05/27/2020 15:49 EDT by Reynold Bowen S-PA               DC Information   Provider Instructions for Diet :   A Healthy Diet, Regular diet   Provider Instructions for Activity :   Other: WEIGHTBEARING AS TOLERATED LEFT FOOT, POST OP SHOE, ELEVATION AS NEEDED   Provider Instructions for Wound Care :   Keep your dressing clean and dry   Reynold Bowen S-PA - 05/27/2020 15:49 EDT

## 2020-07-08 LAB — LIPID PANEL
Chol/HDL Ratio: 2.8 (ref 0.0–4.4)
Cholesterol: 103 mg/dL (ref 100–200)
HDL: 37 mg/dL — ABNORMAL LOW (ref >=40)
LDL Cholesterol: 49 mg/dL (ref 0.0–100.0)
LDL/HDL Ratio: 1.3
Triglycerides: 85 mg/dL (ref 0–149)
VLDL: 17 mg/dL (ref 5.0–40.0)

## 2020-07-08 LAB — COMPREHENSIVE METABOLIC PANEL
ALT: 33 U/L (ref 0–50)
AST: 19 U/L (ref 0–50)
Albumin/Globulin Ratio: 1.71 (ref 1.00–2.70)
Albumin: 4.1 g/dL (ref 3.5–5.2)
Alk Phosphatase: 66 U/L (ref 40–130)
Anion Gap: 11 mmol/L (ref 2–17)
BUN: 12 mg/dL (ref 6–20)
CO2: 26 mmol/L (ref 22–29)
Calcium: 9.3 mg/dL (ref 8.6–10.0)
Chloride: 104 mmol/L (ref 98–107)
Creatinine: 0.5 mg/dL — ABNORMAL LOW (ref 0.7–1.3)
Est, Glom Filt Rate: 118 mL/min/1.73m?? (ref >=90)
Globulin: 2.4 g/dL (ref 1.9–4.4)
Glucose: 102 mg/dL — ABNORMAL HIGH (ref 70–99)
OSMOLALITY CALCULATED: 281 mOsm/kg (ref 270–287)
Potassium: 4 mmol/L (ref 3.5–5.3)
Sodium: 141 mmol/L (ref 135–145)
Total Bilirubin: 0.2 mg/dL (ref 0.00–1.20)
Total Protein: 6.5 g/dL (ref 6.4–8.3)

## 2020-07-08 LAB — CBC WITH AUTO DIFFERENTIAL
Absolute Baso #: 0 10*3/uL (ref 0.0–0.2)
Absolute Eos #: 0.1 10*3/uL (ref 0.0–0.5)
Absolute Lymph #: 1.2 10*3/uL (ref 1.0–3.2)
Absolute Mono #: 0.8 10*3/uL (ref 0.3–1.0)
Basophils %: 0.2 % (ref 0.0–2.0)
Eosinophils %: 1.7 % (ref 0.0–7.0)
Hematocrit: 47.3 % (ref 38.0–52.0)
Hemoglobin: 15.1 g/dL (ref 13.0–17.3)
Immature Grans (Abs): 0.01 10*3/uL (ref 0.00–0.06)
Immature Granulocytes: 0.2 % (ref 0.0–0.6)
Lymphocytes: 22.5 % (ref 15.0–45.0)
MCH: 31.1 pg (ref 27.0–34.5)
MCHC: 31.9 g/dL — ABNORMAL LOW (ref 32.0–36.0)
MCV: 97.3 fL (ref 84.0–100.0)
MPV: 8.8 fL (ref 7.2–13.2)
Monocytes: 14.8 % — ABNORMAL HIGH (ref 4.0–12.0)
NRBC Absolute: 0 10*3/uL (ref 0.000–0.012)
NRBC Automated: 0 % (ref 0.0–0.2)
Neutrophils %: 60.6 % (ref 42.0–74.0)
Neutrophils Absolute: 3.3 10*3/uL (ref 1.6–7.3)
Platelets: 208 10*3/uL (ref 140–440)
RBC: 4.86 x10e6/mcL (ref 4.00–5.60)
RDW: 13.2 % (ref 11.0–16.0)
WBC: 5.4 10*3/uL (ref 3.8–10.6)

## 2020-07-08 LAB — HEMOGLOBIN A1C
Est. Avg. Glucose, WB: 105
Est. Avg. Glucose-calculated: 111
Hemoglobin A1C: 5.3 % (ref 4.0–6.0)

## 2020-07-09 LAB — PSA SCREENING: Screening PSA: 1.1 ng/mL (ref 0.000–4.000)

## 2020-08-05 LAB — POC COVID-19 & INFLUENZA COMBO (LIAT IN HOUSE)
INFLUENZA A: NOT DETECTED
INFLUENZA B: NOT DETECTED
SARS-CoV-2: DETECTED — AB

## 2020-08-25 NOTE — Telephone Encounter (Signed)
Patient was seen this morning by vascular surgery, Dr. Burna Cash, who is recommending Dr. Randa Lynn perform the amputation. Patient understands I will need to speak with Dr. Randa Lynn regarding proceeding with surgery or referring back to [facility].

## 2020-08-25 NOTE — Telephone Encounter (Signed)
Pt is ready to schedule his amputee

## 2020-08-27 NOTE — Telephone Encounter (Signed)
After speaking Dr. Randa Lynn, it is still preferred for Dylan Blankenship to seek amputation from vascular surgery. I have called [name] back with this information and instructed him to call back if Dr. Burna Cash remains unable to also complete the amputation.

## 2020-08-28 LAB — POC COVID-19 (SOFIA II IN HOUSE): SARS-CoV-2: NEGATIVE

## 2020-08-29 ENCOUNTER — Encounter: Attending: Registered" | Primary: Family Medicine

## 2020-08-30 ENCOUNTER — Telehealth

## 2020-08-30 NOTE — Telephone Encounter (Signed)
Patient says Dr Burna Cash will not do the amputation and he needs a referral to someone else. Please call and advise.

## 2020-09-01 NOTE — Telephone Encounter (Signed)
Dr. Randa Lynn is suggesting Dr. Whitney Post at [facility] for amputation. A referral has been sent for possible left BKA and I have left a VM for Dylan Blankenship.

## 2020-09-02 NOTE — Telephone Encounter (Signed)
Faxed again to number provided as well as last OV note from appointment on 7/18.

## 2020-09-02 NOTE — Telephone Encounter (Signed)
DID NOT RECEIVE REFERRAL, PLEASE FAX [phone]

## 2020-09-30 ENCOUNTER — Encounter

## 2020-09-30 MED ORDER — ATORVASTATIN CALCIUM 20 MG PO TABS
20 MG | ORAL_TABLET | ORAL | 4 refills | Status: DC
Start: 2020-09-30 — End: 2020-11-23

## 2020-09-30 NOTE — Telephone Encounter (Signed)
Last ov 08/15/20

## 2020-10-02 NOTE — Telephone Encounter (Signed)
Pts INR was 1.8 but he is currently in the hsp due to he had his L leg amputated and had to hold coumadin x 5 days. Currently on heparin and surgeon will restart coumadin dosage.

## 2020-10-13 LAB — PROTIME-INR
INR: 1.9 (ref 1.5–3.5)
Protime: 22.1 seconds — ABNORMAL HIGH (ref 11.6–14.5)

## 2020-10-15 ENCOUNTER — Encounter: Payer: PRIVATE HEALTH INSURANCE | Attending: Emergency | Primary: Family Medicine

## 2020-10-16 LAB — PROTIME-INR
INR: 2 (ref 1.5–3.5)
Protime: 23 seconds — ABNORMAL HIGH (ref 11.6–14.5)

## 2020-10-20 LAB — PROTIME-INR
INR: 2.3 (ref 1.5–3.5)
Protime: 26 seconds — ABNORMAL HIGH (ref 11.6–14.5)

## 2020-10-23 LAB — PROTIME-INR
INR: 2.3 (ref 1.5–3.5)
Protime: 26 seconds — ABNORMAL HIGH (ref 11.6–14.5)

## 2020-10-30 LAB — PROTIME-INR
INR: 2 (ref 1.5–3.5)
Protime: 23 seconds — ABNORMAL HIGH (ref 11.6–14.5)

## 2020-11-06 LAB — PROTIME-INR
INR: 2 (ref 1.5–3.5)
Protime: 23 seconds — ABNORMAL HIGH (ref 11.6–14.5)

## 2020-11-14 LAB — PROTIME-INR
INR: 2.3 (ref 1.5–3.5)
Protime: 25.4 seconds — ABNORMAL HIGH (ref 11.6–14.5)

## 2020-11-17 ENCOUNTER — Ambulatory Visit
Admit: 2020-11-17 | Discharge: 2020-11-17 | Payer: PRIVATE HEALTH INSURANCE | Attending: Family Medicine | Primary: Family Medicine

## 2020-11-17 DIAGNOSIS — F411 Generalized anxiety disorder: Secondary | ICD-10-CM
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
## 2020-11-23 MED ORDER — ATORVASTATIN CALCIUM 20 MG PO TABS
20 MG | ORAL_TABLET | Freq: Every day | ORAL | 4 refills | Status: AC
Start: 2020-11-23 — End: ?

## 2021-02-24 NOTE — Telephone Encounter (Signed)
Patient stated that his medication is not working and he would like a substution for traZODone (DESYREL) 50 MG tablet. He's been taking medication more frequently and will be out of medication sooner than normal. Please give a call back

## 2021-02-24 NOTE — Telephone Encounter (Signed)
Appt made to discuss

## 2021-03-03 ENCOUNTER — Ambulatory Visit
Admit: 2021-03-03 | Discharge: 2021-03-03 | Payer: PRIVATE HEALTH INSURANCE | Attending: Family Medicine | Primary: Family Medicine

## 2021-03-03 DIAGNOSIS — D6851 Activated protein C resistance: Secondary | ICD-10-CM

## 2021-03-03 MED ORDER — ZOLPIDEM TARTRATE 5 MG PO TABS
5 MG | ORAL_TABLET | Freq: Every evening | ORAL | 0 refills | Status: AC | PRN
Start: 2021-03-03 — End: 2021-04-02

## 2021-03-03 NOTE — Progress Notes (Signed)
Dylan Blankenship (DOB:  [birth year]) is a 61 y.o. male here for evaluation of the following chief complaint(s):  Insomnia (Taking Trazodone - not working. Will go to bed around 8pm, wakes up by 1 or 2 am. )    Assessment and Plan   1. Factor V Leiden mutation (HCC)  -     Protime-INR; Standing  2. 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 Obstructive sleep apnea  -     CPAP Titration Study; Future  4. Primary insomnia  -     zolpidem (AMBIEN) 5 MG tablet; Take 1 tablet by mouth nightly as needed for Sleep for up to 30 days. Max Daily Amount: 5 mg, Disp-30 tablet, R-0Normal    Reviewed options, at this point we'll setup lab testing for his INR to accomplish that monitoring, and let him know he'll need a new sleep study to help with supplies if we can't get a copy of his previous.  We'll see about helping with that and providing further supplies.  He's not had previous episodes of abnormal sleep behaviors, we discussed risks of using sedatives at night with OSA, but he reports regular compliance and use of the CPAP and aware of those risks and feels he requires assistance to successfully sleep with the equipment.    No follow-ups on file.  Subjective   HPI  Patient is here to discuss continued insomnia.  The trazodone has not been working for the past several months.  He has been able to fall asleep easily, but he wakes up around 2 times throughout the night (sometimes to urinate but not always).  After waking up, he has difficulty going back to sleep.  He has been using his CPAP every night.   He has not had a sleep study performed since he lived in [place] (several years ago).  The CPAP company stopped sending him supplies because he owes them too much money.  He also has not been able to get the supplies for his at-home INR/PT tests.  Both of these problems have been going on for about a month.  He only has one strip left.    Review of  Systems  See HPI for details  Objective   Physical Exam  General Appearance:  Well appearing, developed and nourished.  Appears stated age.  No acute distress.  Head:  Normocephalic, atraumatic.  Nose:  Nares patent, no lesions.  Heart:  Regular rate and regular rhythm.  No murmurs.  No rubs or gallops.  Lungs:  Clear to auscultation bilaterally with good air movement.  No focal adventitial sounds.  Skin:  No rashes noted.  Musculoskeletal:  Grossly full range of motion.  Left-sided BKA healing well, 2 crusted areas on larger lateral aspect of wound, one very small area right sided without associated erythema, tenderness, warmth or drainage.  Neurologic:  Grossly non focal.  Psych:  Cognitive function intact.  Judgement and insight normal.  Mood and affect appropriate.    No results found for any visits on 03/03/21.  An electronic signature was used to authenticate this note.  Laurence Ferrari, MD

## 2021-03-11 ENCOUNTER — Telehealth

## 2021-03-11 NOTE — Telephone Encounter (Signed)
Carissa calling back. Please call

## 2021-03-11 NOTE — Telephone Encounter (Signed)
Carissa with [facility] is requesting a callback today in regards to some things she needs in regards to the patient. Please call

## 2021-03-11 NOTE — Telephone Encounter (Signed)
LM

## 2021-03-11 NOTE — Telephone Encounter (Signed)
Need a referral for PT/OT evaluation.  Admission to [facility] for prosthetic training. Referral placed and chart note faxed to carissa.

## 2021-03-13 ENCOUNTER — Encounter

## 2021-03-16 NOTE — Telephone Encounter (Signed)
From: Cresenciano Lick  To: Dr. Noreene Filbert Hyson  Sent: 03/13/2021 11:20 AM EST  Subject: Ambien     Clifton Custard. I stopped taking Ambien. Gave me bad nightmares. Any other meds to suggest i can use?  Thx  Tom

## 2021-03-17 MED ORDER — TEMAZEPAM 15 MG PO CAPS
15 MG | ORAL_CAPSULE | Freq: Every evening | ORAL | 5 refills | Status: AC | PRN
Start: 2021-03-17 — End: 2021-04-16

## 2021-03-17 NOTE — Addendum Note (Signed)
Addended by: Jaquita Rector D on: 03/17/2021 09:49 AM     Modules accepted: Orders

## 2021-03-23 NOTE — Telephone Encounter (Signed)
Cindy from [facility]   is in need of order for occupational therapy fax to [phone]

## 2021-03-23 NOTE — Telephone Encounter (Signed)
I'm seeing that the referral was sent already, but they are calling again. I also see that a sec referral was placed today. Please advise thanks

## 2021-03-24 ENCOUNTER — Telehealth

## 2021-03-24 DIAGNOSIS — G4733 Obstructive sleep apnea (adult) (pediatric): Secondary | ICD-10-CM

## 2021-03-24 NOTE — Telephone Encounter (Signed)
LM for Vernona Rieger to call me back.

## 2021-03-24 NOTE — Telephone Encounter (Signed)
[facility] says the fax they received only has a diagnosis please complete referral and fax to [phone]  Phone [phone]

## 2021-03-24 NOTE — Telephone Encounter (Signed)
Noted thank you

## 2021-03-25 ENCOUNTER — Encounter: Payer: PRIVATE HEALTH INSURANCE | Primary: Family Medicine

## 2021-03-25 ENCOUNTER — Ambulatory Visit: Payer: PRIVATE HEALTH INSURANCE | Primary: Family Medicine

## 2021-03-25 ENCOUNTER — Inpatient Hospital Stay: Admit: 2021-03-25 | Payer: MEDICARE | Primary: Family Medicine

## 2021-03-25 ENCOUNTER — Encounter: Payer: MEDICARE | Primary: Family Medicine

## 2021-03-25 NOTE — Telephone Encounter (Signed)
Spoke with Vernona Rieger and I found out I had to place the referral under external for it to appear under other orders.

## 2021-03-25 NOTE — Progress Notes (Signed)
Duplicate note.

## 2021-03-25 NOTE — Progress Notes (Signed)
Dylan Blankenship  DOB 18-Mar-1960   Outpatient Physical Therapy  Evaluation  Episodes [facility] PT   [address]  [place] [address]  Phone: [phone]  Fax: [phone]   Referring Physician Hyson, Noreene Filbert, MD   Medical/Referring Diagnosis Acquired absence of left leg below knee [Z89.512] Date of Onset 09/2020      Primary Insurance [employer]-advantage [employer]  Secondary Insurance  PT Visit Info  Total # of Visits to Date: 1  Effective Dates 03/25/21 TO 06/22/21   Frequency       Visit Diagnosis      Allergies Wellbutrin xl [bupropion]  Past Medical History/Comorbidities  Dylan Blankenship  has a past medical history of Anxiety, Bipolar disorder (HCC), Degenerative joint disease, Depression, Factor V Leiden (HCC), Frontal Lobe Disorder, HLD (hyperlipidemia), IBS (irritable bowel syndrome), Left ankle pain, Left Knee, and OSA (obstructive sleep apnea).  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  Learning       Social History/Living Environment      Prior level of function: Independent  Occupation: On disability, Unemployed    Restrictions/Precautions Obese       SUBJECTIVE   Subjective Pt is a 61 yo CM s/p L BKA performed in Sept 2022. Pt reports that he had 3 L ankle fusions that never healed and caused constant pain for 7 yrs which led to his L BKA. "I couldn't take the pain anymore." Currently, he uses a FWW to ambulate in/outside his apartment. He states that he is scared to negotiate uneven terrain because his balance is "terrible." He's had his prosthesis for 1 mo, and notes that he places a lot of weight through his RLE and walker when negotiating the incline just outside of his apartment. He lives with his girlfriend who works during the day, noting his lack of  transportation. He is interested in going to rehab for prosthetic training.      Pain 7/10  Prior Level of Function/Work/Activity   Prior level of function: Independent  Occupation: On disability, Unemployed      OBJECTIVE   Observation:   - Antalgic gait with FWW, excessive movement in prosthesis during gait, heavy UE assistance on FWW  - L knee flexion 4-/5, L knee extension 4-/5, L hip flexion 4-/5, L hip extension 3+/5, L hip abduction 3-/5  - L hip extension 10-0  - 5 sit to stand test 19 sec --> FALL RISK - most weight through RLE  -TUG test 22 sec with FWW --> FALL RISK  - LE atrophy noted L>R      Functional Outcomes LEFS 20/80    Treatment   Eval 30 min - pt late to appt due to transportation     ASSESSMENT   Assessment Pt will benefit from intense inpatient rehab to maximize functional mobility, safety and independence with ADLs s/p L BKA.     Therapy Prognosis  Good  Activity Tolerance  Decreased functional mobility , Decreased ADL status, Decreased ROM, Decreased tolerance to work activity, Decreased strength, Decreased endurance, Decreased balance, Decreased high-level IADLs, Decreased fine motor control, Increased pain  PLAN   Interventions Planned (Treatment may consist of any combination of the following):    Current Treatment Recommendations: Strengthening, ROM, Balance training, Functional mobility training, Transfer training, ADL/Self-care training, IADL training, Endurance training,  Gait training, Manual, Pain management, Return to work related activity, Home exercise program, Safety education & training, Patient/Caregiver education & training, Modalities, Therapeutic activities        Goals    Short Term Goals Long Term Goals   Time Frame for Short Term Goals: N/A Pt is an inpatient rehab candidate. Time Frame for Long Term Goals : N/A Pt is an inpatient rehab candidate.      Total Duration 0935-1005       Charge Capture     Regarding Proctor Carriker therapy, I certify that the treatment plan  above will be carried out by a therapist or under their direction.  Thank you for this referral,  Everett Graff, PT, DPT   Referring Physician Signature Hyson, Noreene Filbert, MD _______________________________ Date _____________

## 2021-04-03 IMAGING — MR MR SHOULDER*L* W/O CM
4 of 5 series · 25 of 40 positions shown · non-contrast
Comparison: X-ray 07/03/2018

CLINICAL DATA: Shoulder pain 3 months.

EXAM:
MRI OF THE LEFT SHOULDER WITHOUT CONTRAST
TECHNIQUE: Multiplanar, multisequence MR imaging of the shoulder was performed.
No intravenous contrast was administered.

[Series 3: PD fat-sat · axial · 4.0mm · 0.27mm/px · z∈[-65,+35]mm · 8 of 23 slices shown (1 of 2)]
[im 1/23]
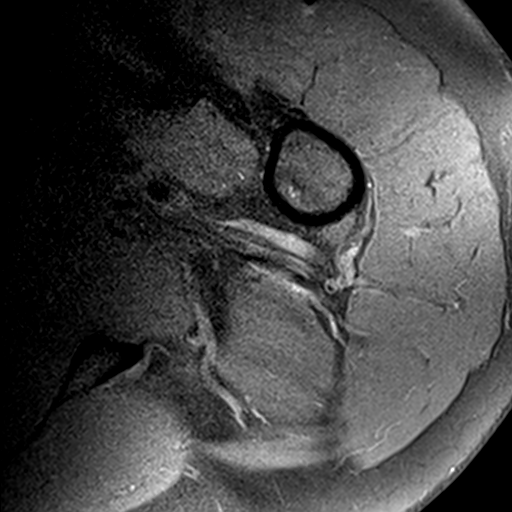
[im 4/23]
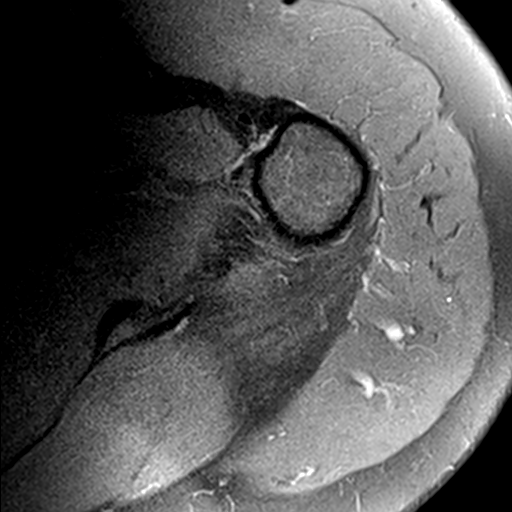
[im 7/23]
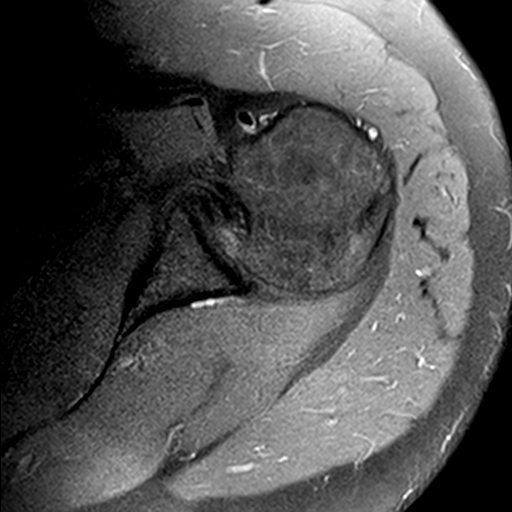
[im 10/23]
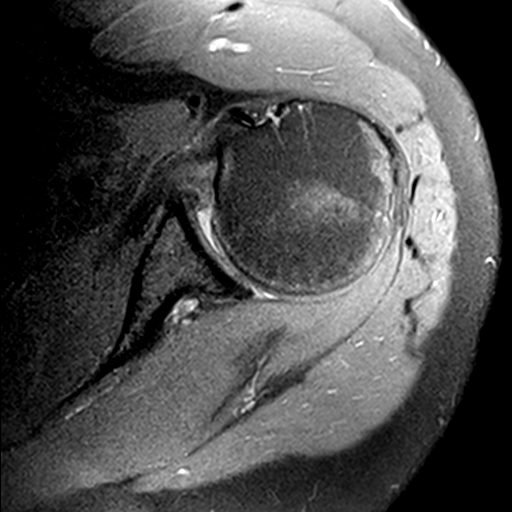
[im 13/23]
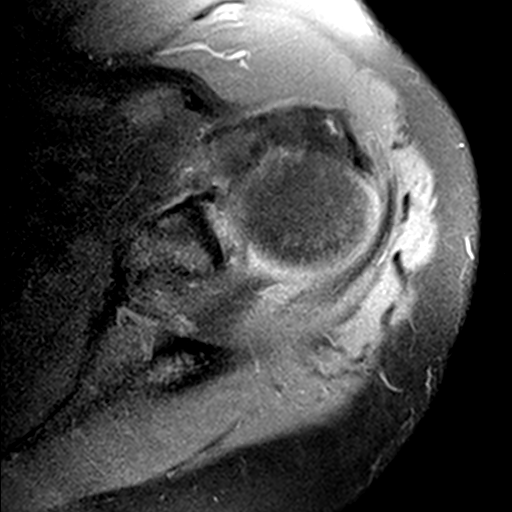
[im 16/23]
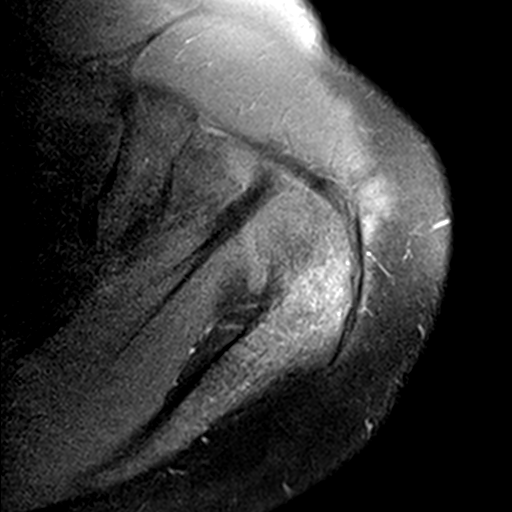
[im 19/23]
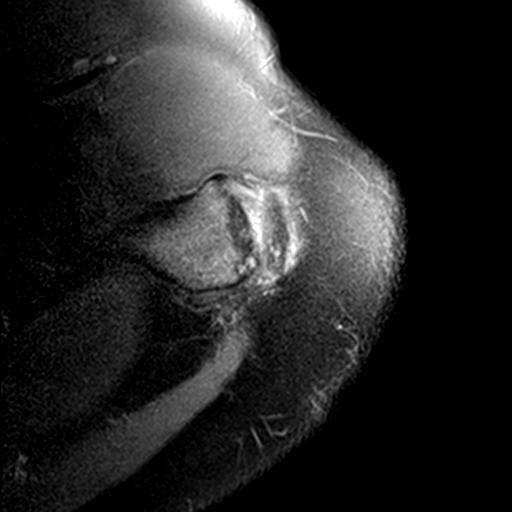
[im 23/23]
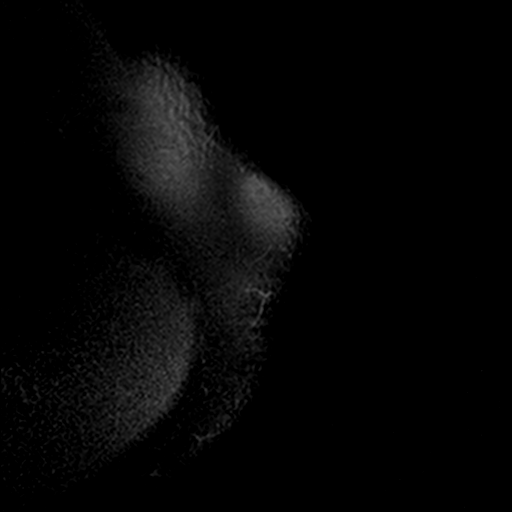

[Series 5: PD fat-sat · oblique · 4.0mm · 0.27mm/px · 8 of 23 slices shown (2 of 2)]
[im 1/23]
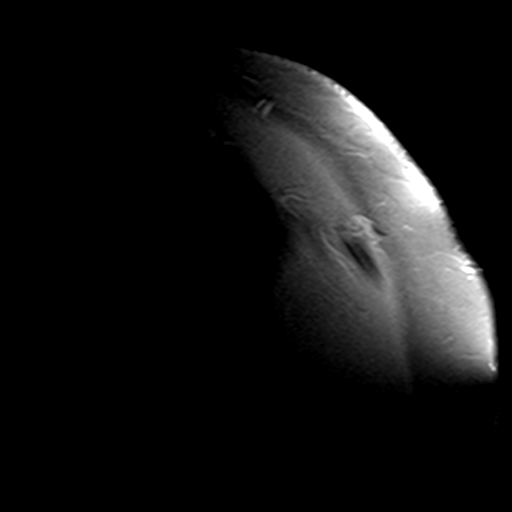
[im 4/23]
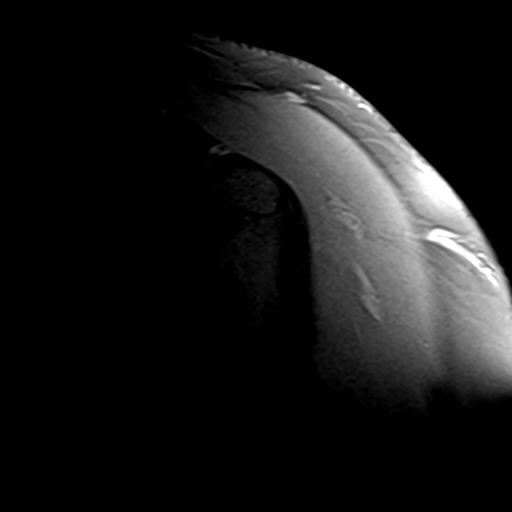
[im 7/23]
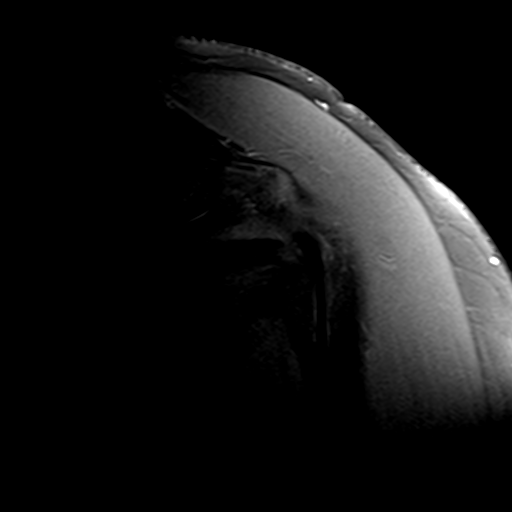
[im 10/23]
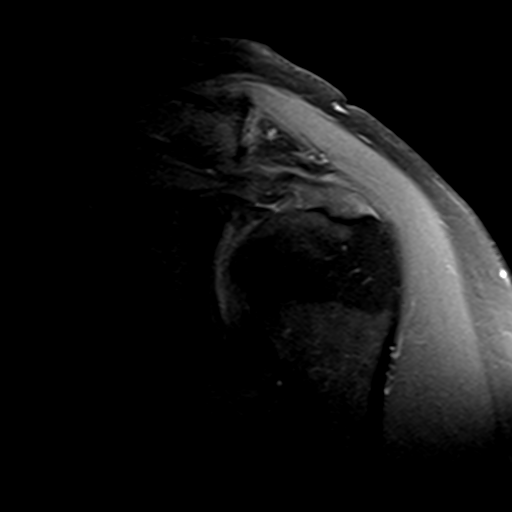
[im 13/23]
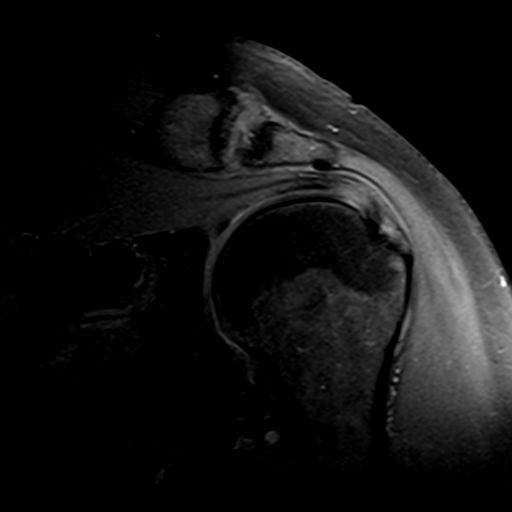
[im 16/23]
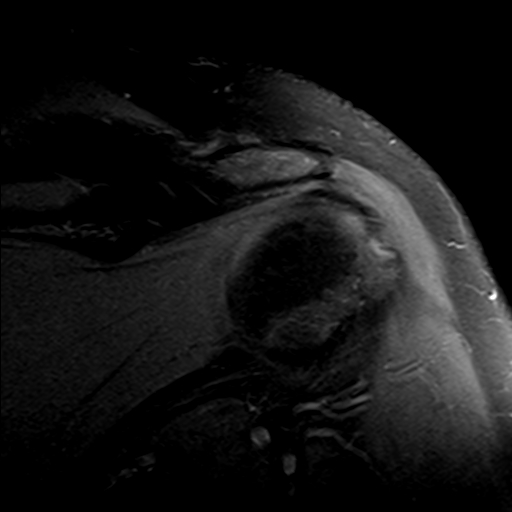
[im 19/23]
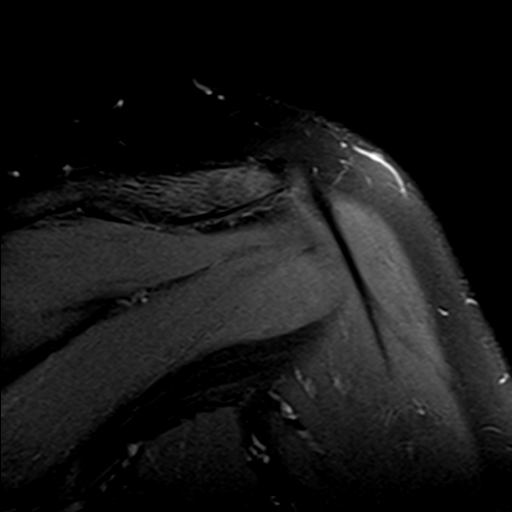
[im 23/23]
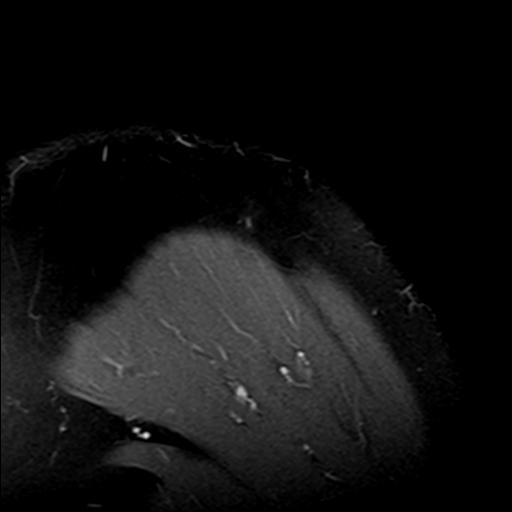

[Series 6: T2 fat-sat · oblique · 4.0mm · 0.55mm/px · 6 of 23 slices shown (1 of 2)]
[im 1/23]
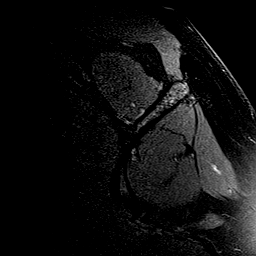
[im 4/23]
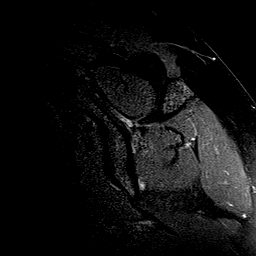
[im 7/23]
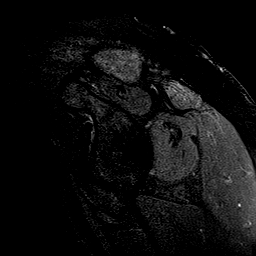
[im 10/23]
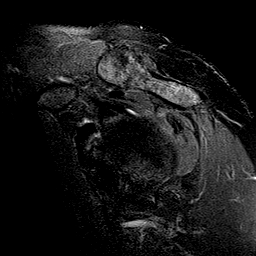
[im 13/23]
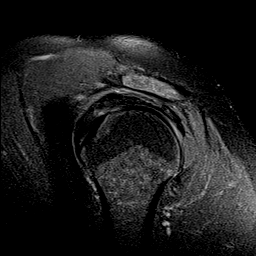
[im 19/23]
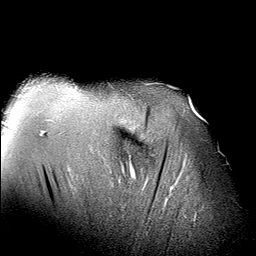

[Series 8: T2 fat-sat · oblique · 4.0mm · 0.55mm/px · 3 of 23 slices shown (2 of 2)]
[im 4/23]
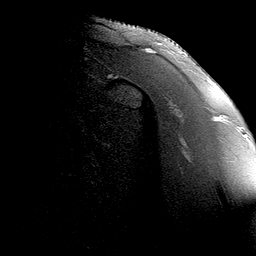
[im 13/23]
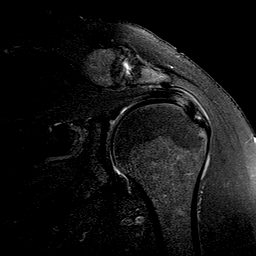
[im 19/23]
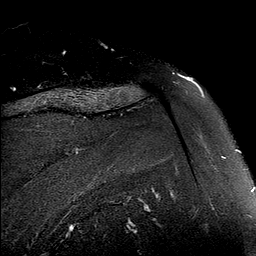

[25 of 40 positions shown; findings below may reference images not displayed]

FINDINGS: Rotator cuff: Low-grade partial-thickness articular surface tear the
mid insertional fibers of the supraspinatus tendon measuring
approximately 5 mm in AP dimension (series 8, images 13-14).
Tendinosis of the supraspinatus tendon. Mild infraspinatus
tendinosis without discrete tear. Subscapularis tendinosis with
partial-thickness tearing of the more superior fibers. Teres minor
distal tendon not well seen.

Muscles:  Complete fatty atrophy of the teres minor muscle.

Biceps long head: Intra-articular biceps tendinosis. Biceps tendon
is medially perched at the groove entry.

Acromioclavicular Joint: Mild-moderate arthropathy of the
acromioclavicular joint. Trace subacromial/subdeltoid bursal fluid.

Glenohumeral Joint: No joint effusion. Partial-thickness cartilage
fissure involving the central glenoid (series 8, image 13). No
discrete humeral head chondral defect.

Labrum: Marked degeneration of the anteroinferior glenoid labrum.
Question posterosuperior labral tear, suboptimally evaluated.

Bones:  No marrow abnormality, fracture or dislocation.

Other: No focal abnormality within the quadrilateral space.
IMPRESSION: 1. Supraspinatus tendinosis with low-grade partial-thickness
articular-surface tear.
2. Tendinosis and partial tearing of the subscapularis tendon.
3. Intra-articular biceps tendinosis with evidence of bicipital
instability.
4. Degenerative changes of the glenohumeral and acromioclavicular
joint including a focal partial-thickness cartilage defect of the
central glenoid.
5. Irregularity of the anteroinferior and posterosuperior labrum
suggesting degeneration and tearing.
6. Complete fatty infiltration of the teres minor muscle suggesting
chronic denervation changes. No discrete abnormality is identified
within the quadrilateral space.

## 2021-04-06 NOTE — Telephone Encounter (Signed)
From: Cresenciano Lick  To: Dr. Noreene Filbert Hyson  Sent: 04/06/2021 4:12 AM EDT  Subject: Phone number change    My new phone number is [phone]. Please correct on my chart . Thank you

## 2021-04-06 NOTE — Telephone Encounter (Signed)
I updated in pt's chart

## 2021-04-07 ENCOUNTER — Inpatient Hospital Stay: Admit: 2021-04-07 | Discharge: 2021-04-07 | Payer: MEDICARE | Primary: Family Medicine

## 2021-04-07 DIAGNOSIS — D6851 Activated protein C resistance: Secondary | ICD-10-CM

## 2021-04-07 LAB — PROTIME-INR
INR: 2.9 (ref 1.5–3.5)
Protime: 31 seconds — ABNORMAL HIGH (ref 11.6–14.5)

## 2021-04-08 ENCOUNTER — Encounter

## 2021-04-08 NOTE — Telephone Encounter (Signed)
From: Dylan Blankenship  To: Dr. Zenovia Jordan Hyson  Sent: 04/08/2021 8:35 AM EDT  Subject: Home colon testing    Hi [name],. As u know I can't afford much of anything. Can I use the home test kit for colon cancer / screening & is it good enough? I want to know!!   Thx  Dylan Blankenship

## 2021-04-08 NOTE — Telephone Encounter (Signed)
From: Cresenciano Lick  To: Dr. Noreene Filbert Hyson  Sent: 04/08/2021 3:08 AM EDT  Subject: Sleep study    What are/were my test results? Thx  Signa Kell

## 2021-04-09 NOTE — Telephone Encounter (Signed)
Pts results came back for his sleep study. Do you need to change his CPAP setting on a new RX and he needed new supplies as well.

## 2021-04-13 ENCOUNTER — Encounter

## 2021-04-20 ENCOUNTER — Inpatient Hospital Stay: Admit: 2021-04-20 | Payer: MEDICARE | Primary: Family Medicine

## 2021-04-20 ENCOUNTER — Encounter: Payer: MEDICARE | Primary: Family Medicine

## 2021-04-20 DIAGNOSIS — Z789 Other specified health status: Secondary | ICD-10-CM

## 2021-04-20 NOTE — Progress Notes (Signed)
Dylan Blankenship  DOB 1960-11-01  Primary Insurance [employer] Medicare-advantage Ppo  Secondary Insurance  [facility]  [address] [place] [address]  Phone: [phone]  Fax: [phone]   OT Visit Info  Total # of Visits to Date: 1  Progress Note Counter: 1      Total # of Visits Approved: 99   Outpatient Occupational Therapy  Evaluation   Episodes   Visit Diagnosis  Visit Diagnoses         Codes    Decreased activities of daily living (ADL)    -  Primary Z78.9           Medical/Referring Diagnosis Acquired absence of left leg below knee [I32.549]  Referring Physician Hyson, Noreene Filbert, MD  MD Orders OT Eval and Treat  Date of Onset    (09/2020)   Allergies Wellbutrin xl [bupropion]  Restrictions/Precautions  Restrictions/Precautions: Fall Risk    SUBJECTIVE   Subjective Pt reports he was independent with his mobility and ADL"s prior to his left BKA 09/2020. He lives with his girlfriend who works during the day. He currently does not drive but was driving I'ly prior to his BKA. Reports he has difficulty with  his standing balance for ADL including activities like cooking, getting in/out of a car, putting laundry away and using both arm while standing. He is unable to carry items while walking due to decreased balance. He is wanting to get back to doing more for himself and around the house to decrease the demand on his girlfriend. Reports he was able to do odd jobs around the house prior the amputation. Pt reports he is interested in going to rehab to increase his overall independence, balance, and ability to do more things without the help of others. Reports he would like to get back to working a part time job.   Pain Initial   0   /10     Past Medical History/Comorbidities  Dylan Blankenship  has a past medical history of Anxiety, Bipolar disorder (HCC), Degenerative joint disease, Depression, Factor V Leiden (HCC), Frontal Lobe Disorder, HLD (hyperlipidemia), IBS (irritable bowel syndrome),  Left ankle pain, Left Knee, and OSA (obstructive sleep apnea).  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  Social History/Living Environment  Lives With: Significant other  Type of Home: Apartment  Home Layout: One level  Bathroom Shower/Tub: Tub/Shower unit (Has an extended tub bench with back. Uses three wheeled scooter to get into the bathroom due to space limitations in bathroom.)  Bathroom Toilet: Standard  Bathroom Equipment: Tub transfer bench, Hand-held shower   Prior Level of Function/Work/Activity  Prior level of function: Independent  Occupation: On disability, Unemployed     Learning  Does the patient/guardian have any barriers to learning?: No barriers    OBJECTIVE     Activities of Daily Living  Grooming: Setup or clean-up assistance  Bathing: Setup or clean-up assistance (Sits on bench for most of bath but does stand with one hand holding on for support.)  Dressing upper body: Independent  Dressing lower body: Setup or clean-up assistance (Uses assist from chair to support self for balance in standing to pull pants over hips.)  Fasteners: Independent  Feeding: Partial/moderate assistance (Pt needs assist preparing meals as he cannot stand at the stove and cook  without needing rest breaks and support for balance.)  Toileting: Setup or clean-up assistance        ASSESSMENT   Assessment  Pt is a very motivated 61 y/o male with left BKA in 09/2020. Pt displays decreased balance for ADL's that involve standing, bending and reaching out from his base of support. He would benefit from inpatient rehab to maximize his overall independence and safety to return to his highest prior level of function.   Activity Limitations  Decreased functional mobility , Decreased ADL status,  Decreased strength, Decreased endurance, Decreased balance, Decreased high-level IADLs, Decreased coordination  Therapy Prognosis  Good  PLAN   Effective Dates 04/20/21 TO 07/18/21  Frequency Current Treatment Recommendations: Strengthening, Balance training, Functional mobility training, Endurance training, Safety education & training, Patient/Caregiver education & training, Return to work related activity, Self-Care / ADL, Coordination training  Interventions Planned (Treatment may consist of any combination of the following):    Strengthening, Balance training, Functional mobility training, Endurance training, Safety education & training, Patient/Caregiver education & training, Return to work related activity, Self-Care / ADL, Coordination training    Goals    Short Term Goals Long Term Goals    N/A Pt is an inpatient rehab candidate   N/A. Pt is an inpatient rehab candidate.    Total Duration  Time In: 1315  Time Out: 1350  Minutes: 35  Charge Capture   Regarding [employer] therapy, I certify that the treatment plan above will be carried out by a therapist or under their direction.  Thank you for this referral,    Referring Physician Signature Hyson, Noreene Filbert, MD _______________________________ Date _____________

## 2021-04-20 NOTE — Progress Notes (Signed)
Dylan Blankenship  DOB January 14, 1961 Outpatient Physical Therapy Treatment Note  Episodes [facility]  [address] [place] [address]  Phone: [phone]  Fax: [phone]   Referring Physician Hyson, Noreene Filbert, MD Medical/Referring Diagnosis Acquired absence of left leg below knee [Z89.512] Date of Onset  10/07/21   Primary Insurance [employer] Medicare-advantage Ppo  Secondary Insurance  PT Visit Info  Total # of Visits to Date: 1  Certification Expiration Date 06/22/21  Frequency     Visit Diagnosis      Restrictions/Precautions  Restrictions/Precautions: Fall Risk     SUBJECTIVE   Subjective   61 yo male s/p left BKA 10/07/20.  Today pt denies any changes in his functional status, strength, ROM or sensation since his PT eval was completed 03/25/21.   During this evaluation, his therapist recommended intense inpatient rehab to maximize his functional mobility, safety and independence with ADL's .  See PT eval dated 03/25/21     Pain 0/10   OBJECTIVE   Treatment : No treatment this date. No charge for today's visit.   LE MMT unchanged from 03/25/21 note.   TUG today 28 sec with FWW indicating FALL RISK  Sit to stand 3 reps in 30 sec indicating FALL RISK         ASSESSMENT   Assessment  My Dylan Blankenship arrived today for an OT evaluation. A brief visit without PT charges was conducted to see if there were any functional changes since his PT eval on 03/25/21.   No changes were identifed .  Mr Dylan Blankenship continues to be a FALL RISK and an excellent candidate for IP rehab.  For more details, see note dated 03/25/21       PLAN   Next Visit Plan no future visits planned        Goals    Short Term Goals Long Term Goals   Time Frame for Short Term Goals: N/A Pt is an inpatient rehab candidate. Time Frame for Long Term Goals : N/A Pt is an inpatient rehab candidate.      Total Duration  1250-1310       Charge Capture

## 2021-04-21 LAB — FECAL DNA COLORECTAL CANCER SCREENING (COLOGUARD): FIT-DNA (Cologuard): NEGATIVE

## 2021-04-21 NOTE — Telephone Encounter (Signed)
Duplicate. Please see TE 04/09/21

## 2021-04-23 NOTE — Telephone Encounter (Signed)
Patient is requesting a call back. Patient needs to know if Dr. Freddy Finner completed and sent back the verification form that his caseworker recently sent to the office for him to start rehab. Please advise

## 2021-04-27 ENCOUNTER — Encounter

## 2021-04-27 NOTE — Telephone Encounter (Signed)
Dylan Blankenship is calling in because patient has denied for acute rehab admission per his insurance. Dylan Blankenship states that they need a referral for Home Health PT and OT evaluate and treat for patient. Please callback and advise or with any additional question. ** if okay with faxing please fax to [phone] attention Carissa.**

## 2021-04-27 NOTE — Telephone Encounter (Signed)
LM informing Dylan Blankenship that pt must have a face to face visit discuss the Home Health referral and I informed pt and made him an appt to discuss.

## 2021-04-27 NOTE — Telephone Encounter (Signed)
From: Cresenciano Lick  To: Dr. Noreene Filbert Hyson  Sent: 04/27/2021 12:49 PM EDT  Subject: Warfarin    I Just Received a message from my [facility] stating DrHyson denied my 7.5 ml warfarin refill. Why?  Thx   Cresenciano Lick

## 2021-04-28 MED ORDER — CPAP MACHINE
0 refills | Status: AC
Start: 2021-04-28 — End: ?

## 2021-04-28 NOTE — Telephone Encounter (Signed)
CPAP supply order faxed to [facility].

## 2021-04-28 NOTE — Telephone Encounter (Signed)
AutoPAP ordered and placed on Andreal's desk to review/fax

## 2021-04-28 NOTE — Telephone Encounter (Signed)
Last ov 03/03/21  Scheduled with Fayrene Fearing 04/30/21  Rx possibly expired. I loaded med if you are keeping pt on it

## 2021-04-29 MED ORDER — WARFARIN SODIUM 7.5 MG PO TABS
7.5 MG | ORAL_TABLET | Freq: Every day | ORAL | 4 refills | Status: AC
Start: 2021-04-29 — End: ?

## 2021-04-30 ENCOUNTER — Ambulatory Visit: Admit: 2021-04-30 | Discharge: 2021-04-30 | Payer: MEDICARE | Attending: Family | Primary: Family Medicine

## 2021-04-30 DIAGNOSIS — R29898 Other symptoms and signs involving the musculoskeletal system: Secondary | ICD-10-CM

## 2021-04-30 MED ORDER — ALBUTEROL SULFATE HFA 108 (90 BASE) MCG/ACT IN AERS
108 (90 Base) MCG/ACT | RESPIRATORY_TRACT | 3 refills | Status: AC
Start: 2021-04-30 — End: ?

## 2021-04-30 NOTE — Progress Notes (Addendum)
CHIEF COMPLAINT:  Chief Complaint   Patient presents with    Other     Discuss Home health referral for PT/ OT; He said that his balance his off and he's favoring his left leg a lot.           HISTORY OF PRESENT ILLNESS:  Mr. Dylan Blankenship is a 61 y.o. male here to request home PT/OT. Following BKA of L leg he was referred to in-patient rehab for orthotic training but was denied by insurance. [facility] [place] requesting we order home PT/OT. Has not undergone rehab/orthotic training as of yet. Afraid to put too much weight on left leg, balance disrupted b/c of this. Ghost pains in left leg. Denies numbness/tingling otherwise.    Noticed bruising to left inner thigh. No other signs of bleeding. Recent INR normal. Denies vision change, HA, facial droop, focal weakness, trouble speaking.  Denies cp, sob, palpitations.            PHQ:  PHQ-9  03/03/2021   Little interest or pleasure in doing things 2   Feeling down, depressed, or hopeless 0   Trouble falling or staying asleep, or sleeping too much 3   Feeling tired or having little energy 3   Poor appetite or overeating 0   Feeling bad about yourself - or that you are a failure or have let yourself or your family down 0   Trouble concentrating on things, such as reading the newspaper or watching television 3   Moving or speaking so slowly that other people could have noticed. Or the opposite - being so fidgety or restless that you have been moving around a lot more than usual 3   Thoughts that you would be better off dead, or of hurting yourself in some way 0   PHQ-2 Score 2   PHQ-9 Total Score 14   If you checked off any problems, how difficult have these problems made it for you to do your work, take care of things at home, or get along with other people? 0       CURRENT MEDICATION LIST:    Outpatient Encounter Medications as of 04/30/2021   Medication Sig Dispense Refill    traZODone (DESYREL) 100 MG tablet TAKE 2 TABLETS BY MOUTH EVERY DAY AT BEDTIME      warfarin (COUMADIN) 7.5 MG  tablet Take 1 tablet by mouth daily 90 tablet 4    CPAP Machine MISC by Does not apply route AutoPAP 5-15. Full facemask 1 per 3 months, full face cusion 1 per month, headgear 1 per 6 months, heated tubing 1 per 3 months, disposable filter 2 per months, non-disposable filter 1 per 6 months, water chamber 1 per 6 months 1 each 0    atorvastatin (LIPITOR) 20 MG tablet Take 1 tablet by mouth daily 90 tablet 4    acetaminophen (TYLENOL) 500 MG tablet 1 to 2 tablets as needed Orally TID      albuterol sulfate HFA (PROVENTIL;VENTOLIN;PROAIR) 108 (90 Base) MCG/ACT inhaler 2 puffs as needed Inhalation every 6 hrs for 90 days      ARIPiprazole (ABILIFY) 10 MG tablet 1 tablet Orally Once a day for 90 days      FLUoxetine (PROZAC) 40 MG capsule 2 Capsules Oral Once a day for 90 days      gabapentin (NEURONTIN) 600 MG tablet 1 capsule 3 times daily.        No facility-administered encounter medications on file as of 04/30/2021.  ALLERGIES:    Allergies   Allergen Reactions    Wellbutrin Xl [Bupropion] Rash        HISTORY:  Past Medical History:   Diagnosis Date    Anticoagulant long-term use 1995    Factor 5 leiden    Anxiety     Bipolar disorder (HCC)     Chronic back pain 2005    Degenerative joint disease     Depression     Factor V Leiden (HCC)     with PE- Chronic Coumadin in 1995 and 1996    Frontal Lobe Disorder     GERD (gastroesophageal reflux disease) 1963    Bornwith hirsch sprungs disease    HLD (hyperlipidemia)     IBS (irritable bowel syndrome)     Left ankle pain     s/p fusion - Boot with increased activity    Left Knee     [facility]    Obesity 2002    OSA (obstructive sleep apnea)     Reports last study > 6 yrs ago - started CPAP ? recommended APAP then not progressed beyond that      Past Surgical History:   Procedure Laterality Date    ANKLE FUSION Left     ankle cyst    ANKLE SURGERY Left 05/2020    screws removed    COLONOSCOPY  12/26/2011    JOINT REPLACEMENT  1963, 2009, 2015, 2019     Hirsch sprungs, bi lateral knee, hip replacement, 2019 ankle,  2022 below leg amputation    KNEE ARTHROPLASTY  2009    Bi lateral knee replacement    KNEE ARTHROSCOPY Bilateral     LEG AMPUTATION BELOW KNEE Left 10/07/2020    OTHER SURGICAL HISTORY      Mega colon secondary hirschsprung's disease age 66 months    TOTAL HIP ARTHROPLASTY Left     x2    VASECTOMY        Social History     Socioeconomic History    Marital status: Single     Spouse name: Not on file    Number of children: Not on file    Years of education: Not on file    Highest education level: Not on file   Occupational History    Not on file   Tobacco Use    Smoking status: Former     Packs/day: 1.00     Years: 15.00     Pack years: 15.00     Types: Cigarettes     Start date: 01/26/1995     Quit date: 06/25/2020     Years since quitting: 0.8    Smokeless tobacco: Never    Tobacco comments:     01/25/1998   Substance and Sexual Activity    Alcohol use: Not Currently     Comment: 2-3 times a week    Drug use: Yes     Types: Marijuana Sheran Fava)     Comment: On & off...occasionally    Sexual activity: Not Currently     Partners: Female     Comment: GF   Other Topics Concern    Not on file   Social History Narrative    Not on file     Social Determinants of Health     Financial Resource Strain: Not on file   Food Insecurity: Not on file   Transportation Needs: Not on file   Physical Activity: Not on file   Stress: Not on file  Social Connections: Not on file   Intimate Partner Violence: Not on file   Housing Stability: Not on file      Family History   Problem Relation Age of Onset    No Known Problems Mother     Heart Disease Father     No Known Problems Sister     No Known Problems Sister     No Known Problems Brother         REVIEW OF SYSTEMS:  Review of systems is as indicated in HPI, otherwise negative.    PHYSICAL EXAM:  GENERAL APPEARANCE: alert and oriented to person, place and time, in no acute distress, appears stated age, nontoxic.           HEAD:   normocephalic, atraumatic .           HEART: no murmurs, normal rate and regular rhythm, S1, S2 normal.           LUNGS: clear to auscultation bilaterally, no wheezes, rales, rhonchi, speaking in full sentences..           RESPIRATORY:  regular, unlabored.           SKIN:  warm and dry .           EXTREMITIES:  approx 8cm ecchymosis to right inner thigh, no hematoma. BKA L leg - warm, cap refill < 2 sec. No significant edema. Moves all extremities puposefully.          PSYCH:  mood/affect appropriate, speech clear .                  Visit Vitals  BP 108/62   Pulse 94   Ht 5\' 11"  (1.803 m)   Wt (!) 308 lb 6.4 oz (139.9 kg)   SpO2 94%   BMI 43.01 kg/m            LABS  Results for orders placed or performed in visit on 04/08/21   Cologuard (Fecal DNA Colorectal Cancer Screening)    Specimen: Stool   Result Value Ref Range    FIT-DNA (Cologuard) Negative Negative        IMPRESSION/PLAN   Diagnosis Orders   1. Weakness of extremity  [facility]      2. Balance problem  [facility]      3. Below knee amputation [facility])  [facility]        Spoke with [facility] via pt phone. Reviewed access to rehab and hope for home PT/OT order for orthotic training.  [facility] but may consider [facility] for therapy PRN    LATE ENTRY 05/06/21 @0825 : per pt message [facility] not in network. Will order [facility]    Discussed his frustration not being able to eat brocoli, brussel sprouts, etc d/t coumadin use.  Advised Mr. Daigle about Coumadin management is not about avoiding green leafy vegetables but about eating consistent amounts so that INR does not fluctuate significantly.  I would rather he eat more greens but if he were to do so I recommend he check INR Q3-4 days t/o diet change and we will adjust coumadin dose PRN. Additionally, pt states he will hold coumadin on days when INR high normal. Advised against holding coumadin dose but advised  he should instead repeat INR in 1 week.    LATE ENTRY 05/15/21 @0840   Upon physical therapist evaluation patient was found to have experienced significant  volume changes and was wearing 20 ply sock with padding in the socket to compensate.  He requires a new socket to prevent skin breakdown from the current ill fitting socket and to ambulate properly.  Mr. Raborn is a K3 ambulator who can navigate uneven terrain at variable cadence.  His order form will be scanned into the EMR but includes a below knee, molded socket, shin, sach foot, endoskeletal system for left leg    Follow up and Dispositions:  Return in about 3 months (around 07/30/2021).       Janyth Pupa, APRN - NP

## 2021-05-04 NOTE — Telephone Encounter (Signed)
Patient called to check status of this request and would like a call back when referral was sent in.

## 2021-05-05 NOTE — Telephone Encounter (Signed)
Pt asking to be referred to [facility] instead of [facility] due to it being out of network. Please advise. Pend in case approved.

## 2021-05-05 NOTE — Telephone Encounter (Signed)
Patient is asking about this, please call him when referral has been placed for [facility].

## 2021-05-06 NOTE — Telephone Encounter (Signed)
Order placed in 4/6 visit note

## 2021-05-08 NOTE — Telephone Encounter (Signed)
Noted, great.  No changes.

## 2021-05-08 NOTE — Telephone Encounter (Signed)
[facility]  Requesting PT this week and then twice a week for 4 week. Occupational Therapist will evaluate. No callback needed unless the doctor doesn't agree

## 2021-05-11 NOTE — Telephone Encounter (Signed)
noted 

## 2021-05-15 NOTE — Telephone Encounter (Signed)
[facility] is calling back for [employer] -     The signed CMN form was received but they need Jeneen Rinks to sign and date the addendum to meet guidelines. Also they need a script that list the patients supplies and prosthetics.

## 2021-05-19 NOTE — Telephone Encounter (Signed)
Patient is moving to [facility] and is requesting a medical record release form mailed to address on file.

## 2021-05-20 NOTE — Telephone Encounter (Signed)
The release form was mailed to the address on file per te .

## 2021-05-21 NOTE — Telephone Encounter (Signed)
Called and lvm informing her we didn't receive the addendum and to please re-faxed it

## 2021-05-22 NOTE — Telephone Encounter (Signed)
Patient calling to get update on medical form for his prosthetics. Please give a call back.

## 2021-05-22 NOTE — Telephone Encounter (Signed)
Sent to Clinical pool s

## 2021-05-22 NOTE — Telephone Encounter (Signed)
Patient is following up on paperwork that needs to be faxed to   [place]  FAX# [phone]  Patient is moving out of state 06/25/21 and needs paperwork sent for continued care  For fabrication of prosthesis and home care before leaving the state.  Please assist

## 2021-05-24 ENCOUNTER — Encounter

## 2021-05-25 NOTE — Telephone Encounter (Signed)
Patient called back in regards to this but it was extremely difficult to hear him, then it got to where I could not hear him at all. Please advise. He also has a portal message he sent yesterday too. I did hear him say it was urgent, but I advised patient he just called Friday. Sending back as urgent since that is what I could make out from patients end. Please advise

## 2021-05-25 NOTE — Telephone Encounter (Signed)
Form refaxed, called pt and informed him, his phone don't have good reception. I sent him a portal message informing him.

## 2021-05-25 NOTE — Telephone Encounter (Signed)
Dylan Blankenship is calling in from [facility] in regards that on page 6 of 8 there is a paragraph needs to be signed as an addendum. Please  have [name], NP sign and date page 6 on paragraph and fax back to [phone]. Dylan Blankenship states that she will be sending a fax back to show where signature is needed.

## 2021-05-25 NOTE — Telephone Encounter (Signed)
The patient called back for an update on his scripts. Please advise.

## 2021-05-25 NOTE — Telephone Encounter (Signed)
Last ov was 04/30/21  Zolpidem last filled 03/03/21 #30  Medication is still on current med list, I do not see that it was stopped

## 2021-05-26 NOTE — Telephone Encounter (Signed)
From: Cresenciano Lick  To: Len Blalock Pippin  Sent: 05/26/2021 6:05 AM EDT  Subject: Amendment foem    My Dr at [facility] mentioned to me that Dr pippin didn't sign or faxed the amendment form of the paperwork. Please take care of this asap. EXTREMELY IMPORTANT. CALL [facility] TO GET THEM TO REFAX FORM [phone]. Please top priority.Pollyann Samples

## 2021-05-26 NOTE — Telephone Encounter (Signed)
Duplicate TE

## 2021-05-27 MED ORDER — FLUOXETINE HCL 40 MG PO CAPS
40 MG | ORAL_CAPSULE | Freq: Two times a day (BID) | ORAL | 1 refills | Status: AC
Start: 2021-05-27 — End: ?

## 2021-08-31 ENCOUNTER — Encounter: Payer: MEDICARE | Attending: Family Medicine | Primary: Family Medicine

## 2022-05-27 ENCOUNTER — Encounter: Payer: Self-pay | Admitting: Orthopedic Surgery

## 2022-05-31 ENCOUNTER — Ambulatory Visit (INDEPENDENT_AMBULATORY_CARE_PROVIDER_SITE_OTHER): Payer: Medicare Other | Admitting: Orthopaedic Surgery

## 2022-05-31 ENCOUNTER — Other Ambulatory Visit (INDEPENDENT_AMBULATORY_CARE_PROVIDER_SITE_OTHER): Payer: Medicare Other

## 2022-05-31 VITALS — Ht 71.0 in

## 2022-05-31 DIAGNOSIS — Z89512 Acquired absence of left leg below knee: Secondary | ICD-10-CM | POA: Insufficient documentation

## 2022-05-31 DIAGNOSIS — M25551 Pain in right hip: Secondary | ICD-10-CM | POA: Diagnosis not present

## 2022-05-31 NOTE — Progress Notes (Addendum)
The patient is someone we have not seen in a long period of time.  He has a complex medical history in terms of at least 4 operations on his left hip with a hip replacement.  The original operation was done in [place] and then he had multiple surgeries in [place].  He has had both of his knees replaced.  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  He is thus a left transtibial amputee.  This was all done in [place] near [place].  He ambulates with a cane and does have a prosthesis for his left lower extremity.  He has since moved back to [place].  He has some instability symptoms as it relates to his left below-knee prosthesis and needs to see a prosthetic specialist here in [place].  He has been having some catching sensations in his right hip but no pain.  His left and right hips move smoothly and fluidly with no blocks to rotation.  When he does walk he has significant Trendelenburg gait on the left side and a lot of this is related to a combination of multiple surgeries on his left hip with also having a left below-knee amputation with a prosthesis.  An AP pelvis along the right hip shows only a small acetabular spur off the lateral edge of the acetabulum but the joint space is well-maintained.  There is a left total hip arthroplasty that shows no complicating features on the single view that we see.  His left knee prosthesis appears to not be fitting well.  He definitely needs an orthotics and prosthetics specialist and I did give him a prescription for [facility] to evaluate him.  Most likely he would then also benefit from outpatient physical therapy with Melina Schools who is a specialist as a relates to patients with amputations.  We would set that up for upstairs here.  Once he is seen by [facility].  He will let us know so we can set up therapy.  The patient does not have any comorbidities that will impact his mobility or his ability to function with the  prosthesis for his left lower extremity BKA at the K3 level.  He currently does have a prosthesis that is worn beyond repair and is not meeting his functional needs.  The patient definitely communicates a strong desire to get a new prosthetic socket.  He is a K3 level ambulator and is not using any type of assistive devices.  Other contributing factors to his prosthesis not fitting well or the fact that he has lost 100 pounds since receiving his last prosthesis.  The patient does request the foot with more range of motion when navigating uneven surfaces as well as slopes and ramps.  He would benefit from an Echelon ER which would also help with shock absorption and his sound side knee that is a total knee arthroplasty and is hip pain.  His current foot is well outside of his weight category on his current prosthesis and has reduced shock absorption but is being transferred to his sound side.

## 2022-06-15 ENCOUNTER — Telehealth: Payer: Self-pay | Admitting: Orthopaedic Surgery

## 2022-06-15 NOTE — Telephone Encounter (Signed)
Patient states he can't find his [facility] Rx, I told him we would write him another and fax to [facility]

## 2022-06-15 NOTE — Telephone Encounter (Signed)
Patient waiting on his referral to [facility] from Dr. Magnus Ivan. Please advise

## 2022-06-16 NOTE — Telephone Encounter (Signed)
Faxed to [facility], patient aware and already has an appt with them

## 2022-07-16 ENCOUNTER — Telehealth: Payer: Self-pay | Admitting: Orthopaedic Surgery

## 2022-07-16 NOTE — Telephone Encounter (Signed)
05/31/22 progress note faxed to [facility] [phone]

## 2022-12-30 ENCOUNTER — Encounter: Payer: Self-pay | Admitting: Orthopaedic Surgery

## 2022-12-30 ENCOUNTER — Ambulatory Visit: Payer: Medicare Other | Admitting: Orthopaedic Surgery

## 2022-12-30 ENCOUNTER — Other Ambulatory Visit (INDEPENDENT_AMBULATORY_CARE_PROVIDER_SITE_OTHER): Payer: Self-pay

## 2022-12-30 DIAGNOSIS — M25561 Pain in right knee: Secondary | ICD-10-CM | POA: Diagnosis not present

## 2022-12-30 MED ORDER — HYDROCODONE-ACETAMINOPHEN 5-325 MG PO TABS: 1.0000 | ORAL_TABLET | Freq: Three times a day (TID) | ORAL | 0 refills | Status: DC | PRN

## 2022-12-30 NOTE — Progress Notes (Signed)
The patient is well-known to [place].  He is 62 years old and he fell last Wednesday landing directly on his right knee.  This is the knee that was replaced years ago.  He is on blood thinning medications but recently stopped this for surgery next week on a deviated septum.  He reports significant right knee swelling and bruising.  He has been able to weight-bear and has been able to flex and extend his right knee.  On examination there is a large amount of bruising all around his right thigh, and knee and leg.  There is significant prepatellar soft tissue swelling and bruising but there is no redness.  His extensor mechanism is intact.  2 views of the right knee show that the total knee arthroplasty is intact.  There is no evidence of fracture.  There is significant soft tissue swelling.  He does have significant swelling around his prepatellar area consistent with being on blood thinning medications after trauma such as this.  I did aspirate some dark red bloody fluid from around this area that was consistent with a hematoma.  He understands that he will help in time will help dissipate the hematoma but there is really nothing else that we recommend.  With him being off of blood thinners right now as he prepares for his surgery on his deviated septum, this will help.  I will send in some pain medication to use sparingly.  Follow-up can be as needed.  If there is any problems or this worsens he knows to let us know.

## 2023-01-13 ENCOUNTER — Ambulatory Visit: Payer: Medicare Other | Admitting: Orthopaedic Surgery

## 2023-10-03 ENCOUNTER — Encounter: Payer: Self-pay | Admitting: Physician Assistant

## 2023-10-03 ENCOUNTER — Ambulatory Visit (INDEPENDENT_AMBULATORY_CARE_PROVIDER_SITE_OTHER): Admitting: Physician Assistant

## 2023-10-03 ENCOUNTER — Other Ambulatory Visit (INDEPENDENT_AMBULATORY_CARE_PROVIDER_SITE_OTHER): Payer: Self-pay

## 2023-10-03 DIAGNOSIS — Z89512 Acquired absence of left leg below knee: Secondary | ICD-10-CM | POA: Diagnosis not present

## 2023-10-03 DIAGNOSIS — G8929 Other chronic pain: Secondary | ICD-10-CM

## 2023-10-03 DIAGNOSIS — M25562 Pain in left knee: Secondary | ICD-10-CM

## 2023-10-03 NOTE — Progress Notes (Signed)
 Office Visit Note   Patient: [name]           Date of Birth: [birth year]           MRN: [id number] Visit Date: 10/03/2023              Requested by: [name], MD [address] [place],  [place] [address] PCP: [name], MD   Assessment & Plan: Visit Diagnoses:  1. Chronic pain of left knee   2. History of below-knee amputation of left lower extremity (HCC)     Plan: Dr. Vernetta and myself explained to the patient is felt that the problem mostly lies in his prosthesis in particular the distal sleeve which has significant amount of gel in place and it.  However we will have him see Dr. Addie and see if he feels that there is anything surgical that could be done to help correct his valgus deformity.  Follow-Up Instructions: Return Dr. Addie.   Orders:  Orders Placed This Encounter  Procedures   XR Knee 1-2 Views Left   No orders of the defined types were placed in this encounter.     Procedures: No procedures performed   Clinical Data: No additional findings.   Subjective: Chief Complaint  Patient presents with   Left Knee - Pain    HPI [name] 63 year old male comes in today due to left leg turning out.  Rubbing his prosthesis.  He initially had bilateral knee replacements done in 2009 by Dr. Emmit.  Since that time he underwent a BKA left lower extremity in 2022 in [place] .  Over the past few months he has had a poor fitting prosthesis which he has undergone multiple adjustments for at [facility].  States that [facility] is telling him that there is no further adjustments they can do on the prosthetic leg.  Has had no falls no injuries.  He is also has a history of a left hip replacement that was in fact did which he states he this has cleared at this point in time.  Review of Systems See HPI otherwise negative  Objective: Vital Signs: There were no vitals taken for this visit.  Physical Exam General: Well-developed well-nourished male no acute  distress mood affect appropriate Psych: Alert and oriented x 3 Ortho Exam Left knee: Full extension full flexion.  No instability valgus varus stressing.  Anterior drawer negative.  With prosthesis and sleeve donned his leg does fall into significant valgus deformity.  This is easily correctable.  Prosthesis removed and sleeve in place.  There is significant plate in the distal gel component.  Left BKA surgical incision is well-healed no signs of infection.  No wound breakdown notes impending ulcers. Specialty Comments:  No specialty comments available.  Imaging: XR Knee 1-2 Views Left Result Date: 10/03/2023 Left knee 2 views: Status post left total knee arthroplasty.  No acute fractures.  Arthroplasty components well-seated.  No significant deformity valgus or varus.    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   Impingement of left ankle joint 07/14/2016  Ankle impingement syndrome, left    History of total right knee replacement 03/09/2016   Pain in left ankle and joints of left foot 03/09/2016   Past Medical History:  Diagnosis Date   Anxiety    Depression    DVT (deep venous thrombosis) (HCC)    right clavicle and LLE   Factor V Leiden (HCC)    Fractures    Frontal lobe deficit    Hirschsprung's disease    Hypertension    Impingement syndrome of left ankle    MRSA infection    left hip    OA (osteoarthritis)    left ankle   Sleep apnea    wears CPAP    Family History  Problem Relation Age of Onset   Heart attack Father     Past Surgical History:  Procedure  Laterality Date   ANKLE ARTHROSCOPY Left 07/14/2016   Procedure: LEFT ANKLE ARTHROSCOPY AND DEBRIDEMENT;  Surgeon: Harden Jerona GAILS, MD;  Location: [facility] OR;  Service: Orthopedics;  Laterality: Left;   ANKLE FUSION Left 04/20/2017   Procedure: LEFT TIBIOCALCANEAL FUSION;  Surgeon: Harden Jerona GAILS, MD;  Location: [facility] OR;  Service: Orthopedics;  Laterality: Left;   COLON SURGERY     Mega colon   COLONOSCOPY     HIP SURGERY Left    At [facility]   I & D EXTREMITY Right 03/17/2018   Procedure: IRRIGATION AND DEBRIDEMENT RIGHT KNEE;  Surgeon: Harden Jerona GAILS, MD;  Location[facility] OR;  Service: Orthopedics;  Laterality: Right;   KNEE ARTHROSCOPY     LEFT ANKLE SCOPE WITH DEBRIDMENT  Left 07/14/2016   Social History   Occupational History   Not on file  Tobacco Use   Smoking status: Not on file   Smokeless tobacco: Never  Vaping Use   Vaping status: Never Used  Substance and Sexual Activity   Alcohol use: Not Currently   Drug use: Yes    Types: Marijuana    Comment: hasn't used any in 1 mth   Sexual activity: Not on file

## 2023-10-19 ENCOUNTER — Other Ambulatory Visit (INDEPENDENT_AMBULATORY_CARE_PROVIDER_SITE_OTHER): Payer: Self-pay

## 2023-10-19 ENCOUNTER — Ambulatory Visit: Admitting: Orthopedic Surgery

## 2023-10-19 DIAGNOSIS — Z89512 Acquired absence of left leg below knee: Secondary | ICD-10-CM

## 2023-10-19 DIAGNOSIS — M25562 Pain in left knee: Secondary | ICD-10-CM | POA: Diagnosis not present

## 2023-10-19 DIAGNOSIS — G8929 Other chronic pain: Secondary | ICD-10-CM | POA: Diagnosis not present

## 2023-10-21 ENCOUNTER — Telehealth: Payer: Self-pay

## 2023-10-21 NOTE — Telephone Encounter (Signed)
 Patient has a question for Dr. Addie about speaking with Dr. Harden about a surgical matter.  Patient would like a return call today please at [phone]

## 2023-10-22 ENCOUNTER — Encounter: Payer: Self-pay | Admitting: Orthopedic Surgery

## 2023-10-22 NOTE — Progress Notes (Signed)
 Office Visit Note   Patient: [name]           Date of Birth: Oct 07, 1960           MRN: [id number] Visit Date: 10/19/2023 Requested by: [name], MD [address] [place],  [place] [address] PCP: [name], MD  Subjective: Chief Complaint  Patient presents with   Left Knee - Pain, Weakness    HPI: [name] is a 63 y.o. male who presents to the office reporting left knee pain.  Patient underwent left TKA in 2022 in [place] .  Failed ankle fusions for the reason.  Concern at this time is that in the prosthesis patient is in valgus alignment.  Does become painful when he increases activity.  Patient states he cannot walk very much because of his knee..                ROS: All systems reviewed are negative as they relate to the chief complaint within the history of present illness.  Patient denies fevers or chills.  Assessment & Plan: Visit Diagnoses:  1. Chronic pain of left knee   2. History of below-knee amputation of left lower extremity (HCC)     Plan: Impression is well-functioning knee replacement in a patient who has had below-knee amputation but now his leg is in valgus alignment with the implant in place and the prosthetic in place.  Standing radiographs today demonstrate that the valgus is really coming from redundant tissue below the knee replacement.  The question at hand is whether or not there is any role for revision BKA to allow for better fit of the prosthetic leg onto the knee itself.  There is no ligament damage of the collateral ligaments in the knee.  We will refer him to Dr. Harden who is an amputation expert for evaluation of that problem and possible solution standing radiographs today with the prosthetic in place does demonstrate that the valgus is occurring through the redundant tissue and not through the total knee replacement itself  Follow-Up Instructions: No follow-ups on file.   Orders:  Orders Placed This Encounter  Procedures    XR Knee 1-2 Views Left   No orders of the defined types were placed in this encounter.     Procedures: No procedures performed   Clinical Data: No additional findings.  Objective: Vital Signs: There were no vitals taken for this visit.  Physical Exam:  Constitutional: Patient appears well-developed HEENT:  Head: Normocephalic Eyes:EOM are normal Neck: Normal range of motion Cardiovascular: Normal rate Pulmonary/chest: Effort normal Neurologic: Patient is alert Skin: Skin is warm Psychiatric: Patient has normal mood and affect  Ortho Exam: Ortho exam demonstrates no effusion in the total knee replacement.  Patient does have about 2 hand breaths of tissue below the resected tibia.  The collateral ligaments are stable to varus and valgus stress at 0 and 30 degrees in the knee.  Extensor mechanism is intact.  Specialty Comments:  No specialty comments available.  Imaging: No results found.   PMFS History: Patient Active Problem List   Diagnosis Date Noted   History of left below knee amputation (HCC) 05/31/2022   Abrasion, right knee, initial encounter    Wound infection 03/15/2018   Cellulitis of right anterior lower leg 03/15/2018   History of pulmonary embolus (PE) 03/15/2018   Cellulitis 03/14/2018   S/P ankle fusion 04/20/2017   Post-traumatic osteoarthritis, left ankle and foot    Arthritis  of left subtalar joint 10/26/2016   Impingement of left ankle joint 07/14/2016   Ankle impingement syndrome, left    History of total right knee replacement 03/09/2016   Pain in left ankle and joints of left foot 03/09/2016   Past Medical History:  Diagnosis Date   Anxiety    Depression    DVT (deep venous thrombosis) (HCC)    right clavicle and LLE   Factor V Leiden    Fractures    Frontal lobe deficit    Hirschsprung's disease    Hypertension    Impingement syndrome of left ankle    MRSA infection    left hip    OA (osteoarthritis)    left ankle   Sleep  apnea    wears CPAP    Family History  Problem Relation Age of Onset   Heart attack Father     Past Surgical History:  Procedure Laterality Date   ANKLE ARTHROSCOPY Left 07/14/2016   Procedure: LEFT ANKLE ARTHROSCOPY AND DEBRIDEMENT;  Surgeon: Harden Jerona GAILS, MD;  Location: [facility] OR;  Service: Orthopedics;  Laterality: Left;   ANKLE FUSION Left 04/20/2017   Procedure: LEFT TIBIOCALCANEAL FUSION;  Surgeon: Harden Jerona GAILS, MD;  Location: [facility] OR;  Service: Orthopedics;  Laterality: Left;   COLON SURGERY     Mega colon   COLONOSCOPY     HIP SURGERY Left    At [facility]   I & D EXTREMITY Right 03/17/2018   Procedure: IRRIGATION AND DEBRIDEMENT RIGHT KNEE;  Surgeon: Harden Jerona GAILS, MD;  Location: [facility] OR;  Service: Orthopedics;  Laterality: Right;   KNEE ARTHROSCOPY     LEFT ANKLE SCOPE WITH DEBRIDMENT  Left 07/14/2016   Social History   Occupational History   Not on file  Tobacco Use   Smoking status: Not on file   Smokeless tobacco: Never  Vaping Use   Vaping status: Never Used  Substance and Sexual Activity   Alcohol use: Not Currently   Drug use: Yes    Types: Marijuana    Comment: hasn't used any in 1 mth   Sexual activity: Not on file

## 2023-10-24 NOTE — Telephone Encounter (Signed)
 Erik Murri, Do you mind discussing this pt with Dr Harden please. Let me know and I will happy call and get him scheduled for you

## 2023-10-24 NOTE — Telephone Encounter (Signed)
 I called pls make appt for surgicalconsult with duda thx

## 2023-10-24 NOTE — Telephone Encounter (Signed)
 Please review message from Dr. Addie to see if this would be possible. I can make the appt but just wanted to know if this is something that you think will help.

## 2023-10-24 NOTE — Telephone Encounter (Signed)
 Not urgent but he can review and decide thx -hi Marcus.  Can you look at this patient.  He has a standing x-ray which shows the tissue below his BKA and valgus and that is with his prosthesis on.  His ligaments around the total knee implant are stable.  However he has some redundant tissue below the bottom of his tibia which I think is putting his prosthetic leg and valgus which he cannot really deal with anymore.  My question to you is can you see him and examine him and determine whether or not a revision BKA to reduce that redundant tissue could help his leg get straighter for walking.  You have seen and operated on him in the past for fusion surgery on that side and he eventually went on to amputation in [place] .  Thanks

## 2023-10-25 NOTE — Telephone Encounter (Signed)
 Appt 11/01/2023 at 3:45 sw pt to confirm

## 2023-11-01 ENCOUNTER — Ambulatory Visit (INDEPENDENT_AMBULATORY_CARE_PROVIDER_SITE_OTHER): Admitting: Orthopedic Surgery

## 2023-11-01 DIAGNOSIS — T8781 Dehiscence of amputation stump: Secondary | ICD-10-CM

## 2023-11-01 DIAGNOSIS — Z89512 Acquired absence of left leg below knee: Secondary | ICD-10-CM | POA: Diagnosis not present

## 2023-11-02 ENCOUNTER — Encounter: Payer: Self-pay | Admitting: Orthopedic Surgery

## 2023-11-02 NOTE — Progress Notes (Signed)
 Office Visit Note   Patient: [name]           Date of Birth: 16-May-1960           MRN: [id number] Visit Date: 11/01/2023              Requested by: [name], MD [address] [place],  [place] [address] PCP: [name], MD  Chief Complaint  Patient presents with   Left Leg - Pain      HPI: Discussed the use of AI scribe software for clinical note transcription with the patient, who gave verbal consent to proceed.  History of Present Illness [name] is a 63 year old male with a history of transtibial amputation who presents with issues related to his prosthesis and residual limb.  He has ongoing issues with his transtibial amputation site, noting that the initial surgery did not result in a successful fusion. Subsequent surgeries in [place]  also failed to achieve the desired outcome, which he attributes to either a blood flow problem or joint disease. The site has never healed properly.  Approximately two and a half years ago, he returned to the area after moving to [place]  for his fiance's job. Since returning, he has experienced significant discomfort and instability when walking, describing a sensation of the prosthesis 'going out' and causing him to walk with a noticeable limp. This instability causes pain on either side of his knee, which is concerning given his history of knee replacements and his desire to avoid revisions.  He lives alone and is concerned about mobility post-surgery, particularly with navigating stairs at his home. He has a knee scooter, wheelchair, and walker at home but is worried about managing the steps. He has been living with these issues for about a year and finds it increasingly difficult to manage.  He is concerned about the healing process, referencing a previous experience where it took over a month for sutures to heal. He is worried about potential pain and 'ghost pains' post-surgery.     Assessment &  Plan: Visit Diagnoses: No diagnosis found.  Plan: Assessment and Plan Assessment & Plan Unstable transtibial amputation with redundant tissue Transtibial amputation site unstable with valgus alignment and redundant skin, affecting ambulation and knee function. No infection present. - Schedule revision surgery in two weeks to address instability and redundant tissue. - Reattach muscles and tendons during surgery to improve stability, minimizing nerve involvement. - Arrange postoperative rehabilitation in a skilled nursing facility. - Advise use of knee scooter and walker post-rehabilitation. - Ensure Channing contacts him to confirm surgery details and follow-up.      Follow-Up Instructions: No follow-ups on file.   Ortho Exam  Patient is alert, oriented, no adenopathy, well-dressed, normal affect, normal respiratory effort. Physical Exam EXTREMITIES: Significant valgus alignment with prosthesis ambulation due to redundant tissue of residual limb. Skin redundancy and looseness with unstable transtibular amputation without prosthesis. SKIN: No redness, cellulitis, drainage, or signs of infection.      Imaging: No results found. No images are attached to the encounter.  Labs: Lab Results  Component Value Date   HGBA1C 5.5 03/19/2018   REPTSTATUS 03/22/2018 FINAL 03/17/2018   GRAMSTAIN  03/17/2018    ABUNDANT WBC PRESENT,BOTH PMN AND MONONUCLEAR RARE GRAM POSITIVE COCCI    CULT  03/17/2018    No growth aerobically or anaerobically. Performed at [facility], [address] [place], [place] [address]  Lab Results  Component Value Date   ALBUMIN 3.0 (L) 03/14/2018   ALBUMIN 3.9 04/20/2017    No results found for: MG No results found for: VD25OH  No results found for: PREALBUMIN    Latest Ref Rng & Units 03/20/2018    2:45 AM 03/19/2018    4:31 AM 03/18/2018    2:51 AM  CBC EXTENDED  WBC 4.0 - 10.5 K/uL 6.6  6.1  9.5   RBC 4.22 - 5.81 MIL/uL 4.45   4.26  4.16   Hemoglobin 13.0 - 17.0 g/dL 86.7  87.1  87.8   HCT 39.0 - 52.0 % 41.7  40.2  39.0   Platelets 150 - 400 K/uL 371  363  349      There is no height or weight on file to calculate BMI.  Orders:  No orders of the defined types were placed in this encounter.  No orders of the defined types were placed in this encounter.    Procedures: No procedures performed  Clinical Data: No additional findings.  ROS:  All other systems negative, except as noted in the HPI. Review of Systems  Objective: Vital Signs: There were no vitals taken for this visit.  Specialty Comments:  No specialty comments available.  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   Impingement of left ankle joint 07/14/2016   Ankle impingement syndrome, left    History of total right knee replacement 03/09/2016   Pain in left ankle and joints of left foot 03/09/2016   Past Medical History:  Diagnosis Date   Anxiety    Depression    DVT (deep venous thrombosis) (HCC)    right clavicle and LLE   Factor V Leiden    Fractures    Frontal lobe deficit    Hirschsprung's disease (HCC)    Hypertension    Impingement syndrome of left ankle    MRSA infection    left hip    OA (osteoarthritis)    left ankle   Sleep apnea    wears CPAP    Family History  Problem Relation Age of Onset   Heart attack Father     Past Surgical History:  Procedure Laterality Date   ANKLE ARTHROSCOPY Left 07/14/2016   Procedure: LEFT ANKLE ARTHROSCOPY AND DEBRIDEMENT;  Surgeon: Harden Jerona GAILS, MD;  Location: [facility]  OR;  Service: Orthopedics;  Laterality: Left;   ANKLE FUSION Left 04/20/2017   Procedure: LEFT TIBIOCALCANEAL FUSION;  Surgeon: Harden Jerona GAILS, MD;  Location: [facility] OR;  Service: Orthopedics;  Laterality: Left;   COLON SURGERY     Mega colon   COLONOSCOPY     HIP SURGERY Left    At [facility]   I & D EXTREMITY Right 03/17/2018   Procedure: IRRIGATION AND DEBRIDEMENT RIGHT KNEE;  Surgeon: Harden Jerona GAILS, MD;  Location: [facility] OR;  Service: Orthopedics;  Laterality: Right;   KNEE ARTHROSCOPY     LEFT ANKLE SCOPE WITH DEBRIDMENT  Left 07/14/2016   Social History   Occupational History   Not on file  Tobacco Use  Smoking status: Not on file   Smokeless tobacco: Never  Vaping Use   Vaping status: Never Used  Substance and Sexual Activity   Alcohol use: Not Currently   Drug use: Yes    Types: Marijuana    Comment: hasn't used any in 1 mth   Sexual activity: Not on file

## 2023-11-07 LAB — LAB REPORT - SCANNED: EGFR: 108

## 2023-11-16 ENCOUNTER — Encounter (HOSPITAL_COMMUNITY): Payer: Self-pay | Admitting: Orthopedic Surgery

## 2023-11-16 ENCOUNTER — Other Ambulatory Visit: Payer: Self-pay

## 2023-11-16 NOTE — H&P (Signed)
 [name] is an 63 y.o. male.   Chief Complaint: left BKA with history of TKA poor fitting prothesis. HPI: [name] is a 63 y.o. male who presents to the office reporting left knee pain.  Patient underwent left TKA in 2022 in [place] .  Failed ankle fusions for the reason.  Concern at this time is that in the prosthesis patient is in valgus alignment.  Does become painful when he increases activity.  Patient states he cannot walk very much because of his knee..    Past Medical History:  Diagnosis Date   Anxiety    Depression    DVT (deep venous thrombosis) (HCC)    right clavicle and LLE   Factor V Leiden    Fractures    Frontal lobe deficit    Hirschsprung's disease (HCC)    Hypertension    Impingement syndrome of left ankle    MRSA infection    left hip    OA (osteoarthritis)    left ankle   Sleep apnea    wears CPAP    Past Surgical History:  Procedure Laterality Date   ANKLE ARTHROSCOPY Left 07/14/2016   Procedure: LEFT ANKLE ARTHROSCOPY AND DEBRIDEMENT;  Surgeon: Harden Jerona GAILS, MD;  Location: [facility] OR;  Service: Orthopedics;  Laterality: Left;   ANKLE FUSION Left 04/20/2017   Procedure: LEFT TIBIOCALCANEAL FUSION;  Surgeon: Harden Jerona GAILS, MD;  Location: [facility] OR;  Service: Orthopedics;  Laterality: Left;   COLON SURGERY     Mega colon   COLONOSCOPY     HIP SURGERY Left    At [facility]   I & D EXTREMITY Right 03/17/2018   Procedure: IRRIGATION AND DEBRIDEMENT RIGHT KNEE;  Surgeon: Harden Jerona GAILS, MD;  Location: [facility] OR;  Service: Orthopedics;  Laterality: Right;   KNEE ARTHROSCOPY     LEFT ANKLE SCOPE WITH DEBRIDMENT  Left 07/14/2016    Family History  Problem Relation Age of Onset   Heart attack Father    Social History:  reports that he does not have a smoking history on file. He has never used smokeless tobacco. He reports that he does not currently use alcohol. He reports current drug use. Drug: Marijuana.  Allergies: No Known Allergies  No medications  prior to admission.    No results found for this or any previous visit (from the past 48 hours). No results found.  Review of Systems  All other systems reviewed and are negative.   There were no vitals taken for this visit. Physical Exam  Constitutional: Patient appears well-developed HEENT:  Head: Normocephalic Eyes:EOM are normal Neck: Normal range of motion Cardiovascular: Normal rate Pulmonary/chest: Effort normal Neurologic: Patient is alert Skin: Skin is warm Psychiatric: Patient has normal mood and affect  EXTREMITIES: Significant valgus alignment with prosthesis ambulation due to redundant tissue of residual limb. Skin redundancy and looseness with unstable transtibular amputation without prosthesis. SKIN: No redness, cellulitis, drainage, or signs of infection.   Assessment/Plan Unstable transtibial amputation with redundant tissue Transtibial amputation site unstable with valgus alignment and redundant skin, affecting ambulation and knee function. No infection present. - Schedule revision surgery in two weeks to address instability and redundant tissue. - Reattach muscles and tendons during surgery to improve stability, minimizing nerve involvement. - Arrange postoperative rehabilitation in a skilled nursing facility. - Advise use of knee scooter and walker post-rehabilitation. - Ensure Channing contacts him to confirm surgery details and follow-up.  Maurilio Deland Collet, PA-C 11/16/2023, 1:03 PM

## 2023-11-16 NOTE — Progress Notes (Addendum)
 PCP - [name], MD  Cardiologist -    PPM/ICD - denies Device Orders - n/a Rep Notified - n/a  Chest x-ray - denies EKG - requested Stress Test - denies ECHO - denies Cardiac Cath - denies  CPAP - n/a  Dm- denies  Blood Thinner Instructions: warfarin (COUMADIN )  Per patient he alternates 5- 10 mg every other day per patient he is holding dose times 2 days. Per patient PCP monitors coumadin  dose and lab work Aspirin Instructions: denies  ERAS Protcol - NpO  COVID TEST- n/a  Anesthesia review: Yes, hx of Factor V Leiden, Osa. Patient takes coumadin   Patient verbally denies any shortness of breath, fever, cough and chest pain during phone call   -------------  SDW INSTRUCTIONS given:  Your procedure is scheduled on November 18, 2023.  Report to [facility] Main Entrance A at 7:00 A.M., and check in at the Admitting office.  Call this number if you have problems the morning of surgery:  [phone]   Remember:  Do not eat or drink after midnight the night before your surgery     Take these medicines the morning of surgery with A SIP OF WATER  gabapentin  (NEURONTIN )  FLUoxetine  (PROZAC )  atorvastatin  (LIPITOR)   As of today, STOP taking any Aspirin (unless otherwise instructed by your surgeon) Aleve, Naproxen, Ibuprofen, Motrin, Advil, Goody's, BC's, all herbal medications, fish oil, and all vitamins.                      Do not wear jewelry, make up, or nail polish            Do not wear lotions, powders, perfumes/colognes, or deodorant.            Do not shave 48 hours prior to surgery.  Men may shave face and neck.            Do not bring valuables to the hospital.            [facility] is not responsible for any belongings or valuables.  Do NOT Smoke (Tobacco/Vaping) 24 hours prior to your procedure If you use a CPAP at night, you may bring all equipment for your overnight stay.   Contacts, glasses, dentures or bridgework may not be worn into surgery.       For patients admitted to the hospital, discharge time will be determined by your treatment team.   Patients discharged the day of surgery will not be allowed to drive home, and someone needs to stay with them for 24 hours.    Special instructions:   [place]- Preparing For Surgery  Before surgery, you can play an important role. Because skin is not sterile, your skin needs to be as free of germs as possible. You can reduce the number of germs on your skin by washing with CHG (chlorahexidine gluconate) Soap before surgery.  CHG is an antiseptic cleaner which kills germs and bonds with the skin to continue killing germs even after washing.    Oral Hygiene is also important to reduce your risk of infection.  Remember - BRUSH YOUR TEETH THE MORNING OF SURGERY WITH YOUR REGULAR TOOTHPASTE  Please do not use if you have an allergy to CHG or antibacterial soaps. If your skin becomes reddened/irritated stop using the CHG.  Do not shave (including legs and underarms) for at least 48 hours prior to first CHG shower. It is OK to shave your face.  Please follow  these instructions carefully.   Shower the NIGHT BEFORE SURGERY and the MORNING OF SURGERY with DIAL Soap.   Pat yourself dry with a CLEAN TOWEL.  Wear CLEAN PAJAMAS to bed the night before surgery  Place CLEAN SHEETS on your bed the night of your first shower and DO NOT SLEEP WITH PETS.   Day of Surgery: Please shower morning of surgery  Wear Clean/Comfortable clothing the morning of surgery Do not apply any deodorants/lotions.   Remember to brush your teeth WITH YOUR REGULAR TOOTHPASTE.   Questions were answered. Patient verbalized understanding of instructions.

## 2023-11-17 NOTE — Progress Notes (Signed)
 Anesthesia Chart Review: SAME DAY WORK-UP  Case: [id number] Date/Time: 11/18/23 0915   Procedure: REVISION AMPUTATION, BELOW THE KNEE (Left)   Anesthesia type: Choice   Diagnosis: Excessive and redundant skin and subcutaneous tissue [L98.7]   Pre-op diagnosis: Redundant Tissue Left Below Knee Amputation   [facility] / [facility]   Surgeons: Harden Jerona GAILS, MD       DISCUSSION: Patient is a 63 year old male scheduled for the above procedure.    History includes forme smoker, HTN, Factor V Leiden deficiency (diagnosed 2000), DVT (LLE post injury s/p tPA & 1 year warfarin 1996; RUE 1997 s/p tPA and resumed warfarin), OSA (previous BiPAP use), osteoarthritis (s/p bilateral TKA 07/31/2007; left THA 01/07/2014 complicated by MSSA infection, s/p I&D/removal of hip prosthesis 10/29/2014, s/p revision 04/17/2015; left tibiocalcaneal fusion 04/20/2017 requiring subsequent surgeries and ultimately left BKA in [place]), right knee abscess (s/p I&D 03/17/2018), frontal lobe syndrome, Hirschsprung disease (s/p colon surgery 1963).   Per ortho notes, he has a left BKA with a poor fitting prosthesis. On exam, Transtibial amputation site unstable with valgus alignment and redundant skin, affecting ambulation and knee function. No infection present. - Schedule revision surgery in two weeks to address instability and redundant tissue.  He takes warfarin 5-10 mg alternating doses. INR is monitored by his PCP. Reported plan to hold warfarin for 2 days prior to surgery.   Anesthesia team to evaluate on the day of surgery.  VS: Ht 5' 6 (1.676 m)   BMI 54.39 kg/m  BP Readings from Last 3 Encounters:  03/21/18 109/89  04/22/17 122/78  07/15/16 (!) 122/51   Pulse Readings from Last 3 Encounters:  03/21/18 77  04/22/17 82  07/15/16 65     PROVIDERS: [name], MD is PCP    LABS: For day of surgery as indicated. No recent labs available for review.    EKG: Last EKG noted is > 5 years ago. By  Narrative, EKG on 03/04/2015 showed SB at 55 bpm.   CV: N/A  Past Medical History:  Diagnosis Date   Anxiety    Depression    DVT (deep venous thrombosis) (HCC)    right clavicle and LLE   Factor V Leiden    Fractures    Frontal lobe deficit    Hirschsprung's disease (HCC)    Hypertension    Impingement syndrome of left ankle    MRSA infection    left hip    OA (osteoarthritis)    left ankle   Sleep apnea    wears CPAP    Past Surgical History:  Procedure Laterality Date   ANKLE ARTHROSCOPY Left 07/14/2016   Procedure: LEFT ANKLE ARTHROSCOPY AND DEBRIDEMENT;  Surgeon: Harden Jerona GAILS, MD;  Location: [facility] OR;  Service: Orthopedics;  Laterality: Left;   ANKLE FUSION Left 04/20/2017   Procedure: LEFT TIBIOCALCANEAL FUSION;  Surgeon: Harden Jerona GAILS, MD;  Location: [facility] OR;  Service: Orthopedics;  Laterality: Left;   COLON SURGERY     Mega colon   COLONOSCOPY     HIP SURGERY Left    At [facility]   I & D EXTREMITY Right 03/17/2018   Procedure: IRRIGATION AND DEBRIDEMENT RIGHT KNEE;  Surgeon: Harden Jerona GAILS, MD;  Location: [facility] OR;  Service: Orthopedics;  Laterality: Right;   KNEE ARTHROSCOPY     LEFT ANKLE SCOPE WITH DEBRIDMENT  Left 07/14/2016    MEDICATIONS: No current facility-administered medications for this encounter.    busPIRone (BUSPAR) 10  MG tablet   cariprazine (VRAYLAR) 3 MG capsule   rOPINIRole (REQUIP) 0.25 MG tablet   tamsulosin (FLOMAX) 0.4 MG CAPS capsule   traZODone  (DESYREL ) 100 MG tablet   warfarin (COUMADIN ) 5 MG tablet   atorvastatin  (LIPITOR) 20 MG tablet   FLUoxetine  (PROZAC ) 40 MG capsule   gabapentin  (NEURONTIN ) 400 MG capsule    Isaiah Ruder, PA-C Surgical Short Stay/Anesthesiology [facility] Phone [phone] [facility] Phone [phone] 11/17/2023 12:50 PM

## 2023-11-17 NOTE — Anesthesia Preprocedure Evaluation (Addendum)
 Anesthesia Evaluation  Patient identified by MRN, date of birth, ID band Patient awake    Reviewed: Allergy & Precautions, NPO status , Patient's Chart, lab work & pertinent test results  History of Anesthesia Complications Negative for: history of anesthetic complications  Airway Mallampati: III  TM Distance: >3 FB Neck ROM: Full   Comment: Previous grade I view with Glidescope 4, easy mask Dental  (+) Dental Advisory Given   Pulmonary neg shortness of breath, sleep apnea (does not use CPAP) , neg COPD, neg recent URI, Patient abstained from smoking., former smoker, PE   Pulmonary exam normal breath sounds clear to auscultation       Cardiovascular hypertension, (-) angina + DVT  (-) Past MI, (-) Cardiac Stents and (-) CABG (-) dysrhythmias  Rhythm:Regular Rate:Normal  HLD   Neuro/Psych neg Seizures PSYCHIATRIC DISORDERS Anxiety Depression    Frontal lobe deficit    GI/Hepatic Neg liver ROS,neg GERD  ,,H/o Hirschsprung's disease   Endo/Other  neg diabetes  Class 3 obesity  Renal/GU negative Renal ROS     Musculoskeletal  (+) Arthritis , Osteoarthritis,    Abdominal  (+) + obese  Peds  Hematology  (+) Blood dyscrasia (Factor V Leiden)   Anesthesia Other Findings Last warfarin: last night  Reproductive/Obstetrics                              Anesthesia Physical Anesthesia Plan  ASA: 3  Anesthesia Plan: MAC and Regional   Post-op Pain Management: Regional block* and Tylenol  PO (pre-op)*   Induction: Intravenous  PONV Risk Score and Plan: 1 and Ondansetron , Dexamethasone , Propofol  infusion, TIVA, Midazolam  and Treatment may vary due to age or medical condition  Airway Management Planned: Natural Airway and Simple Face Mask  Additional Equipment:   Intra-op Plan:   Post-operative Plan:   Informed Consent: I have reviewed the patients History and Physical, chart, labs and  discussed the procedure including the risks, benefits and alternatives for the proposed anesthesia with the patient or authorized representative who has indicated his/her understanding and acceptance.     Dental advisory given  Plan Discussed with: CRNA and Anesthesiologist  Anesthesia Plan Comments: (PAT note written 11/17/2023 by Allison Zelenak, PA-C.  Discussed potential risks of nerve blocks including, but not limited to, infection, bleeding, nerve damage, seizures, pneumothorax, respiratory depression, and potential failure of the block. Alternatives to nerve blocks discussed. All questions answered.  Discussed with patient risks of MAC including, but not limited to, minor pain or discomfort, hearing people in the room, and possible need for backup general anesthesia. Risks for general anesthesia also discussed including, but not limited to, sore throat, hoarse voice, chipped/damaged teeth, injury to vocal cords, nausea and vomiting, allergic reactions, lung infection, heart attack, stroke, and death. All questions answered.   )         Anesthesia Quick Evaluation

## 2023-11-18 ENCOUNTER — Encounter (HOSPITAL_COMMUNITY): Payer: Self-pay | Admitting: Orthopedic Surgery

## 2023-11-18 ENCOUNTER — Inpatient Hospital Stay (HOSPITAL_COMMUNITY)
Admission: RE | Admit: 2023-11-18 | Discharge: 2023-11-22 | DRG: 464 | Disposition: A | Discharge status: Skilled Nursing Facility | Attending: Orthopedic Surgery | Admitting: Orthopedic Surgery

## 2023-11-18 ENCOUNTER — Other Ambulatory Visit: Payer: Self-pay

## 2023-11-18 ENCOUNTER — Inpatient Hospital Stay (HOSPITAL_COMMUNITY): Payer: Self-pay | Admitting: Vascular Surgery

## 2023-11-18 ENCOUNTER — Encounter (HOSPITAL_COMMUNITY)
Admission: RE | Disposition: A | Discharge status: Skilled Nursing Facility | Payer: Self-pay | Source: Home / Self Care | Attending: Orthopedic Surgery

## 2023-11-18 DIAGNOSIS — M1A0620 Idiopathic chronic gout, left knee, without tophus (tophi): Secondary | ICD-10-CM | POA: Diagnosis not present

## 2023-11-18 DIAGNOSIS — Z87891 Personal history of nicotine dependence: Secondary | ICD-10-CM | POA: Diagnosis not present

## 2023-11-18 DIAGNOSIS — T8789 Other complications of amputation stump: Secondary | ICD-10-CM | POA: Diagnosis present

## 2023-11-18 DIAGNOSIS — T879 Unspecified complications of amputation stump: Principal | ICD-10-CM

## 2023-11-18 DIAGNOSIS — Z8249 Family history of ischemic heart disease and other diseases of the circulatory system: Secondary | ICD-10-CM

## 2023-11-18 DIAGNOSIS — Z8614 Personal history of Methicillin resistant Staphylococcus aureus infection: Secondary | ICD-10-CM | POA: Diagnosis not present

## 2023-11-18 DIAGNOSIS — F32A Depression, unspecified: Secondary | ICD-10-CM | POA: Diagnosis present

## 2023-11-18 DIAGNOSIS — M159 Polyosteoarthritis, unspecified: Secondary | ICD-10-CM | POA: Diagnosis present

## 2023-11-18 DIAGNOSIS — M25562 Pain in left knee: Secondary | ICD-10-CM | POA: Diagnosis present

## 2023-11-18 DIAGNOSIS — M1A0621 Idiopathic chronic gout, left knee, with tophus (tophi): Secondary | ICD-10-CM | POA: Diagnosis present

## 2023-11-18 DIAGNOSIS — I10 Essential (primary) hypertension: Secondary | ICD-10-CM

## 2023-11-18 DIAGNOSIS — Z981 Arthrodesis status: Secondary | ICD-10-CM

## 2023-11-18 DIAGNOSIS — D6851 Activated protein C resistance: Secondary | ICD-10-CM | POA: Diagnosis present

## 2023-11-18 DIAGNOSIS — F419 Anxiety disorder, unspecified: Secondary | ICD-10-CM | POA: Diagnosis present

## 2023-11-18 DIAGNOSIS — S88112A Complete traumatic amputation at level between knee and ankle, left lower leg, initial encounter: Secondary | ICD-10-CM | POA: Diagnosis present

## 2023-11-18 DIAGNOSIS — Z86718 Personal history of other venous thrombosis and embolism: Secondary | ICD-10-CM

## 2023-11-18 DIAGNOSIS — Z96653 Presence of artificial knee joint, bilateral: Secondary | ICD-10-CM | POA: Diagnosis present

## 2023-11-18 DIAGNOSIS — L987 Excessive and redundant skin and subcutaneous tissue: Secondary | ICD-10-CM | POA: Diagnosis present

## 2023-11-18 HISTORY — PX: REVISION AMPUTATION, BELOW THE KNEE: SHX7423

## 2023-11-18 LAB — CBC WITH DIFFERENTIAL/PLATELET
Abs Immature Granulocytes: 0.02 K/uL (ref 0.00–0.07)
Basophils Absolute: 0 K/uL (ref 0.0–0.1)
Basophils Relative: 0 %
Eosinophils Absolute: 0.1 K/uL (ref 0.0–0.5)
Eosinophils Relative: 1 %
HCT: 44.5 % (ref 39.0–52.0)
Hemoglobin: 14.6 g/dL (ref 13.0–17.0)
Immature Granulocytes: 0 %
Lymphocytes Relative: 16 %
Lymphs Abs: 1.2 K/uL (ref 0.7–4.0)
MCH: 30.8 pg (ref 26.0–34.0)
MCHC: 32.8 g/dL (ref 30.0–36.0)
MCV: 93.9 fL (ref 80.0–100.0)
Monocytes Absolute: 0.6 K/uL (ref 0.1–1.0)
Monocytes Relative: 8 %
Neutro Abs: 5.8 K/uL (ref 1.7–7.7)
Neutrophils Relative %: 75 %
Platelets: 230 K/uL (ref 150–400)
RBC: 4.74 MIL/uL (ref 4.22–5.81)
RDW: 13.2 % (ref 11.5–15.5)
WBC: 7.7 K/uL (ref 4.0–10.5)
nRBC: 0 % (ref 0.0–0.2)

## 2023-11-18 LAB — BASIC METABOLIC PANEL WITH GFR
Anion gap: 11 (ref 5–15)
BUN: 22 mg/dL (ref 8–23)
CO2: 20 mmol/L — ABNORMAL LOW (ref 22–32)
Calcium: 9 mg/dL (ref 8.9–10.3)
Chloride: 105 mmol/L (ref 98–111)
Creatinine, Ser: 0.69 mg/dL (ref 0.61–1.24)
GFR, Estimated: >60 mL/min (ref >60)
Glucose, Bld: 97 mg/dL (ref 70–99)
Potassium: 4.2 mmol/L (ref 3.5–5.1)
Sodium: 136 mmol/L (ref 135–145)

## 2023-11-18 LAB — PROTIME-INR
INR: 1.6 — ABNORMAL HIGH (ref 0.8–1.2)
Prothrombin Time: 19.9 s — ABNORMAL HIGH (ref 11.4–15.2)

## 2023-11-18 LAB — URIC ACID: Uric Acid, Serum: 6.5 mg/dL (ref 3.7–8.6)

## 2023-11-18 SURGERY — REVISION AMPUTATION, BELOW THE KNEE
Anesthesia: Monitor Anesthesia Care | Site: Knee | Laterality: Left

## 2023-11-18 MED ORDER — DEXMEDETOMIDINE HCL IN NACL 80 MCG/20ML IV SOLN
INTRAVENOUS | Status: DC | PRN
  Administered 2023-11-18: 8 ug via INTRAVENOUS

## 2023-11-18 MED ORDER — PHENYLEPHRINE 80 MCG/ML (10ML) SYRINGE FOR IV PUSH (FOR BLOOD PRESSURE SUPPORT)
PREFILLED_SYRINGE | INTRAVENOUS | Status: DC | PRN
  Administered 2023-11-18 (×2): 160 ug via INTRAVENOUS

## 2023-11-18 MED ORDER — FENTANYL CITRATE (PF) 100 MCG/2ML IJ SOLN
INTRAMUSCULAR | Status: AC
  Filled 2023-11-18: qty 2

## 2023-11-18 MED ORDER — TAMSULOSIN HCL 0.4 MG PO CAPS
0.4000 mg | ORAL_CAPSULE | Freq: Every day | ORAL | Status: DC
  Administered 2023-11-18 – 2023-11-21 (×4): 0.4 mg via ORAL
  Filled 2023-11-18 (×4): qty 1

## 2023-11-18 MED ORDER — FENTANYL CITRATE (PF) 100 MCG/2ML IJ SOLN: 50.0000 ug | Freq: Once | INTRAMUSCULAR | Status: AC

## 2023-11-18 MED ORDER — HYDROMORPHONE HCL 1 MG/ML IJ SOLN
0.5000 mg | INTRAMUSCULAR | Status: DC | PRN
  Filled 2023-11-18: qty 1

## 2023-11-18 MED ORDER — OXYCODONE HCL 5 MG PO TABS
5.0000 mg | ORAL_TABLET | ORAL | Status: DC | PRN
  Administered 2023-11-19: 10 mg via ORAL
  Administered 2023-11-19: 5 mg via ORAL
  Administered 2023-11-19 (×2): 10 mg via ORAL
  Administered 2023-11-20: 5 mg via ORAL
  Administered 2023-11-20 – 2023-11-21 (×5): 10 mg via ORAL
  Administered 2023-11-21: 5 mg via ORAL
  Administered 2023-11-21 – 2023-11-22 (×2): 10 mg via ORAL
  Filled 2023-11-18 (×9): qty 2

## 2023-11-18 MED ORDER — EPHEDRINE SULFATE-NACL 50-0.9 MG/10ML-% IV SOSY
PREFILLED_SYRINGE | INTRAVENOUS | Status: DC | PRN
  Administered 2023-11-18: 10 mg via INTRAVENOUS
  Administered 2023-11-18: 5 mg via INTRAVENOUS
  Administered 2023-11-18: 10 mg via INTRAVENOUS

## 2023-11-18 MED ORDER — OXYCODONE HCL 5 MG PO TABS: 5.0000 mg | ORAL_TABLET | Freq: Once | ORAL | Status: DC | PRN

## 2023-11-18 MED ORDER — HYDRALAZINE HCL 20 MG/ML IJ SOLN: 5.0000 mg | INTRAMUSCULAR | Status: DC | PRN

## 2023-11-18 MED ORDER — ACETAMINOPHEN 500 MG PO TABS
1000.0000 mg | ORAL_TABLET | Freq: Once | ORAL | Status: AC
  Administered 2023-11-18: 1000 mg via ORAL
  Filled 2023-11-18: qty 2

## 2023-11-18 MED ORDER — LABETALOL HCL 5 MG/ML IV SOLN: 10.0000 mg | INTRAVENOUS | Status: DC | PRN

## 2023-11-18 MED ORDER — PHENYLEPHRINE 80 MCG/ML (10ML) SYRINGE FOR IV PUSH (FOR BLOOD PRESSURE SUPPORT)
PREFILLED_SYRINGE | INTRAVENOUS | Status: AC
  Filled 2023-11-18: qty 10

## 2023-11-18 MED ORDER — ROPIVACAINE HCL 5 MG/ML IJ SOLN
INTRAMUSCULAR | Status: DC | PRN
  Administered 2023-11-18: 25 mL via PERINEURAL
  Administered 2023-11-18: 15 mL via PERINEURAL

## 2023-11-18 MED ORDER — ALBUMIN HUMAN 5 % IV SOLN
INTRAVENOUS | Status: AC
  Filled 2023-11-18: qty 250

## 2023-11-18 MED ORDER — ORAL CARE MOUTH RINSE: 15.0000 mL | Freq: Once | OROMUCOSAL | Status: AC

## 2023-11-18 MED ORDER — VASHE WOUND IRRIGATION OPTIME
TOPICAL | Status: DC | PRN
  Administered 2023-11-18: 34 [oz_av]

## 2023-11-18 MED ORDER — COLCHICINE 0.6 MG PO TABS
0.6000 mg | ORAL_TABLET | Freq: Every day | ORAL | Status: DC
  Administered 2023-11-18 – 2023-11-22 (×5): 0.6 mg via ORAL
  Filled 2023-11-18 (×5): qty 1

## 2023-11-18 MED ORDER — PROPOFOL 10 MG/ML IV BOLUS
INTRAVENOUS | Status: AC
  Filled 2023-11-18: qty 20

## 2023-11-18 MED ORDER — PROPOFOL 500 MG/50ML IV EMUL
INTRAVENOUS | Status: DC | PRN
  Administered 2023-11-18: 85 ug/kg/min via INTRAVENOUS

## 2023-11-18 MED ORDER — TRAZODONE HCL 100 MG PO TABS
100.0000 mg | ORAL_TABLET | Freq: Two times a day (BID) | ORAL | Status: DC
  Administered 2023-11-18 – 2023-11-22 (×8): 100 mg via ORAL
  Filled 2023-11-18 (×10): qty 1

## 2023-11-18 MED ORDER — ACETAMINOPHEN 325 MG PO TABS: 325.0000 mg | ORAL_TABLET | Freq: Four times a day (QID) | ORAL | Status: DC | PRN

## 2023-11-18 MED ORDER — CEFAZOLIN SODIUM-DEXTROSE 3-4 GM/150ML-% IV SOLN
3.0000 g | INTRAVENOUS | Status: AC
  Administered 2023-11-18: 3 g via INTRAVENOUS
  Filled 2023-11-18: qty 150

## 2023-11-18 MED ORDER — PHENOL 1.4 % MT LIQD: 1.0000 | OROMUCOSAL | Status: DC | PRN

## 2023-11-18 MED ORDER — LACTATED RINGERS IV SOLN: INTRAVENOUS | Status: DC

## 2023-11-18 MED ORDER — ALBUMIN HUMAN 5 % IV SOLN
12.5000 g | Freq: Once | INTRAVENOUS | Status: AC
  Administered 2023-11-18: 12.5 g via INTRAVENOUS

## 2023-11-18 MED ORDER — ACETAMINOPHEN 500 MG PO TABS
1000.0000 mg | ORAL_TABLET | Freq: Four times a day (QID) | ORAL | Status: AC
  Administered 2023-11-18 – 2023-11-19 (×4): 1000 mg via ORAL
  Filled 2023-11-18 (×4): qty 2

## 2023-11-18 MED ORDER — CHLORHEXIDINE GLUCONATE 0.12 % MT SOLN
15.0000 mL | Freq: Once | OROMUCOSAL | Status: AC
Start: 2023-11-18 — End: 2023-11-18
  Administered 2023-11-18: 15 mL via OROMUCOSAL
  Filled 2023-11-18: qty 15

## 2023-11-18 MED ORDER — DOCUSATE SODIUM 100 MG PO CAPS
100.0000 mg | ORAL_CAPSULE | Freq: Every day | ORAL | Status: DC
Start: 2023-11-19 — End: 2023-11-22
  Administered 2023-11-19 – 2023-11-22 (×4): 100 mg via ORAL
  Filled 2023-11-18 (×4): qty 1

## 2023-11-18 MED ORDER — MIDAZOLAM HCL 2 MG/2ML IJ SOLN
INTRAMUSCULAR | Status: AC
Start: 2023-11-18 — End: 2023-11-18
  Administered 2023-11-18: 2 mg via INTRAVENOUS
  Filled 2023-11-18: qty 2

## 2023-11-18 MED ORDER — LIDOCAINE 2% (20 MG/ML) 5 ML SYRINGE
INTRAMUSCULAR | Status: AC
  Filled 2023-11-18: qty 5

## 2023-11-18 MED ORDER — ONDANSETRON HCL 4 MG/2ML IJ SOLN
INTRAMUSCULAR | Status: DC | PRN
  Administered 2023-11-18: 4 mg via INTRAVENOUS

## 2023-11-18 MED ORDER — AMISULPRIDE (ANTIEMETIC) 5 MG/2ML IV SOLN: 10.0000 mg | Freq: Once | INTRAVENOUS | Status: DC | PRN

## 2023-11-18 MED ORDER — SODIUM CHLORIDE 0.9 % IV SOLN: INTRAVENOUS | Status: AC

## 2023-11-18 MED ORDER — MIDAZOLAM HCL (PF) 2 MG/2ML IJ SOLN: 2.0000 mg | Freq: Once | INTRAMUSCULAR | Status: AC

## 2023-11-18 MED ORDER — CEFAZOLIN SODIUM-DEXTROSE 2-4 GM/100ML-% IV SOLN
2.0000 g | Freq: Three times a day (TID) | INTRAVENOUS | Status: AC
  Administered 2023-11-18 – 2023-11-19 (×2): 2 g via INTRAVENOUS
  Filled 2023-11-18 (×2): qty 100

## 2023-11-18 MED ORDER — WARFARIN SODIUM 5 MG PO TABS: 5.0000 mg | ORAL_TABLET | Freq: Every evening | ORAL | Status: DC

## 2023-11-18 MED ORDER — ONDANSETRON HCL 4 MG/2ML IJ SOLN: 4.0000 mg | Freq: Four times a day (QID) | INTRAMUSCULAR | Status: DC | PRN

## 2023-11-18 MED ORDER — OXYCODONE HCL 5 MG PO TABS
10.0000 mg | ORAL_TABLET | ORAL | Status: DC | PRN
  Administered 2023-11-20 (×2): 10 mg via ORAL
  Administered 2023-11-22: 15 mg via ORAL
  Administered 2023-11-22: 10 mg via ORAL
  Filled 2023-11-18 (×5): qty 2
  Filled 2023-11-18: qty 3
  Filled 2023-11-18 (×2): qty 2

## 2023-11-18 MED ORDER — METOPROLOL TARTRATE 5 MG/5ML IV SOLN: 2.0000 mg | INTRAVENOUS | Status: DC | PRN

## 2023-11-18 MED ORDER — ROPINIROLE HCL 0.5 MG PO TABS
0.5000 mg | ORAL_TABLET | Freq: Every day | ORAL | Status: DC
  Administered 2023-11-19 – 2023-11-22 (×4): 0.5 mg via ORAL
  Filled 2023-11-18 (×4): qty 1

## 2023-11-18 MED ORDER — OXYCODONE HCL 5 MG/5ML PO SOLN: 5.0000 mg | Freq: Once | ORAL | Status: DC | PRN

## 2023-11-18 MED ORDER — ATORVASTATIN CALCIUM 10 MG PO TABS
20.0000 mg | ORAL_TABLET | Freq: Every day | ORAL | Status: DC
  Administered 2023-11-19 – 2023-11-22 (×4): 20 mg via ORAL
  Filled 2023-11-18 (×4): qty 2

## 2023-11-18 MED ORDER — BUSPIRONE HCL 15 MG PO TABS
15.0000 mg | ORAL_TABLET | Freq: Three times a day (TID) | ORAL | Status: DC
  Administered 2023-11-18 – 2023-11-22 (×12): 15 mg via ORAL
  Filled 2023-11-18 (×2): qty 3
  Filled 2023-11-18 (×2): qty 1
  Filled 2023-11-18: qty 3
  Filled 2023-11-18 (×8): qty 1
  Filled 2023-11-18 (×2): qty 3
  Filled 2023-11-18: qty 1
  Filled 2023-11-18: qty 3
  Filled 2023-11-18 (×2): qty 1
  Filled 2023-11-18: qty 3
  Filled 2023-11-18: qty 1
  Filled 2023-11-18 (×2): qty 3

## 2023-11-18 MED ORDER — GABAPENTIN 100 MG PO CAPS
100.0000 mg | ORAL_CAPSULE | Freq: Every day | ORAL | Status: DC
  Administered 2023-11-18 – 2023-11-22 (×23): 100 mg via ORAL
  Filled 2023-11-18 (×22): qty 1

## 2023-11-18 MED ORDER — FLUOXETINE HCL 20 MG PO CAPS
40.0000 mg | ORAL_CAPSULE | Freq: Every day | ORAL | Status: DC
  Administered 2023-11-19 – 2023-11-22 (×4): 40 mg via ORAL
  Filled 2023-11-18 (×4): qty 2

## 2023-11-18 MED ORDER — 0.9 % SODIUM CHLORIDE (POUR BTL) OPTIME
TOPICAL | Status: DC | PRN
  Administered 2023-11-18: 1000 mL

## 2023-11-18 MED ORDER — CARIPRAZINE HCL 1.5 MG PO CAPS
3.0000 mg | ORAL_CAPSULE | Freq: Two times a day (BID) | ORAL | Status: DC
Start: 2023-11-18 — End: 2023-11-22
  Administered 2023-11-18 – 2023-11-22 (×8): 3 mg via ORAL
  Filled 2023-11-18 (×9): qty 2

## 2023-11-18 MED ORDER — PROPOFOL 10 MG/ML IV BOLUS
INTRAVENOUS | Status: DC | PRN
  Administered 2023-11-18: 50 mg via INTRAVENOUS

## 2023-11-18 MED ORDER — FENTANYL CITRATE (PF) 100 MCG/2ML IJ SOLN: 25.0000 ug | INTRAMUSCULAR | Status: DC | PRN

## 2023-11-18 MED ORDER — EPHEDRINE 5 MG/ML INJ
INTRAVENOUS | Status: AC
  Filled 2023-11-18: qty 5

## 2023-11-18 MED ORDER — FENTANYL CITRATE (PF) 100 MCG/2ML IJ SOLN
INTRAMUSCULAR | Status: AC
  Administered 2023-11-18: 50 ug via INTRAVENOUS
  Filled 2023-11-18: qty 2

## 2023-11-18 MED ORDER — POTASSIUM CHLORIDE CRYS ER 20 MEQ PO TBCR: 40.0000 meq | EXTENDED_RELEASE_TABLET | Freq: Every day | ORAL | Status: DC | PRN

## 2023-11-18 SURGICAL SUPPLY — 30 items
BAG COUNTER SPONGE SURGICOUNT (BAG) ×1 IMPLANT
BLADE SAW RECIP 87.9 MT (BLADE) IMPLANT
BLADE SURG 21 STRL SS (BLADE) ×1 IMPLANT
CANISTER WOUND CARE 500ML ATS (WOUND CARE) ×1 IMPLANT
CLEANSER WND VASHE INSTL 34OZ (WOUND CARE) IMPLANT
COVER SURGICAL LIGHT HANDLE (MISCELLANEOUS) ×1 IMPLANT
DRAPE EXTREMITY T 121X128X90 (DISPOSABLE) ×1 IMPLANT
DRAPE HALF SHEET 40X57 (DRAPES) ×1 IMPLANT
DRAPE INCISE IOBAN 66X45 STRL (DRAPES) ×1 IMPLANT
DRAPE U-SHAPE 47X51 STRL (DRAPES) ×2 IMPLANT
DRESSING PREVENA PLUS CUSTOM (GAUZE/BANDAGES/DRESSINGS) ×1 IMPLANT
DRSG VAC PEEL AND PLACE LRG (GAUZE/BANDAGES/DRESSINGS) IMPLANT
DURAPREP 26ML APPLICATOR (WOUND CARE) ×1 IMPLANT
ELECTRODE REM PT RTRN 9FT ADLT (ELECTROSURGICAL) ×1 IMPLANT
GLOVE BIOGEL PI IND STRL 9 (GLOVE) ×1 IMPLANT
GLOVE SURG ORTHO 9.0 STRL STRW (GLOVE) ×1 IMPLANT
GOWN STRL REUS W/ TWL XL LVL3 (GOWN DISPOSABLE) ×2 IMPLANT
GRAFT SKIN WND MICRO 38 (Tissue) IMPLANT
KIT BASIN OR (CUSTOM PROCEDURE TRAY) ×1 IMPLANT
KIT TURNOVER KIT B (KITS) ×1 IMPLANT
MANIFOLD NEPTUNE II (INSTRUMENTS) ×1 IMPLANT
PACK GENERAL/GYN (CUSTOM PROCEDURE TRAY) ×1 IMPLANT
PAD ARMBOARD POSITIONER FOAM (MISCELLANEOUS) ×1 IMPLANT
PREVENA RESTOR ARTHOFORM 46X30 (CANNISTER) ×1 IMPLANT
SOLN 0.9% NACL POUR BTL 1000ML (IV SOLUTION) ×1 IMPLANT
STAPLER SKIN PROX 35W (STAPLE) IMPLANT
SUT ETHILON 2 0 PSLX (SUTURE) ×2 IMPLANT
SUT SILK 2-0 18XBRD TIE 12 (SUTURE) IMPLANT
SUT VIC AB 1 CTX36XBRD ANBCTR (SUTURE) IMPLANT
TOWEL GREEN STERILE (TOWEL DISPOSABLE) ×1 IMPLANT

## 2023-11-18 NOTE — Plan of Care (Signed)
  Problem: Education: Goal: Knowledge of General Education information will improve Description: Including pain rating scale, medication(s)/side effects and non-pharmacologic comfort measures Outcome: Progressing   Problem: Clinical Measurements: Goal: Ability to maintain clinical measurements within normal limits will improve Outcome: Progressing   Problem: Activity: Goal: Risk for activity intolerance will decrease Outcome: Progressing   Problem: Nutrition: Goal: Adequate nutrition will be maintained Outcome: Progressing   Problem: Coping: Goal: Level of anxiety will decrease Outcome: Progressing   Problem: Elimination: Goal: Will not experience complications related to bowel motility Outcome: Progressing   Problem: Pain Managment: Goal: General experience of comfort will improve and/or be controlled Outcome: Progressing   Problem: Safety: Goal: Ability to remain free from injury will improve Outcome: Progressing

## 2023-11-18 NOTE — Interval H&P Note (Signed)
 History and Physical Interval Note:  11/18/2023 8:26 AM  [name]  has presented today for surgery, with the diagnosis of Redundant Tissue Left Below Knee Amputation.  The various methods of treatment have been discussed with the patient and family. After consideration of risks, benefits and other options for treatment, the patient has consented to  Procedure(s): REVISION AMPUTATION, BELOW THE KNEE (Left) as a surgical intervention.  The patient's history has been reviewed, patient examined, no change in status, stable for surgery.  I have reviewed the patient's chart and labs.  Questions were answered to the patient's satisfaction.     Cloys Vera V Darcy Barbara

## 2023-11-18 NOTE — Op Note (Signed)
 11/18/2023  10:13 AM  PATIENT:  [name]    PRE-OPERATIVE DIAGNOSIS: Breakdown left below-knee amputation POST-OPERATIVE DIAGNOSIS:  Same  PROCEDURE:  REVISION BELOW THE KNEE AMPUTATION  SURGEON:  Jerona LULLA Sage, MD  PHYSICIAN ASSISTANT:None ANESTHESIA:   General  PREOPERATIVE INDICATIONS:  [name] is a  63 y.o. male with a diagnosis of Redundant Tissue Left Below Knee Amputation who failed conservative measures and elected for surgical management.    The risks benefits and alternatives were discussed with the patient preoperatively including but not limited to the risks of infection, bleeding, nerve injury, cardiopulmonary complications, the need for revision surgery, among others, and the patient was willing to proceed.  OPERATIVE IMPLANTS:   Implant Name Type Inv. Item Serial No. Manufacturer Lot No. LRB No. Used Action  GRAFT SKIN WND MICRO 38 - [id number] Tissue GRAFT SKIN WND MICRO 38  [employer] [id number] Left 1 Implanted    @ENCIMAGES @  OPERATIVE FINDINGS: Patient had tophaceous material within the residual limb.  This was sent for cultures.  Will start colchicine.  OPERATIVE PROCEDURE: Patient was brought the operating room after undergoing a regional block he then underwent MAC anesthetic.  After adequate levels anesthesia were obtained patient's left lower extremity was prepped using DuraPrep draped into a sterile field a timeout was called.  A fishmouth incision was made around the redundant wound breakdown.  There was large tophaceous material medially this was sent for cultures.  Electrocautery was used hemostasis vascular bundles were suture-ligated with 2-0 silk.  The wound was cleansed with Vashe.  Tissue margins were clear.  The deep fascial layer was closed using #1 Vicryl skin was closed using staples.  A peel in place wound VAC was applied this had a good suction fit patient was taken the PACU in stable condition.   DISCHARGE  PLANNING:  Antibiotic duration: 24 hours antibiotics  Weightbearing: Nonweightbearing on the left  Pain medication: Opioid pathway  Dressing care/ Wound VAC: Wound VAC for 1 week  Ambulatory devices: Walker  Discharge to: Discharge to skilled nursing  Follow-up: In the office 1 week post operative.

## 2023-11-18 NOTE — Progress Notes (Signed)
 PHARMACY - ANTICOAGULATION CONSULT NOTE  Pharmacy Consult for warfarin Indication: hx VTE  No Known Allergies  Patient Measurements: Height: 5' 11 (180.3 cm) Weight: (!) 142.9 kg (315 lb) IBW/kg (Calculated) : 75.3 HEPARIN  DW (KG): 108.8  Vital Signs: Temp: 98 F (36.7 C) (10/24 1439) Temp Source: Oral (10/24 1439) BP: 105/65 (10/24 1439) Pulse Rate: 81 (10/24 1439)  Labs: Recent Labs    11/18/23 0722  HGB 14.6  HCT 44.5  PLT 230  LABPROT 19.9*  INR 1.6*  CREATININE 0.69    Estimated Creatinine Clearance: 138.5 mL/min (by C-G formula based on SCr of 0.69 mg/dL).   Medical History: Past Medical History:  Diagnosis Date   Anxiety    Depression    DVT (deep venous thrombosis) (HCC)    right clavicle and LLE   Factor V Leiden    Fractures    Frontal lobe deficit    Hirschsprung's disease (HCC)    Hypertension    Impingement syndrome of left ankle    MRSA infection    left hip    OA (osteoarthritis)    left ankle   Sleep apnea    wears CPAP      Assessment: 42 yoM admitted with BKA revision. 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 INR 1.6 on admit. Pharmacy to start warfarin tomorrow on POD1.  Home warfarin dose 5mg  daily  Goal of Therapy:  INR 2-3 Monitor platelets by anticoagulation protocol: Yes   Plan:  Hold warfarin tonight Daily INR  Ozell Jamaica, PharmD, BCPS, [facility] Clinical Pharmacist [phone] Please check AMION for all [facility] Pharmacy numbers 11/18/2023

## 2023-11-18 NOTE — Anesthesia Procedure Notes (Signed)
 Anesthesia Regional Block: Popliteal block   Pre-Anesthetic Checklist: , timeout performed,  Correct Patient, Correct Site, Correct Laterality,  Correct Procedure, Correct Position, site marked,  Risks and benefits discussed,  Surgical consent,  Pre-op evaluation,  At surgeon's request and post-op pain management  Laterality: Left  Prep: chloraprep       Needles:  Injection technique: Single-shot  Needle Type: Echogenic Stimulator Needle     Needle Length: 9cm  Needle Gauge: 21     Additional Needles:   Procedures:,,,, ultrasound used (permanent image in chart),,    Narrative:  Start time: 11/18/2023 8:05 AM End time: 11/18/2023 8:10 AM Injection made incrementally with aspirations every 5 mL.  Performed by: Personally  Anesthesiologist: Peggye Delon Brunswick, MD  Additional Notes: Discussed risks and benefits of nerve block including, but not limited to, prolonged and/or permanent nerve injury involving sensory and/or motor function. Monitors were applied and a time-out was performed. The nerve and associated structures were visualized under ultrasound guidance. After negative aspiration, local anesthetic was slowly injected around the nerve. There was no evidence of high pressure during the procedure. There were no paresthesias. VSS remained stable and the patient tolerated the procedure well.

## 2023-11-18 NOTE — Transfer of Care (Signed)
 Immediate Anesthesia Transfer of Care Note  Patient: [name]  Procedure(s) Performed: REVISION BELOW THE KNEE AMPUTATION (Left: Knee)  Patient Location: PACU  Anesthesia Type:MAC combined with regional for post-op pain  Level of Consciousness: awake, alert , and oriented  Airway & Oxygen Therapy: Patient Spontanous Breathing and Patient connected to nasal cannula oxygen  Post-op Assessment: Report given to RN and Post -op Vital signs reviewed and stable  Post vital signs: Reviewed and stable  Last Vitals:  Vitals Value Taken Time  BP 90/44 11/18/23 10:24  Temp    Pulse 57 11/18/23 10:26  Resp 17 11/18/23 10:26  SpO2 96 % 11/18/23 10:26  Vitals shown include unfiled device data.  Last Pain:  Vitals:   11/18/23 0756  TempSrc:   PainSc: 2       Patients Stated Pain Goal: 3 (11/18/23 0756)  Complications: No notable events documented.

## 2023-11-18 NOTE — Progress Notes (Signed)
 Orthopedic Tech Progress Note Patient Details:  DAICHI MORIS [birth year] [id number]  Patient ID: Debby MALVA Salt, male   DOB: [birth year], 63 y.o.   MRN: [id number] Retention sock called in to [employer]. Adine MARLA Blush 11/18/2023, 1:08 PM

## 2023-11-18 NOTE — Anesthesia Procedure Notes (Signed)
 Anesthesia Regional Block: Adductor canal block   Pre-Anesthetic Checklist: , timeout performed,  Correct Patient, Correct Site, Correct Laterality,  Correct Procedure, Correct Position, site marked,  Risks and benefits discussed,  Surgical consent,  Pre-op evaluation,  At surgeon's request and post-op pain management  Laterality: Left  Prep: chloraprep       Needles:  Injection technique: Single-shot  Needle Type: Echogenic Stimulator Needle     Needle Length: 9cm  Needle Gauge: 21     Additional Needles:   Procedures:,,,, ultrasound used (permanent image in chart),,    Narrative:  Start time: 11/18/2023 8:11 AM End time: 11/18/2023 8:15 AM Injection made incrementally with aspirations every 5 mL.  Performed by: Personally  Anesthesiologist: Peggye Delon Brunswick, MD  Additional Notes: Discussed risks and benefits of nerve block including, but not limited to, prolonged and/or permanent nerve injury involving sensory and/or motor function. Monitors were applied and a time-out was performed. The nerve and associated structures were visualized under ultrasound guidance. After negative aspiration, local anesthetic was slowly injected around the nerve. There was no evidence of high pressure during the procedure. There were no paresthesias. VSS remained stable and the patient tolerated the procedure well.

## 2023-11-18 NOTE — Anesthesia Postprocedure Evaluation (Signed)
 Anesthesia Post Note  Patient: MILAM ALLBAUGH  Procedure(s) Performed: REVISION BELOW THE KNEE AMPUTATION (Left: Knee)     Patient location during evaluation: PACU Anesthesia Type: Regional and MAC Level of consciousness: awake Pain management: pain level controlled Vital Signs Assessment: post-procedure vital signs reviewed and stable Respiratory status: spontaneous breathing, nonlabored ventilation and respiratory function stable Cardiovascular status: stable and blood pressure returned to baseline Postop Assessment: no apparent nausea or vomiting Anesthetic complications: no   No notable events documented.  Last Vitals:  Vitals:   11/18/23 1145 11/18/23 1200  BP: 103/71 102/70  Pulse: (!) 56 (!) 59  Resp: 10 10  Temp:  36.6 C  SpO2: 99% 99%    Last Pain:  Vitals:   11/18/23 1200  TempSrc:   PainSc: 0-No pain                 Delon Aisha Arch

## 2023-11-19 LAB — PROTIME-INR
INR: 1.3 — ABNORMAL HIGH (ref 0.8–1.2)
Prothrombin Time: 17 s — ABNORMAL HIGH (ref 11.4–15.2)

## 2023-11-19 LAB — BASIC METABOLIC PANEL WITH GFR
Anion gap: 11 (ref 5–15)
BUN: 22 mg/dL (ref 8–23)
CO2: 25 mmol/L (ref 22–32)
Calcium: 8.8 mg/dL — ABNORMAL LOW (ref 8.9–10.3)
Chloride: 101 mmol/L (ref 98–111)
Creatinine, Ser: 0.89 mg/dL (ref 0.61–1.24)
GFR, Estimated: >60 mL/min (ref >60)
Glucose, Bld: 91 mg/dL (ref 70–99)
Potassium: 4.1 mmol/L (ref 3.5–5.1)
Sodium: 137 mmol/L (ref 135–145)

## 2023-11-19 LAB — CBC
HCT: 40.4 % (ref 39.0–52.0)
Hemoglobin: 13.3 g/dL (ref 13.0–17.0)
MCH: 31.4 pg (ref 26.0–34.0)
MCHC: 32.9 g/dL (ref 30.0–36.0)
MCV: 95.5 fL (ref 80.0–100.0)
Platelets: 221 K/uL (ref 150–400)
RBC: 4.23 MIL/uL (ref 4.22–5.81)
RDW: 13.7 % (ref 11.5–15.5)
WBC: 8.9 K/uL (ref 4.0–10.5)
nRBC: 0 % (ref 0.0–0.2)

## 2023-11-19 MED ORDER — WARFARIN SODIUM 7.5 MG PO TABS
7.5000 mg | ORAL_TABLET | Freq: Once | ORAL | Status: AC
  Administered 2023-11-19: 7.5 mg via ORAL
  Filled 2023-11-19: qty 1

## 2023-11-19 MED ORDER — WARFARIN - PHARMACIST DOSING INPATIENT: Freq: Every day | Status: DC

## 2023-11-19 MED ORDER — ENOXAPARIN SODIUM 150 MG/ML IJ SOSY
140.0000 mg | PREFILLED_SYRINGE | Freq: Two times a day (BID) | INTRAMUSCULAR | Status: DC
  Administered 2023-11-19 – 2023-11-22 (×7): 140 mg via SUBCUTANEOUS
  Filled 2023-11-19 (×9): qty 0.94

## 2023-11-19 NOTE — Progress Notes (Signed)
 Patient ID: [name], male   DOB: 12-25-1960, 63 y.o.   MRN: [id number] Patient is postoperative day 1 revision left transtibial amputation.  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  There is 350 cc in the wound VAC canister.  He is currently on colchicine twice a day.  Uric acid is currently 6.5.  Plan for discharge to skilled nursing.

## 2023-11-19 NOTE — Evaluation (Signed)
 Physical Therapy Evaluation Patient Details Name: [name] MRN: [id number] DOB: 03/28/1960 Today's Date: 11/19/2023  History of Present Illness  [name] is a  63 y.o. male admitted 11/18/23 with a diagnosis of L BKA redundant tissue/breakdown. He failed conservative measures and elected for surgical management. Pt s/p L BKA revision 10/24. PMHx: anxiety, depression, DVT, Factor V Leiden, Hirschsprung's disease, HTN, and OSA with CPAP.   Clinical Impression  Pt admitted with above diagnosis. PTA, pt was modI for functional mobility wearing L BKA prosthesis and intermittently using SPC. He lives alone in an apartment with 3 STE. Pt currently with functional limitations due to the deficits listed below (see PT Problem List). He performed bed mobility with modI and required CGA for sit<>stand and gait using RW. Pt is currently limited by LLE pain, impaired balance, and decreased activity tolerance. Educated pt on L BKA HEP and provided him with a handout (Medbridge Access Code: [email or web address]). Pt will benefit from acute skilled PT to increase his independence and safety with mobility to allow discharge. Recommend continued inpatient follow up therapy, <3 hours/day.    If plan is discharge home, recommend the following: A little help with walking and/or transfers;A little help with bathing/dressing/bathroom;Assistance with cooking/housework;Assist for transportation;Help with stairs or ramp for entrance   Can travel by private vehicle   Yes    Equipment Recommendations Other (comment) (Ramp)  Recommendations for Other Services       Functional Status Assessment Patient has had a recent decline in their functional status and demonstrates the ability to make significant improvements in function in a reasonable and predictable amount of time.     Precautions / Restrictions Precautions Precautions: Fall Recall of Precautions/Restrictions: Intact Precaution/Restrictions Comments: Wound Vac  LLE Restrictions Weight Bearing Restrictions Per Provider Order: Yes LLE Weight Bearing Per Provider Order: Non weight bearing      Mobility  Bed Mobility Overal bed mobility: Modified Independent Bed Mobility: Supine to Sit, Sit to Supine           General bed mobility comments: Pt performed supine<>sit with HOB elevated to 40-50deg and use of bed rail.    Transfers Overall transfer level: Needs assistance Equipment used: Rolling walker (2 wheels) Transfers: Sit to/from Stand Sit to Stand: Contact guard assist           General transfer comment: Pt stood from lowest bed height. He demonstrated proper hand placement using RW. Powered up with light assist. Good eccentric control.    Ambulation/Gait Ambulation/Gait assistance: Contact guard assist Gait Distance (Feet): 30 Feet Assistive device: Rolling walker (2 wheels) Gait Pattern/deviations: Step-to pattern Gait velocity: decreased     General Gait Details: Pt ambulated using a hopping technique. Heavy reliance on BUE support on RW. Pt navigated room/hallway well. He achieved good foot clearence and had soft landings. Distance limited d/t increasing LLE pain, BUE weakness, and fatigue.  Stairs            Wheelchair Mobility     Tilt Bed    Modified Rankin (Stroke Patients Only)       Balance Overall balance assessment: Needs assistance Sitting-balance support: Feet supported Sitting balance-Leahy Scale: Good     Standing balance support: Bilateral upper extremity supported, During functional activity Standing balance-Leahy Scale: Poor Standing balance comment: Pt dependent on RW                             Pertinent Vitals/Pain  Pain Assessment Pain Assessment: 0-10 Pain Score: 6  Pain Location: L residual limb Pain Descriptors / Indicators: Constant, Aching, Throbbing Pain Intervention(s): Monitored during session, Limited activity within patient's tolerance, Repositioned,  Patient requesting pain meds-RN notified    Home Living Family/patient expects to be discharged to:: Private residence Living Arrangements: Alone Available Help at Discharge: Neighbor;Available PRN/intermittently Type of Home: Apartment Home Access: Stairs to enter Entrance Stairs-Rails: Right Entrance Stairs-Number of Steps: 3   Home Layout: One level Home Equipment: Agricultural Consultant (2 wheels);Cane - single point;Wheelchair - Careers Adviser (comment) (knee scooter)      Prior Function Prior Level of Function : Independent/Modified Independent             Mobility Comments: mod I with prosthetic and intermittent use of SPC, 1 recent fall ADLs Comments: mod I, pt states he steps over tub with RLE and takes prosthetic off to bathe and then gets out of the shower on his knees. Pt hasn't owned a car in 5 years, relies on deliveries. He has a transportation service that can come pick him up.     Extremity/Trunk Assessment   Upper Extremity Assessment Upper Extremity Assessment: Defer to OT evaluation    Lower Extremity Assessment Lower Extremity Assessment: LLE deficits/detail LLE Deficits / Details: Pt is POD 1 s/p transtibial amputation revision. He is wearing a shrinker. Decreased hip and knee AROM. Grossly 3/5 strength.    Cervical / Trunk Assessment Cervical / Trunk Assessment: Normal;Other exceptions Cervical / Trunk Exceptions: Increased Body Habitus  Communication   Communication Communication: Impaired Factors Affecting Communication: Hearing impaired    Cognition Arousal: Alert Behavior During Therapy: WFL for tasks assessed/performed   PT - Cognitive impairments: No apparent impairments                       PT - Cognition Comments: Pt A,Ox4 Following commands: Intact       Cueing Cueing Techniques: Verbal cues     General Comments General comments (skin integrity, edema, etc.): VSS on RA.    Exercises Amputee Exercises Quad Sets: Left,  AROM, 5 reps Straight Leg Raises: Left, AROM, 5 reps Other Exercises Other Exercises: Educated pt on L BKA HEP. Provided hanout, Medbridge Access Code: [email or web address].   Assessment/Plan    PT Assessment Patient needs continued PT services  PT Problem List Decreased strength;Decreased activity tolerance;Decreased balance;Decreased mobility;Pain       PT Treatment Interventions DME instruction;Gait training;Stair training;Functional mobility training;Therapeutic activities;Therapeutic exercise;Balance training;Patient/family education    PT Goals (Current goals can be found in the Care Plan section)  Acute Rehab PT Goals Patient Stated Goal: Regain independence and return Home PT Goal Formulation: With patient Time For Goal Achievement: 12/03/23 Potential to Achieve Goals: Good    Frequency Min 2X/week     Co-evaluation               AM-PAC PT 6 Clicks Mobility  Outcome Measure Help needed turning from your back to your side while in a flat bed without using bedrails?: A Little Help needed moving from lying on your back to sitting on the side of a flat bed without using bedrails?: A Little Help needed moving to and from a bed to a chair (including a wheelchair)?: A Little Help needed standing up from a chair using your arms (e.g., wheelchair or bedside chair)?: A Little Help needed to walk in hospital room?: A Little Help needed climbing 3-5 steps with a railing? : A Lot  6 Click Score: 17    End of Session Equipment Utilized During Treatment: Gait belt Activity Tolerance: Patient tolerated treatment well;Patient limited by pain Patient left: in bed;with call bell/phone within reach Nurse Communication: Mobility status PT Visit Diagnosis: Difficulty in walking, not elsewhere classified (R26.2);Unsteadiness on feet (R26.81);Other abnormalities of gait and mobility (R26.89);Pain Pain - Right/Left: Left Pain - part of body: Knee;Leg    Time: 8397-8375 PT Time Calculation  (min) (ACUTE ONLY): 22 min   Charges:   PT Evaluation $PT Eval Moderate Complexity: 1 Mod   PT General Charges $$ ACUTE PT VISIT: 1 Visit         Randall SAUNDERS, PT, DPT Acute Rehabilitation Services Office: [phone] Secure Chat Preferred  Delon CHRISTELLA Callander 11/19/2023, 4:35 PM

## 2023-11-19 NOTE — Evaluation (Signed)
 Occupational Therapy Evaluation Patient Details Name: [name] MRN: [id number] DOB: 05/13/1960 Today's Date: 11/19/2023   History of Present Illness   [name] is a  63 y.o. male admitted 11/18/23 with a diagnosis of L BKA redundant tissue/breakdown. He failed conservative measures and elected for surgical management. Pt s/p L BKA revision 10/24. PMHx: anxiety, depression, DVT, Factor V Leiden, Hirschsprung's disease, HTN, and OSA with CPAP.     Clinical Impressions [name] was evaluated s/p the above admission list. He lives alone and is mod I with use of his prosthetic at baseline. Upon evaluation the pt was limited by impaired balance, LLE soreness and activity tolerance. Overall he was mod I for bed mobility and needed CGA for transfers and mobility with the RW. Due to the deficits listed below the pt also needs up to min A for LB ADLs and is set up A for UB ADLs. The main barrier to going home is the 2 STE to his apartment, he states he has contacted management and they will not put in a ramp. Pt will benefit from continued acute OT services and skilled inpatient follow up therapy, <3 hours/day.      If plan is discharge home, recommend the following:   A little help with walking and/or transfers;A lot of help with bathing/dressing/bathroom;Assistance with cooking/housework;Help with stairs or ramp for entrance;Assist for transportation     Functional Status Assessment   Patient has had a recent decline in their functional status and demonstrates the ability to make significant improvements in function in a reasonable and predictable amount of time.     Equipment Recommendations   Tub/shower seat      Precautions/Restrictions   Precautions Precautions: Fall Precaution/Restrictions Comments: wound vac Restrictions Weight Bearing Restrictions Per Provider Order: Yes LLE Weight Bearing Per Provider Order: Non weight bearing     Mobility Bed Mobility Overal  bed mobility: Needs Assistance Bed Mobility: Supine to Sit, Sit to Supine     Supine to sit: Modified independent (Device/Increase time) Sit to supine: Modified independent (Device/Increase time)        Transfers Overall transfer level: Needs assistance Equipment used: Rolling walker (2 wheels) Transfers: Sit to/from Stand Sit to Stand: Contact guard assist           General transfer comment: hopped ~52ft with RW      Balance Overall balance assessment: Needs assistance Sitting-balance support: Feet supported Sitting balance-Leahy Scale: Good     Standing balance support: Bilateral upper extremity supported, During functional activity Standing balance-Leahy Scale: Poor Standing balance comment: BUE support required in standing                           ADL either performed or assessed with clinical judgement   ADL Overall ADL's : Needs assistance/impaired Eating/Feeding: Independent   Grooming: Set up;Sitting   Upper Body Bathing: Set up;Sitting   Lower Body Bathing: Set up;Sitting/lateral leans   Upper Body Dressing : Set up;Sitting   Lower Body Dressing: Minimal assistance;Sit to/from stand   Toilet Transfer: Contact guard assist;Ambulation;Rolling walker (2 wheels)   Toileting- Clothing Manipulation and Hygiene: Modified independent;Sitting/lateral lean       Functional mobility during ADLs: Contact guard assist;Rolling walker (2 wheels) General ADL Comments: limited by activity tolerance and L BKA (pt typically wears his prosthetic all of the time)     Vision Baseline Vision/History: 0 No visual deficits Vision Assessment?: No apparent visual deficits  Perception Perception: Within Functional Limits       Praxis Praxis: WFL       Pertinent Vitals/Pain Pain Assessment Pain Assessment: Faces Faces Pain Scale: Hurts a little bit Pain Location: LLE Pain Descriptors / Indicators: Sore Pain Intervention(s): Limited activity  within patient's tolerance, Monitored during session     Extremity/Trunk Assessment Upper Extremity Assessment Upper Extremity Assessment: Overall WFL for tasks assessed   Lower Extremity Assessment Lower Extremity Assessment: Defer to PT evaluation   Cervical / Trunk Assessment Cervical / Trunk Assessment: Normal   Communication Communication Communication: Impaired Factors Affecting Communication: Hearing impaired   Cognition Arousal: Alert Behavior During Therapy: WFL for tasks assessed/performed Cognition: No apparent impairments           Following commands: Intact       Cueing  General Comments   Cueing Techniques: Verbal cues  VSS on RA    Home Living Family/patient expects to be discharged to:: Private residence Living Arrangements: Alone Available Help at Discharge: Neighbor;Available PRN/intermittently Type of Home: Apartment Home Access: Stairs to enter Entrance Stairs-Number of Steps: 2   Home Layout: One level     Bathroom Shower/Tub: Chief Strategy Officer: Standard Bathroom Accessibility: Yes   Home Equipment: Agricultural Consultant (2 wheels);Cane - single point;Wheelchair - manual (knee scooter)          Prior Functioning/Environment Prior Level of Function : Independent/Modified Independent             Mobility Comments: mod I with prosthetic and intermittent use of SPC, 1 recent fall ADLs Comments: mod I, pt states he steps over tub with RLE and takes prosthetic off to bathe and then gets out of the shower on his knees?    OT Problem List: Decreased strength;Decreased range of motion;Decreased activity tolerance;Impaired balance (sitting and/or standing);Decreased knowledge of use of DME or AE;Decreased knowledge of precautions   OT Treatment/Interventions: Self-care/ADL training;Therapeutic exercise;DME and/or AE instruction;Therapeutic activities;Patient/family education;Balance training      OT Goals(Current goals can  be found in the care plan section)   Acute Rehab OT Goals Patient Stated Goal: to get better OT Goal Formulation: With patient Time For Goal Achievement: 12/03/23 Potential to Achieve Goals: Good ADL Goals Pt Will Perform Grooming: with modified independence;standing Pt Will Perform Lower Body Bathing: with modified independence;sitting/lateral leans Pt Will Perform Lower Body Dressing: with modified independence Pt Will Transfer to Toilet: with modified independence;ambulating   OT Frequency:  Min 2X/week       AM-PAC OT 6 Clicks Daily Activity     Outcome Measure Help from another person eating meals?: None Help from another person taking care of personal grooming?: A Little Help from another person toileting, which includes using toliet, bedpan, or urinal?: A Little Help from another person bathing (including washing, rinsing, drying)?: A Little Help from another person to put on and taking off regular upper body clothing?: A Little Help from another person to put on and taking off regular lower body clothing?: A Little 6 Click Score: 19   End of Session Equipment Utilized During Treatment: Rolling walker (2 wheels) Nurse Communication: Mobility status  Activity Tolerance: Patient tolerated treatment well Patient left: in bed;with call bell/phone within reach  OT Visit Diagnosis: Unsteadiness on feet (R26.81);History of falling (Z91.81);Muscle weakness (generalized) (M62.81)                Time: 1240-1310 OT Time Calculation (min): 30 min Charges:  OT General Charges $OT Visit: 1 Visit  OT Evaluation $OT Eval Moderate Complexity: 1 Mod  Lucie Kendall, OTR/L Acute Rehabilitation Services Office [phone] Secure Chat Communication Preferred   Lucie JONETTA Kendall 11/19/2023, 1:18 PM

## 2023-11-19 NOTE — Progress Notes (Addendum)
 PHARMACY - ANTICOAGULATION CONSULT NOTE  Pharmacy Consult for warfarin Indication: hx VTE, factor V leiden  No Known Allergies  Patient Measurements: Height: 5' 11 (180.3 cm) Weight: (!) 142.9 kg (315 lb) IBW/kg (Calculated) : 75.3 HEPARIN  DW (KG): 108.8  Vital Signs: Temp: 98 F (36.7 C) (10/25 0802) Temp Source: Oral (10/25 0802) BP: 100/68 (10/25 0802) Pulse Rate: 67 (10/25 0802)  Labs: Recent Labs    11/18/23 0722 11/19/23 0343  HGB 14.6 13.3  HCT 44.5 40.4  PLT 230 221  LABPROT 19.9* 17.0*  INR 1.6* 1.3*  CREATININE 0.69 0.89    Estimated Creatinine Clearance: 124.5 mL/min (by C-G formula based on SCr of 0.89 mg/dL).   Medical History: Past Medical History:  Diagnosis Date   Anxiety    Depression    DVT (deep venous thrombosis) (HCC)    right clavicle and LLE   Factor V Leiden    Fractures    Frontal lobe deficit    Hirschsprung's disease (HCC)    Hypertension    Impingement syndrome of left ankle    MRSA infection    left hip    OA (osteoarthritis)    left ankle   Sleep apnea    wears CPAP      Assessment: 52 yoM admitted with BKA revision. 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 INR 1.6 on admit and 1.3 today. Pharmacy to restart warfarin today on POD1.  Home warfarin dose 5mg  daily  Goal of Therapy:  INR 2-3 Monitor platelets by anticoagulation protocol: Yes   Plan:  Warfarin 7.5mg  x 1, then consider restart home dose.  Will d/w Ortho as patient needs bridging with lovenox  until INR >2 due to Factor V leiden. Ok per Dr. Harden.  Lovenox  140mg  SQ q12h Daily INR  Jovanie Verge A. Lyle, PharmD, BCPS, FNKF Clinical Pharmacist [place] Please utilize Amion for appropriate phone number to reach the unit pharmacist [facility])  11/19/2023

## 2023-11-20 LAB — CBC
HCT: 38.1 % — ABNORMAL LOW (ref 39.0–52.0)
Hemoglobin: 12.8 g/dL — ABNORMAL LOW (ref 13.0–17.0)
MCH: 31.5 pg (ref 26.0–34.0)
MCHC: 33.6 g/dL (ref 30.0–36.0)
MCV: 93.8 fL (ref 80.0–100.0)
Platelets: 213 K/uL (ref 150–400)
RBC: 4.06 MIL/uL — ABNORMAL LOW (ref 4.22–5.81)
RDW: 13.5 % (ref 11.5–15.5)
WBC: 6.7 K/uL (ref 4.0–10.5)
nRBC: 0 % (ref 0.0–0.2)

## 2023-11-20 LAB — BASIC METABOLIC PANEL WITH GFR
Anion gap: 10 (ref 5–15)
BUN: 14 mg/dL (ref 8–23)
CO2: 25 mmol/L (ref 22–32)
Calcium: 8.9 mg/dL (ref 8.9–10.3)
Chloride: 102 mmol/L (ref 98–111)
Creatinine, Ser: 0.7 mg/dL (ref 0.61–1.24)
GFR, Estimated: >60 mL/min (ref >60)
Glucose, Bld: 97 mg/dL (ref 70–99)
Potassium: 3.7 mmol/L (ref 3.5–5.1)
Sodium: 137 mmol/L (ref 135–145)

## 2023-11-20 LAB — PROTIME-INR
INR: 1.2 (ref 0.8–1.2)
Prothrombin Time: 15.9 s — ABNORMAL HIGH (ref 11.4–15.2)

## 2023-11-20 MED ORDER — WARFARIN SODIUM 5 MG PO TABS
5.0000 mg | ORAL_TABLET | Freq: Once | ORAL | Status: AC
  Administered 2023-11-20: 5 mg via ORAL
  Filled 2023-11-20: qty 1

## 2023-11-20 NOTE — Plan of Care (Signed)

## 2023-11-20 NOTE — NC FL2 (Signed)
 [place]  MEDICAID [id number] LEVEL OF CARE FORM     IDENTIFICATION  Patient Name: [name] Birthdate: 1961/01/19 Sex: male Admission Date (Current Location): 11/18/2023  [facility] and [place] Number:  Producer, Television/film/video and Address:  [facility] [place], 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 (HCC) 11/18/2023   History of left below knee amputation (HCC) 05/31/2022   Abrasion, right knee, initial encounter    Wound infection 03/15/2018   Cellulitis of right anterior lower leg 03/15/2018   History of pulmonary embolus (PE) 03/15/2018   Cellulitis 03/14/2018   S/P ankle fusion 04/20/2017   Post-traumatic osteoarthritis, left ankle and foot    Arthritis of left subtalar joint 10/26/2016   Impingement of left ankle joint 07/14/2016   Ankle impingement syndrome, left    History of total right knee replacement 03/09/2016   Pain in left ankle and joints of left foot 03/09/2016    Orientation RESPIRATION BLADDER Height & Weight     Self, Time, Situation, Place  Normal Continent Weight: (!) 315 lb (142.9 kg) Height:  5' 11 (180.3 cm)  BEHAVIORAL SYMPTOMS/MOOD NEUROLOGICAL BOWEL NUTRITION STATUS      Continent Diet  AMBULATORY STATUS COMMUNICATION OF NEEDS Skin   Extensive Assist Verbally Normal, Surgical wounds                       Personal Care Assistance Level of Assistance  Bathing,  Feeding, Dressing Bathing Assistance: Limited assistance Feeding assistance: Independent Dressing Assistance: Limited assistance     Functional Limitations Info  Sight, Hearing, Speech Sight Info: Adequate Hearing Info: Adequate Speech Info: Adequate    SPECIAL CARE FACTORS FREQUENCY  PT (By licensed PT), OT (By licensed OT)     PT Frequency: 5x a week OT Frequency: 5x a week            Contractures Contractures Info: Not present    Additional Factors Info  Code Status, Allergies Code Status Info: Full Allergies Info: NKA           Current Medications (11/20/2023):  This is the current hospital active medication list Current Facility-Administered Medications  Medication Dose Route Frequency Provider Last Rate Last Admin   acetaminophen  (TYLENOL ) tablet 325-650 mg  325-650 mg Oral Q6H PRN Gerome Maurilio HERO, PA-C       atorvastatin  (LIPITOR) tablet 20 mg  20 mg Oral Daily Gerome Maurilio M, PA-C   20 mg at 11/20/23 0913   busPIRone (BUSPAR) tablet 15 mg  15 mg Oral TID Gerome Maurilio HERO, PA-C   15 mg at 11/20/23 0913   cariprazine (VRAYLAR) capsule 3 mg  3 mg Oral BID AC & HS Gerome Maurilio M, PA-C   3 mg at 11/20/23 0920   colchicine tablet 0.6 mg  0.6 mg Oral Daily Gerome Maurilio M, PA-C   0.6 mg at 11/20/23 9079  docusate sodium  (COLACE) capsule 100 mg  100 mg Oral Daily Gerome Herring M, PA-C   100 mg at 11/20/23 0913   enoxaparin  (LOVENOX ) injection 140 mg  140 mg Subcutaneous Q12H Lyle Kava A, RPH   140 mg at 11/20/23 9078   FLUoxetine  (PROZAC ) capsule 40 mg  40 mg Oral Daily Gerome Herring M, PA-C   40 mg at 11/20/23 9087   gabapentin  (NEURONTIN ) capsule 100 mg  100 mg Oral 6 X Daily Gerome Herring M, PA-C   100 mg at 11/20/23 1214   hydrALAZINE (APRESOLINE) injection 5 mg  5 mg Intravenous Q20 Min PRN Gerome Herring M, PA-C       HYDROmorphone  (DILAUDID ) injection 0.5-1 mg  0.5-1 mg Intravenous Q4H PRN Gerome Herring M, PA-C       labetalol (NORMODYNE) injection 10 mg   10 mg Intravenous Q10 min PRN Gerome Herring M, PA-C       metoprolol tartrate (LOPRESSOR) injection 2-5 mg  2-5 mg Intravenous Q2H PRN Gerome Herring M, PA-C       ondansetron  (ZOFRAN ) injection 4 mg  4 mg Intravenous Q6H PRN Gerome Herring M, PA-C       oxyCODONE  (Oxy IR/ROXICODONE ) immediate release tablet 10-15 mg  10-15 mg Oral Q4H PRN Gerome Herring M, PA-C   10 mg at 11/20/23 1322   oxyCODONE  (Oxy IR/ROXICODONE ) immediate release tablet 5-10 mg  5-10 mg Oral Q4H PRN Gerome Herring M, PA-C   5 mg at 11/20/23 0913   phenol (CHLORASEPTIC) mouth spray 1 spray  1 spray Mouth/Throat PRN Gerome Herring M, PA-C       potassium chloride SA (KLOR-CON M) CR tablet 40-60 mEq  40-60 mEq Oral Daily PRN Gerome Herring M, PA-C       rOPINIRole (REQUIP) tablet 0.5 mg  0.5 mg Oral Daily Gerome Herring M, PA-C   0.5 mg at 11/20/23 0913   tamsulosin (FLOMAX) capsule 0.4 mg  0.4 mg Oral QHS Gerome Herring M, PA-C   0.4 mg at 11/19/23 2049   traZODone  (DESYREL ) tablet 100 mg  100 mg Oral BID Gerome Herring M, PA-C   100 mg at 11/20/23 0920   warfarin (COUMADIN ) tablet 5 mg  5 mg Oral ONCE-1600 [place], Blake S, [facility]       Warfarin - Pharmacist Dosing Inpatient   Does not apply q1600 Lyle Kava LABOR, [facility]   Given at 11/19/23 1556     Discharge Medications: Please see discharge summary for a list of discharge medications.  Relevant Imaging Results:  Relevant Lab Results:   Additional Information SS#: [id number], [place]

## 2023-11-20 NOTE — Progress Notes (Signed)
 PHARMACY - ANTICOAGULATION CONSULT NOTE  Pharmacy Consult for warfarin Indication: hx VTE, factor V leiden  No Known Allergies  Patient Measurements: Height: 5' 11 (180.3 cm) Weight: (!) 142.9 kg (315 lb) IBW/kg (Calculated) : 75.3 HEPARIN  DW (KG): 108.8  Vital Signs:    Labs: Recent Labs    11/18/23 0722 11/19/23 0343 11/20/23 0406  HGB 14.6 13.3 12.8*  HCT 44.5 40.4 38.1*  PLT 230 221 213  LABPROT 19.9* 17.0* 15.9*  INR 1.6* 1.3* 1.2  CREATININE 0.69 0.89 0.70    Estimated Creatinine Clearance: 138.5 mL/min (by C-G formula based on SCr of 0.7 mg/dL).   Medical History: Past Medical History:  Diagnosis Date   Anxiety    Depression    DVT (deep venous thrombosis) (HCC)    right clavicle and LLE   Factor V Leiden    Fractures    Frontal lobe deficit    Hirschsprung's disease (HCC)    Hypertension    Impingement syndrome of left ankle    MRSA infection    left hip    OA (osteoarthritis)    left ankle   Sleep apnea    wears CPAP   INR during inpatient: 10/24 = 1.6 10/25 = 1.3 10/26 = 1.2   Assessment: 63 yoM admitted with BKA revision (10/24). 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 INR 1.6 on admit and pharmacy consulted to restart warfarin on POD1.  Home warfarin dose 5mg  daily.  Today's INR is subtherapeutic at 1.2. Patient restarted warfarin during inpatient yesterday and received a boosted dose of warfarin 7.5 mg x1. Will plan to give 5 mg x1 dose tonight and monitor INR trends during inpatient.   Goal of Therapy:  INR 2-3 Monitor platelets by anticoagulation protocol: Yes   Plan:  Warfarin 5 mg x1 tonight After discussion with Ortho, patient will bridge with lovenox  until INR >2 due to Factor V leiden.  Lovenox  140mg  SQ q12h Daily INR  Feliciano Close, PharmD PGY2 Infectious Diseases Pharmacy Resident  11/20/2023 7:54 AM

## 2023-11-20 NOTE — Progress Notes (Signed)
 Patient ID: [name], male   DOB: [birth year], 63 y.o.   MRN: [id number] Patient is status post transtibial amputation on the left.  There is no new drainage in the wound VAC canister total of 350 cc.  Patient will need discharge to skilled nursing.  Will discontinue his IV.

## 2023-11-20 NOTE — TOC Initial Note (Signed)
 Transition of Care [facility]) - Initial/Assessment Note    Patient Details  Name: [name] MRN: [id number] Date of Birth: [birth year]  Transition [facility] Care [facility]) CM/SW Contact:    Cena Ligas, LCSW Phone Number: 11/20/2023, 4:21 PM  Clinical Narrative:                  CSW received SNF consult. CSW met with pt at bedside. CSW introduced self and explained role at the hospital. Pt reports that PTA lived at home alone. Pt uses DME at home but was independent with ADL's.  CSW reviewed PT/OT recommendations for SNF. Pt reports he is OK with the SNF. Pt gave CSW permission to fax out to facilities in the area. Pt has no preference of facility at this time. CSW gave pt medicare.gov rating list to review. CSW explained insurance auth process.  CSW will continue to follow.   Expected Discharge Plan: Skilled Nursing Facility Barriers to Discharge: Continued Medical Work up   Patient Goals and CMS Choice Patient states their goals for this hospitalization and ongoing recovery are:: SNF CMS Medicare.gov Compare Post Acute Care list provided to:: Patient Choice offered to / list presented to : Patient      Expected Discharge Plan and Services       Living arrangements for the past 2 months: Single Family Home                                      Prior Living Arrangements/Services Living arrangements for the past 2 months: Single Family Home Lives with:: Self Patient language and need for interpreter reviewed:: Yes Do you feel safe going back to the place where you live?: Yes      Need for Family Participation in Patient Care: Yes (Comment) Care giver support system in place?: No (comment)   Criminal Activity/Legal Involvement Pertinent to Current Situation/Hospitalization: No - Comment as needed  Activities of Daily Living   ADL Screening (condition at time of admission) Independently performs ADLs?: Yes (appropriate for developmental age) Is the patient deaf or have  difficulty hearing?: No Does the patient have difficulty seeing, even when wearing glasses/contacts?: No Does the patient have difficulty concentrating, remembering, or making decisions?: No  Permission Sought/Granted Permission sought to share information with : Oceanographer granted to share information with : Yes, Verbal Permission Granted     Permission granted to share info w AGENCY: SNF        Emotional Assessment Appearance:: Appears stated age Attitude/Demeanor/Rapport: Engaged Affect (typically observed): Appropriate Orientation: : Oriented to Self, Oriented to Place, Oriented to  Time, Oriented to Situation Alcohol / Substance Use: Not Applicable Psych Involvement: No (comment)  Admission diagnosis:  Excessive and redundant skin and subcutaneous tissue [L98.7] 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 [facility]) [D11.887J] Patient Active Problem List   Diagnosis Date Noted   BKA stump complication (HCC) 11/18/2023   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 (HCC) 11/18/2023   History of left below knee amputation (HCC) 05/31/2022   Abrasion, right knee, initial encounter    Wound infection 03/15/2018   Cellulitis of right anterior lower leg 03/15/2018   History of pulmonary embolus (PE) 03/15/2018   Cellulitis 03/14/2018   S/P ankle fusion 04/20/2017   Post-traumatic osteoarthritis, left ankle and foot    Arthritis  of left subtalar joint 10/26/2016   Impingement of left ankle joint 07/14/2016   Ankle impingement syndrome, left    History of total right knee replacement 03/09/2016   Pain in left ankle and joints of left foot 03/09/2016   PCP:  [name], MD Pharmacy:   [facility] [phone] [place], [place] - [address] AT [facility] [address] [place] [address] Phone:  [phone] Fax: [phone]     Social Drivers of Health (SDOH) Social History: SDOH Screenings   Food Insecurity: Food Insecurity Present (11/18/2023)  Housing: Low Risk  (11/18/2023)  Transportation Needs: No Transportation Needs (11/18/2023)  Utilities: Not At Risk (11/18/2023)  Depression (PHQ2-9): Medium Risk (03/10/2018)  Social Connections: Socially Isolated (11/18/2023)  Tobacco Use: Medium Risk (11/18/2023)   SDOH Interventions:     Readmission Risk Interventions     No data to display

## 2023-11-20 NOTE — Progress Notes (Signed)
 Mobility Specialist Progress Note:    11/20/23 1000  Mobility  Activity Ambulated with assistance (In hallway)  Level of Assistance Contact guard assist, steadying assist  Assistive Device Front wheel walker  Distance Ambulated (ft) 60 ft  LLE Weight Bearing Per Provider Order NWB  Activity Response Tolerated well  Mobility Referral Yes  Mobility visit 1 Mobility  Mobility Specialist Start Time (ACUTE ONLY) [id number]  Mobility Specialist Stop Time (ACUTE ONLY) 0953  Mobility Specialist Time Calculation (min) (ACUTE ONLY) 10 min   Received pt in bed and eager for mobility. Pt required MinG for safety. Pt had x1 RLE buckling, but self corrected. Returned to room without fault. Left in bed. Personal belongings and call light within reach. All needs met.  Lavanda Pollack Mobility Specialist  Please contact via [employer] or  Rehab Office [phone]

## 2023-11-21 ENCOUNTER — Encounter (HOSPITAL_COMMUNITY): Payer: Self-pay | Admitting: Orthopedic Surgery

## 2023-11-21 LAB — BASIC METABOLIC PANEL WITH GFR
Anion gap: 8 (ref 5–15)
BUN: 11 mg/dL (ref 8–23)
CO2: 26 mmol/L (ref 22–32)
Calcium: 8.9 mg/dL (ref 8.9–10.3)
Chloride: 101 mmol/L (ref 98–111)
Creatinine, Ser: 0.59 mg/dL — ABNORMAL LOW (ref 0.61–1.24)
GFR, Estimated: >60 mL/min (ref >60)
Glucose, Bld: 111 mg/dL — ABNORMAL HIGH (ref 70–99)
Potassium: 3.8 mmol/L (ref 3.5–5.1)
Sodium: 135 mmol/L (ref 135–145)

## 2023-11-21 LAB — PROTIME-INR
INR: 1.4 — ABNORMAL HIGH (ref 0.8–1.2)
Prothrombin Time: 17.8 s — ABNORMAL HIGH (ref 11.4–15.2)

## 2023-11-21 MED ORDER — WARFARIN SODIUM 5 MG PO TABS
5.0000 mg | ORAL_TABLET | Freq: Every day | ORAL | Status: DC
  Administered 2023-11-21: 5 mg via ORAL
  Filled 2023-11-21: qty 1

## 2023-11-21 NOTE — Care Management Important Message (Signed)
 Important Message  Patient Details  Name: [name] MRN: [id number] Date of Birth: [birth year]   Important Message Given:  Yes - Medicare IM     Jon Cruel 11/21/2023, 1:32 PM

## 2023-11-21 NOTE — Progress Notes (Signed)
 Patient ID: [name], male   DOB: 1960/08/18, 63 y.o.   MRN: [id number] Patient is status post left transtibial amputation.  There is 350 cc in the wound VAC canister no new drainage.  Plan for discharge to skilled nursing.  Pharmacy to manage Coumadin  therapy.

## 2023-11-21 NOTE — TOC Progression Note (Signed)
 Transition of Care [facility]) - Progression Note    Patient Details  Name: LORENZA WINKLEMAN MRN: [id number] Date of Birth: 1960-09-24  [facility] CM/SW Contact  Bridget Cordella Simmonds, LCSW Phone Number: 11/21/2023, 2:30 PM  Clinical Narrative:   Bed offer at [facility] provided to pt, he is agreeable to accept this offer.  CSW confirmed with [facility] that they can receive pt with auth.  SNF auth request submitted in [place].    Expected Discharge Plan: Skilled Nursing Facility Barriers to Discharge: Continued Medical Work up               Expected Discharge Plan and Services       Living arrangements for the past 2 months: Single Family Home                                       Social Drivers of Health (SDOH) Interventions SDOH Screenings   Food Insecurity: Food Insecurity Present (11/18/2023)  Housing: Low Risk  (11/18/2023)  Transportation Needs: No Transportation Needs (11/18/2023)  Utilities: Not At Risk (11/18/2023)  Depression (PHQ2-9): Medium Risk (03/10/2018)  Social Connections: Socially Isolated (11/18/2023)  Tobacco Use: Medium Risk (11/18/2023)    Readmission Risk Interventions     No data to display

## 2023-11-21 NOTE — Progress Notes (Signed)
 PHARMACY - ANTICOAGULATION CONSULT NOTE  Pharmacy Consult for warfarin Indication: hx VTE, factor V leiden  No Known Allergies  Patient Measurements: Height: 5' 11 (180.3 cm) Weight: (!) 142.9 kg (315 lb) IBW/kg (Calculated) : 75.3 HEPARIN  DW (KG): 108.8  Vital Signs: Temp: 98.2 F (36.8 C) (10/27 0355) Temp Source: Oral (10/27 0355) BP: 119/68 (10/27 0355) Pulse Rate: 66 (10/27 0355)  Labs: Recent Labs    11/18/23 0722 11/19/23 0343 11/20/23 0406 11/21/23 0558  HGB 14.6 13.3 12.8*  --   HCT 44.5 40.4 38.1*  --   PLT 230 221 213  --   LABPROT 19.9* 17.0* 15.9* 17.8*  INR 1.6* 1.3* 1.2 1.4*  CREATININE 0.69 0.89 0.70 0.59*    Estimated Creatinine Clearance: 138.5 mL/min (A) (by C-G formula based on SCr of 0.59 mg/dL (L)).   Medical History: Past Medical History:  Diagnosis Date   Anxiety    Depression    DVT (deep venous thrombosis) (HCC)    right clavicle and LLE   Factor V Leiden    Fractures    Frontal lobe deficit    Hirschsprung's disease (HCC)    Hypertension    Impingement syndrome of left ankle    MRSA infection    left hip    OA (osteoarthritis)    left ankle   Sleep apnea    wears CPAP   INR during inpatient: 10/24 = 1.6 10/25 = 1.3 10/26 = 1.2  10/27 = 1.4  Assessment: 63 yoM admitted with BKA revision (10/24). 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 INR 1.6 on admit and pharmacy consulted to restart warfarin on POD1.  Home warfarin dose 5mg  daily.  10/27 AM update: INR 1.4  100% of meals eaten on 10/26   Goal of Therapy:  INR 2-3 Monitor platelets by anticoagulation protocol: Yes   Plan:  Warfarin 5 mg x1 tonight  Continue Lovenox  140mg  SQ q12h until INR >2 for at least 2 measurements in a row Daily INR, CBC qMWF Monitor for signs of bleeding   Benedetta Heath BS, PharmD, BCPS Clinical Pharmacist 11/21/2023 7:10 AM  Contact: [phone] after 3 PM

## 2023-11-22 ENCOUNTER — Other Ambulatory Visit (HOSPITAL_COMMUNITY): Payer: Self-pay

## 2023-11-22 ENCOUNTER — Telehealth (HOSPITAL_COMMUNITY): Payer: Self-pay | Admitting: Pharmacy Technician

## 2023-11-22 LAB — PROTIME-INR
INR: 1.5 — ABNORMAL HIGH (ref 0.8–1.2)
Prothrombin Time: 18.8 s — ABNORMAL HIGH (ref 11.4–15.2)

## 2023-11-22 MED ORDER — OXYCODONE-ACETAMINOPHEN 5-325 MG PO TABS: 1.0000 | ORAL_TABLET | ORAL | 0 refills | Status: DC | PRN

## 2023-11-22 MED ORDER — ENOXAPARIN SODIUM 150 MG/ML IJ SOSY: 140.0000 mg | PREFILLED_SYRINGE | Freq: Two times a day (BID) | INTRAMUSCULAR | 0 refills | Status: DC

## 2023-11-22 NOTE — Progress Notes (Signed)
 PHARMACY - ANTICOAGULATION CONSULT NOTE  Pharmacy Consult for warfarin Indication: hx VTE, factor V leiden  No Known Allergies  Patient Measurements: Height: 5' 11 (180.3 cm) Weight: (!) 142.9 kg (315 lb) IBW/kg (Calculated) : 75.3 HEPARIN  DW (KG): 108.8  Vital Signs: Temp: 98.7 F (37.1 C) (10/27 2010) Temp Source: Oral (10/27 2010) BP: 108/56 (10/27 2010) Pulse Rate: 69 (10/27 2010)  Labs: Recent Labs    11/20/23 0406 11/21/23 0558 11/22/23 0507  HGB 12.8*  --   --   HCT 38.1*  --   --   PLT 213  --   --   LABPROT 15.9* 17.8* 18.8*  INR 1.2 1.4* 1.5*  CREATININE 0.70 0.59*  --     Estimated Creatinine Clearance: 138.5 mL/min (A) (by C-G formula based on SCr of 0.59 mg/dL (L)).   Medical History: Past Medical History:  Diagnosis Date   Anxiety    Depression    DVT (deep venous thrombosis) (HCC)    right clavicle and LLE   Factor V Leiden    Fractures    Frontal lobe deficit    Hirschsprung's disease (HCC)    Hypertension    Impingement syndrome of left ankle    MRSA infection    left hip    OA (osteoarthritis)    left ankle   Sleep apnea    wears CPAP   INR during inpatient: 10/24 = 1.6 10/25 = 1.3 10/26 = 1.2  10/27 = 1.4 10/28 = 1.5  Assessment: 63 yoM admitted with BKA revision (10/24). 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 INR 1.6 on admit and pharmacy consulted to restart warfarin on POD1.  Home warfarin dose 5mg  daily.  10/28 AM update: INR 1.5  100% of meals eaten on 10/25-27   Goal of Therapy:  INR 2-3 Monitor platelets by anticoagulation protocol: Yes   Plan:  Warfarin 5 mg po daily (home dose) Continue Lovenox  140mg  SQ q12h until INR >2 for at least 2 measurements in a row Daily INR, CBC qMWF Monitor for signs of bleeding   Benedetta Heath BS, PharmD, BCPS Clinical Pharmacist 11/22/2023 7:15 AM  Contact: [phone] after 3 PM

## 2023-11-22 NOTE — TOC Progression Note (Addendum)
 Transition of Care [facility]) - Progression Note    Patient Details  Name: [name] MRN: [id number] Date of Birth: [birth year]  [facility] CM/SW Contact  Bridget Cordella Simmonds, LCSW Phone Number: 11/22/2023, 8:14 AM  Clinical Narrative:   SNF auth approved in [place]: [id number], 3 days: 10/28-10/30.  CSW spoke with pt regarding transportation.  Pt stating he does not have anyone who can bring him to [facility], does want to use ambulance transport, is aware he could be billed for this.  Per pt, no CPAP use for quite some time.  Per RN, no cpap use in the hospital. [facility] asked about this and was updated.   Expected Discharge Plan: Skilled Nursing Facility Barriers to Discharge: Continued Medical Work up               Expected Discharge Plan and Services       Living arrangements for the past 2 months: Single Family Home Expected Discharge Date: 11/22/23                                     Social Drivers of Health (SDOH) Interventions SDOH Screenings   Food Insecurity: Food Insecurity Present (11/18/2023)  Housing: Low Risk  (11/18/2023)  Transportation Needs: No Transportation Needs (11/18/2023)  Utilities: Not At Risk (11/18/2023)  Depression (PHQ2-9): Medium Risk (03/10/2018)  Social Connections: Socially Isolated (11/18/2023)  Tobacco Use: Medium Risk (11/18/2023)    Readmission Risk Interventions     No data to display

## 2023-11-22 NOTE — Discharge Summary (Addendum)
 Physician Discharge Summary  Patient ID: [name] MRN: [id number] DOB/AGE: 08/25/1960 63 y.o.  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) Active Problems:   BKA stump complication (HCC)   Idiopathic chronic gout of left knee without tophus   Discharge Diagnoses:  Same  Past Medical History:  Diagnosis Date   Anxiety    Depression    DVT (deep venous thrombosis) (HCC)    right clavicle and LLE   Factor V Leiden    Fractures    Frontal lobe deficit    Hirschsprung's disease (HCC)    Hypertension    Impingement syndrome of left ankle    MRSA infection    left hip    OA (osteoarthritis)    left ankle   Sleep apnea    wears CPAP    Surgeries: Procedure(s): REVISION BELOW THE KNEE AMPUTATION on 11/18/2023   Consultants:   Discharged Condition: Improved  Hospital Course: QUANAH MAJKA is an 63 y.o. male who was admitted 11/18/2023 with a chief complaint of No chief complaint on file. 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  They were brought to the operating room on 11/18/2023 and underwent the above named procedures.    They were given perioperative antibiotics:  Anti-infectives (From admission, onward)    Start     Dose/Rate Route Frequency Ordered Stop   11/18/23 1745  ceFAZolin  (ANCEF ) IVPB 2g/100 mL premix        2 g 200 mL/hr over 30 Minutes Intravenous Every 8 hours 11/18/23 1155 11/19/23 0221   11/18/23 0730  ceFAZolin  (ANCEF ) IVPB 3g/150 mL premix        3 g 300 mL/hr over 30 Minutes Intravenous On call to O.R. 11/18/23 9278 11/18/23 0945     .  They were given compression stockings, early ambulation, and chemoprophylaxis for DVT prophylaxis.  They benefited maximally from their hospital stay and there were no complications.    Recent vital signs:  Vitals:    11/21/23 1403 11/21/23 2010  BP: 124/62 (!) 108/56  Pulse: 72 69  Resp: 17 15  Temp: 98.5 F (36.9 C) 98.7 F (37.1 C)  SpO2: 94% 93%    Recent laboratory studies:  Results for orders placed or performed during the hospital encounter of 11/18/23  Basic metabolic panel   Collection Time: 11/18/23  7:22 AM  Result Value Ref Range   Sodium 136 135 - 145 mmol/L   Potassium 4.2 3.5 - 5.1 mmol/L   Chloride 105 98 - 111 mmol/L   CO2 20 (L) 22 - 32 mmol/L   Glucose, Bld 97 70 - 99 mg/dL   BUN 22 8 - 23 mg/dL   Creatinine, Ser 9.30 0.61 - 1.24 mg/dL   Calcium  9.0 8.9 - 10.3 mg/dL   GFR, Estimated >39 >39 mL/min   Anion gap 11 5 - 15  CBC WITH DIFFERENTIAL   Collection Time: 11/18/23  7:22 AM  Result Value Ref Range   WBC 7.7 4.0 - 10.5 K/uL   RBC 4.74 4.22 - 5.81 MIL/uL   Hemoglobin 14.6 13.0 - 17.0 g/dL   HCT 55.4 60.9 - 47.9 %   MCV 93.9 80.0 - 100.0 fL   MCH 30.8 26.0 - 34.0 pg   MCHC 32.8 30.0 - 36.0 g/dL   RDW 86.7 88.4 - 84.4 %   Platelets  230 150 - 400 K/uL   nRBC 0.0 0.0 - 0.2 %   Neutrophils Relative % 75 %   Neutro Abs 5.8 1.7 - 7.7 K/uL   Lymphocytes Relative 16 %   Lymphs Abs 1.2 0.7 - 4.0 K/uL   Monocytes Relative 8 %   Monocytes Absolute 0.6 0.1 - 1.0 K/uL   Eosinophils Relative 1 %   Eosinophils Absolute 0.1 0.0 - 0.5 K/uL   Basophils Relative 0 %   Basophils Absolute 0.0 0.0 - 0.1 K/uL   Immature Granulocytes 0 %   Abs Immature Granulocytes 0.02 0.00 - 0.07 K/uL  Protime-INR   Collection Time: 11/18/23  7:22 AM  Result Value Ref Range   Prothrombin Time 19.9 (H) 11.4 - 15.2 seconds   INR 1.6 (H) 0.8 - 1.2  Aerobic/Anaerobic Culture w Gram Stain (surgical/deep wound)   Collection Time: 11/18/23  9:49 AM   Specimen: Soft Tissue, Other  Result Value Ref Range   Specimen Description TISSUE    Special Requests LEFT BKA REVISION    Gram Stain NO WBC SEEN NO ORGANISMS SEEN     Culture      NO GROWTH 3 DAYS NO ANAEROBES ISOLATED; CULTURE IN PROGRESS  FOR 5 DAYS Performed at [facility], [address] [place], [place] [address]    Report Status PENDING   Uric acid   Collection Time: 11/18/23  2:55 PM  Result Value Ref Range   Uric Acid, Serum 6.5 3.7 - 8.6 mg/dL  CBC   Collection Time: 11/19/23  3:43 AM  Result Value Ref Range   WBC 8.9 4.0 - 10.5 K/uL   RBC 4.23 4.22 - 5.81 MIL/uL   Hemoglobin 13.3 13.0 - 17.0 g/dL   HCT 59.5 60.9 - 47.9 %   MCV 95.5 80.0 - 100.0 fL   MCH 31.4 26.0 - 34.0 pg   MCHC 32.9 30.0 - 36.0 g/dL   RDW 86.2 88.4 - 84.4 %   Platelets 221 150 - 400 K/uL   nRBC 0.0 0.0 - 0.2 %  Basic metabolic panel   Collection Time: 11/19/23  3:43 AM  Result Value Ref Range   Sodium 137 135 - 145 mmol/L   Potassium 4.1 3.5 - 5.1 mmol/L   Chloride 101 98 - 111 mmol/L   CO2 25 22 - 32 mmol/L   Glucose, Bld 91 70 - 99 mg/dL   BUN 22 8 - 23 mg/dL   Creatinine, Ser 9.10 0.61 - 1.24 mg/dL   Calcium  8.8 (L) 8.9 - 10.3 mg/dL   GFR, Estimated >39 >39 mL/min   Anion gap 11 5 - 15  Protime-INR   Collection Time: 11/19/23  3:43 AM  Result Value Ref Range   Prothrombin Time 17.0 (H) 11.4 - 15.2 seconds   INR 1.3 (H) 0.8 - 1.2  CBC   Collection Time: 11/20/23  4:06 AM  Result Value Ref Range   WBC 6.7 4.0 - 10.5 K/uL   RBC 4.06 (L) 4.22 - 5.81 MIL/uL   Hemoglobin 12.8 (L) 13.0 - 17.0 g/dL   HCT 61.8 (L) 60.9 - 47.9 %   MCV 93.8 80.0 - 100.0 fL   MCH 31.5 26.0 - 34.0 pg   MCHC 33.6 30.0 - 36.0 g/dL   RDW 86.4 88.4 - 84.4 %   Platelets 213 150 - 400 K/uL   nRBC 0.0 0.0 - 0.2 %  Basic metabolic panel   Collection Time: 11/20/23  4:06 AM  Result Value Ref  Range   Sodium 137 135 - 145 mmol/L   Potassium 3.7 3.5 - 5.1 mmol/L   Chloride 102 98 - 111 mmol/L   CO2 25 22 - 32 mmol/L   Glucose, Bld 97 70 - 99 mg/dL   BUN 14 8 - 23 mg/dL   Creatinine, Ser 9.29 0.61 - 1.24 mg/dL   Calcium  8.9 8.9 - 10.3 mg/dL   GFR, Estimated >39 >39 mL/min   Anion gap 10 5 - 15  Protime-INR   Collection Time: 11/20/23  4:06  AM  Result Value Ref Range   Prothrombin Time 15.9 (H) 11.4 - 15.2 seconds   INR 1.2 0.8 - 1.2  Basic metabolic panel   Collection Time: 11/21/23  5:58 AM  Result Value Ref Range   Sodium 135 135 - 145 mmol/L   Potassium 3.8 3.5 - 5.1 mmol/L   Chloride 101 98 - 111 mmol/L   CO2 26 22 - 32 mmol/L   Glucose, Bld 111 (H) 70 - 99 mg/dL   BUN 11 8 - 23 mg/dL   Creatinine, Ser 9.40 (L) 0.61 - 1.24 mg/dL   Calcium  8.9 8.9 - 10.3 mg/dL   GFR, Estimated >39 >39 mL/min   Anion gap 8 5 - 15  Protime-INR   Collection Time: 11/21/23  5:58 AM  Result Value Ref Range   Prothrombin Time 17.8 (H) 11.4 - 15.2 seconds   INR 1.4 (H) 0.8 - 1.2  Protime-INR   Collection Time: 11/22/23  5:07 AM  Result Value Ref Range   Prothrombin Time 18.8 (H) 11.4 - 15.2 seconds   INR 1.5 (H) 0.8 - 1.2    Discharge Medications:   Allergies as of 11/22/2023   No Known Allergies      Medication List     TAKE these medications    atorvastatin  20 MG tablet Commonly known as: LIPITOR Take 20 mg by mouth daily.   busPIRone 15 MG tablet Commonly known as: BUSPAR Take 15 mg by mouth 3 (three) times daily.   cariprazine 3 MG capsule Commonly known as: VRAYLAR Take 3 mg by mouth in the morning and at bedtime.   enoxaparin  150 MG/ML injection Commonly known as: LOVENOX  Inject 0.94 mLs (140 mg total) into the skin 2 (two) times daily for 5 days. Follow up INR 11/25/2023   FLUoxetine  40 MG capsule Commonly known as: PROZAC  Take 40 mg by mouth daily.   gabapentin  100 MG tablet Commonly known as: NEURONTIN  Take 100 mg by mouth 6 (six) times daily.   oxyCODONE -acetaminophen  5-325 MG tablet Commonly known as: PERCOCET/ROXICET Take 1 tablet by mouth every 4 (four) hours as needed.   rOPINIRole 0.5 MG tablet Commonly known as: REQUIP Take 0.5 mg by mouth daily.   tamsulosin 0.4 MG Caps capsule Commonly known as: FLOMAX Take 0.4 mg by mouth at bedtime.   traZODone  100 MG tablet Commonly known as:  DESYREL  Take 100 mg by mouth 2 (two) times daily.   warfarin 5 MG tablet Commonly known as: COUMADIN  Take 5 mg by mouth every evening.               Discharge Care Instructions  (From admission, onward)           Start     Ordered   11/22/23 0000  Change dressing       Comments: Dry dressing change BKA daily   11/22/23 9347            Diagnostic Studies: No results  found.  Disposition: Discharge disposition: 63-Rehab Facility       Discharge Instructions     Call MD / Call 911   Complete by: As directed    If you experience chest pain or shortness of breath, CALL 911 and be transported to the hospital emergency room.  If you develope a fever above 101 F, pus (white drainage) or increased drainage or redness at the wound, or calf pain, call your surgeon's office.   Change dressing   Complete by: As directed    Dry dressing change BKA daily   Constipation Prevention   Complete by: As directed    Drink plenty of fluids.  Prune juice may be helpful.  You may use a stool softener, such as Colace (over the counter) 100 mg twice a day.  Use MiraLax  (over the counter) for constipation as needed.   Diet - low sodium heart healthy   Complete by: As directed    Increase activity slowly as tolerated   Complete by: As directed    Post-operative opioid taper instructions:   Complete by: As directed    POST-OPERATIVE OPIOID TAPER INSTRUCTIONS: It is important to wean off of your opioid medication as soon as possible. If you do not need pain medication after your surgery it is ok to stop day one. Opioids include: Codeine, Hydrocodone (Norco, Vicodin), Oxycodone (Percocet, oxycontin ) and hydromorphone  amongst others.  Long term and even short term use of opiods can cause: Increased pain response Dependence Constipation Depression Respiratory depression And more.  Withdrawal symptoms can include Flu like symptoms Nausea, vomiting And more Techniques to manage  these symptoms Hydrate well Eat regular healthy meals Stay active Use relaxation techniques(deep breathing, meditating, yoga) Do Not substitute Alcohol to help with tapering If you have been on opioids for less than two weeks and do not have pain than it is ok to stop all together.  Plan to wean off of opioids This plan should start within one week post op of your joint replacement. Maintain the same interval or time between taking each dose and first decrease the dose.  Cut the total daily intake of opioids by one tablet each day Next start to increase the time between doses. The last dose that should be eliminated is the evening dose.           Follow-up Information     Harden Jerona GAILS, MD Follow up in 1 week(s).   Specialty: Orthopedic Surgery Contact information: [address] [place]  [place] [address] [phone]                  Signed: Jerona GAILS Harden 11/22/2023, 6:52 AM

## 2023-11-22 NOTE — TOC Transition Note (Addendum)
 Transition of Care [facility]) - Discharge Note   Patient Details  Name: [name] MRN: [id number] Date of Birth: Jul 29, 1960  [facility]) CM/SW Contact:  Bridget Cordella Simmonds, LCSW Phone Number: 11/22/2023, 10:38 AM   Clinical Narrative:   Pt will discharge to [facility].  RN report to [phone].  PTAR called 1035.  1100: Message from [facility]: room not ready yet.  Pt placed on will call with ptar.   1250: Message from [facility]: they are ready to receive pt.  PTAR called again to pick up pt.     Final next level of care: Skilled Nursing Facility Barriers to Discharge: Barriers Resolved   Patient Goals and CMS Choice Patient states their goals for this hospitalization and ongoing recovery are:: SNF CMS Medicare.gov Compare Post Acute Care list provided to:: Patient Choice offered to / list presented to : Patient      Discharge Placement              Patient chooses bed at: [facility] Patient to be transferred to facility by: ptar Name of family member notified: left message with sister Victoria Patient and family notified of of transfer: 11/22/23  Discharge Plan and Services Additional resources added to the After Visit Summary for                                       Social Drivers of Health (SDOH) Interventions SDOH Screenings   Food Insecurity: Food Insecurity Present (11/18/2023)  Housing: Low Risk  (11/18/2023)  Transportation Needs: No Transportation Needs (11/18/2023)  Utilities: Not At Risk (11/18/2023)  Depression (PHQ2-9): Medium Risk (03/10/2018)  Social Connections: Socially Isolated (11/18/2023)  Tobacco Use: Medium Risk (11/18/2023)     Readmission Risk Interventions     No data to display

## 2023-11-22 NOTE — Plan of Care (Signed)
  Problem: Education: Goal: Knowledge of General Education information will improve Description: Including pain rating scale, medication(s)/side effects and non-pharmacologic comfort measures Outcome: Adequate for Discharge   Problem: Health Behavior/Discharge Planning: Goal: Ability to manage health-related needs will improve Outcome: Adequate for Discharge   Problem: Clinical Measurements: Goal: Ability to maintain clinical measurements within normal limits will improve Outcome: Adequate for Discharge Goal: Will remain free from infection Outcome: Adequate for Discharge Goal: Diagnostic test results will improve Outcome: Adequate for Discharge Goal: Respiratory complications will improve Outcome: Adequate for Discharge Goal: Cardiovascular complication will be avoided Outcome: Adequate for Discharge   Problem: Activity: Goal: Risk for activity intolerance will decrease Outcome: Adequate for Discharge   Problem: Nutrition: Goal: Adequate nutrition will be maintained Outcome: Adequate for Discharge   Problem: Coping: Goal: Level of anxiety will decrease Outcome: Adequate for Discharge   Problem: Elimination: Goal: Will not experience complications related to bowel motility Outcome: Adequate for Discharge Goal: Will not experience complications related to urinary retention Outcome: Adequate for Discharge   Problem: Pain Managment: Goal: General experience of comfort will improve and/or be controlled Outcome: Adequate for Discharge   Problem: Safety: Goal: Ability to remain free from injury will improve Outcome: Adequate for Discharge   Problem: Skin Integrity: Goal: Risk for impaired skin integrity will decrease Outcome: Adequate for Discharge   Problem: Education: Goal: Knowledge of the prescribed therapeutic regimen will improve Outcome: Adequate for Discharge Goal: Ability to verbalize activity precautions or restrictions will improve Outcome: Adequate for  Discharge Goal: Understanding of discharge needs will improve Outcome: Adequate for Discharge   Problem: Activity: Goal: Ability to perform//tolerate increased activity and mobilize with assistive devices will improve Outcome: Adequate for Discharge   Problem: Clinical Measurements: Goal: Postoperative complications will be avoided or minimized Outcome: Adequate for Discharge   Problem: Self-Care: Goal: Ability to meet self-care needs will improve Outcome: Adequate for Discharge   Problem: Self-Concept: Goal: Ability to maintain and perform role responsibilities to the fullest extent possible will improve Outcome: Adequate for Discharge   Problem: Pain Management: Goal: Pain level will decrease with appropriate interventions Outcome: Adequate for Discharge   Problem: Skin Integrity: Goal: Demonstration of wound healing without infection will improve Outcome: Adequate for Discharge

## 2023-11-22 NOTE — Telephone Encounter (Signed)
 Patient Product/process Development Scientist completed.    The patient is insured through [facility]. Patient has Medicare and is not eligible for a copay card, but may be able to apply for patient assistance or Medicare RX Payment Plan (Patient Must reach out to their plan, if eligible for payment plan), if available.    Ran test claim for enoxaparin  120 mg/0.8 ml inj and the current 5 day co-pay is $4.90.   This test claim was processed through  [facility]- copay amounts may vary at other pharmacies due to pharmacy/plan contracts, or as the patient moves through the different stages of their insurance plan.     Reyes Sharps, CPHT Pharmacy Technician Patient Advocate Specialist Lead [facility] Patient Advocate Team Direct Number: [phone]  Fax: [phone]

## 2023-11-23 LAB — AEROBIC/ANAEROBIC CULTURE W GRAM STAIN (SURGICAL/DEEP WOUND)
Culture: NO GROWTH
Gram Stain: NONE SEEN

## 2023-11-28 ENCOUNTER — Encounter: Payer: Self-pay | Admitting: Radiology

## 2023-11-28 ENCOUNTER — Ambulatory Visit: Payer: Self-pay | Admitting: Physician Assistant

## 2023-11-28 ENCOUNTER — Encounter: Payer: Self-pay | Admitting: Physician Assistant

## 2023-11-28 DIAGNOSIS — Z89512 Acquired absence of left leg below knee: Secondary | ICD-10-CM

## 2023-11-28 NOTE — Progress Notes (Signed)
 Office Visit Note   Patient: [name]           Date of Birth: 09-12-1960           MRN: [id number] Visit Date: 11/28/2023              Requested by: [name], MD [address] [place],  [place] [address] PCP: [name], MD  Chief Complaint  Patient presents with   Left Leg - Routine Post Op    11/18/2023 revision left BKA       HPI: 63 y/o male with recent history of revision on 11/18/23 of his left BKA revision due to Redundant Tissue Left Below Knee Amputation who failed conservative measures and elected for surgical management.  Standing radiographs today demonstrate that the valgus is really coming from redundant tissue below the knee replacement.   He is here today for exam 1 week s/p revision.  He is in a SNF for rehab.  He states he has had a ramp placed and now has a roommate who has a car.  He is ready to go home.  He is waiting on discharge planning with a care managers help.    Assessment & Plan: Visit Diagnoses:  1. S/P BKA (below knee amputation) unilateral, left (HCC)     Plan: 4 x4 guaze ace wrap compression and black stump sock were placed over the left BKA stump.  Activity as tolerates.  Hopefully he will be discharged hme once he is deemed safe fro the SNF.    Follow-Up Instructions: Return in about 2 weeks (around 12/12/2023) for Staple removal as long as the incision is healed.SABRA Beers Exam  Patient is alert, oriented, no adenopathy, well-dressed, normal affect, normal respiratory effort. There are 2 areas of bloody drainage medial and lateral.  They were treated with silver nitrate and compression.  He has ecchymosis medially likely from bloody drainage venous in appearence.  He has no pain with palpation.  The stump is compressible and soft. Staples are intact.  The incision is well approximated.  No ischemic skin changes.       Imaging: No results found. No images are attached to the encounter.  Labs: Lab Results  Component  Value Date   HGBA1C 5.5 03/19/2018   LABURIC 6.5 11/18/2023   REPTSTATUS 11/23/2023 FINAL 11/18/2023   GRAMSTAIN NO WBC SEEN NO ORGANISMS SEEN  11/18/2023   CULT  11/18/2023    No growth aerobically or anaerobically. Performed at [facility], [address], [place], [place] [address]      Lab Results  Component Value Date   ALBUMIN 3.0 (L) 03/14/2018   ALBUMIN 3.9 04/20/2017    No results found for: MG No results found for: VD25OH  No results found for: PREALBUMIN    Latest Ref Rng & Units 11/20/2023    4:06 AM 11/19/2023    3:43 AM 11/18/2023    7:22 AM  CBC EXTENDED  WBC 4.0 - 10.5 K/uL 6.7  8.9  7.7   RBC 4.22 - 5.81 MIL/uL 4.06  4.23  4.74   Hemoglobin 13.0 - 17.0 g/dL 87.1  86.6  85.3   HCT 39.0 - 52.0 % 38.1  40.4  44.5   Platelets 150 - 400 K/uL 213  221  230   NEUT# 1.7 - 7.7 K/uL   5.8   Lymph# 0.7 - 4.0 K/uL   1.2      There is no  height or weight on file to calculate BMI.  Orders:  No orders of the defined types were placed in this encounter.  No orders of the defined types were placed in this encounter.    Procedures: No procedures performed  Clinical Data: No additional findings.  ROS:  All other systems negative, except as noted in the HPI. Review of Systems  Objective: Vital Signs: There were no vitals taken for this visit.  Specialty Comments:  No specialty comments available.  PMFS History: Patient Active Problem List   Diagnosis Date Noted   BKA stump complication (HCC) 11/18/2023   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 (HCC) 11/18/2023   History of left below knee amputation (HCC) 05/31/2022   Abrasion, right knee, initial encounter    Wound infection 03/15/2018   Cellulitis of right anterior lower leg 03/15/2018   History of pulmonary embolus (PE) 03/15/2018   Cellulitis 03/14/2018   S/P ankle fusion 04/20/2017    Post-traumatic osteoarthritis, left ankle and foot    Arthritis of left subtalar joint 10/26/2016   Impingement of left ankle joint 07/14/2016   Ankle impingement syndrome, left    History of total right knee replacement 03/09/2016   Pain in left ankle and joints of left foot 03/09/2016   Past Medical History:  Diagnosis Date   Anxiety    Depression    DVT (deep venous thrombosis) (HCC)    right clavicle and LLE   Factor V Leiden    Fractures    Frontal lobe deficit    Hirschsprung's disease (HCC)    Hypertension    Impingement syndrome of left ankle    MRSA infection    left hip    OA (osteoarthritis)    left ankle   Sleep apnea    wears CPAP    Family History  Problem Relation Age of Onset   Heart attack Father     Past Surgical History:  Procedure Laterality Date   ANKLE ARTHROSCOPY Left 07/14/2016   Procedure: LEFT ANKLE ARTHROSCOPY AND DEBRIDEMENT;  Surgeon: Harden Jerona GAILS, MD;  Location: [facility] OR;  Service: Orthopedics;  Laterality: Left;   ANKLE FUSION Left 04/20/2017   Procedure: LEFT TIBIOCALCANEAL FUSION;  Surgeon: Harden Jerona GAILS, MD;  Location: [facility] OR;  Service: Orthopedics;  Laterality: Left;   COLON SURGERY     Mega colon   COLONOSCOPY     HIP SURGERY Left    At [facility]   I & D EXTREMITY Right 03/17/2018   Procedure: IRRIGATION AND DEBRIDEMENT RIGHT KNEE;  Surgeon: Harden Jerona GAILS, MD;  Location: [facility] OR;  Service: Orthopedics;  Laterality: Right;   KNEE ARTHROPLASTY Bilateral    2009 by chris blackman   KNEE ARTHROSCOPY     LEFT ANKLE SCOPE WITH DEBRIDMENT  Left 07/14/2016   REVISION AMPUTATION, BELOW THE KNEE Left 11/18/2023   Procedure: REVISION BELOW THE KNEE AMPUTATION;  Surgeon: Harden Jerona GAILS, MD;  Location: [facility] OR;  Service: Orthopedics;  Laterality: Left;   Social History   Occupational History   Not on file  Tobacco Use   Smoking status: Former    Current packs/day: 0.50    Average packs/day: 0.5 packs/day for 20.0 years (10.0 ttl pk-yrs)     Types: Cigarettes   Smokeless tobacco: Never  Vaping Use   Vaping status: Never Used  Substance and Sexual Activity   Alcohol use: Not Currently   Drug use: Yes  Types: Marijuana    Comment: hasn't used any in 1 mth   Sexual activity: Not on file

## 2023-12-07 ENCOUNTER — Encounter: Payer: Self-pay | Admitting: Physician Assistant

## 2023-12-07 ENCOUNTER — Ambulatory Visit: Admitting: Physician Assistant

## 2023-12-07 DIAGNOSIS — Z89512 Acquired absence of left leg below knee: Secondary | ICD-10-CM

## 2023-12-07 NOTE — Progress Notes (Signed)
 Office Visit Note   Patient: [name]           Date of Birth: September 21, 1960           MRN: [id number] Visit Date: 12/07/2023              Requested by: [name], MD [address] [place],  [place] [address] PCP: [name], MD  No chief complaint on file.     HPI: 63 y/o male with recent history of revision on 11/18/23 of his left BKA revision due to Redundant Tissue Left Below Knee Amputation who failed conservative measures and elected for surgical management.  He is a little over 2 weeks out from surgery.    He was residing in a SNF for rehab assistance.  He states he has had a ramp placed and now has a roommate who has a car. He is ready to go home. He is waiting on discharge planning with a care managers help. He had moderate edema in the stump with SS drainage on his 1 week visit.  Plan: 4 x4 guaze ace wrap compression and black stump sock were placed over the left BKA stump.  He is home now and sleeps in a recliner.  He states he is elevating while in the recliner.    Assessment & Plan: Visit Diagnoses: No diagnosis found.  Plan: We discussed the need for elevation.  He states he can not lay flat due to chronic back pain.  He will continue to try and elevate his stump daily.  We will do wet to dry Vashe dressing changes with guaze, ace wrap and black stump sock over the stump to help with edema as well.  We will maintain the staples until next week.    Follow-Up Instructions: No follow-ups on file.   Ortho Exam  Patient is alert, oriented, no adenopathy, well-dressed, normal affect, normal respiratory effort. There is superficial incision separation centrally with bloody SS drainage.  He continues to have significant edema in the stump.  Staples are maintained.  Stump is warm and appears viable.      Imaging: No results found. No images are attached to the encounter.  Labs: Lab Results  Component Value Date   HGBA1C 5.5 03/19/2018   LABURIC 6.5  11/18/2023   REPTSTATUS 11/23/2023 FINAL 11/18/2023   GRAMSTAIN NO WBC SEEN NO ORGANISMS SEEN  11/18/2023   CULT  11/18/2023    No growth aerobically or anaerobically. Performed at [facility], [address] [place], [place] [address]      Lab Results  Component Value Date   ALBUMIN 3.0 (L) 03/14/2018   ALBUMIN 3.9 04/20/2017    No results found for: MG No results found for: VD25OH  No results found for: PREALBUMIN    Latest Ref Rng & Units 11/20/2023    4:06 AM 11/19/2023    3:43 AM 11/18/2023    7:22 AM  CBC EXTENDED  WBC 4.0 - 10.5 K/uL 6.7  8.9  7.7   RBC 4.22 - 5.81 MIL/uL 4.06  4.23  4.74   Hemoglobin 13.0 - 17.0 g/dL 87.1  86.6  85.3   HCT 39.0 - 52.0 % 38.1  40.4  44.5   Platelets 150 - 400 K/uL 213  221  230   NEUT# 1.7 - 7.7 K/uL   5.8   Lymph# 0.7 - 4.0 K/uL   1.2      There is no height or weight  on file to calculate BMI.  Orders:  No orders of the defined types were placed in this encounter.  No orders of the defined types were placed in this encounter.    Procedures: No procedures performed  Clinical Data: No additional findings.  ROS:  All other systems negative, except as noted in the HPI. Review of Systems  Objective: Vital Signs: There were no vitals taken for this visit.  Specialty Comments:  No specialty comments available.  PMFS History: Patient Active Problem List   Diagnosis Date Noted   BKA stump complication (HCC) 11/18/2023   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 (HCC) 11/18/2023   History of left below knee amputation (HCC) 05/31/2022   Abrasion, right knee, initial encounter    Wound infection 03/15/2018   Cellulitis of right anterior lower leg 03/15/2018   History of pulmonary embolus (PE) 03/15/2018   Cellulitis 03/14/2018   S/P ankle fusion 04/20/2017   Post-traumatic osteoarthritis, left ankle and foot     Arthritis of left subtalar joint 10/26/2016   Impingement of left ankle joint 07/14/2016   Ankle impingement syndrome, left    History of total right knee replacement 03/09/2016   Pain in left ankle and joints of left foot 03/09/2016   Past Medical History:  Diagnosis Date   Anxiety    Depression    DVT (deep venous thrombosis) (HCC)    right clavicle and LLE   Factor V Leiden    Fractures    Frontal lobe deficit    Hirschsprung's disease (HCC)    Hypertension    Impingement syndrome of left ankle    MRSA infection    left hip    OA (osteoarthritis)    left ankle   Sleep apnea    wears CPAP    Family History  Problem Relation Age of Onset   Heart attack Father     Past Surgical History:  Procedure Laterality Date   ANKLE ARTHROSCOPY Left 07/14/2016   Procedure: LEFT ANKLE ARTHROSCOPY AND DEBRIDEMENT;  Surgeon: Harden Jerona GAILS, MD;  Location: [facility] OR;  Service: Orthopedics;  Laterality: Left;   ANKLE FUSION Left 04/20/2017   Procedure: LEFT TIBIOCALCANEAL FUSION;  Surgeon: Harden Jerona GAILS, MD;  Location: [facility] OR;  Service: Orthopedics;  Laterality: Left;   COLON SURGERY     Mega colon   COLONOSCOPY     HIP SURGERY Left    At [facility]   I & D EXTREMITY Right 03/17/2018   Procedure: IRRIGATION AND DEBRIDEMENT RIGHT KNEE;  Surgeon: Harden Jerona GAILS, MD;  Location: [facility] OR;  Service: Orthopedics;  Laterality: Right;   KNEE ARTHROPLASTY Bilateral    2009 by chris blackman   KNEE ARTHROSCOPY     LEFT ANKLE SCOPE WITH DEBRIDMENT  Left 07/14/2016   REVISION AMPUTATION, BELOW THE KNEE Left 11/18/2023   Procedure: REVISION BELOW THE KNEE AMPUTATION;  Surgeon: Harden Jerona GAILS, MD;  Location: [facility] OR;  Service: Orthopedics;  Laterality: Left;   Social History   Occupational History   Not on file  Tobacco Use   Smoking status: Former    Current packs/day: 0.50    Average packs/day: 0.5 packs/day for 20.0 years (10.0 ttl pk-yrs)    Types: Cigarettes   Smokeless tobacco: Never  Vaping Use    Vaping status: Never Used  Substance and Sexual Activity   Alcohol use: Not Currently   Drug use: Yes  Types: Marijuana    Comment: hasn't used any in 1 mth   Sexual activity: Not on file

## 2023-12-14 ENCOUNTER — Ambulatory Visit: Admitting: Physician Assistant

## 2023-12-14 ENCOUNTER — Encounter: Payer: Self-pay | Admitting: Physician Assistant

## 2023-12-14 DIAGNOSIS — T879 Unspecified complications of amputation stump: Secondary | ICD-10-CM

## 2023-12-14 NOTE — Progress Notes (Signed)
 Office Visit Note   Patient: [name]           Date of Birth: 23-Jul-1960           MRN: [id number] Visit Date: 12/14/2023              Requested by: [name], MD [address] [place],  [place] [address] PCP: [name], MD  Chief Complaint  Patient presents with   Left Leg - Routine Post Op    11/18/23 revision left BKA      HPI: 63 y/o male s/p right BKA revision on 11/18/23.  He has been elevating to decrease the edema and wearing the stump sock.  He is doing a home exercise program.    Assessment & Plan: Visit Diagnoses:  1. BKA stump complication (HCC)     Plan: Staples were removed today and he was given an order for [employer] to fabricate a prosthesis.   Follow-Up Instructions: Return in about 4 weeks (around 01/11/2024).   Ortho Exam  Patient is alert, oriented, no adenopathy, well-dressed, normal affect, normal respiratory effort. Decreased edema in the tissue, incision has healed well and stump appears viable.  No cellulitis or drainage.    Patient is existing left transtibial  amputee that has been revised.    Patient's current comorbidities are not expected to impact the ability to function with the prescribed prosthesis. Patient verbally communicates a strong desire to use a prosthesis. Patient currently requires mobility aids to ambulate without a prosthesis.  Expects not to use mobility aids with a new prosthesis. Patient is expected to resume or reach their K Level within 6 months. Patient was active before the amputation and independent with stairs, uneven terrain, varying cadence, and a community ambulator.  Patient is a K3 level ambulator that spends a lot of time walking around on uneven terrain over obstacles, up and down stairs, and ambulates with a variable cadence.     Imaging: No results found.    Labs: Lab Results  Component Value Date   HGBA1C 5.5 03/19/2018   LABURIC 6.5 11/18/2023   REPTSTATUS 11/23/2023 FINAL  11/18/2023   GRAMSTAIN NO WBC SEEN NO ORGANISMS SEEN  11/18/2023   CULT  11/18/2023    No growth aerobically or anaerobically. Performed at [facility], [address] [place], [place] [address]      Lab Results  Component Value Date   ALBUMIN 3.0 (L) 03/14/2018   ALBUMIN 3.9 04/20/2017    No results found for: MG No results found for: VD25OH  No results found for: PREALBUMIN    Latest Ref Rng & Units 11/20/2023    4:06 AM 11/19/2023    3:43 AM 11/18/2023    7:22 AM  CBC EXTENDED  WBC 4.0 - 10.5 K/uL 6.7  8.9  7.7   RBC 4.22 - 5.81 MIL/uL 4.06  4.23  4.74   Hemoglobin 13.0 - 17.0 g/dL 87.1  86.6  85.3   HCT 39.0 - 52.0 % 38.1  40.4  44.5   Platelets 150 - 400 K/uL 213  221  230   NEUT# 1.7 - 7.7 K/uL   5.8   Lymph# 0.7 - 4.0 K/uL   1.2      There is no height or weight on file to calculate BMI.  Orders:  No orders of the defined types were placed in this encounter.  No orders of the defined types were placed in this encounter.  Procedures: No procedures performed  Clinical Data: No additional findings.  ROS:  All other systems negative, except as noted in the HPI. Review of Systems  Objective: Vital Signs: There were no vitals taken for this visit.  Specialty Comments:  No specialty comments available.  PMFS History: Patient Active Problem List   Diagnosis Date Noted   BKA stump complication (HCC) 11/18/2023   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 (HCC) 11/18/2023   History of left below knee amputation (HCC) 05/31/2022   Abrasion, right knee, initial encounter    Wound infection 03/15/2018   Cellulitis of right anterior lower leg 03/15/2018   History of pulmonary embolus (PE) 03/15/2018   Cellulitis 03/14/2018   S/P ankle fusion 04/20/2017   Post-traumatic osteoarthritis, left ankle and foot    Arthritis of left subtalar joint  10/26/2016   Impingement of left ankle joint 07/14/2016   Ankle impingement syndrome, left    History of total right knee replacement 03/09/2016   Pain in left ankle and joints of left foot 03/09/2016   Past Medical History:  Diagnosis Date   Anxiety    Depression    DVT (deep venous thrombosis) (HCC)    right clavicle and LLE   Factor V Leiden    Fractures    Frontal lobe deficit    Hirschsprung's disease (HCC)    Hypertension    Impingement syndrome of left ankle    MRSA infection    left hip    OA (osteoarthritis)    left ankle   Sleep apnea    wears CPAP    Family History  Problem Relation Age of Onset   Heart attack Father     Past Surgical History:  Procedure Laterality Date   ANKLE ARTHROSCOPY Left 07/14/2016   Procedure: LEFT ANKLE ARTHROSCOPY AND DEBRIDEMENT;  Surgeon: Harden Jerona GAILS, MD;  Location: [facility] OR;  Service: Orthopedics;  Laterality: Left;   ANKLE FUSION Left 04/20/2017   Procedure: LEFT TIBIOCALCANEAL FUSION;  Surgeon: Harden Jerona GAILS, MD;  Location: [facility] OR;  Service: Orthopedics;  Laterality: Left;   COLON SURGERY     Mega colon   COLONOSCOPY     HIP SURGERY Left    At [facility]   I & D EXTREMITY Right 03/17/2018   Procedure: IRRIGATION AND DEBRIDEMENT RIGHT KNEE;  Surgeon: Harden Jerona GAILS, MD;  Location: [facility] OR;  Service: Orthopedics;  Laterality: Right;   KNEE ARTHROPLASTY Bilateral    2009 by chris blackman   KNEE ARTHROSCOPY     LEFT ANKLE SCOPE WITH DEBRIDMENT  Left 07/14/2016   REVISION AMPUTATION, BELOW THE KNEE Left 11/18/2023   Procedure: REVISION BELOW THE KNEE AMPUTATION;  Surgeon: Harden Jerona GAILS, MD;  Location: [facility] OR;  Service: Orthopedics;  Laterality: Left;   Social History   Occupational History   Not on file  Tobacco Use   Smoking status: Former    Current packs/day: 0.50    Average packs/day: 0.5 packs/day for 20.0 years (10.0 ttl pk-yrs)    Types: Cigarettes   Smokeless tobacco: Never  Vaping Use   Vaping status: Never Used   Substance and Sexual Activity   Alcohol use: Not Currently   Drug use: Yes    Types: Marijuana    Comment: hasn't used any in 1 mth   Sexual activity: Not on file

## 2023-12-19 ENCOUNTER — Encounter: Payer: Self-pay | Admitting: Orthopedic Surgery

## 2023-12-19 ENCOUNTER — Telehealth: Payer: Self-pay | Admitting: Physician Assistant

## 2023-12-19 NOTE — Telephone Encounter (Signed)
Letter written and ready to pick up.

## 2023-12-19 NOTE — Telephone Encounter (Signed)
Pt informed

## 2023-12-19 NOTE — Telephone Encounter (Signed)
 Pt called asking a letter for social services to say he has a primary care giver that volunteer to help pt around the house with choirs, helping pt with dressing and giving transportation. Please call pt about this and when letter is ready for pick up. Pt states Social Services said if he can get letter that they will give him extra benefits such as food stamps ect. Please call pt at [phone].

## 2023-12-30 ENCOUNTER — Telehealth: Payer: Self-pay | Admitting: Orthopedic Surgery

## 2023-12-30 NOTE — Telephone Encounter (Signed)
 Pt called saying that he needs a letter for Social Services for him and his roommate to get food stamps Dr. Harden  He is saying that both his and his roommates name need to be on the form. His care givers name is Garnette Rigg. Garnette is his primary care giver. Call back number is [phone]. He says they can either be mailed to him or he can come by and pick them up.

## 2024-01-04 ENCOUNTER — Other Ambulatory Visit: Payer: Self-pay

## 2024-01-04 NOTE — Telephone Encounter (Signed)
 The pt is s/p a left BKA revision in October letter written to advise he is under care in the process of obtaining a prosthetic and that he has an onsite primary care giver Garnette Rigg that assists with his ADLs. Pt is aware that note is ready and will come to pick up from the office.

## 2024-01-11 ENCOUNTER — Encounter: Payer: Self-pay | Admitting: Family

## 2024-01-11 ENCOUNTER — Ambulatory Visit: Admitting: Physician Assistant

## 2024-01-11 DIAGNOSIS — S88112D Complete traumatic amputation at level between knee and ankle, left lower leg, subsequent encounter: Secondary | ICD-10-CM

## 2024-01-11 DIAGNOSIS — Z89512 Acquired absence of left leg below knee: Secondary | ICD-10-CM

## 2024-01-11 NOTE — Progress Notes (Signed)
 Post-Op Visit Note   Patient: [name]           Date of Birth: 1960/11/04           MRN: [id number] Visit Date: 01/11/2024 PCP: [name], MD  Chief Complaint:  Chief Complaint  Patient presents with   Left Leg - Routine Post Op    11/18/23 revision left BKA    HPI:  HPI the patient is a 63 year old gentleman seen status post revision left below-knee amputation October 24 of this year.  He has been molded for his prosthesis and picks this up later this week Ortho Exam On examination left residual limb this is well healed.  Trace edema present there is no erythema no exposed suture material no retained staple or suture.  Visit Diagnoses: No diagnosis found.  Plan: He will proceed with prosthesis set up and gait training at [facility] follow-up in the office as needed  Follow-Up Instructions: No follow-ups on file.   Imaging: No results found.  Orders:  No orders of the defined types were placed in this encounter.  No orders of the defined types were placed in this encounter.    PMFS History: Patient Active Problem List   Diagnosis Date Noted   BKA stump complication (HCC) 11/18/2023   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 (HCC) 11/18/2023   History of left below knee amputation (HCC) 05/31/2022   Abrasion, right knee, initial encounter    Wound infection 03/15/2018   Cellulitis of right anterior lower leg 03/15/2018   History of pulmonary embolus (PE) 03/15/2018   Cellulitis 03/14/2018   S/P ankle fusion 04/20/2017   Post-traumatic osteoarthritis, left ankle and foot    Arthritis of left subtalar joint 10/26/2016   Impingement of left ankle joint 07/14/2016   Ankle impingement syndrome, left    History of total right knee replacement 03/09/2016   Pain in left ankle and joints of left foot 03/09/2016   Past Medical History:  Diagnosis Date   Anxiety     Depression    DVT (deep venous thrombosis) (HCC)    right clavicle and LLE   Factor V Leiden    Fractures    Frontal lobe deficit    Hirschsprung's disease (HCC)    Hypertension    Impingement syndrome of left ankle    MRSA infection    left hip    OA (osteoarthritis)    left ankle   Sleep apnea    wears CPAP    Family History  Problem Relation Age of Onset   Heart attack Father     Past Surgical History:  Procedure Laterality Date   ANKLE ARTHROSCOPY Left 07/14/2016   Procedure: LEFT ANKLE ARTHROSCOPY AND DEBRIDEMENT;  Surgeon: Harden Jerona GAILS, MD;  Location: [facility] OR;  Service: Orthopedics;  Laterality: Left;   ANKLE FUSION Left 04/20/2017   Procedure: LEFT TIBIOCALCANEAL FUSION;  Surgeon: Harden Jerona GAILS, MD;  Location: [facility] OR;  Service: Orthopedics;  Laterality: Left;   COLON SURGERY     Mega colon   COLONOSCOPY     HIP SURGERY Left    At [facility]   I & D EXTREMITY Right 03/17/2018   Procedure: IRRIGATION AND DEBRIDEMENT RIGHT KNEE;  Surgeon: Harden Jerona GAILS, MD;  Location: [facility] OR;  Service: Orthopedics;  Laterality: Right;   KNEE ARTHROPLASTY Bilateral    2009 by chris blackman   KNEE ARTHROSCOPY  LEFT ANKLE SCOPE WITH DEBRIDMENT  Left 07/14/2016   REVISION AMPUTATION, BELOW THE KNEE Left 11/18/2023   Procedure: REVISION BELOW THE KNEE AMPUTATION;  Surgeon: Harden Jerona GAILS, MD;  Location: [facility] OR;  Service: Orthopedics;  Laterality: Left;   Social History   Occupational History   Not on file  Tobacco Use   Smoking status: Former    Current packs/day: 0.50    Average packs/day: 0.5 packs/day for 20.0 years (10.0 ttl pk-yrs)    Types: Cigarettes   Smokeless tobacco: Never  Vaping Use   Vaping status: Never Used  Substance and Sexual Activity   Alcohol use: Not Currently   Drug use: Yes    Types: Marijuana    Comment: hasn't used any in 1 mth   Sexual activity: Not on file

## 2024-02-01 ENCOUNTER — Inpatient Hospital Stay (HOSPITAL_COMMUNITY)
Admission: EM | Admit: 2024-02-01 | Discharge: 2024-02-08 | DRG: 336 | Disposition: A | Discharge status: Home / Self Care | Attending: Internal Medicine | Admitting: Internal Medicine

## 2024-02-01 ENCOUNTER — Emergency Department (HOSPITAL_COMMUNITY)

## 2024-02-01 ENCOUNTER — Other Ambulatory Visit: Payer: Self-pay

## 2024-02-01 ENCOUNTER — Encounter (HOSPITAL_COMMUNITY): Payer: Self-pay

## 2024-02-01 DIAGNOSIS — N400 Enlarged prostate without lower urinary tract symptoms: Secondary | ICD-10-CM | POA: Diagnosis present

## 2024-02-01 DIAGNOSIS — E66813 Obesity, class 3: Secondary | ICD-10-CM | POA: Diagnosis present

## 2024-02-01 DIAGNOSIS — Z6839 Body mass index (BMI) 39.0-39.9, adult: Secondary | ICD-10-CM

## 2024-02-01 DIAGNOSIS — F32A Depression, unspecified: Secondary | ICD-10-CM | POA: Diagnosis present

## 2024-02-01 DIAGNOSIS — E876 Hypokalemia: Secondary | ICD-10-CM | POA: Diagnosis present

## 2024-02-01 DIAGNOSIS — E871 Hypo-osmolality and hyponatremia: Secondary | ICD-10-CM | POA: Diagnosis present

## 2024-02-01 DIAGNOSIS — D6851 Activated protein C resistance: Secondary | ICD-10-CM | POA: Diagnosis present

## 2024-02-01 DIAGNOSIS — Z79899 Other long term (current) drug therapy: Secondary | ICD-10-CM

## 2024-02-01 DIAGNOSIS — E785 Hyperlipidemia, unspecified: Secondary | ICD-10-CM | POA: Diagnosis present

## 2024-02-01 DIAGNOSIS — Q431 Hirschsprung's disease: Secondary | ICD-10-CM

## 2024-02-01 DIAGNOSIS — K460 Unspecified abdominal hernia with obstruction, without gangrene: Principal | ICD-10-CM | POA: Diagnosis present

## 2024-02-01 DIAGNOSIS — Z8249 Family history of ischemic heart disease and other diseases of the circulatory system: Secondary | ICD-10-CM

## 2024-02-01 DIAGNOSIS — K56609 Unspecified intestinal obstruction, unspecified as to partial versus complete obstruction: Principal | ICD-10-CM | POA: Diagnosis present

## 2024-02-01 DIAGNOSIS — Z89512 Acquired absence of left leg below knee: Secondary | ICD-10-CM

## 2024-02-01 DIAGNOSIS — K660 Peritoneal adhesions (postprocedural) (postinfection): Secondary | ICD-10-CM | POA: Diagnosis present

## 2024-02-01 DIAGNOSIS — G4733 Obstructive sleep apnea (adult) (pediatric): Secondary | ICD-10-CM | POA: Diagnosis present

## 2024-02-01 DIAGNOSIS — I10 Essential (primary) hypertension: Secondary | ICD-10-CM | POA: Diagnosis present

## 2024-02-01 DIAGNOSIS — Z96653 Presence of artificial knee joint, bilateral: Secondary | ICD-10-CM | POA: Diagnosis present

## 2024-02-01 DIAGNOSIS — Z87891 Personal history of nicotine dependence: Secondary | ICD-10-CM

## 2024-02-01 DIAGNOSIS — Z981 Arthrodesis status: Secondary | ICD-10-CM

## 2024-02-01 DIAGNOSIS — Z7901 Long term (current) use of anticoagulants: Secondary | ICD-10-CM

## 2024-02-01 DIAGNOSIS — E878 Other disorders of electrolyte and fluid balance, not elsewhere classified: Secondary | ICD-10-CM | POA: Diagnosis present

## 2024-02-01 DIAGNOSIS — Z86718 Personal history of other venous thrombosis and embolism: Secondary | ICD-10-CM

## 2024-02-01 DIAGNOSIS — T45515A Adverse effect of anticoagulants, initial encounter: Secondary | ICD-10-CM | POA: Diagnosis present

## 2024-02-01 LAB — CBC WITH DIFFERENTIAL/PLATELET
Abs Immature Granulocytes: 0.07 K/uL (ref 0.00–0.07)
Basophils Absolute: 0 K/uL (ref 0.0–0.1)
Basophils Relative: 0 %
Eosinophils Absolute: 0 K/uL (ref 0.0–0.5)
Eosinophils Relative: 0 %
HCT: 43.1 % (ref 39.0–52.0)
Hemoglobin: 14.5 g/dL (ref 13.0–17.0)
Immature Granulocytes: 1 %
Lymphocytes Relative: 12 %
Lymphs Abs: 1.6 K/uL (ref 0.7–4.0)
MCH: 30.9 pg (ref 26.0–34.0)
MCHC: 33.6 g/dL (ref 30.0–36.0)
MCV: 91.9 fL (ref 80.0–100.0)
Monocytes Absolute: 0.9 K/uL (ref 0.1–1.0)
Monocytes Relative: 7 %
Neutro Abs: 10.5 K/uL — ABNORMAL HIGH (ref 1.7–7.7)
Neutrophils Relative %: 80 %
Platelets: 213 K/uL (ref 150–400)
RBC: 4.69 MIL/uL (ref 4.22–5.81)
RDW: 13.2 % (ref 11.5–15.5)
WBC: 13.2 K/uL — ABNORMAL HIGH (ref 4.0–10.5)
nRBC: 0 % (ref 0.0–0.2)

## 2024-02-01 LAB — COMPREHENSIVE METABOLIC PANEL WITH GFR
ALT: 48 U/L — ABNORMAL HIGH (ref 0–44)
AST: 114 U/L — ABNORMAL HIGH (ref 15–41)
Albumin: 3.5 g/dL (ref 3.5–5.0)
Alkaline Phosphatase: 73 U/L (ref 38–126)
Anion gap: 10 (ref 5–15)
BUN: 15 mg/dL (ref 8–23)
CO2: 26 mmol/L (ref 22–32)
Calcium: 9.2 mg/dL (ref 8.9–10.3)
Chloride: 97 mmol/L — ABNORMAL LOW (ref 98–111)
Creatinine, Ser: 0.74 mg/dL (ref 0.61–1.24)
GFR, Estimated: >60 mL/min
Glucose, Bld: 94 mg/dL (ref 70–99)
Potassium: 4 mmol/L (ref 3.5–5.1)
Sodium: 133 mmol/L — ABNORMAL LOW (ref 135–145)
Total Bilirubin: 0.3 mg/dL (ref 0.0–1.2)
Total Protein: 7 g/dL (ref 6.5–8.1)

## 2024-02-01 LAB — I-STAT CHEM 8, ED
BUN: 15 mg/dL (ref 8–23)
Calcium, Ion: 1.13 mmol/L — ABNORMAL LOW (ref 1.15–1.40)
Chloride: 98 mmol/L (ref 98–111)
Creatinine, Ser: 0.8 mg/dL (ref 0.61–1.24)
Glucose, Bld: 97 mg/dL (ref 70–99)
HCT: 45 % (ref 39.0–52.0)
Hemoglobin: 15.3 g/dL (ref 13.0–17.0)
Potassium: 3.6 mmol/L (ref 3.5–5.1)
Sodium: 135 mmol/L (ref 135–145)
TCO2: 24 mmol/L (ref 22–32)

## 2024-02-01 LAB — LIPASE, BLOOD: Lipase: 15 U/L (ref 11–51)

## 2024-02-01 MED ORDER — IOHEXOL 350 MG/ML SOLN
100.0000 mL | Freq: Once | INTRAVENOUS | Status: AC | PRN
  Administered 2024-02-01: 100 mL via INTRAVENOUS

## 2024-02-01 NOTE — ED Provider Triage Note (Signed)
 Emergency Medicine Provider Triage Evaluation Note  [name] , a 64 y.o. male  was evaluated in triage.  Pt complains of nausea, vomiting, abdominal pain.  Recently diagnosed with intestinal blockage  Review of Systems  Positive: Nausea, vomiting, abdominal pain Negative: Fever, chills, cough, congestion, chest pain, shortness of breath  Physical Exam  BP 110/61 (BP Location: Left Arm)   Pulse 81   Temp 98.2 F (36.8 C)   Resp 16   Ht 5' 11 (1.803 m)   Wt 129.7 kg   SpO2 94%   BMI 39.89 kg/m  Gen:   Awake, no distress   Resp:  Normal effort  MSK:   Moves extremities without difficulty  Other:    Medical Decision Making  Medically screening exam initiated at 1:32 PM.  Appropriate orders placed.  [name] was informed that the remainder of the evaluation will be completed by another provider, this initial triage assessment does not replace that evaluation, and the importance of remaining in the ED until their evaluation is complete.  Labs and imaging ordered   [name] 02/01/24 1333

## 2024-02-01 NOTE — ED Triage Notes (Signed)
 Pt. Stated, I just got out of the hospital in [place] for a week for stomach pain and a blockage. Im back to here Im having still stomach pain with N/V , unable to eat

## 2024-02-01 NOTE — ED Triage Notes (Signed)
 He states that he has a blockage in his intestine, he still has BMs everyday. He states that he is still weak and unable to eat or drink.

## 2024-02-02 ENCOUNTER — Inpatient Hospital Stay (HOSPITAL_COMMUNITY): Admitting: Registered Nurse

## 2024-02-02 ENCOUNTER — Emergency Department (HOSPITAL_COMMUNITY)

## 2024-02-02 ENCOUNTER — Encounter (HOSPITAL_COMMUNITY)
Admission: EM | Disposition: A | Discharge status: Home Health | Payer: Self-pay | Source: Home / Self Care | Attending: Internal Medicine

## 2024-02-02 ENCOUNTER — Encounter (HOSPITAL_COMMUNITY): Payer: Self-pay | Admitting: Internal Medicine

## 2024-02-02 DIAGNOSIS — E66813 Obesity, class 3: Secondary | ICD-10-CM | POA: Diagnosis present

## 2024-02-02 DIAGNOSIS — N400 Enlarged prostate without lower urinary tract symptoms: Secondary | ICD-10-CM | POA: Diagnosis present

## 2024-02-02 DIAGNOSIS — K56609 Unspecified intestinal obstruction, unspecified as to partial versus complete obstruction: Principal | ICD-10-CM | POA: Diagnosis present

## 2024-02-02 DIAGNOSIS — Z96653 Presence of artificial knee joint, bilateral: Secondary | ICD-10-CM | POA: Diagnosis present

## 2024-02-02 DIAGNOSIS — E871 Hypo-osmolality and hyponatremia: Secondary | ICD-10-CM | POA: Diagnosis present

## 2024-02-02 DIAGNOSIS — Z89512 Acquired absence of left leg below knee: Secondary | ICD-10-CM | POA: Diagnosis not present

## 2024-02-02 DIAGNOSIS — R112 Nausea with vomiting, unspecified: Secondary | ICD-10-CM | POA: Diagnosis present

## 2024-02-02 DIAGNOSIS — Z87891 Personal history of nicotine dependence: Secondary | ICD-10-CM

## 2024-02-02 DIAGNOSIS — K660 Peritoneal adhesions (postprocedural) (postinfection): Secondary | ICD-10-CM | POA: Diagnosis present

## 2024-02-02 DIAGNOSIS — Z981 Arthrodesis status: Secondary | ICD-10-CM | POA: Diagnosis not present

## 2024-02-02 DIAGNOSIS — Q431 Hirschsprung's disease: Secondary | ICD-10-CM | POA: Diagnosis not present

## 2024-02-02 DIAGNOSIS — Z8249 Family history of ischemic heart disease and other diseases of the circulatory system: Secondary | ICD-10-CM | POA: Diagnosis not present

## 2024-02-02 DIAGNOSIS — E785 Hyperlipidemia, unspecified: Secondary | ICD-10-CM | POA: Diagnosis present

## 2024-02-02 DIAGNOSIS — I10 Essential (primary) hypertension: Secondary | ICD-10-CM | POA: Diagnosis present

## 2024-02-02 DIAGNOSIS — Z7901 Long term (current) use of anticoagulants: Secondary | ICD-10-CM | POA: Diagnosis not present

## 2024-02-02 DIAGNOSIS — I824Y9 Acute embolism and thrombosis of unspecified deep veins of unspecified proximal lower extremity: Secondary | ICD-10-CM | POA: Diagnosis not present

## 2024-02-02 DIAGNOSIS — Z79899 Other long term (current) drug therapy: Secondary | ICD-10-CM | POA: Diagnosis not present

## 2024-02-02 DIAGNOSIS — T45515A Adverse effect of anticoagulants, initial encounter: Secondary | ICD-10-CM | POA: Diagnosis present

## 2024-02-02 DIAGNOSIS — E669 Obesity, unspecified: Secondary | ICD-10-CM | POA: Diagnosis not present

## 2024-02-02 DIAGNOSIS — E876 Hypokalemia: Secondary | ICD-10-CM | POA: Diagnosis present

## 2024-02-02 DIAGNOSIS — D6851 Activated protein C resistance: Secondary | ICD-10-CM | POA: Diagnosis present

## 2024-02-02 DIAGNOSIS — E878 Other disorders of electrolyte and fluid balance, not elsewhere classified: Secondary | ICD-10-CM | POA: Diagnosis present

## 2024-02-02 DIAGNOSIS — F418 Other specified anxiety disorders: Secondary | ICD-10-CM

## 2024-02-02 DIAGNOSIS — Z6839 Body mass index (BMI) 39.0-39.9, adult: Secondary | ICD-10-CM | POA: Diagnosis not present

## 2024-02-02 DIAGNOSIS — G4733 Obstructive sleep apnea (adult) (pediatric): Secondary | ICD-10-CM | POA: Diagnosis present

## 2024-02-02 DIAGNOSIS — K460 Unspecified abdominal hernia with obstruction, without gangrene: Secondary | ICD-10-CM | POA: Diagnosis present

## 2024-02-02 DIAGNOSIS — Z86718 Personal history of other venous thrombosis and embolism: Secondary | ICD-10-CM | POA: Diagnosis not present

## 2024-02-02 DIAGNOSIS — F32A Depression, unspecified: Secondary | ICD-10-CM | POA: Diagnosis present

## 2024-02-02 HISTORY — PX: LAPAROTOMY: SHX154

## 2024-02-02 LAB — COMPREHENSIVE METABOLIC PANEL WITH GFR
ALT: 50 U/L — ABNORMAL HIGH (ref 0–44)
AST: 108 U/L — ABNORMAL HIGH (ref 15–41)
Albumin: 3.7 g/dL (ref 3.5–5.0)
Alkaline Phosphatase: 74 U/L (ref 38–126)
Anion gap: 11 (ref 5–15)
BUN: 13 mg/dL (ref 8–23)
CO2: 27 mmol/L (ref 22–32)
Calcium: 9.3 mg/dL (ref 8.9–10.3)
Chloride: 99 mmol/L (ref 98–111)
Creatinine, Ser: 0.71 mg/dL (ref 0.61–1.24)
GFR, Estimated: >60 mL/min
Glucose, Bld: 95 mg/dL (ref 70–99)
Potassium: 3.5 mmol/L (ref 3.5–5.1)
Sodium: 137 mmol/L (ref 135–145)
Total Bilirubin: 0.5 mg/dL (ref 0.0–1.2)
Total Protein: 7 g/dL (ref 6.5–8.1)

## 2024-02-02 LAB — CBC
HCT: 43.8 % (ref 39.0–52.0)
Hemoglobin: 14.3 g/dL (ref 13.0–17.0)
MCH: 30.4 pg (ref 26.0–34.0)
MCHC: 32.6 g/dL (ref 30.0–36.0)
MCV: 93.2 fL (ref 80.0–100.0)
Platelets: 213 K/uL (ref 150–400)
RBC: 4.7 MIL/uL (ref 4.22–5.81)
RDW: 13.1 % (ref 11.5–15.5)
WBC: 8.7 K/uL (ref 4.0–10.5)
nRBC: 0 % (ref 0.0–0.2)

## 2024-02-02 LAB — TYPE AND SCREEN
ABO/RH(D): A POS
Antibody Screen: NEGATIVE

## 2024-02-02 LAB — PROTIME-INR
INR: 4 — ABNORMAL HIGH (ref 0.8–1.2)
INR: 1.2 (ref 0.8–1.2)
Prothrombin Time: 40.4 s — ABNORMAL HIGH (ref 11.4–15.2)
Prothrombin Time: 15.4 s — ABNORMAL HIGH (ref 11.4–15.2)

## 2024-02-02 LAB — MRSA NEXT GEN BY PCR, NASAL: MRSA by PCR Next Gen: NOT DETECTED

## 2024-02-02 LAB — I-STAT CG4 LACTIC ACID, ED
Lactic Acid, Venous: 1.5 mmol/L (ref 0.5–1.9)
Lactic Acid, Venous: 0.8 mmol/L (ref 0.5–1.9)

## 2024-02-02 MED ORDER — FENTANYL CITRATE (PF) 100 MCG/2ML IJ SOLN
25.0000 ug | INTRAMUSCULAR | Status: DC | PRN
  Administered 2024-02-02: 50 ug via INTRAVENOUS
  Administered 2024-02-02 (×2): 25 ug via INTRAVENOUS

## 2024-02-02 MED ORDER — PROPOFOL 10 MG/ML IV BOLUS
INTRAVENOUS | Status: DC | PRN
  Administered 2024-02-02: 200 mg via INTRAVENOUS

## 2024-02-02 MED ORDER — WARFARIN SODIUM 7.5 MG PO TABS: 7.5000 mg | ORAL_TABLET | Freq: Every day | ORAL | Status: DC

## 2024-02-02 MED ORDER — PROTHROMBIN COMPLEX CONC HUMAN 500 UNITS IV KIT: 2000.0000 [IU] | PACK | Status: DC

## 2024-02-02 MED ORDER — ATORVASTATIN CALCIUM 10 MG PO TABS
20.0000 mg | ORAL_TABLET | Freq: Every day | ORAL | Status: DC
  Administered 2024-02-02 – 2024-02-08 (×7): 20 mg via ORAL
  Filled 2024-02-02 (×7): qty 2

## 2024-02-02 MED ORDER — KETAMINE HCL 50 MG/5ML IJ SOSY
PREFILLED_SYRINGE | INTRAMUSCULAR | Status: AC
  Filled 2024-02-02: qty 5

## 2024-02-02 MED ORDER — FENTANYL CITRATE (PF) 250 MCG/5ML IJ SOLN
INTRAMUSCULAR | Status: DC | PRN
  Administered 2024-02-02 (×2): 50 ug via INTRAVENOUS

## 2024-02-02 MED ORDER — ALBUMIN HUMAN 5 % IV SOLN: INTRAVENOUS | Status: DC | PRN

## 2024-02-02 MED ORDER — MIDAZOLAM HCL (PF) 2 MG/2ML IJ SOLN
INTRAMUSCULAR | Status: DC | PRN
  Administered 2024-02-02: 2 mg via INTRAVENOUS

## 2024-02-02 MED ORDER — EPHEDRINE 5 MG/ML INJ
INTRAVENOUS | Status: AC
  Filled 2024-02-02: qty 5

## 2024-02-02 MED ORDER — PHENYLEPHRINE HCL-NACL 20-0.9 MG/250ML-% IV SOLN
INTRAVENOUS | Status: DC | PRN
  Administered 2024-02-02: 50 ug/min via INTRAVENOUS

## 2024-02-02 MED ORDER — ONDANSETRON HCL 4 MG/2ML IJ SOLN: 4.0000 mg | Freq: Four times a day (QID) | INTRAMUSCULAR | Status: DC | PRN

## 2024-02-02 MED ORDER — LACTATED RINGERS IV SOLN: INTRAVENOUS | Status: DC

## 2024-02-02 MED ORDER — GABAPENTIN 100 MG PO CAPS
200.0000 mg | ORAL_CAPSULE | Freq: Three times a day (TID) | ORAL | Status: DC
  Administered 2024-02-02 – 2024-02-08 (×18): 200 mg via ORAL
  Filled 2024-02-02 (×18): qty 2

## 2024-02-02 MED ORDER — SUCCINYLCHOLINE CHLORIDE 200 MG/10ML IV SOSY
PREFILLED_SYRINGE | INTRAVENOUS | Status: DC | PRN
  Administered 2024-02-02: 140 mg via INTRAVENOUS

## 2024-02-02 MED ORDER — DIATRIZOATE MEGLUMINE & SODIUM 66-10 % PO SOLN: 90.0000 mL | Freq: Once | ORAL | Status: DC

## 2024-02-02 MED ORDER — BUSPIRONE HCL 5 MG PO TABS
15.0000 mg | ORAL_TABLET | Freq: Three times a day (TID) | ORAL | Status: DC
  Administered 2024-02-02 – 2024-02-08 (×18): 15 mg via ORAL
  Filled 2024-02-02 (×18): qty 1

## 2024-02-02 MED ORDER — CARIPRAZINE HCL 1.5 MG PO CAPS
3.0000 mg | ORAL_CAPSULE | Freq: Every morning | ORAL | Status: DC
  Administered 2024-02-03 – 2024-02-08 (×6): 3 mg via ORAL
  Filled 2024-02-02 (×6): qty 2

## 2024-02-02 MED ORDER — CHLORHEXIDINE GLUCONATE 0.12 % MT SOLN: 15.0000 mL | Freq: Once | OROMUCOSAL | Status: AC

## 2024-02-02 MED ORDER — KETAMINE HCL 10 MG/ML IJ SOLN
INTRAMUSCULAR | Status: DC | PRN
  Administered 2024-02-02: 50 mg via INTRAVENOUS

## 2024-02-02 MED ORDER — ROPINIROLE HCL 0.5 MG PO TABS
0.5000 mg | ORAL_TABLET | Freq: Every day | ORAL | Status: DC
  Administered 2024-02-02 – 2024-02-07 (×6): 0.5 mg via ORAL
  Filled 2024-02-02 (×6): qty 1

## 2024-02-02 MED ORDER — TRAZODONE HCL 50 MG PO TABS
200.0000 mg | ORAL_TABLET | Freq: Every day | ORAL | Status: DC
  Administered 2024-02-02 – 2024-02-07 (×6): 200 mg via ORAL
  Filled 2024-02-02 (×6): qty 4

## 2024-02-02 MED ORDER — FENTANYL CITRATE (PF) 100 MCG/2ML IJ SOLN: 25.0000 ug | INTRAMUSCULAR | Status: DC | PRN

## 2024-02-02 MED ORDER — ACETAMINOPHEN 10 MG/ML IV SOLN
INTRAVENOUS | Status: AC
  Filled 2024-02-02: qty 100

## 2024-02-02 MED ORDER — FENTANYL CITRATE (PF) 250 MCG/5ML IJ SOLN
INTRAMUSCULAR | Status: AC
  Filled 2024-02-02: qty 5

## 2024-02-02 MED ORDER — ONDANSETRON 4 MG PO TBDP: 4.0000 mg | ORAL_TABLET | Freq: Four times a day (QID) | ORAL | Status: DC | PRN

## 2024-02-02 MED ORDER — ORAL CARE MOUTH RINSE: 15.0000 mL | Freq: Once | OROMUCOSAL | Status: AC

## 2024-02-02 MED ORDER — PROPOFOL 10 MG/ML IV BOLUS
INTRAVENOUS | Status: AC
  Filled 2024-02-02: qty 20

## 2024-02-02 MED ORDER — 0.9 % SODIUM CHLORIDE (POUR BTL) OPTIME
TOPICAL | Status: DC | PRN
  Administered 2024-02-02: 2000 mL

## 2024-02-02 MED ORDER — ONDANSETRON HCL 4 MG/2ML IJ SOLN
INTRAMUSCULAR | Status: DC | PRN
  Administered 2024-02-02: 4 mg via INTRAVENOUS

## 2024-02-02 MED ORDER — PHENYLEPHRINE 80 MCG/ML (10ML) SYRINGE FOR IV PUSH (FOR BLOOD PRESSURE SUPPORT)
PREFILLED_SYRINGE | INTRAVENOUS | Status: DC | PRN
  Administered 2024-02-02 (×3): 160 ug via INTRAVENOUS

## 2024-02-02 MED ORDER — ROCURONIUM BROMIDE 10 MG/ML (PF) SYRINGE
PREFILLED_SYRINGE | INTRAVENOUS | Status: DC | PRN
  Administered 2024-02-02: 20 mg via INTRAVENOUS
  Administered 2024-02-02: 40 mg via INTRAVENOUS

## 2024-02-02 MED ORDER — PHENYLEPHRINE 80 MCG/ML (10ML) SYRINGE FOR IV PUSH (FOR BLOOD PRESSURE SUPPORT)
PREFILLED_SYRINGE | INTRAVENOUS | Status: AC
  Filled 2024-02-02: qty 10

## 2024-02-02 MED ORDER — ROCURONIUM BROMIDE 10 MG/ML (PF) SYRINGE
PREFILLED_SYRINGE | INTRAVENOUS | Status: AC
  Filled 2024-02-02: qty 20

## 2024-02-02 MED ORDER — ACETAMINOPHEN 10 MG/ML IV SOLN
1000.0000 mg | Freq: Once | INTRAVENOUS | Status: DC | PRN
  Administered 2024-02-02: 1000 mg via INTRAVENOUS

## 2024-02-02 MED ORDER — DEXTROSE-SODIUM CHLORIDE 5-0.9 % IV SOLN: INTRAVENOUS | Status: AC

## 2024-02-02 MED ORDER — FLUOXETINE HCL 20 MG PO CAPS
40.0000 mg | ORAL_CAPSULE | Freq: Every day | ORAL | Status: DC
  Administered 2024-02-02 – 2024-02-08 (×7): 40 mg via ORAL
  Filled 2024-02-02 (×7): qty 2

## 2024-02-02 MED ORDER — HYDROMORPHONE HCL 1 MG/ML IJ SOLN
2.0000 mg | INTRAMUSCULAR | Status: DC | PRN
  Administered 2024-02-02 – 2024-02-04 (×6): 2 mg via INTRAVENOUS
  Filled 2024-02-02 (×6): qty 2

## 2024-02-02 MED ORDER — LIDOCAINE 2% (20 MG/ML) 5 ML SYRINGE
INTRAMUSCULAR | Status: DC | PRN
  Administered 2024-02-02: 100 mg via INTRAVENOUS

## 2024-02-02 MED ORDER — DROPERIDOL 2.5 MG/ML IJ SOLN: 0.6250 mg | Freq: Once | INTRAMUSCULAR | Status: DC | PRN

## 2024-02-02 MED ORDER — OXYCODONE HCL 5 MG PO TABS: 5.0000 mg | ORAL_TABLET | Freq: Once | ORAL | Status: DC | PRN

## 2024-02-02 MED ORDER — SUCCINYLCHOLINE CHLORIDE 200 MG/10ML IV SOSY
PREFILLED_SYRINGE | INTRAVENOUS | Status: AC
  Filled 2024-02-02: qty 10

## 2024-02-02 MED ORDER — ACETAMINOPHEN 325 MG PO TABS: 650.0000 mg | ORAL_TABLET | Freq: Three times a day (TID) | ORAL | Status: DC | PRN

## 2024-02-02 MED ORDER — SUGAMMADEX SODIUM 200 MG/2ML IV SOLN
INTRAVENOUS | Status: DC | PRN
  Administered 2024-02-02: 500 mg via INTRAVENOUS

## 2024-02-02 MED ORDER — TEMAZEPAM 15 MG PO CAPS
30.0000 mg | ORAL_CAPSULE | Freq: Every day | ORAL | Status: DC
  Administered 2024-02-02 – 2024-02-07 (×6): 30 mg via ORAL
  Filled 2024-02-02 (×6): qty 2

## 2024-02-02 MED ORDER — VITAMIN K1 10 MG/ML IJ SOLN
10.0000 mg | INTRAVENOUS | Status: AC
  Administered 2024-02-02: 10 mg via INTRAVENOUS
  Filled 2024-02-02: qty 1

## 2024-02-02 MED ORDER — DEXAMETHASONE SOD PHOSPHATE PF 10 MG/ML IJ SOLN
INTRAMUSCULAR | Status: DC | PRN
  Administered 2024-02-02: 5 mg via INTRAVENOUS

## 2024-02-02 MED ORDER — PROTHROMBIN COMPLEX CONC HUMAN 500 UNITS IV KIT
3414.0000 [IU] | PACK | Status: AC
  Administered 2024-02-02: 3414 [IU] via INTRAVENOUS
  Filled 2024-02-02: qty 3414

## 2024-02-02 MED ORDER — MIDAZOLAM HCL 2 MG/2ML IJ SOLN
INTRAMUSCULAR | Status: AC
  Filled 2024-02-02: qty 2

## 2024-02-02 MED ORDER — CEFAZOLIN SODIUM-DEXTROSE 3-4 GM/150ML-% IV SOLN
3.0000 g | INTRAVENOUS | Status: AC
  Administered 2024-02-02: 3 g via INTRAVENOUS
  Filled 2024-02-02: qty 150

## 2024-02-02 MED ORDER — TAMSULOSIN HCL 0.4 MG PO CAPS
0.4000 mg | ORAL_CAPSULE | Freq: Every day | ORAL | Status: DC
  Administered 2024-02-02 – 2024-02-07 (×6): 0.4 mg via ORAL
  Filled 2024-02-02 (×6): qty 1

## 2024-02-02 MED ORDER — CHLORHEXIDINE GLUCONATE 0.12 % MT SOLN
OROMUCOSAL | Status: AC
  Administered 2024-02-02: 15 mL via OROMUCOSAL
  Filled 2024-02-02: qty 15

## 2024-02-02 MED ORDER — ONDANSETRON HCL 4 MG/2ML IJ SOLN
INTRAMUSCULAR | Status: AC
  Filled 2024-02-02: qty 4

## 2024-02-02 MED ORDER — LIDOCAINE 2% (20 MG/ML) 5 ML SYRINGE
INTRAMUSCULAR | Status: AC
  Filled 2024-02-02: qty 10

## 2024-02-02 MED ORDER — OXYCODONE HCL 5 MG/5ML PO SOLN: 5.0000 mg | Freq: Once | ORAL | Status: DC | PRN

## 2024-02-02 MED ORDER — FENTANYL CITRATE (PF) 100 MCG/2ML IJ SOLN
INTRAMUSCULAR | Status: AC
  Filled 2024-02-02: qty 2

## 2024-02-02 MED ORDER — METRONIDAZOLE 500 MG/100ML IV SOLN
INTRAVENOUS | Status: DC | PRN
  Administered 2024-02-02: 500 mg via INTRAVENOUS

## 2024-02-02 NOTE — H&P (Addendum)
 " History and Physical  AMED DATTA FMW:[id number] DOB: [birth year] DOA: 02/01/2024  Referring physician: Logan Martinis, PA-EDP  PCP: [name], MD  Outpatient Specialists: Orthopedic surgery. Patient coming from: Home.  Chief Complaint: Abdominal pain, nausea, and vomiting.  HPI: [name] is a 64 y.o. 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  Endorses severe intermittent abdominal pain, associated with nausea and vomiting.  Admits to flatulence and watery stools.  States his stools have always been watery since after his surgery as an infant.  No subjective fevers or chills.  He presents to the ER for further evaluation.  In the ER, had + flatulence and a bowel movement, watery stools, which he states is his norm.  EDP consulted and discussed the case with general surgery, Dr. Ann.  Recommended admission by the medicine team.  Admitted by [facility], hospitalist service.  Due to concern for internal hernia or volvulus, general surgery is planning to take to the OR this morning.  IV vitamin K  10 mg x 1 and Kcentra  ordered by general surgery.  ED Course: Temperature 98.  BP 110/70, pulse 72, respiratory rate, O2 saturation 93% on room air.  Review of Systems: Review of systems as noted in the HPI. All other systems reviewed and are negative.   Past Medical History:  Diagnosis Date   Anxiety    Depression    DVT (deep venous thrombosis) (HCC)    right clavicle and LLE   Factor V Leiden    Fractures    Frontal lobe deficit    Hirschsprung's disease (HCC)    Hypertension    Impingement syndrome of left ankle    MRSA infection    left hip    OA  (osteoarthritis)    left ankle   Sleep apnea    wears CPAP   Past Surgical History:  Procedure Laterality Date   ANKLE ARTHROSCOPY Left 07/14/2016   Procedure: LEFT ANKLE ARTHROSCOPY AND DEBRIDEMENT;  Surgeon: Harden Jerona GAILS, MD;  Location: [facility] OR;  Service: Orthopedics;  Laterality: Left;   ANKLE FUSION Left 04/20/2017   Procedure: LEFT TIBIOCALCANEAL FUSION;  Surgeon: Harden Jerona GAILS, MD;  Location: [facility] OR;  Service: Orthopedics;  Laterality: Left;   COLON SURGERY     Mega colon   COLONOSCOPY     HIP SURGERY Left    At [facility]   I & D EXTREMITY Right 03/17/2018   Procedure: IRRIGATION AND DEBRIDEMENT RIGHT KNEE;  Surgeon: Harden Jerona GAILS, MD;  Location: [facility] OR;  Service: Orthopedics;  Laterality: Right;   KNEE ARTHROPLASTY Bilateral    2009 by chris blackman   KNEE ARTHROSCOPY     LEFT ANKLE SCOPE WITH DEBRIDMENT  Left 07/14/2016   REVISION AMPUTATION, BELOW THE KNEE Left 11/18/2023   Procedure: REVISION BELOW THE KNEE AMPUTATION;  Surgeon: Harden Jerona GAILS, MD;  Location: [facility] OR;  Service: Orthopedics;  Laterality: Left;    Social History:  reports that he has quit smoking. His smoking use included cigarettes. He has a 10 pack-year smoking history. He has never used smokeless tobacco. He reports that he does not currently use alcohol. He reports current drug use. Drug: Marijuana.   Allergies[1]  Family  History  Problem Relation Age of Onset   Heart attack Father       Prior to Admission medications  Medication Sig Start Date End Date Taking? Authorizing Provider  atorvastatin  (LIPITOR) 20 MG tablet Take 20 mg by mouth daily.  02/17/16   [provider]  busPIRone  (BUSPAR ) 15 MG tablet Take 15 mg by mouth 3 (three) times daily.    [provider]  cariprazine  (VRAYLAR ) 3 MG capsule Take 3 mg by mouth in the morning and at bedtime.    [provider]  enoxaparin  (LOVENOX ) 150 MG/ML injection Inject 0.94 mLs (140 mg total) into the skin 2 (two) times daily for  5 days. Follow up INR 11/25/2023 11/22/23 11/27/23  Harden Jerona GAILS, MD  FLUoxetine  (PROZAC ) 40 MG capsule Take 40 mg by mouth daily. 02/16/16   [provider]  gabapentin  (NEURONTIN ) 100 MG tablet Take 100 mg by mouth 6 (six) times daily. 02/17/16   [provider]  oxyCODONE -acetaminophen  (PERCOCET/ROXICET) 5-325 MG tablet Take 1 tablet by mouth every 4 (four) hours as needed. 11/22/23   Harden Jerona GAILS, MD  rOPINIRole  (REQUIP ) 0.5 MG tablet Take 0.5 mg by mouth daily.    [provider]  tamsulosin  (FLOMAX ) 0.4 MG CAPS capsule Take 0.4 mg by mouth at bedtime.    [provider]  traZODone  (DESYREL ) 100 MG tablet Take 100 mg by mouth 2 (two) times daily.    [provider]  warfarin (COUMADIN ) 5 MG tablet Take 5 mg by mouth every evening.    [provider]    Physical Exam: BP 110/70   Pulse 70   Temp 98 F (36.7 C)   Resp 16   Ht 5' 11 (1.803 m)   Wt 129.7 kg   SpO2 98%   BMI 39.89 kg/m   General: 64 y.o. year-old male well developed well nourished in no acute distress.  Alert and oriented x3. Cardiovascular: Regular rate and rhythm with no rubs or gallops.  No thyromegaly or JVD noted.  Trace lower extremity edema in right lower extremity.  Left below-knee amputation. Respiratory: Clear to auscultation with no wheezes or rales. Good inspiratory effort. Abdomen: Soft, obese, with hypoactive bowel sounds present.  Muskuloskeletal: No cyanosis or clubbing noted bilaterally Neuro: CN II-XII intact, strength, sensation, reflexes Skin: No ulcerative lesions noted or rashes Psychiatry: Judgement and insight appear normal. Mood is appropriate for condition and setting          Labs on Admission:  Basic Metabolic Panel: Recent Labs  Lab 02/01/24 1345 02/01/24 1353  NA 135 133*  K 3.6 4.0  CL 98 97*  CO2  --  26  GLUCOSE 97 94  BUN 15 15  CREATININE 0.80 0.74  CALCIUM   --  9.2   Liver Function Tests: Recent Labs  Lab  02/01/24 1353  AST 114*  ALT 48*  ALKPHOS 73  BILITOT 0.3  PROT 7.0  ALBUMIN  3.5   Recent Labs  Lab 02/01/24 1353  LIPASE 15   No results for input(s): AMMONIA in the last 168 hours. CBC: Recent Labs  Lab 02/01/24 1345 02/01/24 1353 02/02/24 0437  WBC  --  13.2* 8.7  NEUTROABS  --  10.5*  --   HGB 15.3 14.5 14.3  HCT 45.0 43.1 43.8  MCV  --  91.9 93.2  PLT  --  213 213   Cardiac Enzymes: No results for input(s): CKTOTAL, CKMB, CKMBINDEX, TROPONINI in the last 168 hours.  BNP (  last 3 results) No results for input(s): BNP in the last 8760 hours.  ProBNP (last 3 results) No results for input(s): PROBNP in the last 8760 hours.  CBG: No results for input(s): GLUCAP in the last 168 hours.  Radiological Exams on Admission: DG Abd Portable 1V Result Date: 02/02/2024 CLINICAL DATA:  NG tube placement. EXAM: PORTABLE ABDOMEN - 1 VIEW COMPARISON:  01/26/2024 FINDINGS: NG tube tip is in the fundus of the stomach with proximal side port below the GE junction. Gaseous distention of bowel in the upper abdomen again noted. IMPRESSION: NG tube tip is in the fundus of the stomach. Electronically Signed   By: Camellia Candle M.D.   On: 02/02/2024 05:33   CT ABDOMEN PELVIS W CONTRAST Result Date: 02/01/2024 EXAM: CT ABDOMEN AND PELVIS WITH CONTRAST 02/01/2024 02:20:58 PM TECHNIQUE: CT of the abdomen and pelvis was performed with the administration of 100 mL of iohexol  (OMNIPAQUE ) 350 MG/ML injection. Multiplanar reformatted images are provided for review. Automated exposure control, iterative reconstruction, and/or weight-based adjustment of the mA/kV was utilized to reduce the radiation dose to as low as reasonably achievable. COMPARISON: 01/23/2024 CLINICAL HISTORY: Bowel obstruction suspected FINDINGS: LOWER CHEST: Multifocal subsegmental atelectasis in the lung bases. Posterior bibasilar dependent atelectasis also noted. LIVER: The liver is unremarkable. GALLBLADDER AND BILE  DUCTS: Gallbladder is unremarkable. No biliary ductal dilatation. SPLEEN: No acute abnormality. PANCREAS: Mild fatty atrophy of the pancreatic head and downstream body. ADRENAL GLANDS: No acute abnormality. KIDNEYS, URETERS AND BLADDER: Small nonobstructive calyceal calculi present in both kidneys. Prominent urothelial enhancement in the right renal pelvis. No hydronephrosis in either kidney. No perinephric or periureteral stranding. Circumferential wall thickening of the urinary bladder, which is not distended. Normal variant retroaortic left renal vein. GI AND BOWEL: Moderate air fluid level within the distended stomach. While there is a normal appearing duodenal C-sweep, the proximal jejunum is decompressed and courses across the abdomen towards the right upper quadrant. The majority of the small bowel is clustered in the right abdomen, dilated and fluid filled, measuring up to 6 cm. A couple of clustered segments of small bowel in the right abdomen are decompressed (axial 39) with apparent twisting on the coronal images. There is also reversal of the superior mesenteric artery (SMA) to superior mesenteric vein (SMV) relationship (axial 39) suggesting twisting. The cecum and ileocecal valve and normal appendix are present in the left lower quadrant. The remainder of the colon is not dilated or well appreciated. Moderate air fluid level present in the rectosigmoid colon. No pneumatosis or portal venous gas. PERITONEUM AND RETROPERITONEUM: Small amount of layering ascites in the left paracolic gutter. No free air. No pneumoperitoneum. VASCULATURE: Aorta is normal in caliber. Minimal bilateral iliac atherosclerosis. Reversal of the SMA to SMV relationship (axial 39) suggesting twisting. LYMPH NODES: No lymphadenopathy. REPRODUCTIVE ORGANS: Mild prostatomegaly. Hypodense nodule filling the right prostate gland measuring 3.8 cm. No free pelvic fluid. BONES AND SOFT TISSUES: Osteopenia. The left hip arthroplasty is  anatomically aligned without dislocation. Multilevel degenerative disc disease of the spine. Small fat containing paraumbilical right ventral hernia. Moderate sized fat containing right inguinal hernia with a small left inguinal hernia, without changes of the vascular compromised. No acute osseous abnormality. IMPRESSION: 1. Abnormal, fluid-filled dilation of the stomach and majority of the small bowel, which is clustered in the right abdomen cecum and appendix are noted in the left lower quadrant. While a definite transition point is difficult to appreciate, there is apparent twisting of a couple of  segments of small bowel in the right abdomen with reversal of the SMA/SMV relationship, suggestive of a small bowel obstruction either from internal hernia or volvulus. No pneumatosis, portal venous gas, or pneumoperitoneum at this time. Fluoroscopic upper GI series with small bowel follow-through may be of benefit for further characterization. 2. Small amount of layering ascites in the left paracolic gutter, likely reactive. 3. Circumferential wall thickening of the urinary bladder with mild urothelial enhancement of the right renal pelvis, suggesting an ascending urinary tract infection. Correlation with urinalysis recommended. 4. Nonobstructive nephrolithiasis bilaterally. No hydronephrosis. Electronically signed by: Rogelia Myers MD MD 02/01/2024 03:07 PM EST RP Workstation: [id number]    EKG: I independently viewed the EKG done and my findings are as followed: None available at the time of this visit.  Assessment/Plan Present on Admission:  SBO (small bowel obstruction) (HCC)  Principal Problem:   SBO (small bowel obstruction) (HCC)  SBO with concern for internal hernia or volvulus, POA History of Hirschsprung disease with abdominal surgery as an infant Initially diagnosed with SBO at [facility] more than 10 days ago, was managed conservatively at [facility]. Plan for surgical intervention  today 02/02/2024 by Dr. Ann NG tube was placed in the ER. IV fluid hydration LR at 100 cc/h x 1 day Optimize magnesium  and potassium levels Pain management IV antiemetics as needed Rest of management per general surgery  Factor V [place] History of left lower extremity DVT, right clavicle DVT, on Coumadin  Supratherapeutic INR INR 4 IV Vitamin K  10 mg x 1 and Kcentra  ordered by general surgery Pharmacy consulted to manage Coumadin  post surgical intervention.  History of MRSA infection Follow MRSA screening test  Hyperlipidemia N.p.o. Hold off oral medications.  Chronic anxiety/depression N.p.o. Hold off oral medications.  Obesity BMI 39 Recommend weight loss outpatient with regular physical activity and healthy dieting.  OSA CPAP nightly    Time: 75 minutes.    DVT prophylaxis: SCDs.  Code Status: Full code.  Family Communication: None at bedside.  Disposition Plan: Admitted to telemetry unit.  Consults called: General Surgery consulted by EDP.  Admission status: Inpatient status.   Status is: Inpatient The patient requires at least 2 midnights for further evaluation and treatment of present condition.   Terry LOISE Hurst MD [facility] Pager [phone]  If 7PM-7AM, please contact night-coverage [email or web address] Password [email or web address]  02/02/2024, 5:55 AM      [1] No Known Allergies  "

## 2024-02-02 NOTE — ED Notes (Signed)
 NG tube in place and connected to intermittent suction @130  mmHg. Output of 120 mL.

## 2024-02-02 NOTE — ED Provider Notes (Signed)
 " [facility] Provider Note   Woods: [id number] Arrival date & time: 02/01/24  1251     Patient presents with: Abdominal Pain, Emesis, and Nausea   [name] is a 64 y.o. male.  Patient with past medical history significant for Hirschsprung's disease, admission last week to an outside hospital due to bowel obstruction, DVT, hypertension, left BKA presents to the emergency department complaining of continued abdominal pain with nausea and vomiting.  He states he was in the hospital in [place] from last Monday through Friday.  He states he has not eaten since that time due to nausea and vomiting.  He endorses being unable to keep fluids down.  He states he has still had some small amounts of bowel movement described as liquid in nature which is normal for the patient.  He is here this evening complaining of continued abdominal pain with nausea and vomiting.  He states he was treated with an NG tube and bowel rest at the previous hospital with no surgical options discussed.  He denies chest pain, fever, shortness of breath, urinary symptoms.    Abdominal Pain Associated symptoms: vomiting   Emesis Associated symptoms: abdominal pain        Prior to Admission medications  Medication Sig Start Date End Date Taking? Authorizing Provider  atorvastatin  (LIPITOR) 20 MG tablet Take 20 mg by mouth daily.  02/17/16   [provider]  busPIRone  (BUSPAR ) 15 MG tablet Take 15 mg by mouth 3 (three) times daily.    [provider]  cariprazine  (VRAYLAR ) 3 MG capsule Take 3 mg by mouth in the morning and at bedtime.    [provider]  enoxaparin  (LOVENOX ) 150 MG/ML injection Inject 0.94 mLs (140 mg total) into the skin 2 (two) times daily for 5 days. Follow up INR 11/25/2023 11/22/23 11/27/23  Harden Jerona GAILS, MD  FLUoxetine  (PROZAC ) 40 MG capsule Take 40 mg by mouth daily. 02/16/16   [provider]  gabapentin  (NEURONTIN ) 100  MG tablet Take 100 mg by mouth 6 (six) times daily. 02/17/16   [provider]  oxyCODONE -acetaminophen  (PERCOCET/ROXICET) 5-325 MG tablet Take 1 tablet by mouth every 4 (four) hours as needed. 11/22/23   Harden Jerona GAILS, MD  rOPINIRole  (REQUIP ) 0.5 MG tablet Take 0.5 mg by mouth daily.    [provider]  tamsulosin  (FLOMAX ) 0.4 MG CAPS capsule Take 0.4 mg by mouth at bedtime.    [provider]  traZODone  (DESYREL ) 100 MG tablet Take 100 mg by mouth 2 (two) times daily.    [provider]  warfarin (COUMADIN ) 5 MG tablet Take 5 mg by mouth every evening.    [provider]    Allergies: Patient has no known allergies.    Review of Systems  Gastrointestinal:  Positive for abdominal pain and vomiting.    Updated Vital Signs BP 110/70   Pulse 70   Temp 98 F (36.7 C)   Resp 16   Ht 5' 11 (1.803 m)   Wt 129.7 kg   SpO2 98%   BMI 39.89 kg/m   Physical Exam Vitals and nursing note reviewed.  Constitutional:      General: He is not in acute distress.    Appearance: He is well-developed.  HENT:     Head: Normocephalic and atraumatic.  Eyes:     Conjunctiva/sclera: Conjunctivae normal.  Cardiovascular:     Rate and Rhythm: Normal rate and regular rhythm.  Heart sounds: No murmur heard. Pulmonary:     Effort: Pulmonary effort is normal. No respiratory distress.     Breath sounds: Normal breath sounds.  Abdominal:     General: A surgical scar is present.     Palpations: Abdomen is soft.     Tenderness: There is generalized abdominal tenderness.     Comments: Scar noted from previous abdominal surgery below umbilicus.   Musculoskeletal:        General: No swelling.     Cervical back: Neck supple.     Comments: Left sided BKA  Skin:    General: Skin is warm and dry.     Capillary Refill: Capillary refill takes less than 2 seconds.  Neurological:     Mental Status: He is alert.  Psychiatric:        Mood and Affect: Mood  normal.     (all labs ordered are listed, but only abnormal results are displayed) Labs Reviewed  CBC WITH DIFFERENTIAL/PLATELET - Abnormal; Notable for the following components:      Result Value   WBC 13.2 (*)    Neutro Abs 10.5 (*)    All other components within normal limits  COMPREHENSIVE METABOLIC PANEL WITH GFR - Abnormal; Notable for the following components:   Sodium 133 (*)    Chloride 97 (*)    AST 114 (*)    ALT 48 (*)    All other components within normal limits  I-STAT CHEM 8, ED - Abnormal; Notable for the following components:   Calcium , Ion 1.13 (*)    All other components within normal limits  LIPASE, BLOOD  CBC  PROTIME-INR  COMPREHENSIVE METABOLIC PANEL WITH GFR  I-STAT CG4 LACTIC ACID, ED  TYPE AND SCREEN    EKG: None  Radiology: CT ABDOMEN PELVIS W CONTRAST Result Date: 02/01/2024 EXAM: CT ABDOMEN AND PELVIS WITH CONTRAST 02/01/2024 02:20:58 PM TECHNIQUE: CT of the abdomen and pelvis was performed with the administration of 100 mL of iohexol  (OMNIPAQUE ) 350 MG/ML injection. Multiplanar reformatted images are provided for review. Automated exposure control, iterative reconstruction, and/or weight-based adjustment of the mA/kV was utilized to reduce the radiation dose to as low as reasonably achievable. COMPARISON: 01/23/2024 CLINICAL HISTORY: Bowel obstruction suspected FINDINGS: LOWER CHEST: Multifocal subsegmental atelectasis in the lung bases. Posterior bibasilar dependent atelectasis also noted. LIVER: The liver is unremarkable. GALLBLADDER AND BILE DUCTS: Gallbladder is unremarkable. No biliary ductal dilatation. SPLEEN: No acute abnormality. PANCREAS: Mild fatty atrophy of the pancreatic head and downstream body. ADRENAL GLANDS: No acute abnormality. KIDNEYS, URETERS AND BLADDER: Small nonobstructive calyceal calculi present in both kidneys. Prominent urothelial enhancement in the right renal pelvis. No hydronephrosis in either kidney. No perinephric or  periureteral stranding. Circumferential wall thickening of the urinary bladder, which is not distended. Normal variant retroaortic left renal vein. GI AND BOWEL: Moderate air fluid level within the distended stomach. While there is a normal appearing duodenal C-sweep, the proximal jejunum is decompressed and courses across the abdomen towards the right upper quadrant. The majority of the small bowel is clustered in the right abdomen, dilated and fluid filled, measuring up to 6 cm. A couple of clustered segments of small bowel in the right abdomen are decompressed (axial 39) with apparent twisting on the coronal images. There is also reversal of the superior mesenteric artery (SMA) to superior mesenteric vein (SMV) relationship (axial 39) suggesting twisting. The cecum and ileocecal valve and normal appendix are present in the left lower quadrant. The remainder of  the colon is not dilated or well appreciated. Moderate air fluid level present in the rectosigmoid colon. No pneumatosis or portal venous gas. PERITONEUM AND RETROPERITONEUM: Small amount of layering ascites in the left paracolic gutter. No free air. No pneumoperitoneum. VASCULATURE: Aorta is normal in caliber. Minimal bilateral iliac atherosclerosis. Reversal of the SMA to SMV relationship (axial 39) suggesting twisting. LYMPH NODES: No lymphadenopathy. REPRODUCTIVE ORGANS: Mild prostatomegaly. Hypodense nodule filling the right prostate gland measuring 3.8 cm. No free pelvic fluid. BONES AND SOFT TISSUES: Osteopenia. The left hip arthroplasty is anatomically aligned without dislocation. Multilevel degenerative disc disease of the spine. Small fat containing paraumbilical right ventral hernia. Moderate sized fat containing right inguinal hernia with a small left inguinal hernia, without changes of the vascular compromised. No acute osseous abnormality. IMPRESSION: 1. Abnormal, fluid-filled dilation of the stomach and majority of the small bowel, which is  clustered in the right abdomen cecum and appendix are noted in the left lower quadrant. While a definite transition point is difficult to appreciate, there is apparent twisting of a couple of segments of small bowel in the right abdomen with reversal of the SMA/SMV relationship, suggestive of a small bowel obstruction either from internal hernia or volvulus. No pneumatosis, portal venous gas, or pneumoperitoneum at this time. Fluoroscopic upper GI series with small bowel follow-through may be of benefit for further characterization. 2. Small amount of layering ascites in the left paracolic gutter, likely reactive. 3. Circumferential wall thickening of the urinary bladder with mild urothelial enhancement of the right renal pelvis, suggesting an ascending urinary tract infection. Correlation with urinalysis recommended. 4. Nonobstructive nephrolithiasis bilaterally. No hydronephrosis. Electronically signed by: Rogelia Myers MD MD 02/01/2024 03:07 PM EST RP Workstation: [id number]     .Critical Care  Performed by: Logan Ubaldo NOVAK, PA-C Authorized by: Logan Ubaldo NOVAK, PA-C   Critical care provider statement:    Critical care time (minutes):  30   Critical care time was exclusive of:  Separately billable procedures and treating other patients   Critical care was time spent personally by me on the following activities:  Development of treatment plan with patient or surrogate, discussions with consultants, evaluation of patient's response to treatment, examination of patient, ordering and review of laboratory studies, ordering and review of radiographic studies, ordering and performing treatments and interventions, pulse oximetry, re-evaluation of patient's condition and review of old charts   Care discussed with: admitting provider      Medications Ordered in the ED  iohexol  (OMNIPAQUE ) 350 MG/ML injection 100 mL (100 mLs Intravenous Contrast Given 02/01/24 1414)                                     Medical Decision Making  This patient presents to the ED for concern of abdominal pain, this involves an extensive number of treatment options, and is a complaint that carries with it a high risk of complications and morbidity.  The differential diagnosis includes bowel obstruction appendicitis, colitis, cholecystitis, others   Co morbidities / Chronic conditions that complicate the patient evaluation  History of bowel obstruction, Hirschsprung's disease, previous abdominal surgery   Additional history obtained:  Additional history obtained from EMR   Lab Tests:  I Ordered, and personally interpreted labs.  The pertinent results include: Leukocytosis with a white count 13,200   Imaging Studies ordered:  I ordered imaging studies including CT abdomen pelvis with contrast I independently visualized  and interpreted imaging which showed  1. Abnormal, fluid-filled dilation of the stomach and majority of the small  bowel, which is clustered in the right abdomen cecum and appendix are noted in  the left lower quadrant. While a definite transition point is difficult to  appreciate, there is apparent twisting of a couple of segments of small bowel  in the right abdomen with reversal of the SMA/SMV relationship, suggestive of a  small bowel obstruction either from internal hernia or volvulus. No  pneumatosis, portal venous gas, or pneumoperitoneum at this time. Fluoroscopic  upper GI series with small bowel follow-through may be of benefit for further  characterization.  2. Small amount of layering ascites in the left paracolic gutter, likely  reactive.  3. Circumferential wall thickening of the urinary bladder with mild urothelial  enhancement of the right renal pelvis, suggesting an ascending urinary tract  infection. Correlation with urinalysis recommended.  4. Nonobstructive nephrolithiasis bilaterally. No hydronephrosis.   I agree with the radiologist interpretation   Problem  List / ED Course / Critical interventions / Medication management   NG tube was ordered  Consultations Obtained:  I requested consultation with the surgeon, Dr.Burton,  and discussed lab and imaging findings as well as pertinent plan - they recommend: plan to see at bedside for assessment.  Recommends NG tube, hospitalist admission.  Plans on surgical intervention later this morning  I requested consultation with the hospital service. Dr.Hall agrees to see the patient for admission.    Test / Admission - Considered:  Patient will need admission for medical management with plans for surgical intervention due to SBO with possible rotation due to internal hernia or volvulus.      Final diagnoses:  SBO (small bowel obstruction) [facility])    ED Discharge Orders     None          Logan Ubaldo KATHEE DEVONNA 02/02/24 [id number]    Darra Fonda MATSU, MD 02/02/24 [phone]  "

## 2024-02-02 NOTE — Plan of Care (Signed)
" °  Problem: Education: Goal: Knowledge of the prescribed therapeutic regimen will improve Outcome: Progressing   Problem: Bowel/Gastric: Goal: Gastrointestinal status for postoperative course will improve Outcome: Progressing   Problem: Cardiac: Goal: Ability to maintain an adequate cardiac output Outcome: Progressing   "

## 2024-02-02 NOTE — Progress Notes (Signed)
 "        [facility]                                                                               [name], is a 64 y.o. male, DOB - 1960-01-29, FMW:[id number] Admit date - 02/01/2024    Outpatient Primary MD for the patient is [name], MD  LOS - 0  days    Brief summary   [name] is a 64 y.o. 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  Endorses severe intermittent abdominal pain, associated with nausea and vomiting, and watery stools.  Due to concern for internal hernia or volvulus, general surgery is planning to take to the OR   Assessment & Plan    Assessment and Plan: SBO Gen surgery on board and plan for OR later today.  Pain control.    Left lower extremity DVT Anticoagulation on hold today.    Estimated body mass index is 39.89 kg/m (pended) as calculated from the following:   Height as of this encounter: (P) 5' 11 (1.803 m).   Weight as of this encounter: (P) 129.7 kg.  Code Status: full code.  DVT Prophylaxis:  SCD's Start: 02/02/24 0626   Level of Care: Level of care: Telemetry Family Communication:   Disposition Plan:     Remains inpatient appropriate:  pending clinical improvement.   Procedures:  OR today.   Consultants:   Gen surgery.   Antimicrobials:   Anti-infectives (From admission, onward)    Start     Dose/Rate Route Frequency Ordered Stop   02/02/24 0630  ceFAZolin  (ANCEF ) IVPB 3g/150 mL premix        3 g 300 mL/hr over 30 Minutes Intravenous On call to O.R. 02/02/24 9374 02/03/24 0559        Medications  Scheduled Meds:   ceFAZolin  (ANCEF ) IV  3 g Intravenous On Call to OR   Continuous  Infusions:  lactated ringers  100 mL/hr at 02/02/24 0654   lactated ringers  10 mL/hr at 02/02/24 0854   PRN Meds:.    Subjective:   [name] was seen and examined today.  Nausea   Objective:   Vitals:   02/02/24 0042 02/02/24 0414 02/02/24 0725 02/02/24 0756  BP: 118/84 110/70  (!) (P) 140/80  Pulse: 74 70    Resp: 16 16  (P) 18  Temp: 97.6 F (36.4 C) 98 F (36.7 C)  (P) 98.2 F (36.8 C)  TempSrc:    (P) Oral  SpO2: 96% 98% 98% (P) 95%  Weight:    (P) 129.7 kg  Height:    (P) 5' 11 (1.803 m)   No intake or output data in the 24 hours ending 02/02/24 0925 Filed Weights   02/01/24 1331 02/02/24 0756  Weight: 129.7 kg (P) 129.7 kg      Data Reviewed:  I have personally reviewed following  labs and imaging studies   CBC Lab Results  Component Value Date   WBC 8.7 02/02/2024   RBC 4.70 02/02/2024   HGB 14.3 02/02/2024   HCT 43.8 02/02/2024   MCV 93.2 02/02/2024   MCH 30.4 02/02/2024   PLT 213 02/02/2024   MCHC 32.6 02/02/2024   RDW 13.1 02/02/2024   LYMPHSABS 1.6 02/01/2024   MONOABS 0.9 02/01/2024   EOSABS 0.0 02/01/2024   BASOSABS 0.0 02/01/2024     Last metabolic panel Lab Results  Component Value Date   NA 137 02/02/2024   K 3.5 02/02/2024   CL 99 02/02/2024   CO2 27 02/02/2024   BUN 13 02/02/2024   CREATININE 0.71 02/02/2024   GLUCOSE 95 02/02/2024   GFRNONAA >60 02/02/2024   GFRAA >60 03/21/2018   CALCIUM  9.3 02/02/2024   PROT 7.0 02/02/2024   ALBUMIN  3.7 02/02/2024   BILITOT 0.5 02/02/2024   ALKPHOS 74 02/02/2024   AST 108 (H) 02/02/2024   ALT 50 (H) 02/02/2024   ANIONGAP 11 02/02/2024    CBG (last 3)  No results for input(s): GLUCAP in the last 72 hours.    Coagulation Profile: Recent Labs  Lab 02/02/24 0437 02/02/24 0650  INR 4.0* 1.2     Radiology Studies: DG Abd Portable 1V Result Date: 02/02/2024 CLINICAL DATA:  NG tube placement. EXAM: PORTABLE ABDOMEN - 1 VIEW COMPARISON:  01/26/2024 FINDINGS: NG tube tip is  in the fundus of the stomach with proximal side port below the GE junction. Gaseous distention of bowel in the upper abdomen again noted. IMPRESSION: NG tube tip is in the fundus of the stomach. Electronically Signed   By: Camellia Candle M.D.   On: 02/02/2024 05:33   CT ABDOMEN PELVIS W CONTRAST Result Date: 02/01/2024 EXAM: CT ABDOMEN AND PELVIS WITH CONTRAST 02/01/2024 02:20:58 PM TECHNIQUE: CT of the abdomen and pelvis was performed with the administration of 100 mL of iohexol  (OMNIPAQUE ) 350 MG/ML injection. Multiplanar reformatted images are provided for review. Automated exposure control, iterative reconstruction, and/or weight-based adjustment of the mA/kV was utilized to reduce the radiation dose to as low as reasonably achievable. COMPARISON: 01/23/2024 CLINICAL HISTORY: Bowel obstruction suspected FINDINGS: LOWER CHEST: Multifocal subsegmental atelectasis in the lung bases. Posterior bibasilar dependent atelectasis also noted. LIVER: The liver is unremarkable. GALLBLADDER AND BILE DUCTS: Gallbladder is unremarkable. No biliary ductal dilatation. SPLEEN: No acute abnormality. PANCREAS: Mild fatty atrophy of the pancreatic head and downstream body. ADRENAL GLANDS: No acute abnormality. KIDNEYS, URETERS AND BLADDER: Small nonobstructive calyceal calculi present in both kidneys. Prominent urothelial enhancement in the right renal pelvis. No hydronephrosis in either kidney. No perinephric or periureteral stranding. Circumferential wall thickening of the urinary bladder, which is not distended. Normal variant retroaortic left renal vein. GI AND BOWEL: Moderate air fluid level within the distended stomach. While there is a normal appearing duodenal C-sweep, the proximal jejunum is decompressed and courses across the abdomen towards the right upper quadrant. The majority of the small bowel is clustered in the right abdomen, dilated and fluid filled, measuring up to 6 cm. A couple of clustered segments of small  bowel in the right abdomen are decompressed (axial 39) with apparent twisting on the coronal images. There is also reversal of the superior mesenteric artery (SMA) to superior mesenteric vein (SMV) relationship (axial 39) suggesting twisting. The cecum and ileocecal valve and normal appendix are present in the left lower quadrant. The remainder of the colon is not dilated or well appreciated. Moderate air  fluid level present in the rectosigmoid colon. No pneumatosis or portal venous gas. PERITONEUM AND RETROPERITONEUM: Small amount of layering ascites in the left paracolic gutter. No free air. No pneumoperitoneum. VASCULATURE: Aorta is normal in caliber. Minimal bilateral iliac atherosclerosis. Reversal of the SMA to SMV relationship (axial 39) suggesting twisting. LYMPH NODES: No lymphadenopathy. REPRODUCTIVE ORGANS: Mild prostatomegaly. Hypodense nodule filling the right prostate gland measuring 3.8 cm. No free pelvic fluid. BONES AND SOFT TISSUES: Osteopenia. The left hip arthroplasty is anatomically aligned without dislocation. Multilevel degenerative disc disease of the spine. Small fat containing paraumbilical right ventral hernia. Moderate sized fat containing right inguinal hernia with a small left inguinal hernia, without changes of the vascular compromised. No acute osseous abnormality. IMPRESSION: 1. Abnormal, fluid-filled dilation of the stomach and majority of the small bowel, which is clustered in the right abdomen cecum and appendix are noted in the left lower quadrant. While a definite transition point is difficult to appreciate, there is apparent twisting of a couple of segments of small bowel in the right abdomen with reversal of the SMA/SMV relationship, suggestive of a small bowel obstruction either from internal hernia or volvulus. No pneumatosis, portal venous gas, or pneumoperitoneum at this time. Fluoroscopic upper GI series with small bowel follow-through may be of benefit for further  characterization. 2. Small amount of layering ascites in the left paracolic gutter, likely reactive. 3. Circumferential wall thickening of the urinary bladder with mild urothelial enhancement of the right renal pelvis, suggesting an ascending urinary tract infection. Correlation with urinalysis recommended. 4. Nonobstructive nephrolithiasis bilaterally. No hydronephrosis. Electronically signed by: Rogelia Myers MD MD 02/01/2024 03:07 PM EST RP Workstation: [id number]       Elgie Butter M.D. [facility] 02/02/2024, 9:25 AM  Available via Epic secure chat 7am-7pm After 7 pm, please refer to night coverage provider listed on amion.    "

## 2024-02-02 NOTE — Op Note (Addendum)
 02/02/2024  9:12 AM  10:00 AM  PATIENT:  [name]  64 y.o. male  PRE-OPERATIVE DIAGNOSIS:  internal hernia versus volvulus  POST-OPERATIVE DIAGNOSIS:  internal hernia versus volvulus  PROCEDURE:  Procedures: LAPAROTOMY, EXPLORATORY (N/A)  SURGEON:  Surgeons and Role:    DEWAINE Rubin Calamity, MD - Primary   ASSISTANTS: Dr. Doroteo Curly PGY-3  ANESTHESIA:   local and general  EBL:  20 mL   BLOOD ADMINISTERED:none  DRAINS: none   LOCAL MEDICATIONS USED:  NONE  SPECIMEN:  No Specimen  DISPOSITION OF SPECIMEN:  N/A  COUNTS:  YES  TOURNIQUET:  * No tourniquets in log *  DICTATION: .Dragon Dictation Indication procedure: Patient is a 64 year old male, with a history of SBO.  Patient was recently seen in outside hospital and underwent SBO treatment which he felt was never normal thereafter.  He came back to the ER secondary to continued abdominal pain nausea vomiting.  He underwent CT scan.  This was significant for possible internal hernia versus volvulus.  Patient was taken back to the OR for exploratory laparotomy.  Findings: Patient with significant amount of adhesions to the right lower quadrant area to previous ostomy site.  These were taken down sharply.  It appeared that there was some small bowel that was underneath this area of adhesions.  Small bowel was run from ligament of Treitz distally to the ileocolonic anastomosis in the left upper quadrant area.  There is no significant decompression portion of the small intestine.  There may be some portion of dysmotility of his GI tract.  Details of procedure: After the patient was consented he was taken back to the OR and placed in supine position with bilateral SCDs in place.  He underwent general endotracheal intubation.  He was then prepped and draped sterile fashion.  Timeout was called all facts verified.  At this time a #10 blade was used to make a midline incision.  Dissection was taken down to the linea alba.   This was incised.  This was elevated between 2 Kocher's.  The peritoneum was entered bluntly.  There was some adhesions inferiorly there were omental.  The fascia was then extended to length of skin incision.  At this time the small bowel was eviscerated.  There is dilation of the entire small bowel.  There is some adhesions to the right lower quadrant area.  There is small bowel adhesions to the anterior abdominal wall.  These were taken down sharply.  There appeared to be some tunneling under this area of adhesions.  There was some small bowel that was underneath this area and likely the source of the obstruction.  At this time the entire small bowel was eviscerated.  I was able to find the ligament of Treitz.  The small bowel was then ran distally.  There is a segment of small bowel that was adherent to the mesentery in the mid ileum area.  This was taken down sharply.  The small bowel was then tracked to the ileocolonic anastomosis which was in the left upper quadrant area.  The entire colon was dilated.  There was no significant transition point that could be seen.  There was some decompressed proximal bowel.  At this time I was satisfied with the fact there was no further transition points.  The small bowel was placed back into the abdominal cavity.  The midline fascia was reapproximate using #1 single-stranded PDS x 2 in a standard running fashion.  Skin was stapled.  The OpSite was placed.  Patient tolerated the procedure well was taken to the recovery in stable condition.  I was personally present during the key and critical portions of this procedure and immediately available throughout the entire procedure, as documented in my operative note.   PLAN OF CARE: Admit to inpatient   PATIENT DISPOSITION:  PACU - hemodynamically stable.   Delay start of Pharmacological VTE agent (>24hrs) due to surgical blood loss or risk of bleeding: yes

## 2024-02-02 NOTE — ED Notes (Signed)
 Informed consent signed by pt and primary RN and at bedside

## 2024-02-02 NOTE — Anesthesia Postprocedure Evaluation (Signed)
"   Anesthesia Post Note  Patient: DAINE GUNTHER  Procedure(s) Performed: LAPAROTOMY, EXPLORATORY (Abdomen)     Patient location during evaluation: PACU Anesthesia Type: General Level of consciousness: awake and alert Pain management: pain level controlled Vital Signs Assessment: post-procedure vital signs reviewed and stable Respiratory status: spontaneous breathing, nonlabored ventilation, respiratory function stable and patient connected to nasal cannula oxygen Cardiovascular status: blood pressure returned to baseline and stable Postop Assessment: no apparent nausea or vomiting Anesthetic complications: no   No notable events documented.  Last Vitals:  Vitals:   02/02/24 1100 02/02/24 1115  BP: 134/67 129/72  Pulse: 71 70  Resp: 20 18  Temp:    SpO2: 93% 95%    Last Pain:  Vitals:   02/02/24 1115  TempSrc:   PainSc: Asleep                 Thom JONELLE Peoples      "

## 2024-02-02 NOTE — Transfer of Care (Signed)
 Immediate Anesthesia Transfer of Care Note  Patient: [name]  Procedure(s) Performed: LAPAROTOMY, EXPLORATORY (Abdomen)  Patient Location: PACU  Anesthesia Type:General  Level of Consciousness: awake  Airway & Oxygen Therapy: Patient Spontanous Breathing and Patient connected to nasal cannula oxygen  Post-op Assessment: Report given to RN and Post -op Vital signs reviewed and stable  Post vital signs: Reviewed and stable  Last Vitals:  Vitals Value Taken Time  BP 120/69 02/02/24 10:18  Temp    Pulse 70 02/02/24 10:23  Resp 24 02/02/24 10:23  SpO2 94 % 02/02/24 10:23  Vitals shown include unfiled device data.  Last Pain:  Vitals:   02/02/24 0756  TempSrc: (P) Oral  PainSc: 0-No pain         Complications: No notable events documented.

## 2024-02-02 NOTE — Anesthesia Preprocedure Evaluation (Addendum)
"                                    Anesthesia Evaluation  Patient identified by MRN, date of birth, ID band Patient awake    Reviewed: Allergy & Precautions, NPO status , Patient's Chart, lab work & pertinent test results  History of Anesthesia Complications Negative for: history of anesthetic complications  Airway Mallampati: III  TM Distance: >3 FB Neck ROM: Full   Comment: Previous grade I view with Glidescope 4, easy mask Dental  (+) Dental Advisory Given   Pulmonary neg shortness of breath, sleep apnea (does not use CPAP) , neg COPD, neg recent URI, Patient abstained from smoking., former smoker, PE   Pulmonary exam normal breath sounds clear to auscultation       Cardiovascular hypertension, (-) angina + DVT  (-) Past MI, (-) Cardiac Stents and (-) CABG (-) dysrhythmias  Rhythm:Regular Rate:Normal     Neuro/Psych neg Seizures PSYCHIATRIC DISORDERS Anxiety Depression    Frontal lobe deficit    GI/Hepatic Neg liver ROS,neg GERD  ,,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 S/p Kcentra     Anesthesia Other Findings   Reproductive/Obstetrics                              Anesthesia Physical Anesthesia Plan  ASA: 4  Anesthesia Plan: General   Post-op Pain Management: Tylenol  PO (pre-op)*   Induction: Intravenous and Rapid sequence  PONV Risk Score and Plan: 2 and Ondansetron , Dexamethasone  and Treatment may vary due to age or medical condition  Airway Management Planned: Oral ETT and Video Laryngoscope Planned  Additional Equipment:   Intra-op Plan:   Post-operative Plan: Possible Post-op intubation/ventilation  Informed Consent: I have  reviewed the patients History and Physical, chart, labs and discussed the procedure including the risks, benefits and alternatives for the proposed anesthesia with the patient or authorized representative who has indicated his/her understanding and acceptance.     Dental advisory given  Plan Discussed with: CRNA and Anesthesiologist  Anesthesia Plan Comments: ( )         Anesthesia Quick Evaluation  "

## 2024-02-02 NOTE — Interval H&P Note (Signed)
 History and Physical Interval Note:  02/02/2024 8:09 AM  [name]  has presented today for surgery, with the diagnosis of internal hernia versus volvulus.  The various methods of treatment have been discussed with the patient and family. After consideration of risks, benefits and other options for treatment, the patient has consented to  Procedures: LAPAROTOMY, EXPLORATORY (N/A) as a surgical intervention.  The patient's history has been reviewed, patient examined, no change in status, stable for surgery.  I have reviewed the patient's chart and labs.  Questions were answered to the patient's satisfaction.     Sigourney Portillo

## 2024-02-02 NOTE — H&P (View-Only) (Signed)
 "   Reason for Consult:  SBO due to internal hernia v volvulus Referring Provider: Logan, PA-C  HPI  [name] is an 64 y.o. 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  Of note, patient is poor historian.  Patient was recently admitted last week to [facility] for SBO and was managed conservatively with NGT. He states he was discharged before he was tolerating PO. Continued to have small liquid bowel movements throughout the course of admission through presentation today. Patient stated he had abdominal pain yesterday which is what brought him in but denies any abdominal pain right now. He states he always has some level of low grade abdominal pain but was more severe when he presented to [facility] and yesterday to [facility]. No active nausea but states he can't tolerate PO since his admission at [facility].  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 Patient states ultimately had BKA due to complications from knee problems.  Patient has history of Factor V Leiden and is on warfarin. He also has history of remote DVT of right clavicle and LLE. His last dose of warfarin was on Monday of this week. He states on Monday his INR was checked and was 1.9. I am not able to see any of these records.  Patient had CT scan yesterday afternoon around 1400 which was concerning for SBO secondary to either internal hernia or volvulus with reversal of SMA and SMV.   Labs notable for leukocytosis to 13.2, AST 114, ALT 48, mild hyponatremia and hypochloremia as well as hypocalcemia.   10 point review of systems is negative except as listed above in HPI.  Objective  Past Medical History: Past Medical History:  Diagnosis Date   Anxiety    Depression    DVT (deep venous thrombosis) (HCC)    right clavicle and LLE   Factor V Leiden    Fractures    Frontal lobe deficit     Hirschsprung's disease (HCC)    Hypertension    Impingement syndrome of left ankle    MRSA infection    left hip    OA (osteoarthritis)    left ankle   Sleep apnea    wears CPAP    Past Surgical History: Past Surgical History:  Procedure Laterality Date   ANKLE ARTHROSCOPY Left 07/14/2016   Procedure: LEFT ANKLE ARTHROSCOPY AND DEBRIDEMENT;  Surgeon: Harden Jerona GAILS, MD;  Location: [facility] OR;  Service: Orthopedics;  Laterality: Left;   ANKLE FUSION Left 04/20/2017   Procedure: LEFT TIBIOCALCANEAL FUSION;  Surgeon: Harden Jerona GAILS, MD;  Location: [facility] OR;  Service: Orthopedics;  Laterality: Left;   COLON SURGERY     Mega colon   COLONOSCOPY     HIP SURGERY Left    At [facility]   I & D EXTREMITY Right 03/17/2018   Procedure: IRRIGATION AND DEBRIDEMENT RIGHT KNEE;  Surgeon: Harden Jerona GAILS, MD;  Location: [facility] OR;  Service: Orthopedics;  Laterality: Right;   KNEE ARTHROPLASTY Bilateral    2009 by chris blackman   KNEE ARTHROSCOPY     LEFT ANKLE SCOPE WITH DEBRIDMENT  Left 07/14/2016   REVISION AMPUTATION, BELOW THE KNEE Left 11/18/2023   Procedure: REVISION BELOW THE KNEE AMPUTATION;  Surgeon: Harden Jerona GAILS, MD;  Location: [facility] OR;  Service: Orthopedics;  Laterality: Left;    Family History:  Family History  Problem Relation Age  of Onset   Heart attack Father     Social History:  reports that he has quit smoking. His smoking use included cigarettes. He has a 10 pack-year smoking history. He has never used smokeless tobacco. He reports that he does not currently use alcohol. He reports current drug use. Drug: Marijuana.  Allergies: Allergies[1]  Medications: I have reviewed the patient's current medications.  Labs: I have personally reviewed all labs for the past 24h  Imaging: I have personally reviewed and interpreted all imaging for the past 24h and agree with the radiologist's impression.  CT ABDOMEN PELVIS W CONTRAST Result Date: 02/01/2024 EXAM: CT ABDOMEN AND PELVIS WITH  CONTRAST 02/01/2024 02:20:58 PM TECHNIQUE: CT of the abdomen and pelvis was performed with the administration of 100 mL of iohexol  (OMNIPAQUE ) 350 MG/ML injection. Multiplanar reformatted images are provided for review. Automated exposure control, iterative reconstruction, and/or weight-based adjustment of the mA/kV was utilized to reduce the radiation dose to as low as reasonably achievable. COMPARISON: 01/23/2024 CLINICAL HISTORY: Bowel obstruction suspected FINDINGS: LOWER CHEST: Multifocal subsegmental atelectasis in the lung bases. Posterior bibasilar dependent atelectasis also noted. LIVER: The liver is unremarkable. GALLBLADDER AND BILE DUCTS: Gallbladder is unremarkable. No biliary ductal dilatation. SPLEEN: No acute abnormality. PANCREAS: Mild fatty atrophy of the pancreatic head and downstream body. ADRENAL GLANDS: No acute abnormality. KIDNEYS, URETERS AND BLADDER: Small nonobstructive calyceal calculi present in both kidneys. Prominent urothelial enhancement in the right renal pelvis. No hydronephrosis in either kidney. No perinephric or periureteral stranding. Circumferential wall thickening of the urinary bladder, which is not distended. Normal variant retroaortic left renal vein. GI AND BOWEL: Moderate air fluid level within the distended stomach. While there is a normal appearing duodenal C-sweep, the proximal jejunum is decompressed and courses across the abdomen towards the right upper quadrant. The majority of the small bowel is clustered in the right abdomen, dilated and fluid filled, measuring up to 6 cm. A couple of clustered segments of small bowel in the right abdomen are decompressed (axial 39) with apparent twisting on the coronal images. There is also reversal of the superior mesenteric artery (SMA) to superior mesenteric vein (SMV) relationship (axial 39) suggesting twisting. The cecum and ileocecal valve and normal appendix are present in the left lower quadrant. The remainder of the  colon is not dilated or well appreciated. Moderate air fluid level present in the rectosigmoid colon. No pneumatosis or portal venous gas. PERITONEUM AND RETROPERITONEUM: Small amount of layering ascites in the left paracolic gutter. No free air. No pneumoperitoneum. VASCULATURE: Aorta is normal in caliber. Minimal bilateral iliac atherosclerosis. Reversal of the SMA to SMV relationship (axial 39) suggesting twisting. LYMPH NODES: No lymphadenopathy. REPRODUCTIVE ORGANS: Mild prostatomegaly. Hypodense nodule filling the right prostate gland measuring 3.8 cm. No free pelvic fluid. BONES AND SOFT TISSUES: Osteopenia. The left hip arthroplasty is anatomically aligned without dislocation. Multilevel degenerative disc disease of the spine. Small fat containing paraumbilical right ventral hernia. Moderate sized fat containing right inguinal hernia with a small left inguinal hernia, without changes of the vascular compromised. No acute osseous abnormality. IMPRESSION: 1. Abnormal, fluid-filled dilation of the stomach and majority of the small bowel, which is clustered in the right abdomen cecum and appendix are noted in the left lower quadrant. While a definite transition point is difficult to appreciate, there is apparent twisting of a couple of segments of small bowel in the right abdomen with reversal of the SMA/SMV relationship, suggestive of a small bowel obstruction either from internal hernia  or volvulus. No pneumatosis, portal venous gas, or pneumoperitoneum at this time. Fluoroscopic upper GI series with small bowel follow-through may be of benefit for further characterization. 2. Small amount of layering ascites in the left paracolic gutter, likely reactive. 3. Circumferential wall thickening of the urinary bladder with mild urothelial enhancement of the right renal pelvis, suggesting an ascending urinary tract infection. Correlation with urinalysis recommended. 4. Nonobstructive nephrolithiasis bilaterally. No  hydronephrosis. Electronically signed by: Rogelia Myers MD MD 02/01/2024 03:07 PM EST RP Workstation: [id number]     Physical Exam Blood pressure 110/70, pulse 70, temperature 98 F (36.7 C), resp. rate 16, height 5' 11 (1.803 m), weight 129.7 kg, SpO2 98%. General: no acute distress HEENT: normocephalic, atraumatic Oropharynx: mucous membranes dry CV: Regular rate and rhythm, normotensive Chest: equal chest rise bilaterally normal respiratory effort on room air Abdomen: soft, nontender, moderately distended, and protuberant. Has remote laparotomy scar as well as prior ileostomy site scar. Extremities: moves all extremities Skin: warm, dry, no rashes Psych: normal memory, normal mood/affect  Neuro: No focal neurologic deficits, A&Ox3    Assessment   ODDIE BOTTGER is an 64 y.o. male with SBO secondary to internal hernia v volvulus  Plan  - Recommend medicine admission - NGT placement as this will aid in decompression to aid with surgical intervention - Repeat labs including CBC, CMP as well as lactic acid, INR and type and screen  - Pending INR results may need vitamin K , FFP or Kcentra  - NPO, IVF per TRH - DVT - SCDs, hold warfarin - Dispo - OR later this AM for exploratory laparotomy, lysis of adhesions with Dr. Rubin  - I discussed risks of surgery including but not limited to: bleeding, infection, bowel resection, primary anastomosis versus ostomy, injury to surrounding structures. I discussed with patient that given he has failed non operative management and that his CT is concerning for mesenteric swirling suggesting internal hernia or volvulus causing obstruction, I do not think this will resolve without operative intervention. Patient voiced understanding and wishes to proceed.   I reviewed ED provider notes, last 24 h vitals and pain scores, last 48 h intake and output, last 24 h labs and trends, and last 24 h imaging results.  This care required high  level of medical  decision making.   I spent a total of 85 minutes in both face-to-face and non-face-to-face activities, excluding procedures performed, for this visit on the date of this encounter. I personally reviewed all labs and imaging. I discussed plan of care with EDP, PA McCauley. I personally reviewed imaging from 1/7 CT AP as well as CT AP from 12/29 and discussed comparison of CT scans with on call radiologist.   Orie Silversmith, MD General Surgery, Surgical Critical Care and Trauma        [1] No Known Allergies  "

## 2024-02-02 NOTE — Anesthesia Procedure Notes (Signed)
 Procedure Name: Intubation Date/Time: 02/02/2024 9:04 AM  Performed by: Virgil Ee, CRNAPre-anesthesia Checklist: Patient identified, Patient being monitored, Timeout performed, Emergency Drugs available and Suction available Patient Re-evaluated:Patient Re-evaluated prior to induction Oxygen Delivery Method: Circle system utilized Preoxygenation: Pre-oxygenation with 100% oxygen Induction Type: IV induction Ventilation: Mask ventilation without difficulty Laryngoscope Size: Glidescope and 4 Grade View: Grade I Tube type: Oral Tube size: 7.5 mm Number of attempts: 1 Airway Equipment and Method: Stylet Placement Confirmation: ETT inserted through vocal cords under direct vision, positive ETCO2 and breath sounds checked- equal and bilateral Secured at: 23 cm Tube secured with: Tape Dental Injury: Teeth and Oropharynx as per pre-operative assessment

## 2024-02-02 NOTE — Progress Notes (Signed)
 PHARMACY - ANTICOAGULATION CONSULT NOTE  Pharmacy Consult for Coumadin  Indication: DVT/Factor V Leiden  Allergies[1]  Patient Measurements: Height: 5' 11 (180.3 cm) Weight: 129.7 kg (286 lb) IBW/kg (Calculated) : 75.3 HEPARIN  DW (KG): 104.8  Vital Signs: Temp: 98 F (36.7 C) (01/08 0414) BP: 110/70 (01/08 0414) Pulse Rate: 70 (01/08 0414)  Labs: Recent Labs    02/01/24 1345 02/01/24 1353 02/02/24 0437 02/02/24 0456  HGB 15.3 14.5 14.3  --   HCT 45.0 43.1 43.8  --   PLT  --  213 213  --   LABPROT  --   --  40.4*  --   INR  --   --  4.0*  --   CREATININE 0.80 0.74  --  0.71    Estimated Creatinine Clearance: 131.5 mL/min (by C-G formula based on SCr of 0.71 mg/dL).   Medical History: Past Medical History:  Diagnosis Date   Anxiety    Depression    DVT (deep venous thrombosis) (HCC)    right clavicle and LLE   Factor V Leiden    Fractures    Frontal lobe deficit    Hirschsprung's disease (HCC)    Hypertension    Impingement syndrome of left ankle    MRSA infection    left hip    OA (osteoarthritis)    left ankle   Sleep apnea    wears CPAP    Medications:  Medications Ordered Prior to Encounter[2]   Assessment: 64 y.o. male admitted with small bowel obstruction, h/o DVT and Factor V Leiden and Coumadin  on hold for OR, to receive Vitamin K  and KCentra    Goal of Therapy:  Monitor platelets by anticoagulation protocol: Yes   Plan:  F/U anticoagulation plan post-op  Bresha Hosack, Cordella Misty 02/02/2024,6:16 AM      [1] No Known Allergies [2]  No current facility-administered medications on file prior to encounter.   Current Outpatient Medications on File Prior to Encounter  Medication Sig Dispense Refill   atorvastatin  (LIPITOR) 20 MG tablet Take 20 mg by mouth daily.      busPIRone  (BUSPAR ) 15 MG tablet Take 15 mg by mouth 3 (three) times daily.     cariprazine  (VRAYLAR ) 3 MG capsule Take 3 mg by mouth in the morning and at bedtime.      enoxaparin  (LOVENOX ) 150 MG/ML injection Inject 0.94 mLs (140 mg total) into the skin 2 (two) times daily for 5 days. Follow up INR 11/25/2023 10 mL 0   FLUoxetine  (PROZAC ) 40 MG capsule Take 40 mg by mouth daily.     gabapentin  (NEURONTIN ) 100 MG tablet Take 100 mg by mouth 6 (six) times daily.     oxyCODONE -acetaminophen  (PERCOCET/ROXICET) 5-325 MG tablet Take 1 tablet by mouth every 4 (four) hours as needed. 30 tablet 0   rOPINIRole  (REQUIP ) 0.5 MG tablet Take 0.5 mg by mouth daily.     tamsulosin  (FLOMAX ) 0.4 MG CAPS capsule Take 0.4 mg by mouth at bedtime.     traZODone  (DESYREL ) 100 MG tablet Take 100 mg by mouth 2 (two) times daily.     warfarin (COUMADIN ) 5 MG tablet Take 5 mg by mouth every evening.

## 2024-02-02 NOTE — Consult Note (Addendum)
 "   Reason for Consult:  SBO due to internal hernia v volvulus Referring Provider: Logan, PA-C  HPI  [name] is an 64 y.o. 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  Of note, patient is poor historian.  Patient was recently admitted last week to [facility] for SBO and was managed conservatively with NGT. He states he was discharged before he was tolerating PO. Continued to have small liquid bowel movements throughout the course of admission through presentation today. Patient stated he had abdominal pain yesterday which is what brought him in but denies any abdominal pain right now. He states he always has some level of low grade abdominal pain but was more severe when he presented to [facility] and yesterday to [facility]. No active nausea but states he can't tolerate PO since his admission at [facility].  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 Patient states ultimately had BKA due to complications from knee problems.  Patient has history of Factor V Leiden and is on warfarin. He also has history of remote DVT of right clavicle and LLE. His last dose of warfarin was on Monday of this week. He states on Monday his INR was checked and was 1.9. I am not able to see any of these records.  Patient had CT scan yesterday afternoon around 1400 which was concerning for SBO secondary to either internal hernia or volvulus with reversal of SMA and SMV.   Labs notable for leukocytosis to 13.2, AST 114, ALT 48, mild hyponatremia and hypochloremia as well as hypocalcemia.   10 point review of systems is negative except as listed above in HPI.  Objective  Past Medical History: Past Medical History:  Diagnosis Date   Anxiety    Depression    DVT (deep venous thrombosis) (HCC)    right clavicle and LLE   Factor V Leiden    Fractures    Frontal lobe deficit     Hirschsprung's disease (HCC)    Hypertension    Impingement syndrome of left ankle    MRSA infection    left hip    OA (osteoarthritis)    left ankle   Sleep apnea    wears CPAP    Past Surgical History: Past Surgical History:  Procedure Laterality Date   ANKLE ARTHROSCOPY Left 07/14/2016   Procedure: LEFT ANKLE ARTHROSCOPY AND DEBRIDEMENT;  Surgeon: Harden Jerona GAILS, MD;  Location: [facility] OR;  Service: Orthopedics;  Laterality: Left;   ANKLE FUSION Left 04/20/2017   Procedure: LEFT TIBIOCALCANEAL FUSION;  Surgeon: Harden Jerona GAILS, MD;  Location: [facility] OR;  Service: Orthopedics;  Laterality: Left;   COLON SURGERY     Mega colon   COLONOSCOPY     HIP SURGERY Left    At [facility]   I & D EXTREMITY Right 03/17/2018   Procedure: IRRIGATION AND DEBRIDEMENT RIGHT KNEE;  Surgeon: Harden Jerona GAILS, MD;  Location: [facility] OR;  Service: Orthopedics;  Laterality: Right;   KNEE ARTHROPLASTY Bilateral    2009 by chris blackman   KNEE ARTHROSCOPY     LEFT ANKLE SCOPE WITH DEBRIDMENT  Left 07/14/2016   REVISION AMPUTATION, BELOW THE KNEE Left 11/18/2023   Procedure: REVISION BELOW THE KNEE AMPUTATION;  Surgeon: Harden Jerona GAILS, MD;  Location: [facility] OR;  Service: Orthopedics;  Laterality: Left;    Family History:  Family History  Problem Relation Age  of Onset   Heart attack Father     Social History:  reports that he has quit smoking. His smoking use included cigarettes. He has a 10 pack-year smoking history. He has never used smokeless tobacco. He reports that he does not currently use alcohol. He reports current drug use. Drug: Marijuana.  Allergies: Allergies[1]  Medications: I have reviewed the patient's current medications.  Labs: I have personally reviewed all labs for the past 24h  Imaging: I have personally reviewed and interpreted all imaging for the past 24h and agree with the radiologist's impression.  CT ABDOMEN PELVIS W CONTRAST Result Date: 02/01/2024 EXAM: CT ABDOMEN AND PELVIS WITH  CONTRAST 02/01/2024 02:20:58 PM TECHNIQUE: CT of the abdomen and pelvis was performed with the administration of 100 mL of iohexol  (OMNIPAQUE ) 350 MG/ML injection. Multiplanar reformatted images are provided for review. Automated exposure control, iterative reconstruction, and/or weight-based adjustment of the mA/kV was utilized to reduce the radiation dose to as low as reasonably achievable. COMPARISON: 01/23/2024 CLINICAL HISTORY: Bowel obstruction suspected FINDINGS: LOWER CHEST: Multifocal subsegmental atelectasis in the lung bases. Posterior bibasilar dependent atelectasis also noted. LIVER: The liver is unremarkable. GALLBLADDER AND BILE DUCTS: Gallbladder is unremarkable. No biliary ductal dilatation. SPLEEN: No acute abnormality. PANCREAS: Mild fatty atrophy of the pancreatic head and downstream body. ADRENAL GLANDS: No acute abnormality. KIDNEYS, URETERS AND BLADDER: Small nonobstructive calyceal calculi present in both kidneys. Prominent urothelial enhancement in the right renal pelvis. No hydronephrosis in either kidney. No perinephric or periureteral stranding. Circumferential wall thickening of the urinary bladder, which is not distended. Normal variant retroaortic left renal vein. GI AND BOWEL: Moderate air fluid level within the distended stomach. While there is a normal appearing duodenal C-sweep, the proximal jejunum is decompressed and courses across the abdomen towards the right upper quadrant. The majority of the small bowel is clustered in the right abdomen, dilated and fluid filled, measuring up to 6 cm. A couple of clustered segments of small bowel in the right abdomen are decompressed (axial 39) with apparent twisting on the coronal images. There is also reversal of the superior mesenteric artery (SMA) to superior mesenteric vein (SMV) relationship (axial 39) suggesting twisting. The cecum and ileocecal valve and normal appendix are present in the left lower quadrant. The remainder of the  colon is not dilated or well appreciated. Moderate air fluid level present in the rectosigmoid colon. No pneumatosis or portal venous gas. PERITONEUM AND RETROPERITONEUM: Small amount of layering ascites in the left paracolic gutter. No free air. No pneumoperitoneum. VASCULATURE: Aorta is normal in caliber. Minimal bilateral iliac atherosclerosis. Reversal of the SMA to SMV relationship (axial 39) suggesting twisting. LYMPH NODES: No lymphadenopathy. REPRODUCTIVE ORGANS: Mild prostatomegaly. Hypodense nodule filling the right prostate gland measuring 3.8 cm. No free pelvic fluid. BONES AND SOFT TISSUES: Osteopenia. The left hip arthroplasty is anatomically aligned without dislocation. Multilevel degenerative disc disease of the spine. Small fat containing paraumbilical right ventral hernia. Moderate sized fat containing right inguinal hernia with a small left inguinal hernia, without changes of the vascular compromised. No acute osseous abnormality. IMPRESSION: 1. Abnormal, fluid-filled dilation of the stomach and majority of the small bowel, which is clustered in the right abdomen cecum and appendix are noted in the left lower quadrant. While a definite transition point is difficult to appreciate, there is apparent twisting of a couple of segments of small bowel in the right abdomen with reversal of the SMA/SMV relationship, suggestive of a small bowel obstruction either from internal hernia  or volvulus. No pneumatosis, portal venous gas, or pneumoperitoneum at this time. Fluoroscopic upper GI series with small bowel follow-through may be of benefit for further characterization. 2. Small amount of layering ascites in the left paracolic gutter, likely reactive. 3. Circumferential wall thickening of the urinary bladder with mild urothelial enhancement of the right renal pelvis, suggesting an ascending urinary tract infection. Correlation with urinalysis recommended. 4. Nonobstructive nephrolithiasis bilaterally. No  hydronephrosis. Electronically signed by: Rogelia Myers MD MD 02/01/2024 03:07 PM EST RP Workstation: [id number]     Physical Exam Blood pressure 110/70, pulse 70, temperature 98 F (36.7 C), resp. rate 16, height 5' 11 (1.803 m), weight 129.7 kg, SpO2 98%. General: no acute distress HEENT: normocephalic, atraumatic Oropharynx: mucous membranes dry CV: Regular rate and rhythm, normotensive Chest: equal chest rise bilaterally normal respiratory effort on room air Abdomen: soft, nontender, moderately distended, and protuberant. Has remote laparotomy scar as well as prior ileostomy site scar. Extremities: moves all extremities Skin: warm, dry, no rashes Psych: normal memory, normal mood/affect  Neuro: No focal neurologic deficits, A&Ox3    Assessment   ODDIE BOTTGER is an 64 y.o. male with SBO secondary to internal hernia v volvulus  Plan  - Recommend medicine admission - NGT placement as this will aid in decompression to aid with surgical intervention - Repeat labs including CBC, CMP as well as lactic acid, INR and type and screen  - Pending INR results may need vitamin K , FFP or Kcentra  - NPO, IVF per TRH - DVT - SCDs, hold warfarin - Dispo - OR later this AM for exploratory laparotomy, lysis of adhesions with Dr. Rubin  - I discussed risks of surgery including but not limited to: bleeding, infection, bowel resection, primary anastomosis versus ostomy, injury to surrounding structures. I discussed with patient that given he has failed non operative management and that his CT is concerning for mesenteric swirling suggesting internal hernia or volvulus causing obstruction, I do not think this will resolve without operative intervention. Patient voiced understanding and wishes to proceed.   I reviewed ED provider notes, last 24 h vitals and pain scores, last 48 h intake and output, last 24 h labs and trends, and last 24 h imaging results.  This care required high  level of medical  decision making.   I spent a total of 85 minutes in both face-to-face and non-face-to-face activities, excluding procedures performed, for this visit on the date of this encounter. I personally reviewed all labs and imaging. I discussed plan of care with EDP, PA McCauley. I personally reviewed imaging from 1/7 CT AP as well as CT AP from 12/29 and discussed comparison of CT scans with on call radiologist.   Orie Silversmith, MD General Surgery, Surgical Critical Care and Trauma        [1] No Known Allergies  "

## 2024-02-03 ENCOUNTER — Encounter (HOSPITAL_COMMUNITY): Payer: Self-pay | Admitting: General Surgery

## 2024-02-03 DIAGNOSIS — E669 Obesity, unspecified: Secondary | ICD-10-CM | POA: Diagnosis not present

## 2024-02-03 DIAGNOSIS — I824Y9 Acute embolism and thrombosis of unspecified deep veins of unspecified proximal lower extremity: Secondary | ICD-10-CM

## 2024-02-03 DIAGNOSIS — D6851 Activated protein C resistance: Secondary | ICD-10-CM | POA: Diagnosis not present

## 2024-02-03 DIAGNOSIS — K56609 Unspecified intestinal obstruction, unspecified as to partial versus complete obstruction: Secondary | ICD-10-CM | POA: Diagnosis not present

## 2024-02-03 LAB — CBC
HCT: 43.2 % (ref 39.0–52.0)
Hemoglobin: 14.4 g/dL (ref 13.0–17.0)
MCH: 30.4 pg (ref 26.0–34.0)
MCHC: 33.3 g/dL (ref 30.0–36.0)
MCV: 91.3 fL (ref 80.0–100.0)
Platelets: 256 K/uL (ref 150–400)
RBC: 4.73 MIL/uL (ref 4.22–5.81)
RDW: 13.2 % (ref 11.5–15.5)
WBC: 10.5 K/uL (ref 4.0–10.5)
nRBC: 0 % (ref 0.0–0.2)

## 2024-02-03 LAB — BASIC METABOLIC PANEL WITH GFR
Anion gap: 8 (ref 5–15)
BUN: 10 mg/dL (ref 8–23)
CO2: 30 mmol/L (ref 22–32)
Calcium: 8.7 mg/dL — ABNORMAL LOW (ref 8.9–10.3)
Chloride: 103 mmol/L (ref 98–111)
Creatinine, Ser: 0.58 mg/dL — ABNORMAL LOW (ref 0.61–1.24)
GFR, Estimated: >60 mL/min
Glucose, Bld: 115 mg/dL — ABNORMAL HIGH (ref 70–99)
Potassium: 3.6 mmol/L (ref 3.5–5.1)
Sodium: 140 mmol/L (ref 135–145)

## 2024-02-03 MED ORDER — HEPARIN (PORCINE) 25000 UT/250ML-% IV SOLN
2200.0000 [IU]/h | INTRAVENOUS | Status: DC
  Administered 2024-02-03: 1700 [IU]/h via INTRAVENOUS
  Administered 2024-02-05 – 2024-02-07 (×7): 2200 [IU]/h via INTRAVENOUS
  Filled 2024-02-03 (×9): qty 250

## 2024-02-03 MED ORDER — DEXTROSE-SODIUM CHLORIDE 5-0.9 % IV SOLN: INTRAVENOUS | Status: AC

## 2024-02-03 NOTE — Progress Notes (Signed)
 PHARMACY - ANTICOAGULATION CONSULT NOTE  Pharmacy Consult for Heparin  Infusion Indication: factor V Leiden, left lower extremity DVT and right clavicle DVT   Allergies[1]  Patient Measurements: Height: (P) 5' 11 (180.3 cm) Weight: (P) 129.7 kg (286 lb) IBW/kg (Calculated) : (P) 75.3 HEPARIN  DW (KG): (P) 104.8  Vital Signs: Temp: 98.2 F (36.8 C) (01/09 1505) BP: 123/77 (01/09 1505) Pulse Rate: 71 (01/09 1505)  Labs: Recent Labs    02/01/24 1353 02/02/24 0437 02/02/24 0456 02/02/24 0650 02/03/24 0548  HGB 14.5 14.3  --   --  14.4  HCT 43.1 43.8  --   --  43.2  PLT 213 213  --   --  256  LABPROT  --  40.4*  --  15.4*  --   INR  --  4.0*  --  1.2  --   CREATININE 0.74  --  0.71  --  0.58*    Estimated Creatinine Clearance: 131.5 mL/min (A) (by C-G formula based on SCr of 0.58 mg/dL (L)).  Assessment: 63 YOM. POD1 from ex-lap. Patient has factor V leiden and DVT, on warfarin which was reversed on 1/8 with vitamin K  and 4FPCC. Heparin  infusion consult ordered, no bolus.   Goal of Therapy:  Heparin  level 0.3-0.5 units/ml Monitor platelets by anticoagulation protocol: Yes   Plan:  Start heparin  infusion at 1700 units/hr Check anti-Xa level with AM labs and daily while on heparin  Continue to monitor H&H and platelets  Larraine Brazier, PharmD Clinical Pharmacist 02/03/2024  6:24 PM **Pharmacist phone directory can now be found on [email or web address] ([email or web address]).  Listed under [facility].        [1]  Allergies Allergen Reactions   Nsaids Other (See Comments)    Factor V Leiden

## 2024-02-03 NOTE — Progress Notes (Signed)
 "        [facility]                                                                               [name], is a 64 y.o. male, DOB - 08-Dec-1960, FMW:[id number] Admit date - 02/01/2024    Outpatient Primary MD for the patient is Erik Chow, MD  LOS - 1  days    Brief summary   [name] is a 64 y.o. male with medical history significant for Hirschsprung's disease, had partial colectomy as an infant, factor V Leiden, left lower extremity DVT and right clavicle DVT on Coumadin , history of MRSA infection of left hip, left below the knee amputation, obesity, OSA, recently diagnosed SBO at [facility], was hospitalized for a week and was discharged on Friday 01/27/24.  Endorses severe intermittent abdominal pain, associated with nausea and vomiting, and watery stools.  Due to concern for internal hernia or volvulus, general surgery is planning to take to the OR   Assessment & Plan    Assessment and Plan:   SBO S/p exploratory laparotomy by Dr Rubin on 1/8. Pain control. Mobilize.  S/p NGT  Npo with sips.  Continue with IV fluids.     factor V Leiden, left lower extremity DVT and right clavicle DVT  Patient on warfarin, at home, which is on hold. Gen surgery  okay with starting  heparin  after 24 hours. Heparin  consult with pharmacy placed without bolus as hemoglobin remains stable.    Body mass index is 39.89 kg/m (pended). Obesity:    BPH Resume flomax .    Depression:  Resume prozac .     Estimated body mass index is 39.89 kg/m (pended) as calculated from the following:   Height as of this encounter: (P) 5' 11 (1.803 m).   Weight as of this encounter: (P) 129.7 kg.  Code Status: full code.  DVT Prophylaxis:  SCD's Start: 02/02/24 1539   Level of Care: Level of care: Med-Surg Family Communication:   Disposition Plan:     Remains inpatient appropriate:  pending clinical improvement.   Procedures:  OR today.   Consultants:   Gen surgery.    Antimicrobials:   Anti-infectives (From admission, onward)    Start     Dose/Rate Route Frequency Ordered Stop   02/02/24 0630  ceFAZolin  (ANCEF ) IVPB 3g/150 mL premix        3 g 300 mL/hr over 30 Minutes Intravenous On call to O.R. 02/02/24 0625 02/02/24 0935        Medications  Scheduled Meds:  atorvastatin   20 mg Oral Daily   busPIRone   15 mg Oral TID   cariprazine   3 mg Oral q AM   FLUoxetine   40 mg Oral Daily   gabapentin   200 mg Oral TID   rOPINIRole   0.5 mg Oral QHS   tamsulosin   0.4 mg Oral QHS   temazepam   30 mg Oral QHS   traZODone   200 mg Oral QHS   Continuous Infusions:  dextrose  5 % and 0.9 % NaCl     PRN Meds:.acetaminophen , HYDROmorphone  (DILAUDID ) injection, ondansetron  **OR** ondansetron  (ZOFRAN ) IV    Subjective:   [name] was  seen and examined today.  Does not want to work with PT today.   Objective:   Vitals:   02/02/24 2329 02/03/24 0421 02/03/24 0828 02/03/24 1505  BP: 125/73 127/78 124/76 123/77  Pulse: 71 72 72 71  Resp: 17 19 16 16   Temp: 97.7 F (36.5 C) 98.4 F (36.9 C) 97.7 F (36.5 C) 98.2 F (36.8 C)  TempSrc: Oral Oral    SpO2: 93% 92% 94% 98%  Weight:      Height:        Intake/Output Summary (Last 24 hours) at 02/03/2024 1814 Last data filed at 02/03/2024 1500 Gross per 24 hour  Intake --  Output 1835 ml  Net -1835 ml   Filed Weights   02/01/24 1331 02/02/24 0756  Weight: 129.7 kg (P) 129.7 kg   General exam: Appears calm and comfortable  Respiratory system: Clear to auscultation. Respiratory effort normal. Cardiovascular system: S1 & S2 heard, RRR. No JVD,  Gastrointestinal system: Abdomen is soft, midline wound is c/d/I. Mild tenderness. Bs minimal.  Central nervous system: Alert and oriented. No focal neurological deficits. Extremities: Symmetric 5 x 5 power. Skin: No rashes, lesions or ulcers Psychiatry: Mood & affect appropriate.     Data Reviewed:  I have personally reviewed following labs and  imaging studies   CBC Lab Results  Component Value Date   WBC 10.5 02/03/2024   RBC 4.73 02/03/2024   HGB 14.4 02/03/2024   HCT 43.2 02/03/2024   MCV 91.3 02/03/2024   MCH 30.4 02/03/2024   PLT 256 02/03/2024   MCHC 33.3 02/03/2024   RDW 13.2 02/03/2024   LYMPHSABS 1.6 02/01/2024   MONOABS 0.9 02/01/2024   EOSABS 0.0 02/01/2024   BASOSABS 0.0 02/01/2024     Last metabolic panel Lab Results  Component Value Date   NA 140 02/03/2024   K 3.6 02/03/2024   CL 103 02/03/2024   CO2 30 02/03/2024   BUN 10 02/03/2024   CREATININE 0.58 (L) 02/03/2024   GLUCOSE 115 (H) 02/03/2024   GFRNONAA >60 02/03/2024   GFRAA >60 03/21/2018   CALCIUM  8.7 (L) 02/03/2024   PROT 7.0 02/02/2024   ALBUMIN  3.7 02/02/2024   BILITOT 0.5 02/02/2024   ALKPHOS 74 02/02/2024   AST 108 (H) 02/02/2024   ALT 50 (H) 02/02/2024   ANIONGAP 8 02/03/2024    CBG (last 3)  No results for input(s): GLUCAP in the last 72 hours.    Coagulation Profile: Recent Labs  Lab 02/02/24 0437 02/02/24 0650  INR 4.0* 1.2     Radiology Studies: DG Abd Portable 1V Result Date: 02/02/2024 CLINICAL DATA:  NG tube placement. EXAM: PORTABLE ABDOMEN - 1 VIEW COMPARISON:  01/26/2024 FINDINGS: NG tube tip is in the fundus of the stomach with proximal side port below the GE junction. Gaseous distention of bowel in the upper abdomen again noted. IMPRESSION: NG tube tip is in the fundus of the stomach. Electronically Signed   By: Camellia Candle M.D.   On: 02/02/2024 05:33       Elgie Butter M.D. [facility] 02/03/2024, 6:14 PM  Available via Epic secure chat 7am-7pm After 7 pm, please refer to night coverage provider listed on amion.    "

## 2024-02-03 NOTE — Evaluation (Signed)
 Physical Therapy Evaluation Patient Details Name: [name] MRN: [id number] DOB: 17-Oct-1960 Today's Date: 02/03/2024  History of Present Illness  Patient is a 64 y/o male admitted 02/01/24 due to abdominal pain/N/V.  Recent stay at [facility] x 1 week for SBO, d/c 01/27/24.  Concern for internal hernia vs. Volvulus and taken to surgery for ex lap 02/02/24.  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  Clinical Impression  Patient presents with decreased mobility due to pain, weakness and decreased activity tolerance.  Previously able to mobilize at wheelchair level on his own with help from his roommate/caregiver to enter the home due to 3 steps.  Patient just picked up his new prosthetic prior to admission (had revision of BKA as above).  Due to pain he preferred EOB mobility only today.  Able to move himself using bed rails with min A to and from EOB and completed scooting along the EOB.  Feel he will benefit from skilled PT in the acute setting to progress mobility as tolerated and from HHPT at d/c.         If plan is discharge home, recommend the following: A little help with walking and/or transfers;A little help with bathing/dressing/bathroom;Help with stairs or ramp for entrance;Assistance with cooking/housework;Assist for transportation   Can travel by private vehicle        Equipment Recommendations None recommended by PT  Recommendations for Other Services       Functional Status Assessment Patient has had a recent decline in their functional status and demonstrates the ability to make significant improvements in function in a reasonable and predictable amount of time.     Precautions / Restrictions Precautions Precautions: Fall Precaution/Restrictions Comments: NGT to intermittent suction, abdominal binder, L BKA,  prosthetic in the room      Mobility  Bed Mobility Overal bed mobility: Needs Assistance Bed Mobility: Supine to Sit, Sit to Supine     Supine to sit: Min assist, HOB elevated, Used rails Sit to supine: Min assist, Used rails   General bed mobility comments: cues for technique though pt sitting up pulling on rails and a little HHA after moving legs off EOB; to supine encouraged to use foam pads for splinting abdomen, though moving legs onto bed then laying down, assist for repositioning pad under hips    Transfers Overall transfer level: Needs assistance   Transfers: Bed to chair/wheelchair/BSC            Lateral/Scoot Transfers: Supervision General transfer comment: sat EOB only then scooting toward HOB prior to laying down with rail and S    Ambulation/Gait               General Gait Details: declined OOB mobility today  Stairs            Wheelchair Mobility     Tilt Bed    Modified Rankin (Stroke Patients Only)       Balance Overall balance assessment: Needs assistance   Sitting balance-Leahy Scale: Good Sitting balance - Comments: sat EOB about 15 minutes working legs and encouragement for deep breathing                                     Pertinent Vitals/Pain Pain Assessment Pain Assessment:  0-10 Pain Score: 5  Pain Location: mid abdomen surgical site Pain Descriptors / Indicators: Discomfort, Operative site guarding Pain Intervention(s): Monitored during session, Premedicated before session    Home Living Family/patient expects to be discharged to:: Private residence Living Arrangements: Other (Comment) (caregiver/roomate) Available Help at Discharge: Available PRN/intermittently Type of Home: Apartment Home Access: Stairs to enter   Entrance Stairs-Number of Steps: 3   Home Layout: One level Home Equipment: Agricultural Consultant (2 wheels);Cane - single point;Wheelchair - Careers Adviser (comment);Electric scooter;Shower  seat Additional Comments: knee scooter    Prior Function               Mobility Comments: just got new prosthetic since amputation revision prior to admission, using wheelchair with assist up steps when not using prosthetic ADLs Comments: roomate does cooking and cleaning mostly     Extremity/Trunk Assessment   Upper Extremity Assessment Upper Extremity Assessment: Overall WFL for tasks assessed    Lower Extremity Assessment Lower Extremity Assessment: LLE deficits/detail;Generalized weakness LLE Deficits / Details: L BKA, well healed post recent revision    Cervical / Trunk Assessment Cervical / Trunk Assessment: Other exceptions Cervical / Trunk Exceptions: abdominal surgery  Communication   Communication Communication: Impaired Factors Affecting Communication: Hearing impaired    Cognition Arousal: Alert Behavior During Therapy: WFL for tasks assessed/performed   PT - Cognitive impairments: No apparent impairments                         Following commands: Intact       Cueing Cueing Techniques: Verbal cues     General Comments General comments (skin integrity, edema, etc.): VSS, on 2L O2 at rest, removed while sitting and SpO2 92% at lowest, reapplied once supine    Exercises     Assessment/Plan    PT Assessment Patient needs continued PT services  PT Problem List Decreased activity tolerance;Decreased balance;Decreased mobility;Decreased strength       PT Treatment Interventions DME instruction;Patient/family education;Functional mobility training;Therapeutic activities;Therapeutic exercise;Balance training;Gait training    PT Goals (Current goals can be found in the Care Plan section)  Acute Rehab PT Goals Patient Stated Goal: get stronger, eat then go home PT Goal Formulation: With patient Time For Goal Achievement: 02/17/24 Potential to Achieve Goals: Good    Frequency Min 3X/week     Co-evaluation               AM-PAC  PT 6 Clicks Mobility  Outcome Measure Help needed turning from your back to your side while in a flat bed without using bedrails?: A Little Help needed moving from lying on your back to sitting on the side of a flat bed without using bedrails?: A Little Help needed moving to and from a bed to a chair (including a wheelchair)?: Total Help needed standing up from a chair using your arms (e.g., wheelchair or bedside chair)?: Total Help needed to walk in hospital room?: Total Help needed climbing 3-5 steps with a railing? : Total 6 Click Score: 10    End of Session Equipment Utilized During Treatment: Oxygen Activity Tolerance: Patient limited by pain Patient left: in bed;with call bell/phone within reach   PT Visit Diagnosis: Muscle weakness (generalized) (M62.81);Other abnormalities of gait and mobility (R26.89)    Time: 8779-8746 PT Time Calculation (min) (ACUTE ONLY): 33 min   Charges:   PT Evaluation $PT Eval Moderate Complexity: 1 Mod PT Treatments $Therapeutic Activity: 8-22 mins PT General Charges $$ ACUTE PT  VISIT: 1 Visit         Micheline Portal, PT Acute Rehabilitation Services [phone] 02/03/2024   Montie Portal 02/03/2024, 1:52 PM

## 2024-02-03 NOTE — Plan of Care (Signed)

## 2024-02-03 NOTE — Progress Notes (Signed)
 "  Progress Note  1 Day Post-Op  Subjective: Patient reports that pain is manageable with medications that he has received this morning. Denies n/v. Currently NPO with NGT. No BM or flatulence yet.  ROS  All negative with the exception of above.  Objective: Vital signs in last 24 hours: Temp:  [97.7 F (36.5 C)-98.4 F (36.9 C)] 97.7 F (36.5 C) (01/09 0828) Pulse Rate:  [69-79] 72 (01/09 0828) Resp:  [13-20] 16 (01/09 0828) BP: (115-135)/(67-82) 124/76 (01/09 0828) SpO2:  [91 %-96 %] 94 % (01/09 0828) Last BM Date : 01/31/24  Intake/Output from previous day: 01/08 0701 - 01/09 0700 In: 500 [IV Piggyback:500] Out: 1740 [Urine:900; Emesis/NG output:820; Blood:20] Intake/Output this shift: No intake/output data recorded.  PE: General: Pleasant male who is laying in bed in NAD. HEENT: Head is normocephalic, atraumatic. Heart: HR normal during encounter. Lungs: Respiratory effort nonlabored. Abd: Soft, ND. Appropriate generalized tenderness to palpation. Midline incision with staples covered with honeycomb dressing. No active bleeding or discharge. No rebound tenderness or guarding.  Skin: Warm and dry.  Psych: A&Ox3 with an appropriate affect.    Lab Results:  Recent Labs    02/02/24 0437 02/03/24 0548  WBC 8.7 10.5  HGB 14.3 14.4  HCT 43.8 43.2  PLT 213 256   BMET Recent Labs    02/02/24 0456 02/03/24 0548  NA 137 140  K 3.5 3.6  CL 99 103  CO2 27 30  GLUCOSE 95 115*  BUN 13 10  CREATININE 0.71 0.58*  CALCIUM  9.3 8.7*   PT/INR Recent Labs    02/02/24 0437 02/02/24 0650  LABPROT 40.4* 15.4*  INR 4.0* 1.2   CMP     Component Value Date/Time   NA 140 02/03/2024 0548   K 3.6 02/03/2024 0548   CL 103 02/03/2024 0548   CO2 30 02/03/2024 0548   GLUCOSE 115 (H) 02/03/2024 0548   BUN 10 02/03/2024 0548   CREATININE 0.58 (L) 02/03/2024 0548   CALCIUM  8.7 (L) 02/03/2024 0548   PROT 7.0 02/02/2024 0456   ALBUMIN  3.7 02/02/2024 0456   AST 108 (H)  02/02/2024 0456   ALT 50 (H) 02/02/2024 0456   ALKPHOS 74 02/02/2024 0456   BILITOT 0.5 02/02/2024 0456   GFRNONAA >60 02/03/2024 0548   GFRAA >60 03/21/2018 0623   Lipase     Component Value Date/Time   LIPASE 15 02/01/2024 1353       Studies/Results: DG Abd Portable 1V Result Date: 02/02/2024 CLINICAL DATA:  NG tube placement. EXAM: PORTABLE ABDOMEN - 1 VIEW COMPARISON:  01/26/2024 FINDINGS: NG tube tip is in the fundus of the stomach with proximal side port below the GE junction. Gaseous distention of bowel in the upper abdomen again noted. IMPRESSION: NG tube tip is in the fundus of the stomach. Electronically Signed   By: Camellia Candle M.D.   On: 02/02/2024 05:33   CT ABDOMEN PELVIS W CONTRAST Result Date: 02/01/2024 EXAM: CT ABDOMEN AND PELVIS WITH CONTRAST 02/01/2024 02:20:58 PM TECHNIQUE: CT of the abdomen and pelvis was performed with the administration of 100 mL of iohexol  (OMNIPAQUE ) 350 MG/ML injection. Multiplanar reformatted images are provided for review. Automated exposure control, iterative reconstruction, and/or weight-based adjustment of the mA/kV was utilized to reduce the radiation dose to as low as reasonably achievable. COMPARISON: 01/23/2024 CLINICAL HISTORY: Bowel obstruction suspected FINDINGS: LOWER CHEST: Multifocal subsegmental atelectasis in the lung bases. Posterior bibasilar dependent atelectasis also noted. LIVER: The liver is unremarkable. GALLBLADDER AND BILE  DUCTS: Gallbladder is unremarkable. No biliary ductal dilatation. SPLEEN: No acute abnormality. PANCREAS: Mild fatty atrophy of the pancreatic head and downstream body. ADRENAL GLANDS: No acute abnormality. KIDNEYS, URETERS AND BLADDER: Small nonobstructive calyceal calculi present in both kidneys. Prominent urothelial enhancement in the right renal pelvis. No hydronephrosis in either kidney. No perinephric or periureteral stranding. Circumferential wall thickening of the urinary bladder, which is not  distended. Normal variant retroaortic left renal vein. GI AND BOWEL: Moderate air fluid level within the distended stomach. While there is a normal appearing duodenal C-sweep, the proximal jejunum is decompressed and courses across the abdomen towards the right upper quadrant. The majority of the small bowel is clustered in the right abdomen, dilated and fluid filled, measuring up to 6 cm. A couple of clustered segments of small bowel in the right abdomen are decompressed (axial 39) with apparent twisting on the coronal images. There is also reversal of the superior mesenteric artery (SMA) to superior mesenteric vein (SMV) relationship (axial 39) suggesting twisting. The cecum and ileocecal valve and normal appendix are present in the left lower quadrant. The remainder of the colon is not dilated or well appreciated. Moderate air fluid level present in the rectosigmoid colon. No pneumatosis or portal venous gas. PERITONEUM AND RETROPERITONEUM: Small amount of layering ascites in the left paracolic gutter. No free air. No pneumoperitoneum. VASCULATURE: Aorta is normal in caliber. Minimal bilateral iliac atherosclerosis. Reversal of the SMA to SMV relationship (axial 39) suggesting twisting. LYMPH NODES: No lymphadenopathy. REPRODUCTIVE ORGANS: Mild prostatomegaly. Hypodense nodule filling the right prostate gland measuring 3.8 cm. No free pelvic fluid. BONES AND SOFT TISSUES: Osteopenia. The left hip arthroplasty is anatomically aligned without dislocation. Multilevel degenerative disc disease of the spine. Small fat containing paraumbilical right ventral hernia. Moderate sized fat containing right inguinal hernia with a small left inguinal hernia, without changes of the vascular compromised. No acute osseous abnormality. IMPRESSION: 1. Abnormal, fluid-filled dilation of the stomach and majority of the small bowel, which is clustered in the right abdomen cecum and appendix are noted in the left lower quadrant. While  a definite transition point is difficult to appreciate, there is apparent twisting of a couple of segments of small bowel in the right abdomen with reversal of the SMA/SMV relationship, suggestive of a small bowel obstruction either from internal hernia or volvulus. No pneumatosis, portal venous gas, or pneumoperitoneum at this time. Fluoroscopic upper GI series with small bowel follow-through may be of benefit for further characterization. 2. Small amount of layering ascites in the left paracolic gutter, likely reactive. 3. Circumferential wall thickening of the urinary bladder with mild urothelial enhancement of the right renal pelvis, suggesting an ascending urinary tract infection. Correlation with urinalysis recommended. 4. Nonobstructive nephrolithiasis bilaterally. No hydronephrosis. Electronically signed by: Rogelia Myers MD MD 02/01/2024 03:07 PM EST RP Workstation: [id number]    Anti-infectives: Anti-infectives (From admission, onward)    Start     Dose/Rate Route Frequency Ordered Stop   02/02/24 0630  ceFAZolin  (ANCEF ) IVPB 3g/150 mL premix        3 g 300 mL/hr over 30 Minutes Intravenous On call to O.R. 02/02/24 0625 02/02/24 0935        Assessment/Plan POD1: S/P exploratory laparotomy by Dr. Rubin on 1/8 -Afebrile. -CBC WNL -Exam without significant concerns. -Pain manageable. No n/v. No BM or flatulence since surgery.  -Continue NPO and maintain NGT. NGT output recorded 820 mL -Will continue to follow.   FEN: NPO/NGT; IVF per primary team VTE:  SCDs; Okay to restart heparin  today 1/9 around 3:00PM without bolus. ID: None currently    LOS: 1 day   I reviewed specialist notes, hospitalist notes, op notes, nursing notes, last 24 h vitals and pain scores, last 48 h intake and output, last 24 h labs and trends, and last 24 h imaging results.   Marjorie Carlyon Favre, [facility] Surgery 02/03/2024, 9:35 AM Please see Amion for pager number during day hours  7:00am-4:30pm  "

## 2024-02-04 DIAGNOSIS — D6851 Activated protein C resistance: Secondary | ICD-10-CM | POA: Diagnosis not present

## 2024-02-04 DIAGNOSIS — I824Y9 Acute embolism and thrombosis of unspecified deep veins of unspecified proximal lower extremity: Secondary | ICD-10-CM | POA: Diagnosis not present

## 2024-02-04 DIAGNOSIS — E669 Obesity, unspecified: Secondary | ICD-10-CM | POA: Diagnosis not present

## 2024-02-04 DIAGNOSIS — K56609 Unspecified intestinal obstruction, unspecified as to partial versus complete obstruction: Secondary | ICD-10-CM | POA: Diagnosis not present

## 2024-02-04 LAB — CBC
HCT: 40.2 % (ref 39.0–52.0)
Hemoglobin: 13.1 g/dL (ref 13.0–17.0)
MCH: 30.4 pg (ref 26.0–34.0)
MCHC: 32.6 g/dL (ref 30.0–36.0)
MCV: 93.3 fL (ref 80.0–100.0)
Platelets: 273 K/uL (ref 150–400)
RBC: 4.31 MIL/uL (ref 4.22–5.81)
RDW: 13.5 % (ref 11.5–15.5)
WBC: 11.1 K/uL — ABNORMAL HIGH (ref 4.0–10.5)
nRBC: 0 % (ref 0.0–0.2)

## 2024-02-04 LAB — HEPARIN LEVEL (UNFRACTIONATED)
Heparin Unfractionated: <0.1 [IU]/mL — ABNORMAL LOW (ref 0.30–0.70)
Heparin Unfractionated: 0.18 [IU]/mL — ABNORMAL LOW (ref 0.30–0.70)
Heparin Unfractionated: 0.37 [IU]/mL (ref 0.30–0.70)

## 2024-02-04 NOTE — Progress Notes (Addendum)
 PHARMACY - ANTICOAGULATION CONSULT NOTE  Pharmacy Consult for Heparin  Infusion Indication: factor V Leiden, left lower extremity DVT and right clavicle DVT   Allergies[1]  Patient Measurements: Height: (P) 5' 11 (180.3 cm) Weight: (P) 129.7 kg (286 lb) IBW/kg (Calculated) : (P) 75.3 HEPARIN  DW (KG): (P) 104.8  Vital Signs: Temp: 98.5 F (36.9 C) (01/10 0314) Temp Source: Oral (01/10 0314) BP: 108/67 (01/10 0314) Pulse Rate: 80 (01/10 0314)  Labs: Recent Labs    02/01/24 1353 02/02/24 0437 02/02/24 0456 02/02/24 0650 02/03/24 0548 02/04/24 0524  HGB 14.5 14.3  --   --  14.4 13.1  HCT 43.1 43.8  --   --  43.2 40.2  PLT 213 213  --   --  256 273  LABPROT  --  40.4*  --  15.4*  --   --   INR  --  4.0*  --  1.2  --   --   HEPARINUNFRC  --   --   --   --   --  <0.10*  CREATININE 0.74  --  0.71  --  0.58*  --     Estimated Creatinine Clearance: 131.5 mL/min (A) (by C-G formula based on SCr of 0.58 mg/dL (L)).  Assessment: 89 YOM s/p ex-lap on 1/8. Patient has factor V leiden and hx DVT, on warfarin which was reversed on 1/8 with vitamin K  and 4FPCC. Heparin  infusion consult ordered, no bolus.  Heparin  level is subtherapeutic at <0.1 on 1700 units/hr. No bleeding noted, CBC stable. No infusion or bleeding issues noted by RN.  Goal of Therapy:  Heparin  level 0.3-0.5 units/ml Monitor platelets by anticoagulation protocol: Yes   Plan:  Increase heparin  infusion to 1950 units/hr 6 hr heparin  level after rate change Daily heparin  level and CBC Monitor for signs/symptoms of bleeding Follow-up ability to resume warfarin  Thank you for involving pharmacy in this patient's care.  Delon Sax, PharmD, BCPS Clinical Pharmacist Clinical phone for 02/04/2024 is [phone] 02/04/2024 7:14 AM   Addendum: Heparin  level subtherapeutic at 0.18 on 1950 units/hr. No bleeding noted.  Increase heparin  drip to 2200 units/hr 6h heparin  level  Delon Sax, PharmD, BCPS 3:15  PM        [1]  Allergies Allergen Reactions   Nsaids Other (See Comments)    Factor V Leiden

## 2024-02-04 NOTE — Progress Notes (Signed)
 PHARMACY - ANTICOAGULATION CONSULT NOTE  Pharmacy Consult for Heparin  Infusion Indication: factor V Leiden, left lower extremity DVT and right clavicle DVT   Allergies[1]  Patient Measurements: Height: (P) 5' 11 (180.3 cm) Weight: (P) 129.7 kg (286 lb) IBW/kg (Calculated) : (P) 75.3 HEPARIN  DW (KG): (P) 104.8  Vital Signs: Temp: 98.7 F (37.1 C) (01/10 2008) Temp Source: Oral (01/10 2008) BP: 121/69 (01/10 2008) Pulse Rate: 79 (01/10 2008)  Labs: Recent Labs    02/02/24 0437 02/02/24 0456 02/02/24 0650 02/03/24 0548 02/04/24 0524 02/04/24 1435 02/04/24 2243  HGB 14.3  --   --  14.4 13.1  --   --   HCT 43.8  --   --  43.2 40.2  --   --   PLT 213  --   --  256 273  --   --   LABPROT 40.4*  --  15.4*  --   --   --   --   INR 4.0*  --  1.2  --   --   --   --   HEPARINUNFRC  --   --   --   --  <0.10* 0.18* 0.37  CREATININE  --  0.71  --  0.58*  --   --   --     Estimated Creatinine Clearance: 131.5 mL/min (A) (by C-G formula based on SCr of 0.58 mg/dL (L)).  Assessment: 22 YOM s/p ex-lap on 1/8. Patient has factor V leiden and hx DVT, on warfarin which was reversed on 1/8 with vitamin K  and 4FPCC. Heparin  infusion consult ordered, no bolus.  Heparin  level is subtherapeutic at <0.1 on 1700 units/hr. No bleeding noted, CBC stable. No infusion or bleeding issues noted by RN.  1/10 PM update:  Heparin  level therapeutic after rate increase  Goal of Therapy:  Heparin  level 0.3-0.5 units/ml Monitor platelets by anticoagulation protocol: Yes   Plan:  Cont heparin  2200 units/hr Heparin  level with AM labs Follow-up ability to resume warfarin  Lynwood Mckusick, PharmD, BCPS Clinical Pharmacist Phone: [phone]         [1]  Allergies Allergen Reactions   Nsaids Other (See Comments)    Factor V Leiden

## 2024-02-04 NOTE — Progress Notes (Signed)
 "        [facility]                                                                               [name], is a 64 y.o. male, DOB - [birth year], FMW:[id number] Admit date - 02/01/2024    Outpatient Primary MD for the patient is Erik Chow, MD  LOS - 2  days    Brief summary   [name] is a 64 y.o. 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  Endorses severe intermittent abdominal pain, associated with nausea and vomiting, and watery stools.  Due to concern for internal hernia or volvulus, general surgery consulted and he underwent exp lap on 1/8.   Assessment & Plan    Assessment and Plan:   SBO S/p exploratory laparotomy by Dr Rubin on 1/8. Pain control. Mobilize.  S/p NGT  Npo with sips.  Continue with IV fluids.     factor V Leiden, left lower extremity DVT and right clavicle DVT  Patient on warfarin, at home, which is on hold. Gen surgery  okay with starting  heparin  after 24 hours. Heparin  consult with pharmacy placed without bolus as hemoglobin remains stable.  Hemoglobin around 13.    Body mass index is 39.89 kg/m (pended). Obesity:    BPH Resume flomax .    Depression:  Resume prozac .     Estimated body mass index is 39.89 kg/m (pended) as calculated from the following:   Height as of this encounter: (P) 5' 11 (1.803 m).   Weight as of this encounter: (P) 129.7 kg.  Code Status: full code.  DVT Prophylaxis:  SCD's Start: 02/02/24 1539   Level of Care: Level of care: Med-Surg Family Communication: none at bedside.   Disposition Plan:     Remains inpatient appropriate:  pending clinical improvement.    Procedures:  OR today.   Consultants:   Gen surgery.   Antimicrobials:   Anti-infectives (From admission, onward)    Start     Dose/Rate Route Frequency Ordered Stop   02/02/24 0630  ceFAZolin  (ANCEF ) IVPB 3g/150 mL premix        3 g 300 mL/hr over 30 Minutes Intravenous On call to O.R. 02/02/24 0625 02/02/24 0935        Medications  Scheduled Meds:  atorvastatin   20 mg Oral Daily   busPIRone   15 mg Oral TID   cariprazine   3 mg Oral q AM   FLUoxetine   40 mg Oral Daily   gabapentin   200 mg Oral TID   rOPINIRole   0.5 mg Oral QHS   tamsulosin   0.4 mg Oral QHS   temazepam   30 mg Oral QHS   traZODone   200 mg Oral QHS   Continuous Infusions:  dextrose  5 % and 0.9 % NaCl 100 mL/hr at 02/04/24 0547   heparin  2,200 Units/hr (02/04/24 1543)   PRN  Meds:.acetaminophen , HYDROmorphone  (DILAUDID ) injection, ondansetron  **OR** ondansetron  (ZOFRAN ) IV    Subjective:   [name] was seen and examined today.  No new complaints.   Objective:   Vitals:   02/04/24 0314 02/04/24 0325 02/04/24 0758 02/04/24 1459  BP: 108/67  114/71 (!) 110/58  Pulse: 80  78 76  Resp: 19  16 18   Temp: 98.5 F (36.9 C)  98 F (36.7 C) 98.4 F (36.9 C)  TempSrc: Oral     SpO2: (!) 89% 94% 94% 93%  Weight:      Height:        Intake/Output Summary (Last 24 hours) at 02/04/2024 1742 Last data filed at 02/04/2024 0800 Gross per 24 hour  Intake 1233.34 ml  Output 3700 ml  Net -2466.66 ml   Filed Weights   02/01/24 1331 02/02/24 0756  Weight: 129.7 kg (P) 129.7 kg   General exam: Appears calm and comfortable  Respiratory system: Clear to auscultation. Respiratory effort normal. Cardiovascular system: S1 & S2 heard, RRR. No JVD Gastrointestinal system: Abdomen is soft, mildly tender. Bs+ Central nervous system: Alert and oriented.  Extremities: Symmetric 5 x 5 power. Skin: No rashes, Psychiatry: Mood & affect appropriate.     Data Reviewed:  I have personally reviewed following  labs and imaging studies   CBC Lab Results  Component Value Date   WBC 11.1 (H) 02/04/2024   RBC 4.31 02/04/2024   HGB 13.1 02/04/2024   HCT 40.2 02/04/2024   MCV 93.3 02/04/2024   MCH 30.4 02/04/2024   PLT 273 02/04/2024   MCHC 32.6 02/04/2024   RDW 13.5 02/04/2024   LYMPHSABS 1.6 02/01/2024   MONOABS 0.9 02/01/2024   EOSABS 0.0 02/01/2024   BASOSABS 0.0 02/01/2024     Last metabolic panel Lab Results  Component Value Date   NA 140 02/03/2024   K 3.6 02/03/2024   CL 103 02/03/2024   CO2 30 02/03/2024   BUN 10 02/03/2024   CREATININE 0.58 (L) 02/03/2024   GLUCOSE 115 (H) 02/03/2024   GFRNONAA >60 02/03/2024   GFRAA >60 03/21/2018   CALCIUM  8.7 (L) 02/03/2024   PROT 7.0 02/02/2024   ALBUMIN  3.7 02/02/2024   BILITOT 0.5 02/02/2024   ALKPHOS 74 02/02/2024   AST 108 (H) 02/02/2024   ALT 50 (H) 02/02/2024   ANIONGAP 8 02/03/2024    CBG (last 3)  No results for input(s): GLUCAP in the last 72 hours.    Coagulation Profile: Recent Labs  Lab 02/02/24 0437 02/02/24 0650  INR 4.0* 1.2     Radiology Studies: No results found.      Elgie Butter M.D. [facility] 02/04/2024, 5:42 PM  Available via Epic secure chat 7am-7pm After 7 pm, please refer to night coverage provider listed on amion.    "

## 2024-02-04 NOTE — Evaluation (Signed)
 Occupational Therapy Evaluation Patient Details Name: [name] MRN: [id number] DOB: 1960-08-10 Today's Date: 02/04/2024   History of Present Illness   Patient is a 64 y/o male admitted 02/01/24 due to abdominal pain/N/V.  Recent stay at [facility] x 1 week for SBO, d/c 01/27/24.  Concern for internal hernia vs. Volvulus and taken to surgery for ex lap 02/02/24.  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     Clinical Impressions Prior to this admission, patient living in an apartment, and ambulating with a rollling walker. Currently, patient is limited by abdominal pain, as patient was able to transition to EOB at mod I however increased pain and assist requried to be able to don prosthetic sleeve, however unable to click in prosthetic due to insicional pain. Unable to progress into standing this date for this reason. Increased time spent in session with RN due to NG tube tape coming undone and affixing NG tube to gown in hopes of decreased agitation. OT recommending HHOT at discharge, will continue to follow.      If plan is discharge home, recommend the following:   A little help with walking and/or transfers;A little help with bathing/dressing/bathroom;Assistance with cooking/housework;Assist for transportation;Help with stairs or ramp for entrance     Functional Status Assessment   Patient has had a recent decline in their functional status and demonstrates the ability to make significant improvements in function in a reasonable and predictable amount of time.     Equipment Recommendations   None recommended by OT     Recommendations for Other Services         Precautions/Restrictions   Precautions Precautions: Fall Recall of Precautions/Restrictions: Intact Precaution/Restrictions Comments: NGT to  intermittent suction, abdominal binder, L BKA, prosthetic in the room Restrictions Weight Bearing Restrictions Per Provider Order: No     Mobility Bed Mobility Overal bed mobility: Needs Assistance, Modified Independent Bed Mobility: Supine to Sit           General bed mobility comments: Mod I to transition to EOB    Transfers                   General transfer comment: deferred as patient was unable to get prosthetic under him due to current abdominal pain      Balance Overall balance assessment: Needs assistance Sitting-balance support: No upper extremity supported, Feet supported Sitting balance-Leahy Scale: Good                                     ADL either performed or assessed with clinical judgement   ADL Overall ADL's : Needs assistance/impaired Eating/Feeding: Set up;Sitting   Grooming: Set up;Sitting   Upper Body Bathing: Set up;Sitting   Lower Body Bathing: Minimal assistance;Sit to/from stand;Sitting/lateral leans   Upper Body Dressing : Set up;Sitting   Lower Body Dressing: Minimal assistance;Sitting/lateral leans;Sit to/from stand Lower Body Dressing Details (indicate cue type and reason): donning prosthetic sleeve   Toilet Transfer Details (indicate cue type and reason): unable to as unable to don prosthesis Toileting- Clothing Manipulation and Hygiene: Set up;Sit to/from stand;Sitting/lateral lean       Functional mobility during ADLs: Contact guard assist;Rolling walker (2 wheels) General ADL Comments: Prior to this admission, patient living  in an apartment, and ambulating with a rollling walker. Currently, patient is limited by abdominal pain, as patient was able to transition to EOB at mod I however increased pain and assist requried to be able to don prosthetic sleeve, however unable to click in prosthetic due to insicional pain. Unable to progress into standing this date for this reason. Increased time spent in session  with RN due to NG tube tape coming undone and affixing NG tube to gown in hopes of decreased agitation. OT recommending HHOT at discharge, will continue to follow.     Vision Baseline Vision/History: 0 No visual deficits Ability to See in Adequate Light: 0 Adequate Patient Visual Report: No change from baseline Vision Assessment?: No apparent visual deficits     Perception Perception: Not tested       Praxis Praxis: Not tested       Pertinent Vitals/Pain Pain Assessment Pain Assessment: Faces Faces Pain Scale: Hurts little more Pain Location: mid abdomen surgical site Pain Descriptors / Indicators: Discomfort, Operative site guarding Pain Intervention(s): Limited activity within patient's tolerance, Monitored during session, Repositioned     Extremity/Trunk Assessment Upper Extremity Assessment Upper Extremity Assessment: Overall WFL for tasks assessed;Right hand dominant   Lower Extremity Assessment Lower Extremity Assessment: Defer to PT evaluation   Cervical / Trunk Assessment Cervical / Trunk Assessment: Other exceptions Cervical / Trunk Exceptions: abdominal surgery   Communication Communication Communication: Impaired Factors Affecting Communication: Hearing impaired   Cognition Arousal: Alert Behavior During Therapy: WFL for tasks assessed/performed Cognition: No apparent impairments                               Following commands: Intact       Cueing  General Comments   Cueing Techniques: Verbal cues  VSS on 2L and removed in session and placed on RA at 94%   Exercises     Shoulder Instructions      Home Living Family/patient expects to be discharged to:: Private residence Living Arrangements: Other (Comment) (caregiver/roomate) Available Help at Discharge: Available PRN/intermittently Type of Home: Apartment Home Access: Stairs to enter Secretary/administrator of Steps: 3   Home Layout: One level     Bathroom Shower/Tub:  Chief Strategy Officer: Standard     Home Equipment: Agricultural Consultant (2 wheels);Cane - single point;Wheelchair - Careers Adviser (comment);Electric scooter;Shower seat   Additional Comments: knee scooter      Prior Functioning/Environment               Mobility Comments: just got new prosthetic since amputation revision prior to admission, using wheelchair with assist up steps when not using prosthetic ADLs Comments: roomate does cooking and cleaning mostly    OT Problem List: Decreased strength;Decreased activity tolerance;Impaired balance (sitting and/or standing);Pain   OT Treatment/Interventions: Self-care/ADL training;Therapeutic exercise;Energy conservation;DME and/or AE instruction;Manual therapy;Therapeutic activities;Patient/family education;Balance training      OT Goals(Current goals can be found in the care plan section)   Acute Rehab OT Goals Patient Stated Goal: to get better OT Goal Formulation: With patient Time For Goal Achievement: 02/18/24 Potential to Achieve Goals: Good ADL Goals Pt Will Perform Lower Body Bathing: with modified independence;sit to/from stand;sitting/lateral leans Pt Will Perform Lower Body Dressing: with modified independence;sitting/lateral leans;sit to/from stand Pt Will Transfer to Toilet: with modified independence;ambulating;regular height toilet Pt Will Perform Toileting - Clothing Manipulation and hygiene: with modified independence;sitting/lateral leans;sit to/from stand Additional ADL Goal #1: Patient  will be able to don prosthetic despite abdominal pain and incision to be able to engage in ADLs and IADLs.   OT Frequency:  Min 2X/week    Co-evaluation              AM-PAC OT 6 Clicks Daily Activity     Outcome Measure Help from another person eating meals?: Total (NPO) Help from another person taking care of personal grooming?: A Little Help from another person toileting, which includes using toliet,  bedpan, or urinal?: A Little Help from another person bathing (including washing, rinsing, drying)?: A Little Help from another person to put on and taking off regular upper body clothing?: A Little Help from another person to put on and taking off regular lower body clothing?: A Little 6 Click Score: 16   End of Session Nurse Communication: Mobility status  Activity Tolerance: Patient limited by pain Patient left: in bed;with call bell/phone within reach (sitting EOB)  OT Visit Diagnosis: Unsteadiness on feet (R26.81);Other abnormalities of gait and mobility (R26.89);Muscle weakness (generalized) (M62.81);Pain Pain - part of body:  (Abdomen)                Time: 8840-8775 OT Time Calculation (min): 25 min Charges:  OT General Charges $OT Visit: 1 Visit OT Evaluation $OT Eval Moderate Complexity: 1 Mod OT Treatments $Self Care/Home Management : 8-22 mins  Ronal Gift E. Immanuel Fedak, OTR/L Acute Rehabilitation Services [phone]   Calen Geister 02/04/2024, 2:50 PM

## 2024-02-04 NOTE — Progress Notes (Signed)
 2 Days Post-Op   Subjective/Chief Complaint: No flatus yet, pain controlled   Objective: Vital signs in last 24 hours: Temp:  [97.6 F (36.4 C)-98.5 F (36.9 C)] 98 F (36.7 C) (01/10 0758) Pulse Rate:  [68-80] 78 (01/10 0758) Resp:  [16-19] 16 (01/10 0758) BP: (108-123)/(62-77) 114/71 (01/10 0758) SpO2:  [89 %-98 %] 94 % (01/10 0758) Last BM Date : 01/31/24  Intake/Output from previous day: 01/09 0701 - 01/10 0700 In: 1233.3 [I.V.:1233.3] Out: 2440 [Urine:1540; Emesis/NG output:900] Intake/Output this shift: No intake/output data recorded.  General nad Ab soft approp tender nondistended dressing dry  Lab Results:  Recent Labs    02/03/24 0548 02/04/24 0524  WBC 10.5 11.1*  HGB 14.4 13.1  HCT 43.2 40.2  PLT 256 273   BMET Recent Labs    02/02/24 0456 02/03/24 0548  NA 137 140  K 3.5 3.6  CL 99 103  CO2 27 30  GLUCOSE 95 115*  BUN 13 10  CREATININE 0.71 0.58*  CALCIUM  9.3 8.7*   PT/INR Recent Labs    02/02/24 0437 02/02/24 0650  LABPROT 40.4* 15.4*  INR 4.0* 1.2   ABG No results for input(s): PHART, HCO3 in the last 72 hours.  Invalid input(s): PCO2, PO2  Studies/Results: No results found.  Anti-infectives: Anti-infectives (From admission, onward)    Start     Dose/Rate Route Frequency Ordered Stop   02/02/24 0630  ceFAZolin  (ANCEF ) IVPB 3g/150 mL premix        3 g 300 mL/hr over 30 Minutes Intravenous On call to O.R. 02/02/24 [id number] 02/02/24 0935       Assessment/Plan: POD 2 S/P exploratory laparotomy for sbo by Dr. Rubin on 1/8 -await ileus to resolve -continue ngt, npo for now -needs to be oob -can dc foley from my standpoint   FEN: NPO/NGT; IVF per primary team VTE: SCDs; heparin  ID: None currently     Donnice Bury 02/04/2024

## 2024-02-05 DIAGNOSIS — I824Y9 Acute embolism and thrombosis of unspecified deep veins of unspecified proximal lower extremity: Secondary | ICD-10-CM | POA: Diagnosis not present

## 2024-02-05 DIAGNOSIS — K56609 Unspecified intestinal obstruction, unspecified as to partial versus complete obstruction: Secondary | ICD-10-CM | POA: Diagnosis not present

## 2024-02-05 DIAGNOSIS — D6851 Activated protein C resistance: Secondary | ICD-10-CM | POA: Diagnosis not present

## 2024-02-05 DIAGNOSIS — E669 Obesity, unspecified: Secondary | ICD-10-CM | POA: Diagnosis not present

## 2024-02-05 LAB — BASIC METABOLIC PANEL WITH GFR
Anion gap: 12 (ref 5–15)
BUN: 12 mg/dL (ref 8–23)
CO2: 30 mmol/L (ref 22–32)
Calcium: 9.2 mg/dL (ref 8.9–10.3)
Chloride: 99 mmol/L (ref 98–111)
Creatinine, Ser: 0.64 mg/dL (ref 0.61–1.24)
GFR, Estimated: >60 mL/min
Glucose, Bld: 111 mg/dL — ABNORMAL HIGH (ref 70–99)
Potassium: 3.3 mmol/L — ABNORMAL LOW (ref 3.5–5.1)
Sodium: 141 mmol/L (ref 135–145)

## 2024-02-05 LAB — CBC
HCT: 44.3 % (ref 39.0–52.0)
Hemoglobin: 14.6 g/dL (ref 13.0–17.0)
MCH: 30.5 pg (ref 26.0–34.0)
MCHC: 33 g/dL (ref 30.0–36.0)
MCV: 92.5 fL (ref 80.0–100.0)
Platelets: 351 K/uL (ref 150–400)
RBC: 4.79 MIL/uL (ref 4.22–5.81)
RDW: 13.3 % (ref 11.5–15.5)
WBC: 10.5 K/uL (ref 4.0–10.5)
nRBC: 0 % (ref 0.0–0.2)

## 2024-02-05 LAB — MAGNESIUM: Magnesium: 2.2 mg/dL (ref 1.7–2.4)

## 2024-02-05 LAB — HEPARIN LEVEL (UNFRACTIONATED): Heparin Unfractionated: 0.39 [IU]/mL (ref 0.30–0.70)

## 2024-02-05 MED ORDER — OXYCODONE HCL 5 MG PO TABS
5.0000 mg | ORAL_TABLET | ORAL | Status: DC | PRN
  Administered 2024-02-05: 5 mg via ORAL
  Filled 2024-02-05: qty 1

## 2024-02-05 MED ORDER — MELATONIN 5 MG PO TABS
5.0000 mg | ORAL_TABLET | Freq: Every evening | ORAL | Status: DC | PRN
  Administered 2024-02-05: 5 mg via ORAL
  Filled 2024-02-05: qty 1

## 2024-02-05 MED ORDER — METHOCARBAMOL 500 MG PO TABS: 500.0000 mg | ORAL_TABLET | Freq: Three times a day (TID) | ORAL | Status: DC | PRN

## 2024-02-05 MED ORDER — POTASSIUM CHLORIDE 10 MEQ/100ML IV SOLN
10.0000 meq | INTRAVENOUS | Status: AC
  Administered 2024-02-05 (×2): 10 meq via INTRAVENOUS
  Filled 2024-02-05: qty 100

## 2024-02-05 MED ORDER — ACETAMINOPHEN 325 MG PO TABS
650.0000 mg | ORAL_TABLET | Freq: Four times a day (QID) | ORAL | Status: DC
  Administered 2024-02-05 – 2024-02-08 (×11): 650 mg via ORAL
  Filled 2024-02-05 (×11): qty 2

## 2024-02-05 NOTE — Progress Notes (Signed)
 "        [name]                                                                               [name], is a 64 y.o. male, DOB - 1960/02/01, FMW:[id number] Admit date - 02/01/2024    Outpatient Primary MD for the patient is [name], MD  LOS - 3  days    Brief summary   [name] is a 64 y.o. male with medical history significant for Hirschsprung's disease, had partial colectomy as an infant, factor V Leiden, left lower extremity DVT and right clavicle DVT on Coumadin , history of MRSA infection of left hip, left below the knee amputation, obesity, OSA, recently diagnosed SBO at [facility], was hospitalized for a week and was discharged on Friday 01/27/24.  Endorses severe intermittent abdominal pain, associated with nausea and vomiting, and watery stools.  Due to concern for internal hernia or volvulus, general surgery consulted and he underwent exp lap on 1/8.   Assessment & Plan    Assessment and Plan:   SBO S/p exploratory laparotomy by Dr Rubin on 1/8. Pain control. Mobilize.  Patient having bowel movements and flatulence. NGT removed Clears as tolerated.  Ambulate as tolerated.  Keep k greater than 4 and magnesium  greater than 2.    Hypokalemia Replaced.  Repeat levels in am.      factor V Leiden, left lower extremity DVT and right clavicle DVT  Patient on warfarin, at home, which is on hold. Gen surgery  okay with starting  heparin  after 24 hours. Heparin  consult with pharmacy placed without bolus as hemoglobin remains stable.  Hemoglobin remains stable.    Body mass index is 39.89 kg/m (pended). Obesity:    BPH Resume flomax .    Depression:  Resume prozac .     Estimated body mass index is 39.89 kg/m (pended) as calculated from the following:   Height as of this encounter: (P) 5' 11 (1.803 m).   Weight as of this encounter: (P) 129.7 kg.  Code Status: full code.  DVT Prophylaxis:  SCD's Start: 02/02/24 1539   Level of  Care: Level of care: Med-Surg Family Communication: none at bedside.   Disposition Plan:     Remains inpatient appropriate:  pending clinical improvement.   Procedures:  OR today.   Consultants:   Gen surgery.   Antimicrobials:   Anti-infectives (From admission, onward)    Start     Dose/Rate Route Frequency Ordered Stop   02/02/24 0630  ceFAZolin  (ANCEF ) IVPB 3g/150 mL premix        3 g 300 mL/hr over 30 Minutes Intravenous On call to O.R. 02/02/24 0625 02/02/24 0935        Medications  Scheduled Meds:  acetaminophen   650 mg Oral Q6H   atorvastatin   20 mg Oral Daily   busPIRone   15 mg Oral TID   cariprazine   3 mg Oral q AM   FLUoxetine   40 mg Oral Daily   gabapentin   200 mg Oral TID   rOPINIRole   0.5 mg Oral QHS   tamsulosin   0.4 mg Oral QHS   temazepam   30 mg Oral QHS  traZODone   200 mg Oral QHS   Continuous Infusions:  heparin  2,200 Units/hr (02/05/24 1257)   PRN Meds:.HYDROmorphone  (DILAUDID ) injection, melatonin, methocarbamol , ondansetron  **OR** ondansetron  (ZOFRAN ) IV, oxyCODONE     Subjective:   [name] was seen and examined today.  Having flatulence.   Objective:   Vitals:   02/04/24 2008 02/05/24 0447 02/05/24 0731 02/05/24 1340  BP: 121/69 127/80 117/72 106/69  Pulse: 79 92 89 85  Resp: 19 16    Temp: 98.7 F (37.1 C) 98.2 F (36.8 C) 99.1 F (37.3 C) 98.8 F (37.1 C)  TempSrc: Oral Oral Oral Oral  SpO2: (!) 89% 92% 90% 94%  Weight:      Height:        Intake/Output Summary (Last 24 hours) at 02/05/2024 1453 Last data filed at 02/05/2024 0619 Gross per 24 hour  Intake --  Output 3100 ml  Net -3100 ml   Filed Weights   02/01/24 1331 02/02/24 0756  Weight: 129.7 kg (P) 129.7 kg   General exam: Appears calm and comfortable  Respiratory system: Clear to auscultation. Respiratory effort normal. Cardiovascular system: S1 & S2 heard, RRR. No JVD, Gastrointestinal system: Abdomen is soft bs+, mildly tender at the incision site.   Central nervous system: Alert and oriented. No focal neurological deficits. Extremities: Symmetric 5 x 5 power. Skin: No rashes, lesions or ulcers Psychiatry:  Mood & affect appropriate.      Data Reviewed:  I have personally reviewed following labs and imaging studies   CBC Lab Results  Component Value Date   WBC 10.5 02/05/2024   RBC 4.79 02/05/2024   HGB 14.6 02/05/2024   HCT 44.3 02/05/2024   MCV 92.5 02/05/2024   MCH 30.5 02/05/2024   PLT 351 02/05/2024   MCHC 33.0 02/05/2024   RDW 13.3 02/05/2024   LYMPHSABS 1.6 02/01/2024   MONOABS 0.9 02/01/2024   EOSABS 0.0 02/01/2024   BASOSABS 0.0 02/01/2024     Last metabolic panel Lab Results  Component Value Date   NA 141 02/05/2024   K 3.3 (L) 02/05/2024   CL 99 02/05/2024   CO2 30 02/05/2024   BUN 12 02/05/2024   CREATININE 0.64 02/05/2024   GLUCOSE 111 (H) 02/05/2024   GFRNONAA >60 02/05/2024   GFRAA >60 03/21/2018   CALCIUM  9.2 02/05/2024   PROT 7.0 02/02/2024   ALBUMIN  3.7 02/02/2024   BILITOT 0.5 02/02/2024   ALKPHOS 74 02/02/2024   AST 108 (H) 02/02/2024   ALT 50 (H) 02/02/2024   ANIONGAP 12 02/05/2024    CBG (last 3)  No results for input(s): GLUCAP in the last 72 hours.    Coagulation Profile: Recent Labs  Lab 02/02/24 0437 02/02/24 0650  INR 4.0* 1.2     Radiology Studies: No results found.      Elgie Butter M.D. [name] 02/05/2024, 2:53 PM  Available via Epic secure chat 7am-7pm After 7 pm, please refer to night coverage provider listed on amion.    "

## 2024-02-05 NOTE — Progress Notes (Signed)
 PHARMACY - ANTICOAGULATION CONSULT NOTE  Pharmacy Consult for Heparin  Infusion Indication: factor V Leiden, left lower extremity DVT and right clavicle DVT   Allergies[1]  Patient Measurements: Height: (P) 5' 11 (180.3 cm) Weight: (P) 129.7 kg (286 lb) IBW/kg (Calculated) : (P) 75.3 HEPARIN  DW (KG): (P) 104.8  Vital Signs: Temp: 99.1 F (37.3 C) (01/11 0731) Temp Source: Oral (01/11 0731) BP: 117/72 (01/11 0731) Pulse Rate: 89 (01/11 0731)  Labs: Recent Labs    02/03/24 0548 02/03/24 0548 02/04/24 0524 02/04/24 1435 02/04/24 2243 02/05/24 0707  HGB 14.4  --  13.1  --   --  14.6  HCT 43.2  --  40.2  --   --  44.3  PLT 256  --  273  --   --  351  HEPARINUNFRC  --    < > <0.10* 0.18* 0.37 0.39  CREATININE 0.58*  --   --   --   --  0.64   < > = values in this interval not displayed.    Estimated Creatinine Clearance: 131.5 mL/min (by C-G formula based on SCr of 0.64 mg/dL).  Assessment: 69 YOM s/p ex-lap on 1/8. Patient has factor V leiden and hx DVT, on warfarin which was reversed on 1/8 with vitamin K  and 4FPCC. Heparin  infusion consult ordered, no bolus.  Heparin  level is therapeutic at 0.39 on 2200 units/hr. No bleeding noted, CBC stable.   Goal of Therapy:  Heparin  level 0.3-0.5 units/ml Monitor platelets by anticoagulation protocol: Yes   Plan:  Continue heparin  infusion at 2200 units/hr Daily heparin  level and CBC Monitor for signs/symptoms of bleeding Follow-up ability to resume warfarin  Thank you for involving pharmacy in this patient's care.  Delon Sax, PharmD, BCPS Clinical Pharmacist Clinical phone for 02/05/2024 is [phone] 02/05/2024 8:55 AM          [1]  Allergies Allergen Reactions   Nsaids Other (See Comments)    Factor V Leiden

## 2024-02-05 NOTE — Plan of Care (Signed)
" °  Problem: Bowel/Gastric: Goal: Gastrointestinal status for postoperative course will improve Outcome: Progressing   Problem: Cardiac: Goal: Ability to maintain an adequate cardiac output Outcome: Progressing Goal: Will show no evidence of cardiac arrhythmias Outcome: Progressing   Problem: Nutritional: Goal: Will attain and maintain optimal nutritional status Outcome: Progressing   "

## 2024-02-05 NOTE — Progress Notes (Signed)
 3 Days Post-Op   Subjective/Chief Complaint: Having flatus and bm, ng mostly ice chips coming back as he has been given alot   Objective: Vital signs in last 24 hours: Temp:  [98.2 F (36.8 C)-99.1 F (37.3 C)] 99.1 F (37.3 C) (01/11 0731) Pulse Rate:  [76-92] 89 (01/11 0731) Resp:  [16-19] 16 (01/11 0447) BP: (110-127)/(58-80) 117/72 (01/11 0731) SpO2:  [89 %-93 %] 90 % (01/11 0731) Last BM Date : 01/31/24  Intake/Output from previous day: 01/10 0701 - 01/11 0700 In: -  Out: 4800 [Urine:950; Emesis/NG output:3850] Intake/Output this shift: No intake/output data recorded.  General nad Ab soft approp tender nondistended dressing dry    Lab Results:  Recent Labs    02/04/24 0524 02/05/24 0707  WBC 11.1* 10.5  HGB 13.1 14.6  HCT 40.2 44.3  PLT 273 351   BMET Recent Labs    02/03/24 0548 02/05/24 0707  NA 140 141  K 3.6 3.3*  CL 103 99  CO2 30 30  GLUCOSE 115* 111*  BUN 10 12  CREATININE 0.58* 0.64  CALCIUM  8.7* 9.2   PT/INR No results for input(s): LABPROT, INR in the last 72 hours. ABG No results for input(s): PHART, HCO3 in the last 72 hours.  Invalid input(s): PCO2, PO2  Studies/Results: No results found.  Anti-infectives: Anti-infectives (From admission, onward)    Start     Dose/Rate Route Frequency Ordered Stop   02/02/24 0630  ceFAZolin  (ANCEF ) IVPB 3g/150 mL premix        3 g 300 mL/hr over 30 Minutes Intravenous On call to O.R. 02/02/24 [id number] 02/02/24 0935       Assessment/Plan: POD 3 S/P exploratory laparotomy for sbo by Dr. Rubin on 1/8 -having bowel function, ng output high but this is ice chips/nonbilious, will dc today and let him have clears -needs to be oob most of day FEN: clears; IVF per primary team VTE: SCDs; heparin  ID: None currently    [name] 02/05/2024

## 2024-02-06 LAB — HEPARIN LEVEL (UNFRACTIONATED): Heparin Unfractionated: 0.46 [IU]/mL (ref 0.30–0.70)

## 2024-02-06 LAB — CBC WITH DIFFERENTIAL/PLATELET
Abs Immature Granulocytes: 0.04 K/uL (ref 0.00–0.07)
Basophils Absolute: 0 K/uL (ref 0.0–0.1)
Basophils Relative: 0 %
Eosinophils Absolute: 0.2 K/uL (ref 0.0–0.5)
Eosinophils Relative: 3 %
HCT: 45.3 % (ref 39.0–52.0)
Hemoglobin: 14.8 g/dL (ref 13.0–17.0)
Immature Granulocytes: 1 %
Lymphocytes Relative: 20 %
Lymphs Abs: 1.5 K/uL (ref 0.7–4.0)
MCH: 30.2 pg (ref 26.0–34.0)
MCHC: 32.7 g/dL (ref 30.0–36.0)
MCV: 92.4 fL (ref 80.0–100.0)
Monocytes Absolute: 0.7 K/uL (ref 0.1–1.0)
Monocytes Relative: 10 %
Neutro Abs: 4.8 K/uL (ref 1.7–7.7)
Neutrophils Relative %: 66 %
Platelets: 362 K/uL (ref 150–400)
RBC: 4.9 MIL/uL (ref 4.22–5.81)
RDW: 13.4 % (ref 11.5–15.5)
WBC: 7.3 K/uL (ref 4.0–10.5)
nRBC: 0 % (ref 0.0–0.2)

## 2024-02-06 LAB — BASIC METABOLIC PANEL WITH GFR
Anion gap: 13 (ref 5–15)
BUN: 16 mg/dL (ref 8–23)
CO2: 29 mmol/L (ref 22–32)
Calcium: 9.1 mg/dL (ref 8.9–10.3)
Chloride: 97 mmol/L — ABNORMAL LOW (ref 98–111)
Creatinine, Ser: 0.82 mg/dL (ref 0.61–1.24)
GFR, Estimated: >60 mL/min
Glucose, Bld: 109 mg/dL — ABNORMAL HIGH (ref 70–99)
Potassium: 3 mmol/L — ABNORMAL LOW (ref 3.5–5.1)
Sodium: 139 mmol/L (ref 135–145)

## 2024-02-06 LAB — MAGNESIUM: Magnesium: 2 mg/dL (ref 1.7–2.4)

## 2024-02-06 MED ORDER — POTASSIUM CHLORIDE 20 MEQ PO PACK
40.0000 meq | PACK | Freq: Once | ORAL | Status: AC
  Administered 2024-02-06: 40 meq via ORAL
  Filled 2024-02-06: qty 2

## 2024-02-06 MED ORDER — BOOST / RESOURCE BREEZE PO LIQD CUSTOM
1.0000 | Freq: Three times a day (TID) | ORAL | Status: DC
  Administered 2024-02-06: 1 via ORAL

## 2024-02-06 NOTE — Plan of Care (Signed)
" °  Problem: Education: Goal: Knowledge of the prescribed therapeutic regimen will improve Outcome: Progressing   Problem: Nutritional: Goal: Will attain and maintain optimal nutritional status Outcome: Progressing   "

## 2024-02-06 NOTE — Care Management Important Message (Signed)
 Important Message  Patient Details  Name: [name] MRN: [id number] Date of Birth: [birth year]   Important Message Given:  Yes - Medicare IM     Jennie Laneta Dragon 02/06/2024, 1:04 PM

## 2024-02-06 NOTE — Progress Notes (Signed)
 PHARMACY - ANTICOAGULATION CONSULT NOTE  Pharmacy Consult for Heparin  Infusion Indication: factor V Leiden, left lower extremity DVT and right clavicle DVT   Allergies[1]  Patient Measurements: Height: (P) 5' 11 (180.3 cm) Weight: (P) 129.7 kg (286 lb) IBW/kg (Calculated) : (P) 75.3 HEPARIN  DW (KG): (P) 104.8  Vital Signs: Temp: 97.4 F (36.3 C) (01/12 0900) Temp Source: Oral (01/12 0900) BP: 109/64 (01/12 0900) Pulse Rate: 78 (01/12 0900)  Labs: Recent Labs    02/04/24 0524 02/04/24 1435 02/04/24 2243 02/05/24 0707 02/06/24 0652  HGB 13.1  --   --  14.6 14.8  HCT 40.2  --   --  44.3 45.3  PLT 273  --   --  351 362  HEPARINUNFRC <0.10*   < > 0.37 0.39 0.46  CREATININE  --   --   --  0.64 0.82   < > = values in this interval not displayed.    Estimated Creatinine Clearance: 128.3 mL/min (by C-G formula based on SCr of 0.82 mg/dL).  Assessment: 62 YOM s/p ex-lap on 1/08. Patient has factor V leiden and hx DVT, on warfarin which was reversed on 1/08 with vitamin K  and 4FPCC. Heparin  infusion consult ordered, no bolus.  Heparin  level is therapeutic x2 on 2200 units/hr. No bleeding noted, CBC stable.   Goal of Therapy:  Heparin  level 0.3-0.5 units/ml Monitor platelets by anticoagulation protocol: Yes   Plan:  Continue heparin  infusion at 2200 units/hr Check heparin  level daily while on heparin  Continue to monitor H&H and platelets Follow-up ability to resume warfarin  Thank you for allowing pharmacy to be a part of this patients care.  Shelba Collier, PharmD, BCPS Clinical Pharmacist    [1]  Allergies Allergen Reactions   Nsaids Other (See Comments)    Factor V Leiden

## 2024-02-06 NOTE — Progress Notes (Signed)
 Occupational Therapy Treatment Patient Details Name: [name] MRN: [id number] DOB: 04-Nov-1960 Today's Date: 02/06/2024   History of present illness Patient is a 64 y/o male admitted 02/01/24 due to abdominal pain/N/V.  Recent stay at [facility] x 1 week for SBO, d/c 01/27/24.  Concern for internal hernia vs. Volvulus and taken to surgery for ex lap 02/02/24.  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   OT comments  Pt c/o fatigue, abdominal pain, states he has had trouble donning his new prosthetic. Pt able to complete bed mobility with HOB elevated, using bed rails without physical assist. Pt set up for donning prosthetic, able to click in place sitting EOB. Pt stands with CGA from EOB, RW for support. Pt able to ambulate 20 feet with increased effort, SOB throughout ambulation, Pt reports feeling weak and tired as he has not eaten much in days. Pt asked to return to bed to rest, will continue to see Pt acutely to progress as able, HHOT follow up still recommended.       If plan is discharge home, recommend the following:  A little help with walking and/or transfers;A little help with bathing/dressing/bathroom;Assistance with cooking/housework;Assist for transportation;Help with stairs or ramp for entrance   Equipment Recommendations  None recommended by OT    Recommendations for Other Services      Precautions / Restrictions Precautions Precautions: Fall Recall of Precautions/Restrictions: Intact Precaution/Restrictions Comments: NGT to intermittent suction, abdominal binder, L BKA, prosthetic in the room Restrictions Weight Bearing Restrictions Per Provider Order: No       Mobility Bed Mobility Overal bed mobility: Needs Assistance Bed Mobility: Supine to Sit, Sit to Supine     Supine to sit: Contact guard Sit  to supine: Contact guard assist   General bed mobility comments: CGA    Transfers Overall transfer level: Needs assistance Equipment used: Rolling walker (2 wheels) Transfers: Sit to/from Stand Sit to Stand: Contact guard assist           General transfer comment: CGA with RW, able to don prosthetic, does well with it     Balance Overall balance assessment: Needs assistance Sitting-balance support: No upper extremity supported, Feet supported Sitting balance-Leahy Scale: Good     Standing balance support: Bilateral upper extremity supported, During functional activity Standing balance-Leahy Scale: Poor Standing balance comment: reliant on RW for support, able to don prosthetic                           ADL either performed or assessed with clinical judgement   ADL Overall ADL's : Needs assistance/impaired                         Toilet Transfer: Contact guard assist;Rolling walker (2 wheels)           Functional mobility during ADLs: Contact guard assist;Rolling walker (2 wheels) General ADL Comments: CGA for transfer and ambulation with RW, able to don prosthetic with set up.    Extremity/Trunk Assessment Upper Extremity Assessment Upper Extremity Assessment: Overall WFL for tasks assessed            Vision       Perception     Praxis     Communication Communication Communication: Impaired Factors Affecting Communication: Hearing  impaired   Cognition Arousal: Alert Behavior During Therapy: WFL for tasks assessed/performed Cognition: No apparent impairments                               Following commands: Intact        Cueing   Cueing Techniques: Verbal cues  Exercises      Shoulder Instructions       General Comments      Pertinent Vitals/ Pain       Pain Assessment Pain Assessment: Faces Faces Pain Scale: Hurts little more Pain Location: mid abdomen surgical site Pain Descriptors / Indicators:  Discomfort, Operative site guarding Pain Intervention(s): Monitored during session  Home Living                                          Prior Functioning/Environment              Frequency  Min 2X/week        Progress Toward Goals  OT Goals(current goals can now be found in the care plan section)  Progress towards OT goals: Progressing toward goals  Acute Rehab OT Goals Patient Stated Goal: to improve strength and activity tolerance OT Goal Formulation: With patient Time For Goal Achievement: 02/18/24 Potential to Achieve Goals: Good ADL Goals Pt Will Perform Lower Body Bathing: with modified independence;sit to/from stand;sitting/lateral leans Pt Will Perform Lower Body Dressing: with modified independence;sitting/lateral leans;sit to/from stand Pt Will Transfer to Toilet: with modified independence;ambulating;regular height toilet Pt Will Perform Toileting - Clothing Manipulation and hygiene: with modified independence;sitting/lateral leans;sit to/from stand Additional ADL Goal #1: Patient will be able to don prosthetic despite abdominal pain and incision to be able to engage in ADLs and IADLs.  Plan      Co-evaluation                 AM-PAC OT 6 Clicks Daily Activity     Outcome Measure   Help from another person eating Woods?: Total Help from another person taking care of personal grooming?: A Little Help from another person toileting, which includes using toliet, bedpan, or urinal?: A Little Help from another person bathing (including washing, rinsing, drying)?: A Little Help from another person to put on and taking off regular upper body clothing?: A Little Help from another person to put on and taking off regular lower body clothing?: A Little 6 Click Score: 16    End of Session Equipment Utilized During Treatment: Gait belt;Rolling walker (2 wheels)  OT Visit Diagnosis: Unsteadiness on feet (R26.81);Other abnormalities of gait  and mobility (R26.89);Muscle weakness (generalized) (M62.81);Pain   Activity Tolerance Patient limited by fatigue   Patient Left in bed;with call bell/phone within reach   Nurse Communication Mobility status        Time: 8579-8567 OT Time Calculation (min): 12 min  Charges: OT General Charges $OT Visit: 1 Visit OT Treatments $Therapeutic Activity: 8-22 mins  Center, OTR/L   Elouise JONELLE Bott 02/06/2024, 2:50 PM

## 2024-02-06 NOTE — Progress Notes (Signed)
" ° °  Progress Note  4 Days Post-Op  Subjective: Patient reports tolerating CLD. Having bowel function and flatulence. Pain is minimal and tolerable. Denies n/v.   ROS  All negative with the exception of above.  Objective: Vital signs in last 24 hours: Temp:  [97.7 F (36.5 C)-98.8 F (37.1 C)] 97.7 F (36.5 C) (01/12 0318) Pulse Rate:  [72-85] 72 (01/12 0318) Resp:  [17-18] 17 (01/12 0318) BP: (106-120)/(60-70) 117/60 (01/12 0318) SpO2:  [90 %-96 %] 96 % (01/12 0318) Last BM Date : 02/05/24  Intake/Output from previous day: No intake/output data recorded. Intake/Output this shift: No intake/output data recorded.  PE: General: Pleasant male who is laying in bed in NAD. HEENT: Head is normocephalic, atraumatic. Heart: HR normal during encounter. Lungs: Respiratory effort nonlabored. Abd: Soft, ND. Appropriate generalized tenderness to palpation around wound. Midline incision with staples covered with honeycomb dressing. No active bleeding or discharge. No rebound tenderness or guarding.  Skin: Warm and dry.  Psych: A&Ox3 with an appropriate affect.    Lab Results:  Recent Labs    02/05/24 0707 02/06/24 0652  WBC 10.5 7.3  HGB 14.6 14.8  HCT 44.3 45.3  PLT 351 362   BMET Recent Labs    02/05/24 0707 02/06/24 0652  NA 141 139  K 3.3* 3.0*  CL 99 97*  CO2 30 29  GLUCOSE 111* 109*  BUN 12 16  CREATININE 0.64 0.82  CALCIUM  9.2 9.1   PT/INR No results for input(s): LABPROT, INR in the last 72 hours. CMP     Component Value Date/Time   NA 139 02/06/2024 0652   K 3.0 (L) 02/06/2024 0652   CL 97 (L) 02/06/2024 0652   CO2 29 02/06/2024 0652   GLUCOSE 109 (H) 02/06/2024 0652   BUN 16 02/06/2024 0652   CREATININE 0.82 02/06/2024 0652   CALCIUM  9.1 02/06/2024 0652   PROT 7.0 02/02/2024 0456   ALBUMIN  3.7 02/02/2024 0456   AST 108 (H) 02/02/2024 0456   ALT 50 (H) 02/02/2024 0456   ALKPHOS 74 02/02/2024 0456   BILITOT 0.5 02/02/2024 0456   GFRNONAA >60  02/06/2024 0652   GFRAA >60 03/21/2018 0623   Lipase     Component Value Date/Time   LIPASE 15 02/01/2024 1353       Studies/Results: No results found.  Anti-infectives: Anti-infectives (From admission, onward)    Start     Dose/Rate Route Frequency Ordered Stop   02/02/24 0630  ceFAZolin  (ANCEF ) IVPB 3g/150 mL premix        3 g 300 mL/hr over 30 Minutes Intravenous On call to O.R. 02/02/24 0625 02/02/24 0935        Assessment/Plan POD4: S/P exploratory laparotomy by Dr. Rubin on 1/8 -Afebrile. -Labs pending for today. -Exam without significant concerns. -Pain manageable. Tolerating CLD. No n/v. Having BM and flatulence. -Will advance diet to FLD. -Will continue to follow.    FEN: FLD; IVF per primary team VTE: SCDs; Heparin  infusion ID: None currently   LOS: 4 days   I reviewed hospitalist notes, specialist notes, nursing notes, last 24 h vitals and pain scores, last 48 h intake and output, last 24 h labs and trends, and last 24 h imaging results.  Marjorie Carlyon Favre, [facility] 02/06/2024, 8:25 AM Please see Amion for pager number during day hours 7:00am-4:30pm  "

## 2024-02-06 NOTE — Progress Notes (Signed)
 "        [facility]                                                                               [name], is a 64 y.o. male, DOB - 05/06/1960, FMW:[id number] Admit date - 02/01/2024    Outpatient Primary MD for the patient is Erik Chow, MD  LOS - 4  days    Brief summary   [name] is a 64 y.o. male with medical history significant for Hirschsprung's disease, had partial colectomy as an infant, factor V Leiden, left lower extremity DVT and right clavicle DVT on Coumadin , history of MRSA infection of left hip, left below the knee amputation, obesity, OSA, recently diagnosed SBO at [facility], was hospitalized for a week and was discharged on Friday 01/27/24.  Endorses severe intermittent abdominal pain, associated with nausea and vomiting, and watery stools.  Due to concern for internal hernia or volvulus, general surgery consulted and he underwent exp lap on 1/8.   Assessment & Plan    Assessment and Plan:   SBO S/p exploratory laparotomy by Dr [name] on 1/8. Pain control. Mobilize.  Patient having bowel movements and flatulence. NGT removed Clears as tolerated. Advanced to fulls today.  Ambulate as tolerated.  Keep k greater than 4 and magnesium  greater than 2.    Hypokalemia Replaced.  Repeat levels in am.      factor V Leiden, left lower extremity DVT and right clavicle DVT  Patient on warfarin, at home, which is on hold. Gen surgery  okay with starting  heparin  after 24 hours. Heparin  consult with pharmacy placed without bolus as hemoglobin remains stable.  Hemoglobin remains stable.    Body mass index is 39.89 kg/m (pended). Obesity:    BPH Resume flomax .    Depression:  Resume prozac .     Estimated body mass index is 39.89 kg/m (pended) as calculated from the following:   Height as of this encounter: (P) 5' 11 (1.803 m).   Weight as of this encounter: (P) 129.7 kg.  Code Status: full code.  DVT Prophylaxis:  SCD's Start:  02/02/24 1539   Level of Care: Level of care: Med-Surg Family Communication: none at bedside.   Disposition Plan:     Remains inpatient appropriate:  pending clinical improvement.   Procedures:  OR today.   Consultants:   Gen surgery.   Antimicrobials:   Anti-infectives (From admission, onward)    Start     Dose/Rate Route Frequency Ordered Stop   02/02/24 0630  ceFAZolin  (ANCEF ) IVPB 3g/150 mL premix        3 g 300 mL/hr over 30 Minutes Intravenous On call to O.R. 02/02/24 0625 02/02/24 0935        Medications  Scheduled Meds:  acetaminophen   650 mg Oral Q6H   atorvastatin   20 mg Oral Daily   busPIRone   15 mg Oral TID   cariprazine   3 mg Oral q AM   feeding supplement  1 Container Oral TID BM   FLUoxetine   40 mg Oral Daily   gabapentin   200 mg Oral TID   rOPINIRole   0.5 mg Oral QHS   tamsulosin   0.4 mg Oral QHS   temazepam   30 mg Oral QHS   traZODone   200 mg Oral QHS   Continuous Infusions:  heparin  2,200 Units/hr (02/06/24 1205)   PRN Meds:.HYDROmorphone  (DILAUDID ) injection, melatonin, methocarbamol , ondansetron  **OR** ondansetron  (ZOFRAN ) IV, oxyCODONE     Subjective:   [name] was seen and examined today.  Having bowel movemens and flatulence.   Objective:   Vitals:   02/06/24 0318 02/06/24 0743 02/06/24 0900 02/06/24 1503  BP: 117/60 109/64 109/64 (!) 112/59  Pulse: 72 77 78 84  Resp: 17     Temp: 97.7 F (36.5 C) 98.4 F (36.9 C) (!) 97.4 F (36.3 C) 98.5 F (36.9 C)  TempSrc:  Oral Oral Oral  SpO2: 96% 95% 100% 92%  Weight:      Height:       No intake or output data in the 24 hours ending 02/06/24 1931  Filed Weights   02/01/24 1331 02/02/24 0756  Weight: 129.7 kg (P) 129.7 kg   General exam: Appears calm and comfortable  Respiratory system: Clear to auscultation. Respiratory effort normal. Cardiovascular system: S1 & S2 heard, RRR.  Gastrointestinal system: Abdomen is nondistended, soft and nontender.  Central nervous  system: Alert and oriented. No focal neurological deficits. Extremities: left BKA Skin: No rashes, lesions or ulcers Psychiatry: Mood & affect appropriate.       Data Reviewed:  I have personally reviewed following labs and imaging studies   CBC Lab Results  Component Value Date   WBC 7.3 02/06/2024   RBC 4.90 02/06/2024   HGB 14.8 02/06/2024   HCT 45.3 02/06/2024   MCV 92.4 02/06/2024   MCH 30.2 02/06/2024   PLT 362 02/06/2024   MCHC 32.7 02/06/2024   RDW 13.4 02/06/2024   LYMPHSABS 1.5 02/06/2024   MONOABS 0.7 02/06/2024   EOSABS 0.2 02/06/2024   BASOSABS 0.0 02/06/2024     Last metabolic panel Lab Results  Component Value Date   NA 139 02/06/2024   K 3.0 (L) 02/06/2024   CL 97 (L) 02/06/2024   CO2 29 02/06/2024   BUN 16 02/06/2024   CREATININE 0.82 02/06/2024   GLUCOSE 109 (H) 02/06/2024   GFRNONAA >60 02/06/2024   GFRAA >60 03/21/2018   CALCIUM  9.1 02/06/2024   PROT 7.0 02/02/2024   ALBUMIN  3.7 02/02/2024   BILITOT 0.5 02/02/2024   ALKPHOS 74 02/02/2024   AST 108 (H) 02/02/2024   ALT 50 (H) 02/02/2024   ANIONGAP 13 02/06/2024    CBG (last 3)  No results for input(s): GLUCAP in the last 72 hours.    Coagulation Profile: Recent Labs  Lab 02/02/24 0437 02/02/24 0650  INR 4.0* 1.2     Radiology Studies: No results found.      [name] M.D. [facility] 02/06/2024, 7:31 PM  Available via Epic secure chat 7am-7pm After 7 pm, please refer to night coverage provider listed on amion.    "

## 2024-02-06 NOTE — Progress Notes (Signed)
 Physical Therapy Treatment Patient Details Name: [name] MRN: [id number] DOB: Oct 29, 1960 Today's Date: 02/06/2024   History of Present Illness Patient is a 64 y/o male admitted 02/01/24 due to abdominal pain/N/V.  Recent stay at [facility] x 1 week for SBO, d/c 01/27/24.  Concern for internal hernia vs. Volvulus and taken to surgery for ex lap 02/02/24.  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    PT Comments  Pt received in supine, agreeable to therapy session after pt premedicated for pain, pt reports some fatigue after working with OT but with LLE prosthetic in place and pt agreeable to work on seated/standing exercises. Pt needing up to heavy minA for sit<>Stand from EOB<>RW and increased time to transition to standing from sitting, and CGA for seated LE exercises due to posterior lean/guarding. Pt performed standing foot taps on 7 step then stepping up/down 7 platform x2 reps to simulate home entry, prior to fatigue. Pt has 3 STE so would benefit from additional practice on gait/stairs with LLE prosthetic donned next session as he reports he recently rec'd prosthetic and LLE is a bit sore/unused to wearing it for prolonged ambulation. Pt continues to benefit from PT services to progress toward functional mobility goals.     If plan is discharge home, recommend the following: A little help with walking and/or transfers;A little help with bathing/dressing/bathroom;Help with stairs or ramp for entrance;Assistance with cooking/housework;Assist for transportation   Can travel by private vehicle        Equipment Recommendations  None recommended by PT    Recommendations for Other Services       Precautions / Restrictions Precautions Precautions: Fall Recall of Precautions/Restrictions: Intact Precaution/Restrictions  Comments: Abdominal binder, L BKA, new prosthetic in the room Restrictions Weight Bearing Restrictions Per Provider Order: No     Mobility  Bed Mobility Overal bed mobility: Needs Assistance Bed Mobility: Supine to Sit, Sit to Supine     Supine to sit: Supervision, Used rails, HOB elevated Sit to supine: Supervision, Used rails   General bed mobility comments: Heavy reliance on hospital bed features. Pt already has LLE prosthetic in place prior to transfer to EOB    Transfers Overall transfer level: Needs assistance Equipment used: Rolling walker (2 wheels) Transfers: Sit to/from Stand Sit to Stand: Min assist           General transfer comment: from lowest bed height with minA, increased time to rise to RW; sidesteps along EOB to simulate step pivot with CGA, pt defers gait after standing exercises and stair training due to fatigue.    Ambulation/Gait             Pre-gait activities: standing marches x10 reps at RW and sidesteps ~59ft toward [facility] prior to sitting.     Stairs Stairs: Yes Stairs assistance: Min assist Stair Management: Step to pattern, Forwards, With walker Number of Stairs: 2 General stair comments: 7 step in room x2 reps with RW and minA for steadying/safety. no buckling or LOB, but fatigued after 2 reps, requesting return to sit EOB.   Wheelchair Mobility     Tilt Bed    Modified Rankin (Stroke Patients Only)       Balance Overall balance assessment: Needs assistance Sitting-balance support: No upper extremity supported, Feet supported Sitting balance-Leahy Scale: Fair Sitting balance - Comments: posterior  lean with seated LE exercises Postural control: Posterior lean Standing balance support: Bilateral upper extremity supported, During functional activity Standing balance-Leahy Scale: Poor Standing balance comment: reliant on RW for support, able to don prosthetic                            Communication  Communication Communication: Impaired Factors Affecting Communication: Hearing impaired  Cognition Arousal: Alert Behavior During Therapy: WFL for tasks assessed/performed   PT - Cognitive impairments: No apparent impairments                         Following commands: Intact      Cueing Cueing Techniques: Verbal cues  Exercises Other Exercises Other Exercises: seated BLE AROM: LAQ x10 reps ea Other Exercises: standing BLE AROM: hip flexion x10 reps ea at [employer] Other Exercises: Foot taps x10 reps ea foot on 7 step for BLE strengthening Other Exercises: stepping up/down 7 platform x2 reps for strengthening    General Comments General comments (skin integrity, edema, etc.): VSS per chart review, no acute s/sx distress      Pertinent Vitals/Pain Pain Assessment Pain Assessment: Faces Faces Pain Scale: Hurts little more Pain Location: mid abdomen surgical site Pain Descriptors / Indicators: Discomfort, Operative site guarding, Grimacing Pain Intervention(s): Limited activity within patient's tolerance, Monitored during session, Premedicated before session, Repositioned    Home Living                          Prior Function            PT Goals (current goals can now be found in the care plan section) Acute Rehab PT Goals Patient Stated Goal: get stronger, eat then go home PT Goal Formulation: With patient Time For Goal Achievement: 02/17/24 Progress towards PT goals: Progressing toward goals    Frequency    Min 3X/week      PT Plan      Co-evaluation              AM-PAC PT 6 Clicks Mobility   Outcome Measure  Help needed turning from your back to your side while in a flat bed without using bedrails?: A Little Help needed moving from lying on your back to sitting on the side of a flat bed without using bedrails?: A Little Help needed moving to and from a bed to a chair (including a wheelchair)?: A Little Help needed standing up from  a chair using your arms (e.g., wheelchair or bedside chair)?: A Little Help needed to walk in hospital room?: Total (<74ft) Help needed climbing 3-5 steps with a railing? : Total 6 Click Score: 14    End of Session Equipment Utilized During Treatment: Gait belt Activity Tolerance: Patient tolerated treatment well Patient left: in bed;with call bell/phone within reach;with bed alarm set Nurse Communication: Mobility status PT Visit Diagnosis: Muscle weakness (generalized) (M62.81);Other abnormalities of gait and mobility (R26.89)     Time: 1735-1751 PT Time Calculation (min) (ACUTE ONLY): 16 min  Charges:    $Therapeutic Exercise: 8-22 mins PT General Charges $$ ACUTE PT VISIT: 1 Visit                     Yon Schiffman P., PTA Acute Rehabilitation Services Secure Chat Preferred 9a-5:30pm Office: [phone]    Connell HERO [facility] 02/06/2024, 6:00 PM

## 2024-02-06 NOTE — Plan of Care (Signed)
" °  Problem: Bowel/Gastric: Goal: Gastrointestinal status for postoperative course will improve Outcome: Progressing   Problem: Cardiac: Goal: Ability to maintain an adequate cardiac output Outcome: Progressing Goal: Will show no evidence of cardiac arrhythmias Outcome: Progressing   Problem: Neurological: Goal: Will regain or maintain usual level of consciousness Outcome: Adequate for Discharge   "

## 2024-02-07 LAB — CBC WITH DIFFERENTIAL/PLATELET
Abs Immature Granulocytes: 0.03 K/uL (ref 0.00–0.07)
Basophils Absolute: 0 K/uL (ref 0.0–0.1)
Basophils Relative: 0 %
Eosinophils Absolute: 0.2 K/uL (ref 0.0–0.5)
Eosinophils Relative: 2 %
HCT: 40.6 % (ref 39.0–52.0)
Hemoglobin: 13.6 g/dL (ref 13.0–17.0)
Immature Granulocytes: 1 %
Lymphocytes Relative: 20 %
Lymphs Abs: 1.3 K/uL (ref 0.7–4.0)
MCH: 30.5 pg (ref 26.0–34.0)
MCHC: 33.5 g/dL (ref 30.0–36.0)
MCV: 91 fL (ref 80.0–100.0)
Monocytes Absolute: 0.6 K/uL (ref 0.1–1.0)
Monocytes Relative: 10 %
Neutro Abs: 4.2 K/uL (ref 1.7–7.7)
Neutrophils Relative %: 67 %
Platelets: 369 K/uL (ref 150–400)
RBC: 4.46 MIL/uL (ref 4.22–5.81)
RDW: 13.5 % (ref 11.5–15.5)
WBC: 6.3 K/uL (ref 4.0–10.5)
nRBC: 0 % (ref 0.0–0.2)

## 2024-02-07 LAB — BASIC METABOLIC PANEL WITH GFR
Anion gap: 8 (ref 5–15)
BUN: 14 mg/dL (ref 8–23)
CO2: 30 mmol/L (ref 22–32)
Calcium: 9 mg/dL (ref 8.9–10.3)
Chloride: 99 mmol/L (ref 98–111)
Creatinine, Ser: 0.64 mg/dL (ref 0.61–1.24)
GFR, Estimated: >60 mL/min
Glucose, Bld: 108 mg/dL — ABNORMAL HIGH (ref 70–99)
Potassium: 3 mmol/L — ABNORMAL LOW (ref 3.5–5.1)
Sodium: 137 mmol/L (ref 135–145)

## 2024-02-07 LAB — HEPARIN LEVEL (UNFRACTIONATED): Heparin Unfractionated: 0.33 [IU]/mL (ref 0.30–0.70)

## 2024-02-07 MED ORDER — POTASSIUM CHLORIDE CRYS ER 20 MEQ PO TBCR
40.0000 meq | EXTENDED_RELEASE_TABLET | Freq: Three times a day (TID) | ORAL | Status: AC
  Administered 2024-02-07 (×2): 40 meq via ORAL
  Filled 2024-02-07 (×2): qty 2

## 2024-02-07 MED ORDER — POTASSIUM CHLORIDE CRYS ER 20 MEQ PO TBCR
40.0000 meq | EXTENDED_RELEASE_TABLET | Freq: Three times a day (TID) | ORAL | Status: DC
  Administered 2024-02-07: 40 meq via ORAL
  Filled 2024-02-07: qty 2

## 2024-02-07 MED ORDER — WARFARIN - PHARMACIST DOSING INPATIENT: Freq: Every day | Status: DC

## 2024-02-07 MED ORDER — WARFARIN SODIUM 7.5 MG PO TABS
7.5000 mg | ORAL_TABLET | Freq: Every day | ORAL | Status: DC
  Administered 2024-02-07: 7.5 mg via ORAL
  Filled 2024-02-07 (×2): qty 1

## 2024-02-07 NOTE — Plan of Care (Signed)
  Problem: Nutrition: Goal: Adequate nutrition will be maintained Outcome: Progressing   Problem: Elimination: Goal: Will not experience complications related to bowel motility Outcome: Progressing   Problem: Elimination: Goal: Will not experience complications related to urinary retention Outcome: Progressing   Problem: Pain Managment: Goal: General experience of comfort will improve and/or be controlled Outcome: Progressing   Problem: Safety: Goal: Ability to remain free from injury will improve Outcome: Progressing

## 2024-02-07 NOTE — Progress Notes (Signed)
 "        [facility]                                                                               [name], is a 64 y.o. male, DOB - [birth year], FMW:[id number] Admit date - 02/01/2024    Outpatient Primary MD for the patient is Erik Chow, MD  LOS - 5  days    Brief summary   [name] is a 64 y.o. 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  Endorses severe intermittent abdominal pain, associated with nausea and vomiting, and watery stools.  Due to concern for internal hernia or volvulus, general surgery consulted and he underwent exp lap on 1/8. Advancing diet as tolerated. Currently he is on soft diet, if able to tolerate in the next 24hours, should be ready for discharge in am.    Assessment & Plan    Assessment and Plan:   SBO S/p exploratory laparotomy by Dr Rubin on 1/8. Pain control. Mobilize.  Patient having bowel movements and flatulence. NGT removed Advancing diet as tolerated.  Ambulate as tolerated.  Keep k greater than 4 and magnesium  greater than 2.    Hypokalemia Replaced. Repeat levels in am. Magnesium  level wnl.      factor V Leiden, left lower extremity DVT and right clavicle DVT  Patient on warfarin, at home, which is on hold. Gen surgery  okay with starting  heparin  after 24 hours. Heparin  consult with pharmacy placed without bolus as hemoglobin remains stable.  Hemoglobin remains stable. Will restart the patient on warfarin once surgery is okay with it.    Body mass index is 39.89 kg/m (pended). Obesity:    BPH Resume flomax .    Depression:  Resume prozac .    Hyperlipidemia Resume statin.         Estimated body mass index is 39.89 kg/m (pended) as calculated from the following:   Height as of this encounter: (P) 5' 11 (1.803 m).   Weight as of this encounter: (P) 129.7 kg.  Code Status: full code.  DVT Prophylaxis:  SCD's Start: 02/02/24 1539   Level of Care: Level of care: Med-Surg Family Communication: none at bedside.   Disposition Plan:     Remains inpatient appropriate:  pending clinical improvement.   Procedures:  S/P exploratory laparotomy by Dr. Rubin on 1/8   Consultants:   Gen surgery.   Antimicrobials:   Anti-infectives (From admission, onward)    Start     Dose/Rate Route Frequency Ordered Stop   02/02/24 0630  ceFAZolin  (ANCEF ) IVPB 3g/150 mL premix        3 g 300 mL/hr over 30 Minutes Intravenous On call to O.R. 02/02/24 0625 02/02/24 0935        Medications  Scheduled Meds:  acetaminophen   650 mg Oral Q6H   atorvastatin   20 mg Oral Daily  busPIRone   15 mg Oral TID   cariprazine   3 mg Oral q AM   feeding supplement  1 Container Oral TID BM   FLUoxetine   40 mg Oral Daily   gabapentin   200 mg Oral TID   potassium chloride   40 mEq Oral TID   rOPINIRole   0.5 mg Oral QHS   tamsulosin   0.4 mg Oral QHS   temazepam   30 mg Oral QHS   traZODone   200 mg Oral QHS   Continuous Infusions:  heparin  2,200 Units/hr (02/07/24 0010)   PRN Meds:.HYDROmorphone  (DILAUDID ) injection, melatonin, methocarbamol , ondansetron  **OR** ondansetron  (ZOFRAN ) IV, oxyCODONE     Subjective:   [name] was seen and examined today.  Having bowel movemens and flatulence. No new complaints.   Objective:   Vitals:   02/06/24 1503 02/06/24 1949 02/07/24 0324 02/07/24 0740  BP: (!) 112/59 104/61 115/86 128/71  Pulse: 84 74 77 88  Resp:  17 16 20   Temp: 98.5 F (36.9 C) 98.2 F (36.8 C) 97.8 F (36.6 C) 98 F (36.7 C)  TempSrc: Oral Oral Oral   SpO2: 92% 92% 97% 97%  Weight:      Height:       No intake or output data in the 24 hours ending  02/07/24 1157  Filed Weights   02/01/24 1331 02/02/24 0756  Weight: 129.7 kg (P) 129.7 kg   General exam: Appears calm and comfortable  Respiratory system: Clear to auscultation. Respiratory effort normal. Cardiovascular system: S1 & S2 heard, RRR. No JVD Gastrointestinal system: Abdomen is nondistended, soft and nontender. Mid line incision looks clean. Central nervous system: Alert and oriented. No focal neurological deficits. Extremities: left BKA Skin: No rashes,  Psychiatry: Mood & affect appropriate.       Data Reviewed:  I have personally reviewed following labs and imaging studies   CBC Lab Results  Component Value Date   WBC 6.3 02/07/2024   RBC 4.46 02/07/2024   HGB 13.6 02/07/2024   HCT 40.6 02/07/2024   MCV 91.0 02/07/2024   MCH 30.5 02/07/2024   PLT 369 02/07/2024   MCHC 33.5 02/07/2024   RDW 13.5 02/07/2024   LYMPHSABS 1.3 02/07/2024   MONOABS 0.6 02/07/2024   EOSABS 0.2 02/07/2024   BASOSABS 0.0 02/07/2024     Last metabolic panel Lab Results  Component Value Date   NA 137 02/07/2024   K 3.0 (L) 02/07/2024   CL 99 02/07/2024   CO2 30 02/07/2024   BUN 14 02/07/2024   CREATININE 0.64 02/07/2024   GLUCOSE 108 (H) 02/07/2024   GFRNONAA >60 02/07/2024   GFRAA >60 03/21/2018   CALCIUM  9.0 02/07/2024   PROT 7.0 02/02/2024   ALBUMIN  3.7 02/02/2024   BILITOT 0.5 02/02/2024   ALKPHOS 74 02/02/2024   AST 108 (H) 02/02/2024   ALT 50 (H) 02/02/2024   ANIONGAP 8 02/07/2024    CBG (last 3)  No results for input(s): GLUCAP in the last 72 hours.    Coagulation Profile: Recent Labs  Lab 02/02/24 0437 02/02/24 0650  INR 4.0* 1.2     Radiology Studies: No results found.      Elgie Butter M.D. [facility] 02/07/2024, 11:57 AM  Available via Epic secure chat 7am-7pm After 7 pm, please refer to night coverage provider listed on amion.    "

## 2024-02-07 NOTE — Progress Notes (Addendum)
 PHARMACY - ANTICOAGULATION CONSULT NOTE  Pharmacy Consult for Heparin  Infusion Indication: factor V Leiden, left lower extremity DVT and right clavicle DVT   Allergies[1]  Patient Measurements: Height: (P) 5' 11 (180.3 cm) Weight: (P) 129.7 kg (286 lb) IBW/kg (Calculated) : (P) 75.3 HEPARIN  DW (KG): (P) 104.8  Vital Signs: Temp: 98 F (36.7 C) (01/13 0740) Temp Source: Oral (01/13 0324) BP: 128/71 (01/13 0740) Pulse Rate: 88 (01/13 0740)  Labs: Recent Labs    02/05/24 0707 02/06/24 0652 02/07/24 0628  HGB 14.6 14.8 13.6  HCT 44.3 45.3 40.6  PLT 351 362 369  HEPARINUNFRC 0.39 0.46 0.33  CREATININE 0.64 0.82 0.64    Estimated Creatinine Clearance: 131.5 mL/min (by C-G formula based on SCr of 0.64 mg/dL).  Assessment: 24 YOM s/p ex-lap on 1/08. Patient has factor V leiden and hx DVT, on warfarin which was reversed on 1/08 with vitamin K  and 4FPCC. Heparin  infusion consult ordered, no bolus.  Heparin  level is therapeutic x2 on 2200 units/hr. No bleeding noted, CBC stable, platelets 369.  Per confirmation with MD and surgery team, ok to resume warfarin today with heparin  infusion as bridge while INR subtherapeutic.  Received IV vitamin k  1/08 along with Kcentra , with resulting INR 1.2. Home regimen includes warfarin 7.5mg  daily.   Goal of Therapy:  Heparin  level 0.3-0.5 units/ml Monitor platelets by anticoagulation protocol: Yes   Plan:   Give warfarin 7.5 mg PO daily  Continue heparin  infusion at 2200 units/hr Check INR daily while on warfarin Check heparin  level daily while on heparin   Continue to monitor H&H and platelets   Thank you for allowing pharmacy to be a part of this patients care.  Shelba Collier, PharmD, BCPS Clinical Pharmacist      [1]  Allergies Allergen Reactions   Nsaids Other (See Comments)    Factor V Leiden

## 2024-02-07 NOTE — Plan of Care (Signed)
   Problem: Education: Goal: Knowledge of General Education information will improve Description: Including pain rating scale, medication(s)/side effects and non-pharmacologic comfort measures Outcome: Progressing   Problem: Elimination: Goal: Will not experience complications related to bowel motility Outcome: Progressing

## 2024-02-07 NOTE — Progress Notes (Signed)
 "  Progress Note  5 Days Post-Op  Subjective: Patient having some gas pains. Having bowel movements and passes flatus during bowel movements. Tolerating FLD. Denies n/v.   ROS  All negative with the exception of above.  Objective: Vital signs in last 24 hours: Temp:  [97.4 F (36.3 C)-98.5 F (36.9 C)] 98 F (36.7 C) (01/13 0740) Pulse Rate:  [74-88] 88 (01/13 0740) Resp:  [16-20] 20 (01/13 0740) BP: (104-128)/(59-86) 128/71 (01/13 0740) SpO2:  [92 %-100 %] 97 % (01/13 0740) Last BM Date : 02/05/24  Intake/Output from previous day: No intake/output data recorded. Intake/Output this shift: No intake/output data recorded.  PE: General: Pleasant male who is laying in bed in NAD. HEENT: Head is normocephalic, atraumatic. Heart: HR normal during encounter. Lungs: Respiratory effort nonlabored. Abd: Soft, ND. Appropriate generalized tenderness to palpation around wound. Midline incision with staples covered with honeycomb dressing. Honeycomb dressing removed. No active bleeding or discharge. No rebound tenderness or guarding.  Skin: Warm and dry.  Psych: A&Ox3 with an appropriate affect.    Lab Results:  Recent Labs    02/06/24 0652 02/07/24 0628  WBC 7.3 6.3  HGB 14.8 13.6  HCT 45.3 40.6  PLT 362 369   BMET Recent Labs    02/06/24 0652 02/07/24 0628  NA 139 137  K 3.0* 3.0*  CL 97* 99  CO2 29 30  GLUCOSE 109* 108*  BUN 16 14  CREATININE 0.82 0.64  CALCIUM  9.1 9.0   PT/INR No results for input(s): LABPROT, INR in the last 72 hours. CMP     Component Value Date/Time   NA 137 02/07/2024 0628   K 3.0 (L) 02/07/2024 0628   CL 99 02/07/2024 0628   CO2 30 02/07/2024 0628   GLUCOSE 108 (H) 02/07/2024 0628   BUN 14 02/07/2024 0628   CREATININE 0.64 02/07/2024 0628   CALCIUM  9.0 02/07/2024 0628   PROT 7.0 02/02/2024 0456   ALBUMIN  3.7 02/02/2024 0456   AST 108 (H) 02/02/2024 0456   ALT 50 (H) 02/02/2024 0456   ALKPHOS 74 02/02/2024 0456   BILITOT  0.5 02/02/2024 0456   GFRNONAA >60 02/07/2024 0628   GFRAA >60 03/21/2018 0623   Lipase     Component Value Date/Time   LIPASE 15 02/01/2024 1353       Studies/Results: No results found.  Anti-infectives: Anti-infectives (From admission, onward)    Start     Dose/Rate Route Frequency Ordered Stop   02/02/24 0630  ceFAZolin  (ANCEF ) IVPB 3g/150 mL premix        3 g 300 mL/hr over 30 Minutes Intravenous On call to O.R. 02/02/24 0625 02/02/24 0935        Assessment/Plan POD5: S/P exploratory laparotomy by Dr. Rubin on 1/8 -Afebrile. -CBC WNL -Exam without significant concerns. -Pain manageable. Tolerating FLD. No n/v. Having BM and flatulence. -Will advance diet to Soft. If tolerates soft diet, he can advance as tolerated. -If tolerates advancement of diet, okay for discharge once medically stable. Discussed post-operative expectations and restrictions in great detail. Outpatient nurse visit for staple removal and follow up with Dr. Rubin has been arranged.    FEN: Soft; IVF per primary team VTE: SCDs; Heparin  infusion ID: None currently   LOS: 5 days   I reviewed specialist notes, hospitalist notes, nursing notes, last 24 h vitals and pain scores, last 48 h intake and output, last 24 h labs and trends, and last 24 h imaging results.  Marjorie Carlyon Favre, PA-C  [facility] 02/07/2024, 8:57 AM Please see Amion for pager number during day hours 7:00am-4:30pm  "

## 2024-02-07 NOTE — Discharge Instructions (Signed)
 " POST OP INSTRUCTIONS AFTER COLON SURGERY  DIET: Be sure to include lots of fluids daily to stay hydrated - 64oz of water per day (8, 8 oz glasses).  Avoid fast food or heavy meals for the first couple of weeks as your are more likely to get nauseated. Avoid raw/uncooked fruits or vegetables for the first 4 weeks (its ok to have these if they are blended into smoothie form). If you have fruits/vegetables, make sure they are cooked until soft enough to mash on the roof of your mouth and chew your food well. Otherwise, diet as tolerated.  Take your usually prescribed home medications unless otherwise directed.  PAIN CONTROL: Pain is best controlled by a usual combination of three different methods TOGETHER: Ice/Heat Over the counter pain medication Prescription pain medication Most patients will experience some swelling and bruising around the surgical site.  Ice packs or heating pads (30-60 minutes up to 6 times a day) will help. Some people prefer to use ice alone, heat alone, alternating between ice & heat.  Experiment to what works for you.  Swelling and bruising can take several weeks to resolve.   It is helpful to take an over-the-counter pain medication regularly for the first few weeks: Ibuprofen (Motrin/Advil) - 200mg  tabs - take 3 tabs (600mg ) every 6 hours as needed for pain (unless you have been directed previously to avoid NSAIDs/ibuprofen). Do not take on a empty stomach.  Acetaminophen  (Tylenol ) - you may take 650mg  every 6 hours as needed. You can take this with motrin as they act differently on the body. If you are taking a narcotic pain medication that has acetaminophen  in it, do not take over the counter tylenol  at the same time. NOTE: You may take both of these medications together - most patients  find it most helpful when alternating between the two (i.e. Ibuprofen at 6am, tylenol  at 9am, ibuprofen at 12pm ..SABRA) A  prescription for pain medication will be sent electronically to a  pharmacy upon discharge.  Take your pain medication as prescribed if your pain is not adequatly controlled with the over-the-counter pain reliefs mentioned above.  Avoid getting constipated.  Between the surgery and the pain medications, it is common to experience some constipation.  Increasing fluid intake and taking a fiber supplement (such as Metamucil, Citrucel, FiberCon, MiraLax , etc) 1-2 times a day regularly will usually help prevent this problem from occurring.  A mild laxative (prune juice, Milk of Magnesia, MiraLax , etc) should be taken according to package directions if there are no bowel movements after 48 hours.    Dressing/Wound care:  Unless discharge instructions indicate otherwise, follow guidelines below  If you have STERI-STRIPS - you may remove your outer bandages 48 hours after surgery, and you may shower at that time.  You have steri-strips (small skin tapes) in place directly over the incision.  These strips should be left on the skin for 7-10 days.   If you have DERMABOND/SKIN GLUE - you may shower in 24 hours.  The glue will flake off over the next 2-3 weeks. If you have SKIN STAPLES - Any sutures or staples will be removed at the office during your follow-up visit.  Sometimes you may given a separate appointment with a [facility] nurse to remove the skin staples about 10-14 days after your surgery.  May cover incisions/staples with dry gauze and tape. May shower. Incisions can get wet in shower.   Avoid baths/pools/lakes/oceans until your wounds have fully healed.  ACTIVITIES  as tolerated:   Avoid heavy lifting (>10lbs or 1 gallon of milk) for the next 6 weeks. You may resume regular daily activities as tolerated--such as daily self-care, walking, climbing stairs--gradually increasing activities as tolerated.  If you can walk 30 minutes without difficulty, it is safe to try more intense activity such as jogging, treadmill, bicycling, low-impact aerobics.  DO NOT PUSH THROUGH PAIN.   Let pain be your guide: If it hurts to do something, don't do it. You may drive when you are no longer taking prescription pain medication, you can comfortably wear a seatbelt, and you can safely maneuver your car and apply brakes.  FOLLOW UP in our office Please call [facility] at [phone] to set up an appointment to see your surgeon in the office for a follow-up appointment approximately 2-3 weeks after your surgery. Make sure that you call for this appointment during a weekday if your follow-up appointment was listed on your discharge papers from the hospital If you have staples, you may be given a separate appointment with a [facility] nurse office to remove the staples 10-14 days after your surgery.    8. If you have disability or family leave forms that need to be completed related to your surgery, you may drop them off at our office; for return to work instructions, please ask our office staff and they will be happy to assist you in obtaining this documentation   When to call us  [phone]: Poor pain control Reactions / problems with new medications (rash/itching, etc)  Fever over 101.5 F (38.5 C) Inability to urinate Nausea/vomiting Worsening swelling or bruising Continued bleeding from incision. Increased pain, redness, or drainage from the incision  The clinic staff is available to answer your questions during regular business hours (8:30am-5pm).  Please dont hesitate to call and ask to speak to one of our nurses for clinical concerns.   A surgeon from [facility] is always on call at the hospitals   If you have a medical emergency, go to the nearest emergency room or call 911.  [facility],  A [facility] [address], [place], [place]  [address] MAIN: [phone] FAX: [phone]  "

## 2024-02-08 ENCOUNTER — Other Ambulatory Visit (HOSPITAL_COMMUNITY): Payer: Self-pay

## 2024-02-08 ENCOUNTER — Telehealth (HOSPITAL_COMMUNITY): Payer: Self-pay | Admitting: Pharmacy Technician

## 2024-02-08 LAB — BASIC METABOLIC PANEL WITH GFR
Anion gap: 8 (ref 5–15)
BUN: 12 mg/dL (ref 8–23)
CO2: 29 mmol/L (ref 22–32)
Calcium: 9.4 mg/dL (ref 8.9–10.3)
Chloride: 102 mmol/L (ref 98–111)
Creatinine, Ser: 0.68 mg/dL (ref 0.61–1.24)
GFR, Estimated: >60 mL/min
Glucose, Bld: 115 mg/dL — ABNORMAL HIGH (ref 70–99)
Potassium: 4.2 mmol/L (ref 3.5–5.1)
Sodium: 139 mmol/L (ref 135–145)

## 2024-02-08 LAB — CBC WITH DIFFERENTIAL/PLATELET
Abs Immature Granulocytes: 0.03 K/uL (ref 0.00–0.07)
Basophils Absolute: 0 K/uL (ref 0.0–0.1)
Basophils Relative: 0 %
Eosinophils Absolute: 0.1 K/uL (ref 0.0–0.5)
Eosinophils Relative: 2 %
HCT: 41.5 % (ref 39.0–52.0)
Hemoglobin: 13.8 g/dL (ref 13.0–17.0)
Immature Granulocytes: 0 %
Lymphocytes Relative: 16 %
Lymphs Abs: 1.3 K/uL (ref 0.7–4.0)
MCH: 30.5 pg (ref 26.0–34.0)
MCHC: 33.3 g/dL (ref 30.0–36.0)
MCV: 91.6 fL (ref 80.0–100.0)
Monocytes Absolute: 0.7 K/uL (ref 0.1–1.0)
Monocytes Relative: 8 %
Neutro Abs: 5.6 K/uL (ref 1.7–7.7)
Neutrophils Relative %: 74 %
Platelets: 403 K/uL — ABNORMAL HIGH (ref 150–400)
RBC: 4.53 MIL/uL (ref 4.22–5.81)
RDW: 13.3 % (ref 11.5–15.5)
WBC: 7.7 K/uL (ref 4.0–10.5)
nRBC: 0 % (ref 0.0–0.2)

## 2024-02-08 LAB — PROTIME-INR
INR: 1 (ref 0.8–1.2)
Prothrombin Time: 13.9 s (ref 11.4–15.2)

## 2024-02-08 LAB — HEPARIN LEVEL (UNFRACTIONATED): Heparin Unfractionated: 0.31 [IU]/mL (ref 0.30–0.70)

## 2024-02-08 MED ORDER — ENOXAPARIN SODIUM 150 MG/ML IJ SOSY
130.0000 mg | PREFILLED_SYRINGE | Freq: Two times a day (BID) | INTRAMUSCULAR | Status: DC
  Administered 2024-02-08: 130 mg via SUBCUTANEOUS
  Filled 2024-02-08 (×2): qty 0.86

## 2024-02-08 MED ORDER — ENOXAPARIN SODIUM 100 MG/ML IJ SOSY
130.0000 mg | PREFILLED_SYRINGE | Freq: Two times a day (BID) | INTRAMUSCULAR | 0 refills | Status: AC
  Filled 2024-02-08: qty 28, 7d supply, fill #0

## 2024-02-08 NOTE — TOC Initial Note (Addendum)
 Transition of Care [facility]) - Initial/Assessment Note    Patient Details  Name: [name] MRN: [id number] Date of Birth: 1960-04-12  Transition of [facility]) CM/SW Contact:    Rosalva Jon Bloch, RN Phone Number: 02/08/2024, 10:51 AM  Clinical Narrative:                 Presents with abdominal pain, nausea, and vomiting.    - internal hernia versus volvulus , s/p LAPAROTOMY, EXPLORATORY 1/8 From home with roommate. PTA independent with ADL's. DME : RW, W/C, SCOOTER @ home. Consult received: Patient will need a HH lab check for INR every 2 days until his INR is at 2.0 or > . Pt agreeable to home health services . Pt without provider preference. [facility] accepted for home health provider.  Pt will need home health PT,OT and RN orders / face to face from MD  for home health services . Pt states he has no problems with transportation to office visits. States he uses [employer], INSURANCE ( 12 free rides) or roommate to assist.  Pt without RX med concerns.  Pt states roommate to provide transportation to home once d/c ready. Post hospital f/u arranged by NCM and noted on AVS. Discharge summary will need to be faxed to [phone] @ d/c to PCP.  ICM team following and will continue assisting with needs...  Expected Discharge Plan: Home w Home Health Services Barriers to Discharge: Continued Medical Work up   Patient Goals and CMS Choice     Choice offered to / list presented to : Patient      Expected Discharge Plan and Services   Discharge Planning Services: CM Consult Post Acute Care Choice: Home Health Living arrangements for the past 2 months: Single Family Home                           HH Arranged: RN, PT [facility] Agency: [facility] Care Date [facility] Agency Contacted: 02/08/24 Time HH Agency Contacted: 1050 Representative spoke with at [facility] Agency: [place]  Prior Living Arrangements/Services Living arrangements for the past 2 months: Single Family Home Lives with::  Roommate Patient language and need for interpreter reviewed:: Yes Do you feel safe going back to the place where you live?: Yes      Need for Family Participation in Patient Care: Yes (Comment) Care giver support system in place?: Yes (comment)   Criminal Activity/Legal Involvement Pertinent to Current Situation/Hospitalization: No - Comment as needed  Activities of Daily Living   ADL Screening (condition at time of admission) Independently performs ADLs?: Yes (appropriate for developmental age) Is the patient deaf or have difficulty hearing?: No Does the patient have difficulty seeing, even when wearing glasses/contacts?: No Does the patient have difficulty concentrating, remembering, or making decisions?: No  Permission Sought/Granted                  Emotional Assessment Appearance:: Appears stated age Attitude/Demeanor/Rapport: Engaged Affect (typically observed): Accepting Orientation: : Oriented to Self, Oriented to Place, Oriented to  Time, Oriented to Situation   Psych Involvement: No (comment)  Admission diagnosis:  Small bowel obstruction (HCC) [K56.609] SBO (small bowel obstruction) (HCC) [K56.609] Patient Active Problem List   Diagnosis Date Noted   SBO (small bowel obstruction) (HCC) 02/02/2024   BKA stump complication (HCC) 11/18/2023   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 (HCC) 11/18/2023  History of left below knee amputation (HCC) 05/31/2022   Abrasion, right knee, initial encounter    Wound infection 03/15/2018   Cellulitis of right anterior lower leg 03/15/2018   History of pulmonary embolus (PE) 03/15/2018   Cellulitis 03/14/2018   S/P ankle fusion 04/20/2017   Post-traumatic osteoarthritis, left ankle and foot    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 [place] [address] Phone: [phone] Fax: [phone]     Social Drivers of Health (SDOH) Social History: SDOH Screenings   Food Insecurity: No Food Insecurity (02/02/2024)  Recent Concern: Food Insecurity - Food Insecurity Present (11/18/2023)  Housing: Low Risk (02/02/2024)  Transportation Needs: No Transportation Needs (02/02/2024)  Utilities: Not At Risk (02/02/2024)  Social Connections: Unknown (02/02/2024)  Recent Concern: Social Connections - Socially Isolated (11/18/2023)  Tobacco Use: Medium Risk (02/02/2024)   SDOH Interventions: Food Insecurity Interventions: Programmer, Applications Provided Social Connections Interventions: Community Resources Provided   Readmission Risk Interventions     No data to display

## 2024-02-08 NOTE — Progress Notes (Signed)
 "  Progress Note  6 Days Post-Op  Subjective: Stable gas pains but otherwise no abdominal pain. No PRN pain medications yesterday. He is tolerating soft diet without n/v. Passing flatus. BM yesterday. Voiding. Oob yesterday. Had some blood tinged drainage from midline wound yesterday that has stopped today.   Afebrile. No tachycardia or hypotension. WBC wnl. Hgb stable. Cr wnl. K 4.2.   Objective: Vital signs in last 24 hours: Temp:  [98 F (36.7 C)-98.2 F (36.8 C)] 98 F (36.7 C) (01/14 0733) Pulse Rate:  [73-77] 77 (01/14 0733) Resp:  [18-19] 18 (01/14 0733) BP: (117-132)/(69-92) 132/69 (01/14 0733) SpO2:  [93 %-94 %] 94 % (01/14 0733) Last BM Date : 02/07/24  Intake/Output from previous day: 01/13 0701 - 01/14 0700 In: 1080 [P.O.:1080] Out: -  Intake/Output this shift: Total I/O In: 1918.6 [I.V.:1918.6] Out: -   PE: General: Pleasant male who is laying in bed in NAD. Abd: Soft, ND, NT, midline wound with dressing covering with some dried blood on dressing, dressing removed and midline wound with staples in place without active bleeding, drainage, surrounding skin changes or signs of infection.   Lab Results:  Recent Labs    02/07/24 0628 02/08/24 0538  WBC 6.3 7.7  HGB 13.6 13.8  HCT 40.6 41.5  PLT 369 403*   BMET Recent Labs    02/07/24 0628 02/08/24 0538  NA 137 139  K 3.0* 4.2  CL 99 102  CO2 30 29  GLUCOSE 108* 115*  BUN 14 12  CREATININE 0.64 0.68  CALCIUM  9.0 9.4   PT/INR Recent Labs    02/08/24 0538  LABPROT 13.9  INR 1.0   CMP     Component Value Date/Time   NA 139 02/08/2024 0538   K 4.2 02/08/2024 0538   CL 102 02/08/2024 0538   CO2 29 02/08/2024 0538   GLUCOSE 115 (H) 02/08/2024 0538   BUN 12 02/08/2024 0538   CREATININE 0.68 02/08/2024 0538   CALCIUM  9.4 02/08/2024 0538   PROT 7.0 02/02/2024 0456   ALBUMIN  3.7 02/02/2024 0456   AST 108 (H) 02/02/2024 0456   ALT 50 (H) 02/02/2024 0456   ALKPHOS 74 02/02/2024 0456    BILITOT 0.5 02/02/2024 0456   GFRNONAA >60 02/08/2024 0538   GFRAA >60 03/21/2018 0623   Lipase     Component Value Date/Time   LIPASE 15 02/01/2024 1353       Studies/Results: No results found.  Anti-infectives: Anti-infectives (From admission, onward)    Start     Dose/Rate Route Frequency Ordered Stop   02/02/24 0630  ceFAZolin  (ANCEF ) IVPB 3g/150 mL premix        3 g 300 mL/hr over 30 Minutes Intravenous On call to O.R. 02/02/24 0625 02/02/24 0935        Assessment/Plan POD6 S/P exploratory laparotomy by Dr. Rubin on 1/8 - Intra-op findings: Patient with significant amount of adhesions to the right lower quadrant area to previous ostomy site. These were taken down sharply. It appeared that there was some small bowel that was underneath this area of adhesions. Small bowel was run from ligament of Treitz distally to the ileocolonic anastomosis in the left upper quadrant area. There is no significant decompression portion of the small intestine. There may be some portion of dysmotility of his GI tract.   - Patient tolerating soft diet, having bowel function, pain controlled and midline wound cdi. Okay for d/c from our standpoint. F/u arranged. Discussed discharge instructions, restrictions and return/call  back precautions. Please call back with any questions or concerns.   FEN: Soft; IVF per primary team VTE: SCDs; okay to transition back to warfarin at d/c ID: None currently   LOS: 6 days    Ozell CHRISTELLA Shaper, [facility] 02/08/2024, 9:10 AM Please see Amion for pager number during day hours 7:00am-4:30pm  "

## 2024-02-08 NOTE — Plan of Care (Signed)
" °  Problem: Education: Goal: Knowledge of the prescribed therapeutic regimen will improve 02/08/2024 0602 by Lenette Ezzie Saha A, RN Outcome: Progressing   Problem: Bowel/Gastric: Goal: Gastrointestinal status for postoperative course will improve 02/08/2024 0602 by Lenette Ezzie Lyn A, RN Outcome: Progressing   Problem: Cardiac: Goal: Ability to maintain an adequate cardiac output Outcome: Progressing   Problem: Clinical Measurements: Goal: Ability to maintain clinical measurements within normal limits 02/08/2024 0602 by Lenette Ezzie Saha A, RN Outcome: Progressing   Problem: Pain Managment: Goal: General experience of comfort will improve and/or be controlled Outcome: Progressing   "

## 2024-02-08 NOTE — Telephone Encounter (Signed)
 Patient Product/process Development Scientist completed.    The patient is insured through [facility]. Patient has Medicare and is not eligible for a copay card, but may be able to apply for patient assistance or Medicare RX Payment Plan (Patient Must reach out to their plan, if eligible for payment plan), if available.    Ran test claim for enoxaparin  120 mg and the current 7 day co-pay is $5.10.   This test claim was processed through [facility]- copay amounts may vary at other pharmacies due to pharmacy/plan contracts, or as the patient moves through the different stages of their insurance plan.     Reyes Sharps, CPHT Pharmacy Technician Patient Advocate Specialist Lead [facility] Patient Advocate Team Direct Number: [phone]  Fax: [phone]

## 2024-02-08 NOTE — Progress Notes (Addendum)
 Discharge Nurse Summary: DC order noted per MD. DC RN at bedside with patient. Patient agreeable with discharge plan, states friend will arrive soon for pickup. AVS printed/reviewed. PIVs removed, skin intact. No DME needs. No home meds. TOC meds delivered to the patient. CP/Edu resolved. Telemonitor not present on assessment. All belongings accounted for. Dressing to wound slightly open near umbilicus. Dressing secured with transparent dressing, CDI w/o bleeding or drainage. See LDAs. Patient wheeled downstairs for discharge by private auto.   Rosario EMERSON Lund, RN

## 2024-02-08 NOTE — Progress Notes (Addendum)
 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  Assessment: 72 YOM s/p ex-lap on 1/08. Patient has factor V leiden and hx DVT, on warfarin which was reversed on 1/08 with vitamin K  and 4FPCC. Heparin  infusion consult ordered, no bolus.  Per confirmation with MD and surgery team, ok to resume warfarin today with heparin  infusion as bridge while INR subtherapeutic.  Received IV vitamin k  1/08 along with Kcentra , with resulting INR 1.2. Home regimen includes warfarin 7.5mg  daily. INR 1.0 following x1 dose warfarin as expected, CBC remains stable. Heparin  level is therapeutic x2 on 2200 units/hr.   Update - ok to transition to therapeutic enoxaparin  while INR subtherapeutic.   Goal of Therapy:  Heparin  level 0.3-0.5 units/ml Monitor platelets by anticoagulation protocol: Yes   Plan:   Give warfarin 7.5 mg PO x1 Stop heparin  infusion, start enoxaparin  130mg  SQ Q12h while INR subtherapeutic Check INR daily while on warfarin  Continue to monitor H&H and platelets   Thank you for allowing pharmacy to be a part of this patients care.  Shelba Collier, PharmD, BCPS Clinical Pharmacist     [1]  Allergies Allergen Reactions   Nsaids Other (See Comments)    Factor  V Leiden

## 2024-02-08 NOTE — Discharge Summary (Signed)
 "  DISCHARGE SUMMARY  [name]  MR#: [id number]  DOB:Jun 03, 1960  Date of Admission: 02/01/2024 Date of Discharge: 02/08/2024  Attending Physician:Torien Ramroop ONEIDA Moores, MD  Patient's PCP:Lee, Pat, MD  Disposition: D/C home   Follow-up Appts:  Contact information for follow-up providers     [facility], [place]. Go on 02/14/2024.   Specialty: General Surgery Why: Nurse visit at 9:00AM, Arrive 30 mins prior to scheduled appointment time, Please bring insurance cards and ID to appointment. Contact information: [address] [place]  [address] [phone]        Rubin Calamity, MD. Go on 02/23/2024.   Specialty: General Surgery Why: At 9:00AM with Dr. Rubin. Arrive 30 mins prior to scheduled appointment time, Please bring insurance cards and ID to appointment. Contact information: [address] [place] [address] [phone]         Pat Ruth, MD, [facility] Follow up on 02/14/2024.   Why: Post hospital follow up scheduled for 02/14/2024 at 3:00pm with PCP. Contact information: [address] [place], [place] [address] Phone: [phone]             Contact information for after-discharge care     Home Medical Care     [facility]) .   Service: Home Health Services Why: Home health services will be provided by [facility], start of care within 48 hours post discharge. Contact information: [address] [place]  [address] [phone]        [facility]) .   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  He was able to be discharged 01/27/2024 but after returning home experienced the abrupt return of abdominal pain with nausea vomiting and some watery stools.  He presented to the [facility] ER where a CT scan was concerning for possible internal hernia versus obstruction.  He was admitted to the medical service with general surgery consultation, and was taken to the OR 1/08 for an exploratory laparotomy with significant sharp lysis of adhesions required.  Hospital Course:  Small bowel obstruction Status post exploratory laparotomy with extensive LOA -  postoperative care per general surgery -cleared for discharge by general surgery w/ outpt f/u arranged   Hypokalemia Corrected with supplementation  Factor V Leiden mutation -history of LLE DVT and right subclavicular DVT Continue warfarin w/ lovenox  bridge as outpt - HHRN arranged for every other day PT/INR checks - stop lovenox  once INR 2.0 or >  Obesity - Body mass index is 39.89 kg/m (pended).  BPH Continue Flomax   Depression Continue Prozac  without change  HLD Continue usual statin dose  Allergies as of 02/08/2024       Reactions   Nsaids Other (See Comments)  Factor V Leiden        Medication List     TAKE these medications    rOPINIRole  0.5 MG tablet Commonly known as: REQUIP  Take 0.5 mg by mouth at bedtime. The timing of this medication is very important.   Acetaminophen  8 Hour 650 MG CR tablet Generic drug: acetaminophen  Take 650 mg by mouth every 8 (eight) hours as needed for pain.   atorvastatin  20 MG tablet Commonly known as: LIPITOR Take 20 mg by mouth daily.   busPIRone  15 MG  tablet Commonly known as: BUSPAR  Take 15 mg by mouth 3 (three) times daily.   cariprazine  3 MG capsule Commonly known as: VRAYLAR  Take 3 mg by mouth in the morning.   enoxaparin  150 MG/ML injection Commonly known as: LOVENOX  Inject 0.86 mLs (130 mg total) into the skin 2 (two) times daily.   FLUoxetine  40 MG capsule Commonly known as: PROZAC  Take 40 mg by mouth daily.   gabapentin  100 MG capsule Commonly known as: NEURONTIN  Take 200 mg by mouth 3 (three) times daily.   tamsulosin  0.4 MG Caps capsule Commonly known as: FLOMAX  Take 0.4 mg by mouth at bedtime.   temazepam  30 MG capsule Commonly known as: RESTORIL  Take 30 mg by mouth at bedtime.   traZODone  100 MG tablet Commonly known as: DESYREL  Take 200 mg by mouth at bedtime.   warfarin 5 MG tablet Commonly known as: COUMADIN  Take 7.5 mg by mouth at bedtime.        Day of Discharge BP 132/69 (BP Location: Left Arm)   Pulse 77   Temp 98 F (36.7 C)   Resp 18   Ht (P) 5' 11 (1.803 m)   Wt (P) 129.7 kg   SpO2 94%   BMI (P) 39.89 kg/m   Physical Exam: General: No acute respiratory distress Lungs: Clear to auscultation bilaterally without wheezes or crackles Cardiovascular: Regular rate and rhythm without murmur gallop or rub normal S1 and S2 Abdomen: Nontender, nondistended, soft, bowel sounds positive, no rebound, no ascites, no appreciable mass Extremities: No significant cyanosis, clubbing, or edema bilateral lower extremities  Basic Metabolic Panel: Recent Labs  Lab 02/03/24 0548 02/05/24 0707 02/05/24 1527 02/06/24 0652 02/06/24 2003 02/07/24 0628 02/08/24 0538  NA 140 141  --  139  --  137 139  K 3.6 3.3*  --  3.0*  --  3.0* 4.2  CL 103 99  --  97*  --  99 102  CO2 30 30  --  29  --  30 29  GLUCOSE 115* 111*  --  109*  --  108* 115*  BUN 10 12  --  16  --  14 12  CREATININE 0.58* 0.64  --  0.82  --  0.64 0.68  CALCIUM  8.7* 9.2  --  9.1  --  9.0 9.4  MG  --   --  2.2  --  2.0  --   --      CBC: Recent Labs  Lab 02/01/24 1353 02/02/24 0437 02/04/24 0524 02/05/24 0707 02/06/24 0652 02/07/24 0628 02/08/24 0538  WBC 13.2*   < > 11.1* 10.5 7.3 6.3 7.7  NEUTROABS 10.5*  --   --   --  4.8 4.2 5.6  HGB 14.5   < > 13.1 14.6 14.8 13.6 13.8  HCT 43.1   < > 40.2 44.3 45.3 40.6 41.5  MCV 91.9   < > 93.3 92.5 92.4 91.0 91.6  PLT 213   < >  273 351 362 369 403*   < > = values in this interval not displayed.    Time spent in discharge (includes decision making & examination of pt): 35 minutes  02/08/2024, 12:30 PM   Reyes IVAR Moores, MD [facility] Office  [phone]      "
